# Patient Record
Sex: Male | Born: 1940
Health system: Southern US, Community
[De-identification: ages and names within clinical notes are randomized; demographics above are authoritative.]

## PROBLEM LIST (undated history)

## (undated) DIAGNOSIS — I10 Essential (primary) hypertension: Secondary | ICD-10-CM

## (undated) DIAGNOSIS — E119 Type 2 diabetes mellitus without complications: Secondary | ICD-10-CM

## (undated) DIAGNOSIS — H269 Unspecified cataract: Secondary | ICD-10-CM

## (undated) DIAGNOSIS — N529 Male erectile dysfunction, unspecified: Secondary | ICD-10-CM

## (undated) DIAGNOSIS — I447 Left bundle-branch block, unspecified: Secondary | ICD-10-CM

## (undated) DIAGNOSIS — Z87442 Personal history of urinary calculi: Secondary | ICD-10-CM

## (undated) DIAGNOSIS — E559 Vitamin D deficiency, unspecified: Secondary | ICD-10-CM

## (undated) DIAGNOSIS — R911 Solitary pulmonary nodule: Secondary | ICD-10-CM

## (undated) DIAGNOSIS — K219 Gastro-esophageal reflux disease without esophagitis: Secondary | ICD-10-CM

## (undated) DIAGNOSIS — N183 Chronic kidney disease, stage 3 unspecified: Secondary | ICD-10-CM

## (undated) DIAGNOSIS — C801 Malignant (primary) neoplasm, unspecified: Secondary | ICD-10-CM

## (undated) DIAGNOSIS — R7303 Prediabetes: Secondary | ICD-10-CM

## (undated) DIAGNOSIS — K589 Irritable bowel syndrome without diarrhea: Secondary | ICD-10-CM

## (undated) DIAGNOSIS — E785 Hyperlipidemia, unspecified: Secondary | ICD-10-CM

## (undated) DIAGNOSIS — N400 Enlarged prostate without lower urinary tract symptoms: Secondary | ICD-10-CM

## (undated) HISTORY — DX: Left bundle-branch block, unspecified: I44.7

## (undated) HISTORY — DX: Male erectile dysfunction, unspecified: N52.9

## (undated) HISTORY — DX: Hyperlipidemia, unspecified: E78.5

## (undated) HISTORY — DX: Unspecified cataract: H26.9

## (undated) HISTORY — DX: Vitamin D deficiency, unspecified: E55.9

## (undated) HISTORY — DX: Irritable bowel syndrome, unspecified: K58.9

## (undated) HISTORY — DX: Benign prostatic hyperplasia without lower urinary tract symptoms: N40.0

## (undated) HISTORY — DX: Personal history of urinary calculi: Z87.442

## (undated) HISTORY — DX: Type 2 diabetes mellitus without complications: E11.9

## (undated) HISTORY — DX: Gastro-esophageal reflux disease without esophagitis: K21.9

## (undated) HISTORY — DX: Prediabetes: R73.03

## (undated) HISTORY — DX: Essential (primary) hypertension: I10

## (undated) HISTORY — PX: POLYPECTOMY: SHX149

## (undated) HISTORY — PX: KIDNEY STONE SURGERY: SHX686

---

## 1999-07-19 ENCOUNTER — Other Ambulatory Visit: Admission: RE | Admit: 1999-07-19 | Discharge: 1999-07-19 | Payer: Self-pay | Admitting: Gastroenterology

## 1999-07-19 ENCOUNTER — Encounter (INDEPENDENT_AMBULATORY_CARE_PROVIDER_SITE_OTHER): Payer: Self-pay

## 2001-06-27 ENCOUNTER — Encounter: Payer: Self-pay | Admitting: Internal Medicine

## 2001-06-27 ENCOUNTER — Ambulatory Visit (HOSPITAL_COMMUNITY): Admission: RE | Admit: 2001-06-27 | Discharge: 2001-06-27 | Payer: Self-pay | Admitting: *Deleted

## 2002-02-09 ENCOUNTER — Observation Stay (HOSPITAL_COMMUNITY): Admission: RE | Admit: 2002-02-09 | Discharge: 2002-02-10 | Payer: Self-pay | Admitting: Urology

## 2002-02-09 ENCOUNTER — Encounter: Payer: Self-pay | Admitting: Urology

## 2002-02-10 ENCOUNTER — Encounter: Payer: Self-pay | Admitting: Urology

## 2002-02-12 ENCOUNTER — Encounter: Payer: Self-pay | Admitting: Urology

## 2002-02-12 ENCOUNTER — Encounter: Admission: RE | Admit: 2002-02-12 | Discharge: 2002-02-12 | Payer: Self-pay | Admitting: Urology

## 2002-03-04 ENCOUNTER — Encounter: Payer: Self-pay | Admitting: Urology

## 2002-03-04 ENCOUNTER — Ambulatory Visit (HOSPITAL_BASED_OUTPATIENT_CLINIC_OR_DEPARTMENT_OTHER): Admission: RE | Admit: 2002-03-04 | Discharge: 2002-03-04 | Payer: Self-pay | Admitting: Urology

## 2002-03-19 ENCOUNTER — Encounter: Payer: Self-pay | Admitting: Urology

## 2002-03-19 ENCOUNTER — Encounter: Admission: RE | Admit: 2002-03-19 | Discharge: 2002-03-19 | Payer: Self-pay | Admitting: Urology

## 2009-10-15 ENCOUNTER — Ambulatory Visit (HOSPITAL_COMMUNITY): Admission: RE | Admit: 2009-10-15 | Discharge: 2009-10-15 | Payer: Self-pay | Admitting: Internal Medicine

## 2011-01-21 IMAGING — CR DG HAND COMPLETE 3+V*L*
3 series · 3 of 3 positions shown · non-contrast
Comparison: None

CLINICAL DATA: And the smash between to projects 4 days ago with
pain, bruising and swelling over the second fourth metacarpals
posteriorly.

LEFT HAND - COMPLETE 3+ VIEW

[view not recorded (1 of 3)]
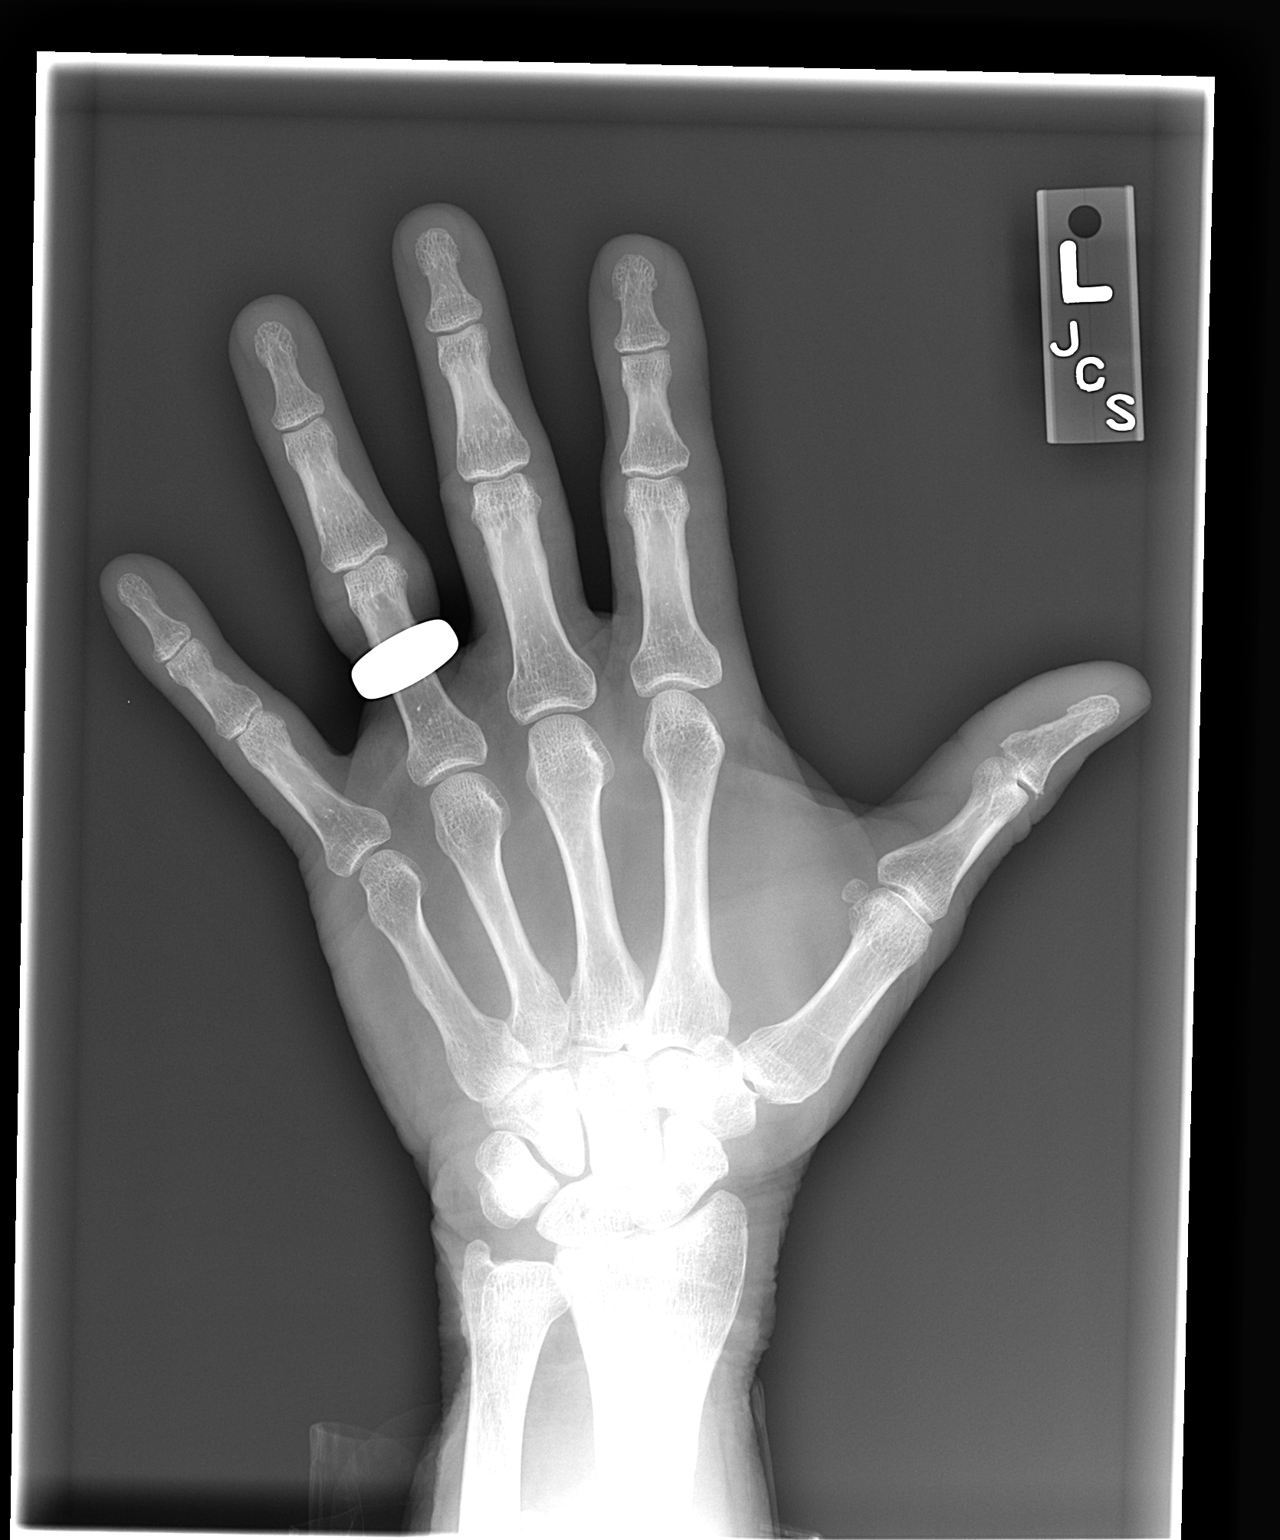

[view not recorded (2 of 3)]
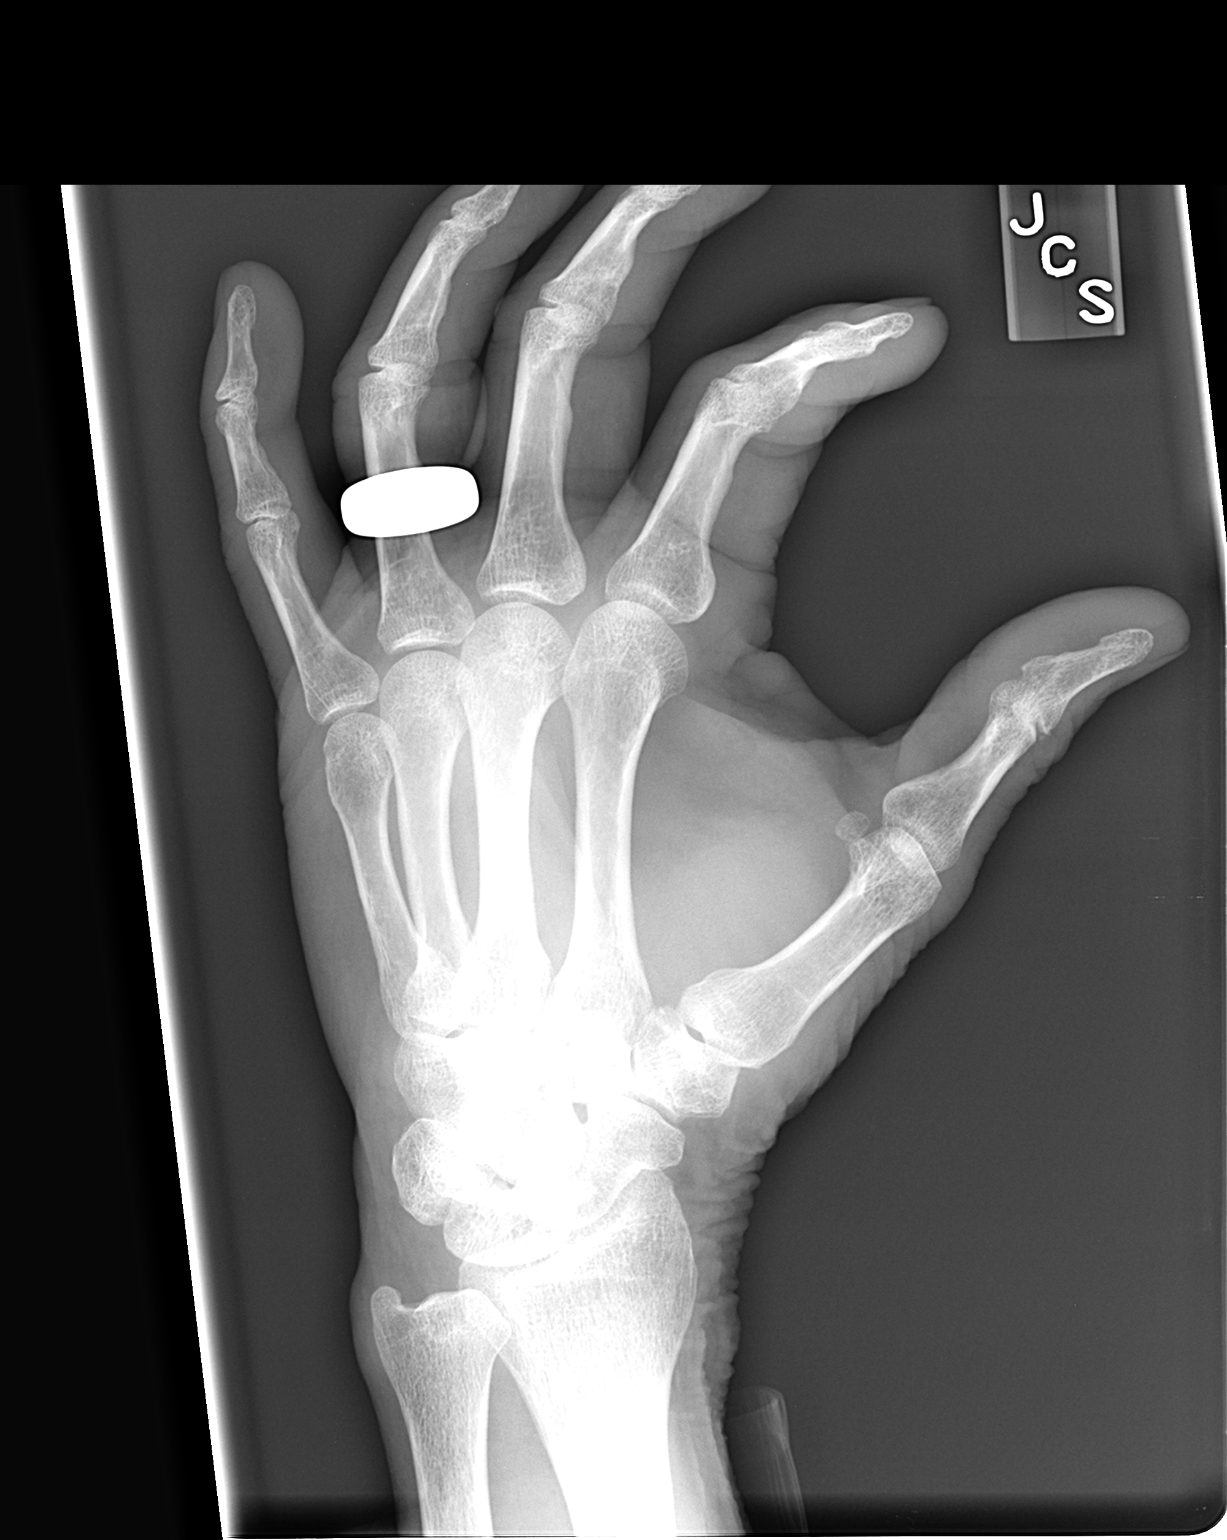

[view not recorded (3 of 3)]
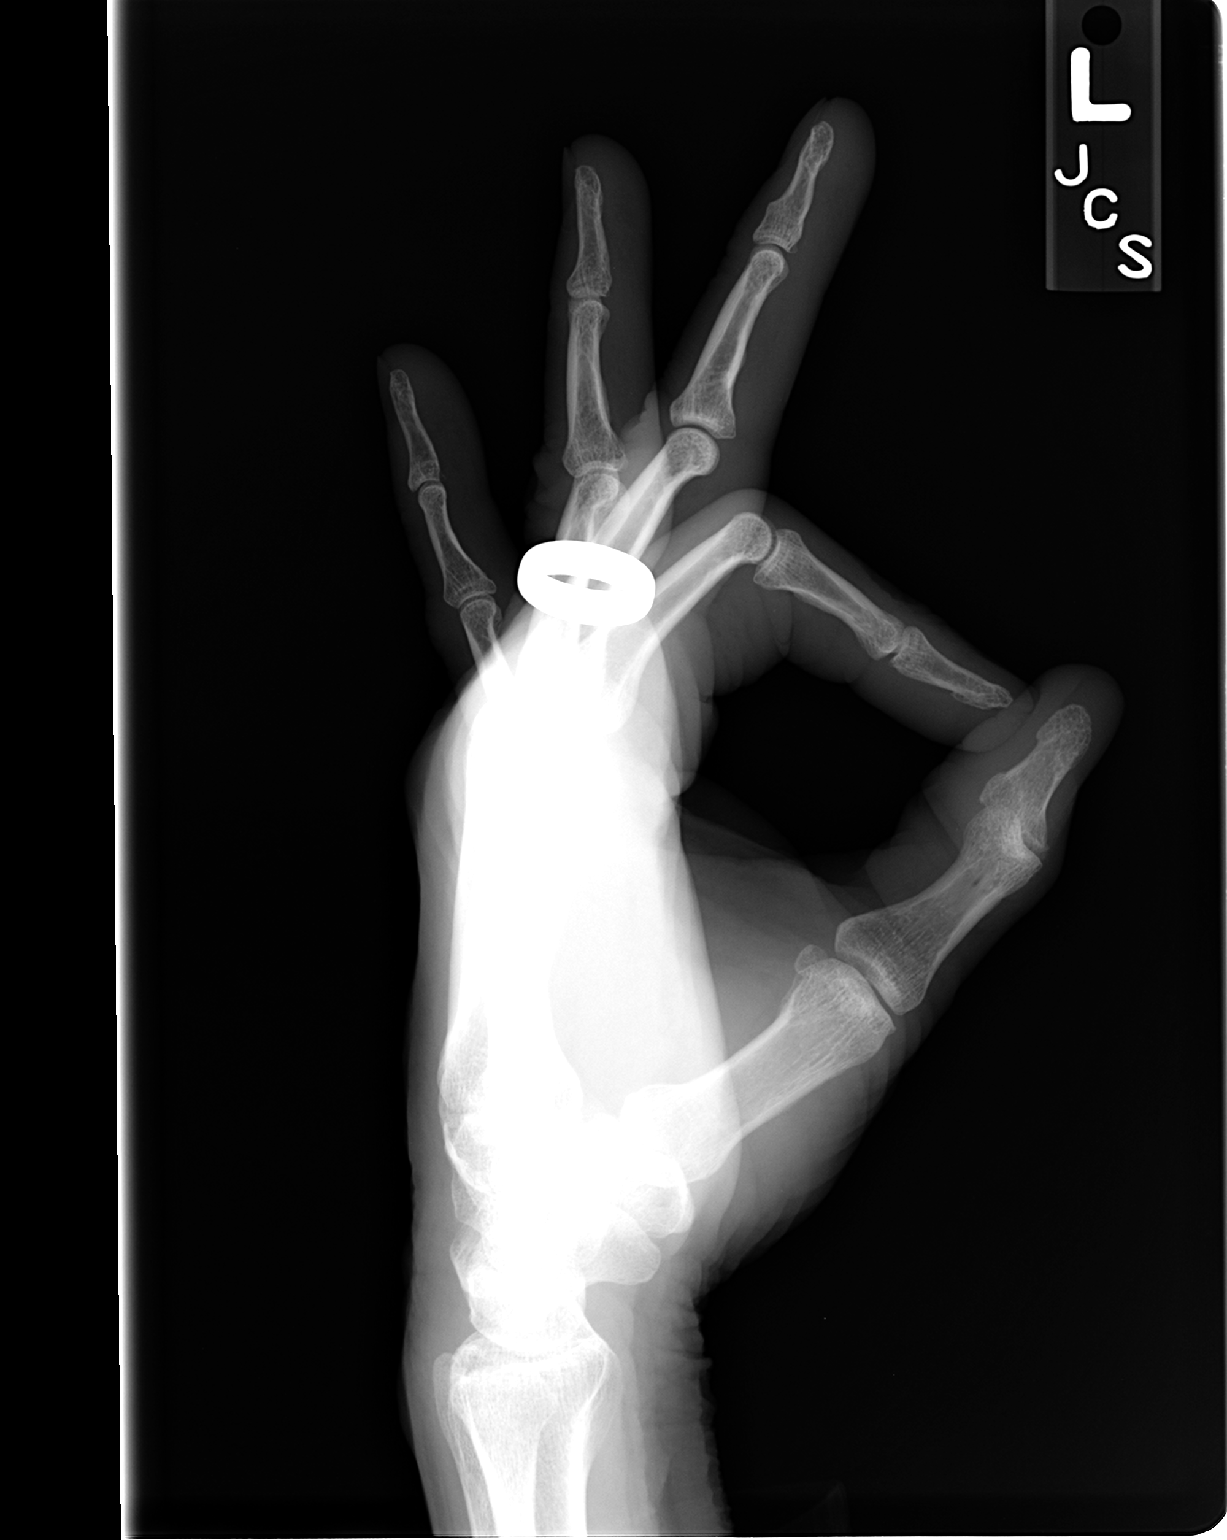

[3 of 3 positions shown; findings below may reference images not displayed]

FINDINGS: Bone density is within normal limits.  A ring is present
on the ring finger and was unable to be removed for the x-ray due
to the swelling of the digit.  No acute fracture or dislocation is
noted.  There is some diffuse digit swelling identified of the
second through fourth digits.  Joint spaces appear maintained.
Soft tissue planes are otherwise maintained.
IMPRESSION: Soft tissue swelling with no acute fracture or dislocation
apparent.

## 2011-01-28 NOTE — Op Note (Signed)
[facility]  Patient:    [name], [name] Visit Number: [id number] MRN: [id number]          Service Type: Attending:  Vonzell Schlatter. Patsi Sears, M.D. Dictated by:   Vonzell Schlatter Patsi Sears, M.D. Proc. Date: 02/09/02   CC:         Marinus Maw, M.D.   Operative Report  PREOPERATIVE DIAGNOSIS:  Left upper ureteral stone.  POSTOPERATIVE DIAGNOSIS:  Left upper ureteral stone.  OPERATION: 1. Cystourethroscopy. 2. Left retrograde pyelogram. 3. Left double-J catheter.  SURGEON:  Sigmund I. Patsi Sears, M.D.  ANESTHESIA:  General (LMA).  PREPARATION:  After appropriate preanesthesia, the patient is brought to the operating room and placed upon the operating table in the dorsal supine position where general LMA anesthesia was introduced.  The patient was then re-placed in the dorsal lithotomy position where the pubis was prepped with Betadine solution and draped in the usual fashion.  REVIEW OF HISTORY:  [name] is a 70 year old, married, white male, who awoke at 3:30 this morning with left flank pain and left lower quadrant pain. He has had nausea, vomiting, and chills.  He has no family history of stones, no gross hematuria.  The patient was seen at [facility] with CT scan showing a 7 mm left upper ureteral calculus with impaction and mild hydronephrosis above it. We have evaluated the possibility of observation at home, observation in the hospital, double-J catheter, with potential lithotripsy in the future.  The patient desires to have double-J catheter placed today, and then have lithotripsy in the future if possible.  DESCRIPTION OF PROCEDURE:  Cystourethroscopy was accomplished and shows trilobar BPH with trabeculation and early cellular formation.  The ureteral orifices are very distal, very close to the bladder neck, and left retrograde pyelogram could not be adequately performed because of severe tortuosity of the  ureters.  Therefore, the floppy tip guidewire was passed through the ureteral orifice and into the level of the kidney.  A double-J catheter was then passed over this into the kidney and, again, attempts at left retrograde pyelogram were performed but could not be accomplished.  Retrograde pyelogram is performed with the wire in the kidney, but I do not see a definite stone, as noted on CT scan.  Therefore, the patient will need follow-up with noncontrast helical CT for identification of stone.  The bladder was drained of fluid.  The patient was given B&O suppository at the beginning of the case, and IV Toradol at the end of the case.  He was awakened and taken to the recovery room in good condition. Dictated by:   Vonzell Schlatter Patsi Sears, M.D. Attending:  Vonzell Schlatter. Patsi Sears, M.D. DD:  02/09/02 TD:  02/09/02 Job: [id number]

## 2011-02-09 ENCOUNTER — Other Ambulatory Visit: Payer: Self-pay | Admitting: Gastroenterology

## 2011-06-27 ENCOUNTER — Other Ambulatory Visit: Payer: Self-pay | Admitting: Dermatology

## 2011-09-13 HISTORY — PX: CATARACT EXTRACTION W/ INTRAOCULAR LENS IMPLANT: SHX1309

## 2011-09-13 HISTORY — PX: COLONOSCOPY: SHX174

## 2012-01-25 ENCOUNTER — Encounter: Payer: Self-pay | Admitting: Gastroenterology

## 2012-02-27 ENCOUNTER — Ambulatory Visit (AMBULATORY_SURGERY_CENTER): Payer: Medicare Other | Admitting: *Deleted

## 2012-02-27 ENCOUNTER — Encounter: Payer: Self-pay | Admitting: Gastroenterology

## 2012-02-27 VITALS — Ht 69.0 in | Wt 176.0 lb

## 2012-02-27 DIAGNOSIS — Z1211 Encounter for screening for malignant neoplasm of colon: Secondary | ICD-10-CM

## 2012-02-27 MED ORDER — MOVIPREP 100 G PO SOLR: ORAL | Status: DC

## 2012-03-01 LAB — HM COLONOSCOPY

## 2012-03-05 ENCOUNTER — Encounter: Payer: Self-pay | Admitting: Gastroenterology

## 2012-03-23 ENCOUNTER — Ambulatory Visit (AMBULATORY_SURGERY_CENTER): Payer: Medicare Other | Admitting: Gastroenterology

## 2012-03-23 ENCOUNTER — Encounter: Payer: Self-pay | Admitting: Gastroenterology

## 2012-03-23 VITALS — BP 131/76 | HR 46 | Temp 95.3°F | Resp 18 | Ht 69.0 in | Wt 176.0 lb

## 2012-03-23 DIAGNOSIS — D126 Benign neoplasm of colon, unspecified: Secondary | ICD-10-CM

## 2012-03-23 DIAGNOSIS — K635 Polyp of colon: Secondary | ICD-10-CM

## 2012-03-23 DIAGNOSIS — Z1211 Encounter for screening for malignant neoplasm of colon: Secondary | ICD-10-CM

## 2012-03-23 DIAGNOSIS — K5730 Diverticulosis of large intestine without perforation or abscess without bleeding: Secondary | ICD-10-CM

## 2012-03-23 MED ORDER — SODIUM CHLORIDE 0.9 % IV SOLN: 500.0000 mL | INTRAVENOUS | Status: DC

## 2012-03-23 NOTE — Op Note (Signed)
[facility] [address] [place], [place]  [address]  COLONOSCOPY PROCEDURE REPORT  PATIENT:  Edward, [name]  MR#:  [id number] BIRTHDATE:  Apr 08, 1941, 71 yrs. old  GENDER:  male ENDOSCOPIST:  Barbette Hair. Arlyce Dice, MD REF. BY:  Lucky Cowboy, M.D. PROCEDURE DATE:  03/23/2012 PROCEDURE:  Colonoscopy with snare polypectomy, Colon with cold biopsy polypectomy ASA CLASS:  Class II INDICATIONS:  Routine Risk Screening MEDICATIONS:   MAC sedation, administered by CRNA mg IV propofol 60  DESCRIPTION OF PROCEDURE:   After the risks benefits and alternatives of the procedure were thoroughly explained, informed consent was obtained.  Digital rectal exam was performed and revealed no abnormalities.   The LB CF-H180AL [id number] endoscope was introduced through the anus and advanced to the cecum, which was identified by both the appendix and ileocecal valve, without limitations.  The quality of the prep was excellent, using MiraLax.  The instrument was then slowly withdrawn as the colon was fully examined. <<PROCEDUREIMAGES>>  FINDINGS:  A sessile polyp was found in the distal transverse colon. It was 3 mm in size. Polyp was snared without cautery. Retrieval was successful (see image2). snare polyp  A diminutive polyp was found in the rectum. It was 1 mm in size. It was found 3 cm from the point of entry. The polyp was removed using cold biopsy forceps (see image5).  Moderate diverticulosis was found in the sigmoid colon (see image4).  This was otherwise a normal examination of the colon (see image1 and image6).   Retroflexed views in the rectum revealed no abnormalities.    The time to cecum =  1) 3.50  minutes. The scope was then withdrawn in  1) 9.0 minutes from the cecum and the procedure completed. COMPLICATIONS:  None ENDOSCOPIC IMPRESSION: 1) 3 mm sessile polyp in the distal transverse colon 2) 1 mm diminutive polyp in the rectum 3) Moderate diverticulosis in the sigmoid  colon 4) Otherwise normal examination RECOMMENDATIONS: 1) If the polyp(s) removed today are proven to be adenomatous (pre-cancerous) polyps, you will need a repeat colonoscopy in 5 years. Otherwise you should continue to follow colorectal cancer screening guidelines for "routine risk" patients with colonoscopy in 10 years. You will receive a letter within 1-2 weeks with the results of your biopsy as well as final recommendations. Please call my office if you have not received a letter after 3 weeks. REPEAT EXAM:  You will receive a letter from Dr. Arlyce Dice in 1-2 weeks, after reviewing the final pathology, with followup recommendations.  ______________________________ Barbette Hair Arlyce Dice, MD  CC:  n. eSIGNED:   Barbette Hair. Allani Reber at 03/23/2012 02:58 PM  [place]enn[place], Edward [name], Edward [name]

## 2012-03-23 NOTE — Patient Instructions (Addendum)

## 2012-03-23 NOTE — Progress Notes (Signed)
Dr. Arlyce Dice advised that pt. Has passed gas via rectal tube and feels a little better.  He advised that if pt. Was passing gas he  Could bed discharged.Patient did not experience any of the following events: a burn prior to discharge; a fall within the facility; wrong site/side/patient/procedure/implant event; or a hospital transfer or hospital admission upon discharge from the facility. [phone]) Patient did not have preoperative order for IV antibiotic SSI prophylaxis. [phone])

## 2012-03-26 ENCOUNTER — Telehealth: Payer: Self-pay | Admitting: *Deleted

## 2012-03-26 NOTE — Telephone Encounter (Signed)
NO ANSWER, MESSAGE LEFT FOR THE PATIENT. 

## 2012-04-02 ENCOUNTER — Encounter: Payer: Self-pay | Admitting: Gastroenterology

## 2012-11-05 ENCOUNTER — Ambulatory Visit (HOSPITAL_COMMUNITY)
Admission: RE | Admit: 2012-11-05 | Discharge: 2012-11-05 | Disposition: A | Discharge status: Home / Self Care | Payer: Medicare Other | Source: Ambulatory Visit | Attending: Internal Medicine | Admitting: Internal Medicine

## 2012-11-05 ENCOUNTER — Other Ambulatory Visit (HOSPITAL_COMMUNITY): Payer: Self-pay | Admitting: Internal Medicine

## 2012-11-05 DIAGNOSIS — M25469 Effusion, unspecified knee: Secondary | ICD-10-CM | POA: Insufficient documentation

## 2012-11-05 DIAGNOSIS — M25569 Pain in unspecified knee: Secondary | ICD-10-CM | POA: Insufficient documentation

## 2012-11-05 DIAGNOSIS — M25562 Pain in left knee: Secondary | ICD-10-CM

## 2013-07-30 ENCOUNTER — Other Ambulatory Visit: Payer: Self-pay | Admitting: Physician Assistant

## 2013-08-01 ENCOUNTER — Encounter: Payer: Self-pay | Admitting: Physician Assistant

## 2013-08-01 DIAGNOSIS — N138 Other obstructive and reflux uropathy: Secondary | ICD-10-CM | POA: Insufficient documentation

## 2013-08-01 DIAGNOSIS — R7303 Prediabetes: Secondary | ICD-10-CM

## 2013-08-01 DIAGNOSIS — E1169 Type 2 diabetes mellitus with other specified complication: Secondary | ICD-10-CM | POA: Insufficient documentation

## 2013-08-01 DIAGNOSIS — E782 Mixed hyperlipidemia: Secondary | ICD-10-CM | POA: Insufficient documentation

## 2013-08-01 DIAGNOSIS — N529 Male erectile dysfunction, unspecified: Secondary | ICD-10-CM

## 2013-08-01 DIAGNOSIS — I10 Essential (primary) hypertension: Secondary | ICD-10-CM | POA: Insufficient documentation

## 2013-08-01 DIAGNOSIS — E559 Vitamin D deficiency, unspecified: Secondary | ICD-10-CM | POA: Insufficient documentation

## 2013-08-01 DIAGNOSIS — E785 Hyperlipidemia, unspecified: Secondary | ICD-10-CM

## 2013-08-01 DIAGNOSIS — K219 Gastro-esophageal reflux disease without esophagitis: Secondary | ICD-10-CM | POA: Insufficient documentation

## 2013-08-01 DIAGNOSIS — N400 Enlarged prostate without lower urinary tract symptoms: Secondary | ICD-10-CM

## 2013-08-02 ENCOUNTER — Ambulatory Visit: Payer: Self-pay | Admitting: Physician Assistant

## 2013-08-02 ENCOUNTER — Encounter: Payer: Self-pay | Admitting: Physician Assistant

## 2013-08-02 VITALS — BP 138/80 | HR 56 | Temp 98.9°F | Resp 16 | Ht 69.0 in | Wt 176.0 lb

## 2013-08-02 DIAGNOSIS — K219 Gastro-esophageal reflux disease without esophagitis: Secondary | ICD-10-CM

## 2013-08-02 DIAGNOSIS — E559 Vitamin D deficiency, unspecified: Secondary | ICD-10-CM

## 2013-08-02 DIAGNOSIS — E785 Hyperlipidemia, unspecified: Secondary | ICD-10-CM

## 2013-08-02 DIAGNOSIS — Z79899 Other long term (current) drug therapy: Secondary | ICD-10-CM

## 2013-08-02 DIAGNOSIS — I10 Essential (primary) hypertension: Secondary | ICD-10-CM

## 2013-08-02 DIAGNOSIS — N529 Male erectile dysfunction, unspecified: Secondary | ICD-10-CM

## 2013-08-02 DIAGNOSIS — R7303 Prediabetes: Secondary | ICD-10-CM

## 2013-08-02 DIAGNOSIS — N400 Enlarged prostate without lower urinary tract symptoms: Secondary | ICD-10-CM

## 2013-08-02 LAB — HEPATIC FUNCTION PANEL
ALT: 11 U/L (ref 0–53)
AST: 10 U/L (ref 0–37)
Albumin: 4 g/dL (ref 3.5–5.2)
Total Bilirubin: 0.9 mg/dL (ref 0.3–1.2)
Total Protein: 6.2 g/dL (ref 6.0–8.3)

## 2013-08-02 LAB — CBC WITH DIFFERENTIAL/PLATELET
Basophils Absolute: 0 10*3/uL (ref 0.0–0.1)
Basophils Relative: 0 % (ref 0–1)
HCT: 45.1 % (ref 39.0–52.0)
MCV: 87.9 fL (ref 78.0–100.0)
Monocytes Absolute: 0.3 10*3/uL (ref 0.1–1.0)
Neutrophils Relative %: 60 % (ref 43–77)
RBC: 5.13 MIL/uL (ref 4.22–5.81)
WBC: 3.9 10*3/uL — ABNORMAL LOW (ref 4.0–10.5)

## 2013-08-02 LAB — BASIC METABOLIC PANEL WITH GFR
Chloride: 105 mEq/L (ref 96–112)
Creat: 0.98 mg/dL (ref 0.50–1.35)
GFR, Est Non African American: 77 mL/min
Glucose, Bld: 115 mg/dL — ABNORMAL HIGH (ref 70–99)
Potassium: 4.5 mEq/L (ref 3.5–5.3)
Sodium: 142 mEq/L (ref 135–145)

## 2013-08-02 LAB — LIPID PANEL
Cholesterol: 149 mg/dL (ref 0–200)
VLDL: 10 mg/dL (ref 0–40)

## 2013-08-02 LAB — HEMOGLOBIN A1C: Mean Plasma Glucose: 134 mg/dL — ABNORMAL HIGH (ref <117)

## 2013-08-02 MED ORDER — TAMSULOSIN HCL 0.4 MG PO CAPS: 0.4000 mg | ORAL_CAPSULE | Freq: Every day | ORAL | Status: DC

## 2013-08-02 NOTE — Progress Notes (Signed)
HPI Patient presents for 6 month follow up with hypertension, hyperlipidemia, prediabetes and vitamin D. Patient's blood pressure has been controlled at home. Patient denies chest pain, shortness of breath, dizziness.  Patient's cholesterol is diet controlled. In addition they are on pravachol and denies myalgias. The cholesterol last visit was LDL 82 . The patient has been working on diet and exercise for prediabetes, and denies changes in vision, polys, and paresthesias.  A1C 6.1 Patient is on Vitamin D supplement.  92 He had a very low b12 at 242 and added sublingual B12 Current Medications:  Current Outpatient Prescriptions on File Prior to Visit  Medication Sig Dispense Refill  . aspirin 81 MG tablet Take 81 mg by mouth daily.      . Cholecalciferol (VITAMIN D PO) Take 5,000 Units by mouth daily.      Marland Kitchen esomeprazole (NEXIUM) 40 MG capsule Take 40 mg by mouth daily before breakfast.      . Omega-3 Fatty Acids (FISH OIL) 1000 MG CAPS Take 1 capsule by mouth daily.      . pravastatin (PRAVACHOL) 40 MG tablet Take 40 mg by mouth daily.      . Tamsulosin HCl (FLOMAX) 0.4 MG CAPS Take 0.4 mg by mouth daily.       No current facility-administered medications on file prior to visit.   Medical History:  Past Medical History  Diagnosis Date  . Personal history of kidney stones   . Hyperlipidemia   . GERD (gastroesophageal reflux disease)   . Hypertension   . LBBB (left bundle branch block)   . Prediabetes   . IBS (irritable bowel syndrome)   . ED (erectile dysfunction)   . Vitamin D deficiency   . BPH (benign prostatic hyperplasia)    Allergies:  Allergies  Allergen Reactions  . Bystolic [Nebivolol Hcl]     ROS Constitutional: Denies fever, chills, weight loss/gain, headaches, insomnia, fatigue, night sweats, and change in appetite. Eyes: Denies redness, blurred vision, diplopia, discharge, itchy, watery eyes.  ENT: Denies discharge, congestion, post nasal drip, sore throat,  earache, dental pain, Tinnitus, Vertigo, Sinus pain, snoring.  Cardio: Denies chest pain, palpitations, irregular heartbeat,  dyspnea, diaphoresis, orthopnea, PND, claudication, edema Respiratory: denies cough, dyspnea,pleurisy, hoarseness, wheezing.  Gastrointestinal: Denies dysphagia, heartburn,  water brash, pain, cramps, nausea, vomiting, bloating, diarrhea, constipation, hematemesis, melena, hematochezia,  hemorrhoids Genitourinary: Denies dysuria, frequency, urgency, nocturia, hesitancy, discharge, hematuria, flank pain Musculoskeletal: Denies arthralgia, myalgia, stiffness, Jt. Swelling, pain, limp, and strain/sprain. Skin: Denies pruritis, rash, hives, warts, acne, eczema, changing in skin lesion Neuro: Weakness, tremor, incoordination, spasms, paresthesia, pain Psychiatric: Denies confusion, memory loss, sensory loss Endocrine: Denies change in weight, skin, hair change, nocturia, and paresthesia, Diabetic Polys, visual blurring, hyper /hypo glycemic episodes.  Heme/Lymph: Excessive bleeding, bruising, enlarged lymph nodes  Family history- Review and unchanged Social history- Review and unchanged Physical Exam: There were no vitals filed for this visit. There were no vitals filed for this visit. General Appearance: Well nourished, in no apparent distress. Eyes: PERRLA, EOMs, conjunctiva no swelling or erythema, normal fundi and vessels. Sinuses: No Frontal/maxillary tenderness ENT/Mouth: Ext aud canals clear, with TMs without erythema, bulging.No erythema, swelling, or exudate on post pharynx.  Tonsils not swollen or erythematous. Hearing normal.  Neck: Supple, thyroid normal.  Respiratory: Respiratory effort normal, BS equal bilaterally without rales, rhonchi, wheezing or stridor.  Cardio: Heart sounds normal, regular rate and rhythm without murmurs, rubs or gallops. Peripheral pulses brisk and equal bilaterally, without edema.  Abdomen:  Flat, soft, with bowel sounds. Non tender, no  guarding, rebound, hernias, masses, or organomegaly.  Lymphatics: Non tender without lymphadenopathy.  Musculoskeletal: Full ROM all peripheral extremities, joint stability, 5/5 strength, and normal gait. Skin: Warm, dry without rashes, lesions, ecchymosis.  Neuro: Cranial nerves intact, reflexes equal bilaterally. Normal muscle tone, no cerebellar symptoms. Sensation intact.  Psych: Awake and oriented X 3, normal affect, Insight and Judgment appropriate.   Assessment and Plan:  Hypertension: Continue medication, monitor blood pressure at home. Continue DASH diet. Cholesterol: Continue diet and exercise. Check cholesterol.  Pre-diabetes-Continue diet and exercise. Check A1C Vitamin D Def- check level and continue medications.   Quentin Mulling 9:48 AM

## 2013-08-03 LAB — VITAMIN D 25 HYDROXY (VIT D DEFICIENCY, FRACTURES): Vit D, 25-Hydroxy: 94 ng/mL — ABNORMAL HIGH (ref 30–89)

## 2013-08-03 LAB — INSULIN, FASTING: Insulin fasting, serum: 9 u[IU]/mL (ref 3–28)

## 2013-08-26 ENCOUNTER — Other Ambulatory Visit: Payer: Self-pay | Admitting: Internal Medicine

## 2013-09-12 HISTORY — PX: CATARACT EXTRACTION: SUR2

## 2013-11-04 ENCOUNTER — Other Ambulatory Visit: Payer: Self-pay

## 2013-11-04 MED ORDER — TAMSULOSIN HCL 0.4 MG PO CAPS: 0.4000 mg | ORAL_CAPSULE | Freq: Every day | ORAL | Status: DC

## 2013-11-04 MED ORDER — PRAVASTATIN SODIUM 40 MG PO TABS: 40.0000 mg | ORAL_TABLET | Freq: Every day | ORAL | Status: DC

## 2013-11-04 MED ORDER — ESOMEPRAZOLE MAGNESIUM 40 MG PO CPDR: 40.0000 mg | DELAYED_RELEASE_CAPSULE | Freq: Every day | ORAL | Status: DC

## 2013-11-04 MED ORDER — PANTOPRAZOLE SODIUM 40 MG PO TBEC: 40.0000 mg | DELAYED_RELEASE_TABLET | Freq: Every day | ORAL | Status: DC

## 2014-02-14 ENCOUNTER — Encounter: Payer: Self-pay | Admitting: Internal Medicine

## 2014-04-08 ENCOUNTER — Encounter: Payer: Self-pay | Admitting: Internal Medicine

## 2014-04-08 ENCOUNTER — Ambulatory Visit (INDEPENDENT_AMBULATORY_CARE_PROVIDER_SITE_OTHER): Payer: Commercial Managed Care - HMO | Admitting: Internal Medicine

## 2014-04-08 VITALS — BP 134/78 | HR 56 | Temp 97.9°F | Resp 16 | Ht 68.75 in | Wt 179.0 lb

## 2014-04-08 DIAGNOSIS — Z789 Other specified health status: Secondary | ICD-10-CM

## 2014-04-08 DIAGNOSIS — Z1331 Encounter for screening for depression: Secondary | ICD-10-CM

## 2014-04-08 DIAGNOSIS — Z0000 Encounter for general adult medical examination without abnormal findings: Secondary | ICD-10-CM

## 2014-04-08 DIAGNOSIS — Z1212 Encounter for screening for malignant neoplasm of rectum: Secondary | ICD-10-CM

## 2014-04-08 DIAGNOSIS — E559 Vitamin D deficiency, unspecified: Secondary | ICD-10-CM

## 2014-04-08 DIAGNOSIS — R7303 Prediabetes: Secondary | ICD-10-CM

## 2014-04-08 DIAGNOSIS — E785 Hyperlipidemia, unspecified: Secondary | ICD-10-CM

## 2014-04-08 DIAGNOSIS — Z23 Encounter for immunization: Secondary | ICD-10-CM

## 2014-04-08 DIAGNOSIS — Z125 Encounter for screening for malignant neoplasm of prostate: Secondary | ICD-10-CM

## 2014-04-08 DIAGNOSIS — Z79899 Other long term (current) drug therapy: Secondary | ICD-10-CM

## 2014-04-08 DIAGNOSIS — I10 Essential (primary) hypertension: Secondary | ICD-10-CM

## 2014-04-08 LAB — CBC WITH DIFFERENTIAL/PLATELET
Basophils Absolute: 0 K/uL (ref 0.0–0.1)
Basophils Relative: 0 % (ref 0–1)
Eosinophils Absolute: 0.1 K/uL (ref 0.0–0.7)
Eosinophils Relative: 2 % (ref 0–5)
HCT: 45.5 % (ref 39.0–52.0)
Hemoglobin: 15.6 g/dL (ref 13.0–17.0)
Lymphocytes Relative: 28 % (ref 12–46)
Lymphs Abs: 1.1 K/uL (ref 0.7–4.0)
MCH: 30 pg (ref 26.0–34.0)
MCHC: 34.3 g/dL (ref 30.0–36.0)
MCV: 87.5 fL (ref 78.0–100.0)
Monocytes Absolute: 0.4 K/uL (ref 0.1–1.0)
Monocytes Relative: 10 % (ref 3–12)
Neutro Abs: 2.5 K/uL (ref 1.7–7.7)
Neutrophils Relative %: 60 % (ref 43–77)
Platelets: 125 K/uL — ABNORMAL LOW (ref 150–400)
RBC: 5.2 MIL/uL (ref 4.22–5.81)
RDW: 14.1 % (ref 11.5–15.5)
WBC: 4.1 K/uL (ref 4.0–10.5)

## 2014-04-08 LAB — HEMOGLOBIN A1C
Hgb A1c MFr Bld: 6.5 % — ABNORMAL HIGH
Mean Plasma Glucose: 140 mg/dL — ABNORMAL HIGH

## 2014-04-08 NOTE — Patient Instructions (Signed)

## 2014-04-08 NOTE — Progress Notes (Signed)
Patient ID: Edward [name], male   DOB: Mar 02, 1941, 73 y.o.   MRN: [id number]    Annual Screening Comprehensive Examination  This very nice 73 y.o.MWM presents for complete physical.  Patient has been followed for HTN, Diabetes  Prediabetes, Hyperlipidemia, and Vitamin D Deficiency.   HTN predates since 2010 and he self d/d'd his Edward [name] & has been monitored expectantly since. Patient's BP has been controlled and today's BP: 134/78 mmHg. Patient denies any cardiac symptoms as chest pain, palpitations, shortness of breath, dizziness or ankle swelling.   Patient's hyperlipidemia is controlled with diet and medications. Patient denies myalgias or other medication SE's. Last lipids were at goal. Lab Results  Component Value Date   CHOL 149 08/02/2013   HDL 50 08/02/2013   LDLCALC 89 08/02/2013   TRIG 52 08/02/2013   CHOLHDL 3.0 08/02/2013    Patient has prediabetes since 2011 with A1c of 6.8% and he has managed this with diet and his last A1c was 6.3% in Nov 2014. Patient denies reactive hypoglycemic symptoms, visual blurring, diabetic polys or paresthesias.    Finally, patient has history of Vitamin D Deficiency of  29 in 2008 and last vitamin D was 94 in Nov 2014. Patient supplements Vit D w/o SE's.   Medication Sig  . aspirin 81 MG tablet Take 81 mg by mouth daily.  Edward [name] D Take 5,000 Units by mouth daily.  Marland Kitchen esomeprazole  40 MG capsule Take 1 capsule (40 mg total) by mouth daily before breakfast.  . FISH OIL 1000 MG CAPS Take 1 capsule by mouth daily.  . pantoprazole  40 MG tablet Take 1 tablet (40 mg total) by mouth daily.  . pravastatin  40 MG tablet Take 1 tablet (40 mg total) by mouth daily.  . tamsulosin 0.4 MG CAPS capsule Take 1 capsule (0.4 mg total) by mouth daily.   Allergies  Allergen Reactions  . Edward [name] [Edward [name]]    Past Medical History  Diagnosis Date  . Personal history of kidney stones   . Hyperlipidemia   . GERD (gastroesophageal reflux disease)    . Hypertension   . LBBB (left bundle branch block)   . Prediabetes   . IBS (irritable bowel syndrome)   . ED (erectile dysfunction)   . Vitamin D deficiency   . BPH (benign prostatic hyperplasia)    Past Surgical History  Procedure Laterality Date  . Cataract extraction w/ intraocular lens implant  2013    left  . Kidney stone surgery     Family History  Problem Relation Age of Onset  . Colon cancer Neg Hx   . Diabetes Sister   . Hypertension Sister   . Heart disease Mother   . Diabetes Sister   . Hypertension Sister    History   Social History  . Marital Status: Married    Spouse Name: N/A    Number of Children: N/A  . Years of Education: N/A   Occupational History  . Not on file.   Social History Main Topics  . Smoking status: Former Smoker -- 0.50 packs/day for 10 years    Quit date: 09/13/1967  . Smokeless tobacco: Never Used  . Alcohol Use: No  . Drug Use: No  . Sexual Activity: Not on file    ROS Constitutional: Denies fever, chills, weight loss/gain, headaches, insomnia, fatigue, night sweats or change in appetite. Eyes: Denies redness, blurred vision, diplopia, discharge, itchy or watery eyes.  ENT: Denies discharge, congestion, post nasal drip,  epistaxis, sore throat, earache, hearing loss, dental pain, Tinnitus, Vertigo, Sinus pain or snoring.  Cardio: Denies chest pain, palpitations, irregular heartbeat, syncope, dyspnea, diaphoresis, orthopnea, PND, claudication or edema Respiratory: denies cough, dyspnea, DOE, pleurisy, hoarseness, laryngitis or wheezing.  Gastrointestinal: Denies dysphagia, heartburn, reflux, water brash, pain, cramps, nausea, vomiting, bloating, diarrhea, constipation, hematemesis, melena, hematochezia, jaundice or hemorrhoids Genitourinary: Denies dysuria, frequency, urgency, discharge, hematuria or flank pain, has nocturia x 2-3 and hesitancy. Musculoskeletal: Denies arthralgia, myalgia, stiffness, Jt. Swelling, pain, limp or  strain/sprain. Denies Falls. Skin: Denies puritis, rash, hives, warts, acne, eczema or change in skin lesion Neuro: No weakness, tremor, incoordination, spasms, paresthesia or pain Psychiatric: Denies confusion, memory loss or sensory loss. Denies Depression. Endocrine: Denies change in weight, skin, hair change, nocturia, and paresthesia, diabetic polys, visual blurring or hyper / hypo glycemic episodes.  Heme/Lymph: No excessive bleeding, bruising or enlarged lymph nodes.  Physical Exam  BP 134/78  Pulse 56  Temp(Src) 97.9 F (36.6 C) (Temporal)  Resp 16  Ht 5' 8.75" (1.746 m)  Wt 179 lb (81.194 kg)  BMI 26.63 kg/m2  General Appearance: Well nourished, in no apparent distress. Eyes: PERRLA, EOMs, conjunctiva no swelling or erythema, normal fundi and vessels. Sinuses: No frontal/maxillary tenderness ENT/Mouth: EACs patent / TMs  nl. Nares clear without erythema, swelling, mucoid exudates. Oral hygiene is good. No erythema, swelling, or exudate. Tongue normal, non-obstructing. Tonsils not swollen or erythematous. Hearing normal.  Neck: Supple, thyroid normal. No bruits, nodes or JVD. Respiratory: Respiratory effort normal.  BS equal and clear bilateral without rales, rhonci, wheezing or stridor. Cardio: Heart sounds are normal with regular rate and rhythm and no murmurs, rubs or gallops. Peripheral pulses are normal and equal bilaterally without edema. No aortic or femoral bruits. Chest: symmetric with normal excursions and percussion.  Abdomen: Flat, soft, with bowl sounds. Nontender, no guarding, rebound, hernias, masses, or organomegaly.  Lymphatics: Non tender without lymphadenopathy.  Genitourinary: No hernias.Testes nl. DRE - prostate nl for age - smooth & firm w/o nodules. Musculoskeletal: Full ROM all peripheral extremities, joint stability, 5/5 strength, and normal gait. Skin: Warm and dry without rashes, lesions, cyanosis, clubbing or  ecchymosis.  Neuro: Cranial nerves  intact, reflexes equal bilaterally. Normal muscle tone, no cerebellar symptoms. Sensation intact.  Pysch: Awake and oriented X 3with normal affect, insight and judgment appropriate.  Assessment and Plan  1. Annual Screening Examination 2. Hypertension, Labile  3. Hyperlipidemia 4. Pre Diabetes 5. Vitamin D Deficiency 6. GERD 7. BPH/Prostatism  Continue prudent diet as discussed, weight control, BP monitoring, regular exercise, and medications as discussed.  Discussed med effects and SE's. Routine screening labs and tests as requested with regular follow-up as recommended.

## 2014-04-09 LAB — INSULIN, FASTING: INSULIN FASTING, SERUM: 9 u[IU]/mL (ref 3–28)

## 2014-04-09 LAB — BASIC METABOLIC PANEL WITH GFR
BUN: 17 mg/dL (ref 6–23)
CALCIUM: 8.8 mg/dL (ref 8.4–10.5)
CO2: 24 mEq/L (ref 19–32)
Chloride: 104 mEq/L (ref 96–112)
Creat: 1.07 mg/dL (ref 0.50–1.35)
GFR, EST AFRICAN AMERICAN: 80 mL/min
GFR, Est Non African American: 69 mL/min
Glucose, Bld: 120 mg/dL — ABNORMAL HIGH (ref 70–99)
Potassium: 4.1 mEq/L (ref 3.5–5.3)
SODIUM: 140 meq/L (ref 135–145)

## 2014-04-09 LAB — URINALYSIS, MICROSCOPIC ONLY
BACTERIA UA: NONE SEEN
Casts: NONE SEEN
Crystals: NONE SEEN
SQUAMOUS EPITHELIAL / LPF: NONE SEEN

## 2014-04-09 LAB — HEPATIC FUNCTION PANEL
ALT: 10 U/L (ref 0–53)
AST: 11 U/L (ref 0–37)
Albumin: 4.1 g/dL (ref 3.5–5.2)
Alkaline Phosphatase: 61 U/L (ref 39–117)
BILIRUBIN DIRECT: 0.2 mg/dL (ref 0.0–0.3)
BILIRUBIN TOTAL: 0.9 mg/dL (ref 0.2–1.2)
Indirect Bilirubin: 0.7 mg/dL (ref 0.2–1.2)
Total Protein: 6.1 g/dL (ref 6.0–8.3)

## 2014-04-09 LAB — MICROALBUMIN / CREATININE URINE RATIO
Creatinine, Urine: 71.2 mg/dL
MICROALB UR: 0.52 mg/dL (ref 0.00–1.89)
MICROALB/CREAT RATIO: 7.3 mg/g (ref 0.0–30.0)

## 2014-04-09 LAB — PSA: PSA: 2.05 ng/mL (ref <=4.00)

## 2014-04-09 LAB — LIPID PANEL
CHOL/HDL RATIO: 3.3 ratio
CHOLESTEROL: 149 mg/dL (ref 0–200)
HDL: 45 mg/dL (ref >39)
LDL Cholesterol: 93 mg/dL (ref 0–99)
Triglycerides: 54 mg/dL (ref <150)
VLDL: 11 mg/dL (ref 0–40)

## 2014-04-09 LAB — TSH: TSH: 1.159 u[IU]/mL (ref 0.350–4.500)

## 2014-04-09 LAB — VITAMIN D 25 HYDROXY (VIT D DEFICIENCY, FRACTURES): VIT D 25 HYDROXY: 94 ng/mL — AB (ref 30–89)

## 2014-04-09 LAB — MAGNESIUM: MAGNESIUM: 1.8 mg/dL (ref 1.5–2.5)

## 2014-04-11 ENCOUNTER — Telehealth: Payer: Self-pay

## 2014-04-11 NOTE — Telephone Encounter (Signed)
Message copied by Nadyne Coombes on Fri Apr 11, 2014  9:29 AM ------      Message from: Unk Pinto      Created: Fri Apr 11, 2014  9:04 AM       - PSA - OK      - Chol 149 - great -      - A1c 6.5% - early Diabetic - cut out sweets/candy and white stuff      - Vit D 94 - great      - all else OK ------

## 2014-04-11 NOTE — Telephone Encounter (Signed)
Left message on machine for patient to return call for lab results 

## 2014-05-12 ENCOUNTER — Other Ambulatory Visit: Payer: Self-pay | Admitting: Physician Assistant

## 2014-05-16 ENCOUNTER — Other Ambulatory Visit: Payer: Self-pay | Admitting: Internal Medicine

## 2014-05-20 ENCOUNTER — Other Ambulatory Visit: Payer: Self-pay | Admitting: *Deleted

## 2014-05-20 MED ORDER — SILDENAFIL CITRATE 20 MG PO TABS: ORAL_TABLET | ORAL | Status: DC

## 2014-06-24 ENCOUNTER — Ambulatory Visit (INDEPENDENT_AMBULATORY_CARE_PROVIDER_SITE_OTHER): Payer: Commercial Managed Care - HMO | Admitting: *Deleted

## 2014-06-24 DIAGNOSIS — Z23 Encounter for immunization: Secondary | ICD-10-CM

## 2014-07-25 ENCOUNTER — Ambulatory Visit (INDEPENDENT_AMBULATORY_CARE_PROVIDER_SITE_OTHER): Payer: Commercial Managed Care - HMO | Admitting: Physician Assistant

## 2014-07-25 ENCOUNTER — Encounter: Payer: Self-pay | Admitting: Physician Assistant

## 2014-07-25 VITALS — BP 120/72 | HR 56 | Temp 97.9°F | Resp 16 | Ht 68.75 in | Wt 183.0 lb

## 2014-07-25 DIAGNOSIS — R6889 Other general symptoms and signs: Secondary | ICD-10-CM

## 2014-07-25 DIAGNOSIS — E785 Hyperlipidemia, unspecified: Secondary | ICD-10-CM

## 2014-07-25 DIAGNOSIS — E669 Obesity, unspecified: Secondary | ICD-10-CM

## 2014-07-25 DIAGNOSIS — E559 Vitamin D deficiency, unspecified: Secondary | ICD-10-CM

## 2014-07-25 DIAGNOSIS — E663 Overweight: Secondary | ICD-10-CM | POA: Insufficient documentation

## 2014-07-25 DIAGNOSIS — Z1331 Encounter for screening for depression: Secondary | ICD-10-CM

## 2014-07-25 DIAGNOSIS — I10 Essential (primary) hypertension: Secondary | ICD-10-CM

## 2014-07-25 DIAGNOSIS — K2100 Gastro-esophageal reflux disease with esophagitis, without bleeding: Secondary | ICD-10-CM

## 2014-07-25 DIAGNOSIS — Z0001 Encounter for general adult medical examination with abnormal findings: Secondary | ICD-10-CM

## 2014-07-25 DIAGNOSIS — N400 Enlarged prostate without lower urinary tract symptoms: Secondary | ICD-10-CM

## 2014-07-25 DIAGNOSIS — Z789 Other specified health status: Secondary | ICD-10-CM

## 2014-07-25 DIAGNOSIS — N529 Male erectile dysfunction, unspecified: Secondary | ICD-10-CM

## 2014-07-25 DIAGNOSIS — R7303 Prediabetes: Secondary | ICD-10-CM

## 2014-07-25 DIAGNOSIS — Z79899 Other long term (current) drug therapy: Secondary | ICD-10-CM

## 2014-07-25 LAB — CBC WITH DIFFERENTIAL/PLATELET
BASOS ABS: 0 10*3/uL (ref 0.0–0.1)
Basophils Relative: 0 % (ref 0–1)
Eosinophils Absolute: 0.1 10*3/uL (ref 0.0–0.7)
Eosinophils Relative: 2 % (ref 0–5)
HEMATOCRIT: 44.8 % (ref 39.0–52.0)
HEMOGLOBIN: 15.1 g/dL (ref 13.0–17.0)
LYMPHS PCT: 30 % (ref 12–46)
Lymphs Abs: 1.2 10*3/uL (ref 0.7–4.0)
MCH: 29.6 pg (ref 26.0–34.0)
MCHC: 33.7 g/dL (ref 30.0–36.0)
MCV: 87.8 fL (ref 78.0–100.0)
MONO ABS: 0.3 10*3/uL (ref 0.1–1.0)
Monocytes Relative: 8 % (ref 3–12)
NEUTROS PCT: 60 % (ref 43–77)
Neutro Abs: 2.3 10*3/uL (ref 1.7–7.7)
Platelets: 149 10*3/uL — ABNORMAL LOW (ref 150–400)
RBC: 5.1 MIL/uL (ref 4.22–5.81)
RDW: 13.6 % (ref 11.5–15.5)
WBC: 3.9 10*3/uL — ABNORMAL LOW (ref 4.0–10.5)

## 2014-07-25 LAB — MAGNESIUM: MAGNESIUM: 1.9 mg/dL (ref 1.5–2.5)

## 2014-07-25 LAB — HEPATIC FUNCTION PANEL
ALK PHOS: 66 U/L (ref 39–117)
ALT: 12 U/L (ref 0–53)
AST: 10 U/L (ref 0–37)
Albumin: 4 g/dL (ref 3.5–5.2)
BILIRUBIN DIRECT: 0.1 mg/dL (ref 0.0–0.3)
BILIRUBIN INDIRECT: 0.6 mg/dL (ref 0.2–1.2)
BILIRUBIN TOTAL: 0.7 mg/dL (ref 0.2–1.2)
TOTAL PROTEIN: 6.2 g/dL (ref 6.0–8.3)

## 2014-07-25 LAB — LIPID PANEL
CHOL/HDL RATIO: 3.1 ratio
Cholesterol: 148 mg/dL (ref 0–200)
HDL: 48 mg/dL (ref >39)
LDL CALC: 85 mg/dL (ref 0–99)
TRIGLYCERIDES: 76 mg/dL (ref <150)
VLDL: 15 mg/dL (ref 0–40)

## 2014-07-25 LAB — BASIC METABOLIC PANEL WITH GFR
BUN: 14 mg/dL (ref 6–23)
CHLORIDE: 106 meq/L (ref 96–112)
CO2: 27 mEq/L (ref 19–32)
Calcium: 9 mg/dL (ref 8.4–10.5)
Creat: 1.05 mg/dL (ref 0.50–1.35)
GFR, EST AFRICAN AMERICAN: 82 mL/min
GFR, EST NON AFRICAN AMERICAN: 71 mL/min
Glucose, Bld: 125 mg/dL — ABNORMAL HIGH (ref 70–99)
POTASSIUM: 4.2 meq/L (ref 3.5–5.3)
SODIUM: 141 meq/L (ref 135–145)

## 2014-07-25 LAB — TSH: TSH: 1.675 u[IU]/mL (ref 0.350–4.500)

## 2014-07-25 NOTE — Progress Notes (Signed)
MEDICARE ANNUAL WELLNESS VISIT AND FOLLOW UP Assessment:   1. Essential hypertension - continue medications, DASH diet, exercise and monitor at home. Call if greater than 130/80.  - CBC with Differential - BASIC METABOLIC PANEL WITH GFR - Hepatic function panel - TSH  2. Gastroesophageal reflux disease with esophagitis Continue PPI/H2 blocker, diet discussed  3. Prediabetes Discussed general issues about diabetes pathophysiology and management., Educational material distributed., Suggested low cholesterol diet., Encouraged aerobic exercise., Discussed foot care., Reminded to get yearly retinal exam. - Hemoglobin A1c - HM DIABETES FOOT EXAM  4. Erectile dysfunction, unspecified erectile dysfunction type Samples given of cialis  5. BPH (benign prostatic hyperplasia) Controlled with doxazosin  6. Hyperlipidemia -continue medications, check lipids, decrease fatty foods, increase activity.  - Lipid panel  7. Vitamin D deficiency Continue supplement  8. Medication management - Magnesium  9. Obesity Obesity with co morbidities- long discussion about weight loss, diet, and exercise   Plan:   During the course of the visit the patient was educated and counseled about appropriate screening and preventive services including:    Pneumococcal vaccine   Influenza vaccine  Td vaccine  Screening electrocardiogram  Colorectal cancer screening  Diabetes screening  Glaucoma screening  Nutrition counseling   Screening recommendations, referrals: Vaccinations: Please see documentation below and orders this visit.  Nutrition assessed and recommended  Colonoscopy up to date Recommended yearly ophthalmology/optometry visit for glaucoma screening and checkup Recommended yearly dental visit for hygiene and checkup Advanced directives - requested  Conditions/risks identified: BMI: Discussed weight loss, diet, and increase physical activity.  Increase physical activity: AHA  recommends 150 minutes of physical activity a week.  Medications reviewed Diabetes is not at goal, ACE/ARB therapy: No, Reason not on Ace Inhibitor/ARB therapy:  just recently had DM, wants to try diet/exercise Urinary Incontinence is not an issue: discussed non pharmacology and pharmacology options.  Fall risk: low- discussed PT, home fall assessment, medications.    Subjective:  Edward [name] is a 73 y.o. male who presents for Medicare Annual Wellness Visit and 3 month follow up for HTN, hyperlipidemia, diabetes x last visit, and vitamin D Def.  Date of last medicare wellness visit was is unknown.  His blood pressure has been controlled at home, today their BP is BP: 120/72 mmHg He does not workout but does yard work/stays active, volleyball at [employer]. He denies chest pain, shortness of breath, dizziness.  He is on cholesterol medication, pravachol and denies myalgias. His cholesterol is at goal. The cholesterol last visit was:   Lab Results  Component Value Date   CHOL 149 04/08/2014   HDL 45 04/08/2014   LDLCALC 93 04/08/2014   TRIG 54 04/08/2014   CHOLHDL 3.3 04/08/2014   He has been working on diet and exercise for diabetes, and denies paresthesia of the feet, polydipsia and polyuria. Last A1C in the office was:  Lab Results  Component Value Date   HGBA1C 6.5* 04/08/2014   Patient is on Vitamin D supplement.   Lab Results  Component Value Date   VD25OH 94* 04/08/2014     B12 def, he is on B12.  Names of Other Physician/Practitioners you currently use: 1. [facility] here for primary care 2. [facility], eye doctor, last visit recent, having surgery 3. Dr. Raynelle Dick, dentist, last visit yearly Patient Care Team: Unk Pinto, MD as PCP - General (Internal Medicine)  Medication Review: Current Outpatient Prescriptions on File Prior to Visit  Medication Sig Dispense  Refill  . aspirin 81 MG tablet Take 81 mg by mouth  daily.    . Cholecalciferol (VITAMIN D PO) Take 5,000 Units by mouth daily.    Marland Kitchen esomeprazole (NEXIUM) 40 MG capsule Take 1 capsule (40 mg total) by mouth daily before breakfast. 90 capsule 3  . Omega-3 Fatty Acids (FISH OIL) 1000 MG CAPS Take 1 capsule by mouth daily.    . pantoprazole (PROTONIX) 40 MG tablet Take 1 tablet (40 mg total) by mouth daily. 90 tablet 3  . pravastatin (PRAVACHOL) 40 MG tablet Take 1 tablet (40 mg total) by mouth daily. 90 tablet 3  . sildenafil (REVATIO) 20 MG tablet Take 1 to 5 tablets by mouth 1 hour before needed 30 tablet 11  . tamsulosin (FLOMAX) 0.4 MG CAPS capsule Take 1 capsule (0.4 mg total) by mouth daily. 90 capsule 3   No current facility-administered medications on file prior to visit.    Current Problems (verified) Patient Active Problem List   Diagnosis Date Noted  . Encounter for long-term (current) use of other medications 04/08/2014  . Hyperlipidemia   . GERD (gastroesophageal reflux disease)   . Hypertension   . Prediabetes   . Vitamin D deficiency   . ED (erectile dysfunction)   . BPH (benign prostatic hyperplasia)     Screening Tests Health Maintenance  Topic Date Due  . MAMMOGRAPHY BRCA POSITIVE  03/01/1972  . TETANUS/TDAP  10/01/2013  . INFLUENZA VACCINE  04/13/2015  . COLONOSCOPY  03/23/2022  . PNEUMOCOCCAL POLYSACCHARIDE VACCINE AGE 68 AND OVER  Completed  . ZOSTAVAX  Completed    Immunization History  Administered Date(s) Administered  . DT 04/08/2014  . Influenza, High Dose Seasonal PF 06/24/2014  . Influenza-Unspecified 07/02/2013  . Pneumococcal-Unspecified 10/02/2007  . Td 10/02/2003  . Zoster 10/01/2006    Preventative care: Last colonoscopy: 2013 Dr. Deatra Ina Ct AB 2003  Prior vaccinations: TD or Tdap: 2015  Influenza: 2015 Pneumococcal: 2009 Shingles/Zostavax:2008  History reviewed: allergies, current medications, past family history, past medical history, past social history, past surgical history and  problem list   Risk Factors: Tobacco History  Substance Use Topics  . Smoking status: Former Smoker -- 0.50 packs/day for 10 years    Quit date: 09/13/1967  . Smokeless tobacco: Never Used  . Alcohol Use: No   He does not smoke.  Patient is a former smoker. Are there smokers in your home (other than you)?  No  Alcohol Current alcohol use: none  Caffeine Current caffeine use: coffee 2 /day  Exercise Current exercise: no regular exercise and but stays active  Nutrition/Diet Current diet: in general, a "healthy" diet    Cardiac risk factors: advanced age (older than 39 for men, 107 for women), diabetes mellitus, dyslipidemia, hypertension, male gender, obesity (BMI >= 30 kg/m2) and sedentary lifestyle.  Depression Screen (Note: if answer to either of the following is "Yes", a more complete depression screening is indicated)   Q1: Over the past two weeks, have you felt down, depressed or hopeless? No  Q2: Over the past two weeks, have you felt little interest or pleasure in doing things? No  Have you lost interest or pleasure in daily life? No  Do you often feel hopeless? No  Do you cry easily over simple problems? No  Activities of Daily Living In your present state of health, do you have any difficulty performing the following activities?:  Driving? No Managing money?  No Feeding yourself? No Getting from bed to chair?  No Climbing a flight of stairs? No Preparing food and eating?: No Bathing or showering? No Getting dressed: No Getting to the toilet? No Using the toilet:No Moving around from place to place: No In the past year have you fallen or had a near fall?:No   Are you sexually active?  No  Do you have more than one partner?  No  Vision Difficulties: Yes  Hearing Difficulties: Yes Do you often ask people to speak up or repeat themselves? Yes Do you experience ringing or noises in your ears? No Do you have difficulty understanding soft or whispered  voices? Yes  Cognition  Do you feel that you have a problem with memory?No  Do you often misplace items? No  Do you feel safe at home?  Yes  Advanced directives Does patient have a [place]? Yes Does patient have a Living Will? Yes   Objective:   Blood pressure 120/72, pulse 56, temperature 97.9 F (36.6 C), resp. rate 16, height 5' 8.75" (1.746 m), weight 183 lb (83.008 kg). Body mass index is 27.23 kg/(m^2).  General appearance: alert, no distress, WD/WN, male Cognitive Testing  Alert? Yes  Normal Appearance?Yes  Oriented to person? Yes  Place? Yes   Time? Yes  Recall of three objects?  Yes  Can perform simple calculations? Yes  Displays appropriate judgment?Yes  Can read the correct time from a watch face?Yes  HEENT: normocephalic, sclerae anicteric, TMs pearly, nares patent, no discharge or erythema, pharynx normal Oral cavity: MMM, no lesions Neck: supple, no lymphadenopathy, no thyromegaly, no masses Heart: RRR, normal S1, S2, no murmurs Lungs: CTA bilaterally, no wheezes, rhonchi, or rales Abdomen: +bs, soft, non tender, non distended, no masses, no hepatomegaly, no splenomegaly Musculoskeletal: nontender, no swelling, no obvious deformity Extremities: no edema, no cyanosis, no clubbing Pulses: 2+ symmetric, upper and lower extremities, normal cap refill Neurological: alert, oriented x 3, CN2-12 intact, strength normal upper extremities and lower extremities, sensation normal throughout, DTRs 2+ throughout, no cerebellar signs, gait normal Psychiatric: normal affect, behavior normal, pleasant   Medicare Attestation I have personally reviewed: The patient's medical and social history Their use of alcohol, tobacco or illicit drugs Their current medications and supplements The patient's functional ability including ADLs,fall risks, home safety risks, cognitive, and hearing and visual impairment Diet and physical activities Evidence for  depression or mood disorders  The patient's weight, height, BMI, and visual acuity have been recorded in the chart.  I have made referrals, counseling, and provided education to the patient based on review of the above and I have provided the patient with a written personalized care plan for preventive services.     Vicie Mutters, PA-C   07/25/2014

## 2014-07-25 NOTE — Patient Instructions (Addendum)
Use a dropper to put olive oil or canola oil in the effected ear- 2-3 times a week. Let it soak for 20-30 min then you can take a shower or use a baby bulb with warm water to wash out the ear wax.  Do not use Qtips     Bad carbs also include fruit juice, alcohol, and sweet tea. These are empty calories that do not signal to your brain that you are full.   Please remember the good carbs are still carbs which convert into sugar. So please measure them out no more than 1/2-1 cup of rice, oatmeal, pasta, and beans  Veggies are however free foods! Pile them on.   Not all fruit is created equal. Please see the list below, the fruit at the bottom is higher in sugars than the fruit at the top. Please avoid all dried fruits.

## 2014-07-26 LAB — HEMOGLOBIN A1C
Hgb A1c MFr Bld: 6.5 % — ABNORMAL HIGH (ref <5.7)
Mean Plasma Glucose: 140 mg/dL — ABNORMAL HIGH (ref <117)

## 2014-07-29 ENCOUNTER — Other Ambulatory Visit: Payer: Self-pay | Admitting: Dermatology

## 2014-09-16 ENCOUNTER — Other Ambulatory Visit: Payer: Self-pay | Admitting: Physician Assistant

## 2014-09-17 ENCOUNTER — Other Ambulatory Visit: Payer: Self-pay | Admitting: Internal Medicine

## 2014-09-17 DIAGNOSIS — N400 Enlarged prostate without lower urinary tract symptoms: Secondary | ICD-10-CM

## 2014-09-17 MED ORDER — TAMSULOSIN HCL 0.4 MG PO CAPS: 0.4000 mg | ORAL_CAPSULE | Freq: Every day | ORAL | Status: DC

## 2014-10-07 ENCOUNTER — Other Ambulatory Visit: Payer: Self-pay | Admitting: Physician Assistant

## 2014-10-31 ENCOUNTER — Encounter: Payer: Self-pay | Admitting: Internal Medicine

## 2014-10-31 ENCOUNTER — Ambulatory Visit (INDEPENDENT_AMBULATORY_CARE_PROVIDER_SITE_OTHER): Payer: Commercial Managed Care - HMO | Admitting: Internal Medicine

## 2014-10-31 VITALS — BP 118/80 | HR 56 | Temp 98.2°F | Resp 16 | Ht 68.75 in | Wt 183.0 lb

## 2014-10-31 DIAGNOSIS — E559 Vitamin D deficiency, unspecified: Secondary | ICD-10-CM

## 2014-10-31 DIAGNOSIS — E669 Obesity, unspecified: Secondary | ICD-10-CM

## 2014-10-31 DIAGNOSIS — E119 Type 2 diabetes mellitus without complications: Secondary | ICD-10-CM

## 2014-10-31 DIAGNOSIS — R7309 Other abnormal glucose: Secondary | ICD-10-CM

## 2014-10-31 DIAGNOSIS — Z79899 Other long term (current) drug therapy: Secondary | ICD-10-CM

## 2014-10-31 DIAGNOSIS — R7303 Prediabetes: Secondary | ICD-10-CM

## 2014-10-31 DIAGNOSIS — E785 Hyperlipidemia, unspecified: Secondary | ICD-10-CM

## 2014-10-31 DIAGNOSIS — I10 Essential (primary) hypertension: Secondary | ICD-10-CM

## 2014-10-31 LAB — CBC WITH DIFFERENTIAL/PLATELET
Basophils Absolute: 0 10*3/uL (ref 0.0–0.1)
Basophils Relative: 0 % (ref 0–1)
EOS PCT: 3 % (ref 0–5)
Eosinophils Absolute: 0.1 10*3/uL (ref 0.0–0.7)
HCT: 47.9 % (ref 39.0–52.0)
HEMOGLOBIN: 15.8 g/dL (ref 13.0–17.0)
LYMPHS ABS: 1.5 10*3/uL (ref 0.7–4.0)
LYMPHS PCT: 35 % (ref 12–46)
MCH: 29.8 pg (ref 26.0–34.0)
MCHC: 33 g/dL (ref 30.0–36.0)
MCV: 90.2 fL (ref 78.0–100.0)
MPV: 11 fL (ref 8.6–12.4)
Monocytes Absolute: 0.4 10*3/uL (ref 0.1–1.0)
Monocytes Relative: 10 % (ref 3–12)
NEUTROS ABS: 2.2 10*3/uL (ref 1.7–7.7)
Neutrophils Relative %: 52 % (ref 43–77)
PLATELETS: 157 10*3/uL (ref 150–400)
RBC: 5.31 MIL/uL (ref 4.22–5.81)
RDW: 13.7 % (ref 11.5–15.5)
WBC: 4.3 10*3/uL (ref 4.0–10.5)

## 2014-10-31 NOTE — Patient Instructions (Signed)

## 2014-10-31 NOTE — Progress Notes (Signed)
Patient ID: Edward [name], male   DOB: 11-18-1940, 74 y.o.   MRN: [id number]   This very nice 74 y.o. MWM presents for 3 month follow up with Hypertension, Hyperlipidemia, Pre-Diabetes and Vitamin D Deficiency.    Patient is monitored expectantly for labile HTN predating since 2010 & BP has been controlled at home. Today's BP: 118/80 mmHg. Patient has had no complaints of any cardiac type chest pain, palpitations, dyspnea/orthopnea/PND, dizziness, claudication, or dependent edema.   Hyperlipidemia is controlled with diet & meds. Patient denies myalgias or other med SE's. Last Lipids were at goal -  Total  Chol 148; HDL 48; LDL 85; Trig 76 on 07/25/2014.   Also, the patient has history of T2_NIDDM since 2011 which he is attempting to control with diet and has had no symptoms of reactive hypoglycemia, diabetic polys, paresthesias or visual blurring.  Last A1c was  6.5% on 07/25/2014.   Further, the patient also has history of Vitamin D Deficiency of 29 in 2008 and supplements vitamin D without any suspected side-effects. Last vitamin D was 94 on 04/08/2014.  Medication Sig  . aspirin 81 MG tab Take 81 mg by mouth daily.  Marland Kitchen VITAMIN D  Take 5,000 Units by mouth daily.  Marland Kitchen esomeprazole  40 MG cap Take 1 capsule (40 mg total) by mouth daily before breakfast.  . FISH OIL 1000 MG  Take 1 capsule by mouth daily.  . pantoprazole 40 MG tab Take 1 tablet (40 mg total) by mouth daily.  . Pravastatin 40 MG tab TAKE 1 TABLET EVERY DAY  . sildenafil (REVATIO) 20 MG tab Take 1 to 5 tablets by mouth 1 hour before needed  . tamsulosin  0.4 MG CAPS caps Take 1 capsule (0.4 mg total) by mouth daily.   Allergies  Allergen Reactions  . Bystolic [Nebivolol Hcl]    PMHx:   Past Medical History  Diagnosis Date  . Personal history of kidney stones   . Hyperlipidemia   . GERD (gastroesophageal reflux disease)   . Hypertension   . LBBB (left bundle branch block)   . Prediabetes   . IBS (irritable bowel  syndrome)   . ED (erectile dysfunction)   . Vitamin D deficiency   . BPH (benign prostatic hyperplasia)    Immunization History  Administered Date(s) Administered  . DT 04/08/2014  . Influenza, High Dose Seasonal PF 06/24/2014  . Influenza-Unspecified 07/02/2013  . Pneumococcal-Unspecified 10/02/2007  . Td 10/02/2003  . Zoster 10/01/2006   Past Surgical History  Procedure Laterality Date  . Cataract extraction w/ intraocular lens implant  2013    left  . Kidney stone surgery     FHx:    Reviewed / unchanged  SHx:    Reviewed / unchanged  Systems Review:  Constitutional: Denies fever, chills, wt changes, headaches, insomnia, fatigue, night sweats, change in appetite. Eyes: Denies redness, blurred vision, diplopia, discharge, itchy, watery eyes.  ENT: Denies discharge, congestion, post nasal drip, epistaxis, sore throat, earache, hearing loss, dental pain, tinnitus, vertigo, sinus pain, snoring.  CV: Denies chest pain, palpitations, irregular heartbeat, syncope, dyspnea, diaphoresis, orthopnea, PND, claudication or edema. Respiratory: denies cough, dyspnea, DOE, pleurisy, hoarseness, laryngitis, wheezing.  Gastrointestinal: Denies dysphagia, odynophagia, heartburn, reflux, water brash, abdominal pain or cramps, nausea, vomiting, bloating, diarrhea, constipation, hematemesis, melena, hematochezia  or hemorrhoids. Genitourinary: Denies dysuria, frequency, urgency, nocturia, hesitancy, discharge, hematuria or flank pain. Musculoskeletal: Denies arthralgias, myalgias, stiffness, jt. swelling, pain, limping or strain/sprain.  Skin: Denies pruritus, rash, hives,  warts, acne, eczema or change in skin lesion(s). Neuro: No weakness, tremor, incoordination, spasms, paresthesia or pain. Psychiatric: Denies confusion, memory loss or sensory loss. Endo: Denies change in weight, skin or hair change.  Heme/Lymph: No excessive bleeding, bruising or enlarged lymph nodes.  Physical Exam  BP  118/80   Pulse 56  Temp 98.2 F   Resp 16  Ht 5' 8.75"   Wt 183 lb   BMI 27.23  Appears well nourished and in no distress. Eyes: PERRLA, EOMs, conjunctiva no swelling or erythema. Sinuses: No frontal/maxillary tenderness ENT/Mouth: EAC's clear, TM's nl w/o erythema, bulging. Nares clear w/o erythema, swelling, exudates. Oropharynx clear without erythema or exudates. Oral hygiene is good. Tongue normal, non obstructing. Hearing intact.  Neck: Supple. Thyroid nl. Car 2+/2+ without bruits, nodes or JVD. Chest: Respirations nl with BS clear & equal w/o rales, rhonchi, wheezing or stridor.  Cor: Heart sounds normal w/ regular rate and rhythm without sig. murmurs, gallops, clicks, or rubs. Peripheral pulses normal and equal  without edema.  Abdomen: Soft & bowel sounds normal. Non-tender w/o guarding, rebound, hernias, masses, or organomegaly.  Lymphatics: Unremarkable.  Musculoskeletal: Full ROM all peripheral extremities, joint stability, 5/5 strength, and normal gait.  Skin: Warm, dry without exposed rashes, lesions or ecchymosis apparent.  Neuro: Cranial nerves intact, reflexes equal bilaterally. Sensory-motor testing grossly intact. Tendon reflexes grossly intact.  Pysch: Alert & oriented x 3.  Insight and judgement nl & appropriate. No ideations.  Assessment and Plan:  1. Hypertension, Labile  - Continue monitor blood pressure at home. Continue diet/meds same.  2. Hyperlipidemia - Continue diet/meds, exercise,& lifestyle modifications. Continue monitor periodic cholesterol/liver & renal functions   3. T2_NIDDM  - Continue diet, exercise, lifestyle modifications. Monitor appropriate labs.  4. Vitamin D Deficiency - Continue supplementation.   Recommended regular exercise, BP monitoring, weight control, and discussed med and SE's. Recommended labs to assess and monitor clinical status. Further disposition pending results of labs.

## 2014-11-01 ENCOUNTER — Encounter: Payer: Self-pay | Admitting: Internal Medicine

## 2014-11-01 DIAGNOSIS — E1122 Type 2 diabetes mellitus with diabetic chronic kidney disease: Secondary | ICD-10-CM | POA: Insufficient documentation

## 2014-11-01 DIAGNOSIS — N183 Chronic kidney disease, stage 3 (moderate): Secondary | ICD-10-CM

## 2014-11-01 LAB — HEPATIC FUNCTION PANEL
ALT: 24 U/L (ref 0–53)
AST: 17 U/L (ref 0–37)
Albumin: 4.2 g/dL (ref 3.5–5.2)
Alkaline Phosphatase: 64 U/L (ref 39–117)
Bilirubin, Direct: 0.2 mg/dL (ref 0.0–0.3)
Indirect Bilirubin: 0.6 mg/dL (ref 0.2–1.2)
TOTAL PROTEIN: 6.5 g/dL (ref 6.0–8.3)
Total Bilirubin: 0.8 mg/dL (ref 0.2–1.2)

## 2014-11-01 LAB — BASIC METABOLIC PANEL WITH GFR
BUN: 16 mg/dL (ref 6–23)
CO2: 29 meq/L (ref 19–32)
Calcium: 8.9 mg/dL (ref 8.4–10.5)
Chloride: 101 mEq/L (ref 96–112)
Creat: 1.07 mg/dL (ref 0.50–1.35)
GFR, EST AFRICAN AMERICAN: 80 mL/min
GFR, Est Non African American: 69 mL/min
GLUCOSE: 127 mg/dL — AB (ref 70–99)
POTASSIUM: 4.1 meq/L (ref 3.5–5.3)
Sodium: 140 mEq/L (ref 135–145)

## 2014-11-01 LAB — TSH: TSH: 2.004 u[IU]/mL (ref 0.350–4.500)

## 2014-11-01 LAB — LIPID PANEL
Cholesterol: 188 mg/dL (ref 0–200)
HDL: 52 mg/dL (ref >39)
LDL Cholesterol: 120 mg/dL — ABNORMAL HIGH (ref 0–99)
TRIGLYCERIDES: 79 mg/dL (ref <150)
Total CHOL/HDL Ratio: 3.6 Ratio
VLDL: 16 mg/dL (ref 0–40)

## 2014-11-01 LAB — HEMOGLOBIN A1C
Hgb A1c MFr Bld: 6.6 % — ABNORMAL HIGH (ref <5.7)
MEAN PLASMA GLUCOSE: 143 mg/dL — AB (ref <117)

## 2014-11-01 LAB — MAGNESIUM: MAGNESIUM: 2 mg/dL (ref 1.5–2.5)

## 2014-11-01 LAB — VITAMIN D 25 HYDROXY (VIT D DEFICIENCY, FRACTURES): VIT D 25 HYDROXY: 85 ng/mL (ref 30–100)

## 2014-11-01 LAB — INSULIN, FASTING: Insulin fasting, serum: 4.6 u[IU]/mL (ref 2.0–19.6)

## 2014-12-15 ENCOUNTER — Other Ambulatory Visit: Payer: Self-pay | Admitting: Physician Assistant

## 2015-01-09 ENCOUNTER — Other Ambulatory Visit: Payer: Self-pay | Admitting: Internal Medicine

## 2015-03-09 ENCOUNTER — Other Ambulatory Visit: Payer: Self-pay

## 2015-04-09 ENCOUNTER — Encounter: Payer: Self-pay | Admitting: Internal Medicine

## 2015-04-09 ENCOUNTER — Ambulatory Visit (INDEPENDENT_AMBULATORY_CARE_PROVIDER_SITE_OTHER): Payer: Commercial Managed Care - HMO | Admitting: Internal Medicine

## 2015-04-09 VITALS — BP 134/72 | HR 56 | Temp 97.8°F | Resp 16 | Ht 69.0 in | Wt 177.6 lb

## 2015-04-09 DIAGNOSIS — Z6826 Body mass index (BMI) 26.0-26.9, adult: Secondary | ICD-10-CM

## 2015-04-09 DIAGNOSIS — Z79899 Other long term (current) drug therapy: Secondary | ICD-10-CM

## 2015-04-09 DIAGNOSIS — Z23 Encounter for immunization: Secondary | ICD-10-CM

## 2015-04-09 DIAGNOSIS — E559 Vitamin D deficiency, unspecified: Secondary | ICD-10-CM

## 2015-04-09 DIAGNOSIS — E119 Type 2 diabetes mellitus without complications: Secondary | ICD-10-CM

## 2015-04-09 DIAGNOSIS — Z9181 History of falling: Secondary | ICD-10-CM

## 2015-04-09 DIAGNOSIS — K219 Gastro-esophageal reflux disease without esophagitis: Secondary | ICD-10-CM

## 2015-04-09 DIAGNOSIS — Z1212 Encounter for screening for malignant neoplasm of rectum: Secondary | ICD-10-CM

## 2015-04-09 DIAGNOSIS — I10 Essential (primary) hypertension: Secondary | ICD-10-CM

## 2015-04-09 DIAGNOSIS — Z1331 Encounter for screening for depression: Secondary | ICD-10-CM

## 2015-04-09 DIAGNOSIS — E785 Hyperlipidemia, unspecified: Secondary | ICD-10-CM

## 2015-04-09 DIAGNOSIS — Z0000 Encounter for general adult medical examination without abnormal findings: Secondary | ICD-10-CM

## 2015-04-09 DIAGNOSIS — Z125 Encounter for screening for malignant neoplasm of prostate: Secondary | ICD-10-CM

## 2015-04-09 LAB — HEPATIC FUNCTION PANEL
ALT: 17 U/L (ref 9–46)
AST: 12 U/L (ref 10–35)
Albumin: 4.1 g/dL (ref 3.6–5.1)
Alkaline Phosphatase: 64 U/L (ref 40–115)
BILIRUBIN DIRECT: 0.1 mg/dL (ref <=0.2)
BILIRUBIN INDIRECT: 0.4 mg/dL (ref 0.2–1.2)
Total Bilirubin: 0.5 mg/dL (ref 0.2–1.2)
Total Protein: 6.2 g/dL (ref 6.1–8.1)

## 2015-04-09 LAB — LIPID PANEL
CHOL/HDL RATIO: 3.5 ratio (ref <=5.0)
CHOLESTEROL: 159 mg/dL (ref 125–200)
HDL: 45 mg/dL (ref >=40)
LDL Cholesterol: 98 mg/dL (ref <130)
Triglycerides: 79 mg/dL (ref <150)
VLDL: 16 mg/dL (ref <30)

## 2015-04-09 LAB — CBC WITH DIFFERENTIAL/PLATELET
Basophils Absolute: 0 10*3/uL (ref 0.0–0.1)
Basophils Relative: 0 % (ref 0–1)
Eosinophils Absolute: 0.2 10*3/uL (ref 0.0–0.7)
Eosinophils Relative: 4 % (ref 0–5)
HCT: 46.6 % (ref 39.0–52.0)
HEMOGLOBIN: 15.2 g/dL (ref 13.0–17.0)
Lymphocytes Relative: 24 % (ref 12–46)
Lymphs Abs: 1.2 10*3/uL (ref 0.7–4.0)
MCH: 29.8 pg (ref 26.0–34.0)
MCHC: 32.6 g/dL (ref 30.0–36.0)
MCV: 91.4 fL (ref 78.0–100.0)
MPV: 11.4 fL (ref 8.6–12.4)
Monocytes Absolute: 0.3 10*3/uL (ref 0.1–1.0)
Monocytes Relative: 7 % (ref 3–12)
Neutro Abs: 3.2 10*3/uL (ref 1.7–7.7)
Neutrophils Relative %: 65 % (ref 43–77)
PLATELETS: 144 10*3/uL — AB (ref 150–400)
RBC: 5.1 MIL/uL (ref 4.22–5.81)
RDW: 13.9 % (ref 11.5–15.5)
WBC: 4.9 10*3/uL (ref 4.0–10.5)

## 2015-04-09 LAB — MAGNESIUM: MAGNESIUM: 2 mg/dL (ref 1.5–2.5)

## 2015-04-09 LAB — BASIC METABOLIC PANEL WITH GFR
BUN: 22 mg/dL (ref 7–25)
CO2: 23 mEq/L (ref 20–31)
CREATININE: 1.04 mg/dL (ref 0.70–1.18)
Calcium: 8.7 mg/dL (ref 8.6–10.3)
Chloride: 107 mEq/L (ref 98–110)
GFR, EST AFRICAN AMERICAN: 82 mL/min (ref >=60)
GFR, EST NON AFRICAN AMERICAN: 71 mL/min (ref >=60)
Glucose, Bld: 195 mg/dL — ABNORMAL HIGH (ref 65–99)
POTASSIUM: 4.1 meq/L (ref 3.5–5.3)
Sodium: 141 mEq/L (ref 135–146)

## 2015-04-09 LAB — HEMOGLOBIN A1C
Hgb A1c MFr Bld: 6.6 % — ABNORMAL HIGH (ref <5.7)
MEAN PLASMA GLUCOSE: 143 mg/dL — AB (ref <117)

## 2015-04-09 LAB — TSH: TSH: 1.089 u[IU]/mL (ref 0.350–4.500)

## 2015-04-09 NOTE — Patient Instructions (Signed)

## 2015-04-09 NOTE — Progress Notes (Signed)
Patient ID: Edward [name], male   DOB: 03/14/1941, 74 y.o.   MRN: [id number]   Comprehensive Examination  This very nice 74 y.o. MWM presents for complete physical.  Patient has been followed for HTN, T2_NIDDM  Prediabetes, Hyperlipidemia, and Vitamin D Deficiency.   HTN predates since 2010. Patient's BP has been controlled at home.Today's BP: 134/72 mmHg. Patient denies any cardiac symptoms as chest pain, palpitations, shortness of breath, dizziness or ankle swelling.   Patient's hyperlipidemia is not controlled with diet and medications. Patient denies myalgias or other medication SE's. Last lipids were not controlled Cholesterol 188; HDL 52; LDL 120*; Triglycerides 79 on 10/31/2014.   Patient has  Hx/o T2_DM since 2011 with A1c 6.8%  and patient denies reactive hypoglycemic symptoms, visual blurring, diabetic polys or paresthesias. Last A1c was  6.6% on 10/31/2014. Patient is attempting to control this with Diet.    Finally, patient has history of Vitamin D Deficiency of 29 in 2008 and last vitamin D was 85 on 10/31/2014.   Medication Sig  . aspirin 81 MG tab Take 81 mg by mouth daily.  Marland Kitchen VITAMIN D Take 5,000 Units by mouth daily.  Marland Kitchen esomeprazole (NEXIUM) 40 MG capsule TAKE 1 CAPSULE EVERY DAY BEFORE BREAKFAST  . FISH OIL 1000 MG CAPS Take 1 capsule by mouth daily.  . pravastatin 40 MG tablet TAKE 1 TABLET EVERY DAY  . sildenafil (REVATIO) 20 MG tablet Take 1 to 5 tablets by mouth 1 hour before needed  . tamsulosin (FLOMAX) 0.4 MG CAPS capsule Take 1 capsule (0.4 mg total) by mouth daily.   Allergies  Allergen Reactions  . Bystolic [Nebivolol Hcl]    Past Medical History  Diagnosis Date  . Personal history of kidney stones   . Hyperlipidemia   . GERD (gastroesophageal reflux disease)   . Hypertension   . LBBB (left bundle branch block)   . Prediabetes   . IBS (irritable bowel syndrome)   . ED (erectile dysfunction)   . Vitamin D deficiency   . BPH (benign prostatic  hyperplasia)    Health Maintenance  Topic Date Due  . OPHTHALMOLOGY EXAM  03/01/1952  . MAMMOGRAPHY BRCA POSITIVE  03/01/1972  . TETANUS/TDAP  10/01/2013  . URINE MICROALBUMIN  04/09/2015  . INFLUENZA VACCINE  04/13/2015  . HEMOGLOBIN A1C  05/01/2015  . FOOT EXAM  07/26/2015  . COLONOSCOPY  03/23/2022  . ZOSTAVAX  Completed  . PNA vac Low Risk Adult  Completed   Immunization History  Administered Date(s) Administered  . DT 04/08/2014  . Influenza, High Dose Seasonal PF 06/24/2014  . Influenza-Unspecified 07/02/2013  . Pneumococcal Conjugate-13 04/09/2015  . Pneumococcal-Unspecified 10/02/2007  . Td 10/02/2003  . Zoster 10/01/2006   Past Surgical History  Procedure Laterality Date  . Cataract extraction w/ intraocular lens implant  2013    left  . Kidney stone surgery     Family History  Problem Relation Age of Onset  . Colon cancer Neg Hx   . Diabetes Sister   . Hypertension Sister   . Heart disease Mother   . Diabetes Sister   . Hypertension Sister    History   Social History  . Marital Status: Married    Spouse Name: N/A  . Number of Children: N/A  . Years of Education: N/A   Occupational History  . 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   Social History Main Topics  . Smoking status: Former Smoker -- 0.50 packs/day for 10 years  Quit date: 09/13/1967  . Smokeless tobacco: Never Used  . Alcohol Use: No  . Drug Use: No  . Sexual Activity: Active    ROS Constitutional: Denies fever, chills, weight loss/gain, headaches, insomnia,  night sweats or change in appetite. Does c/o fatigue. Eyes: Denies redness, blurred vision, diplopia, discharge, itchy or watery eyes.  ENT: Denies discharge, congestion, post nasal drip, epistaxis, sore throat, earache, hearing loss, dental pain, Tinnitus, Vertigo, Sinus pain or snoring.  Cardio: Denies chest pain, palpitations, irregular heartbeat, syncope, dyspnea, diaphoresis, orthopnea, PND,  claudication or edema Respiratory: denies cough, dyspnea, DOE, pleurisy, hoarseness, laryngitis or wheezing.  Gastrointestinal: Denies dysphagia, heartburn, reflux, water brash, pain, cramps, nausea, vomiting, bloating, diarrhea, constipation, hematemesis, melena, hematochezia, jaundice or hemorrhoids Genitourinary: Denies dysuria, frequency, urgency, nocturia, hesitancy, discharge, hematuria or flank pain Musculoskeletal: Denies arthralgia, myalgia, stiffness, Jt. Swelling, pain, limp or strain/sprain. Denies Falls. Skin: Denies puritis, rash, hives, warts, acne, eczema or change in skin lesion Neuro: No weakness, tremor, incoordination, spasms, paresthesia or pain Psychiatric: Denies confusion, memory loss or sensory loss. Denies Depression. Endocrine: Denies change in weight, skin, hair change, nocturia, and paresthesia, diabetic polys, visual blurring or hyper / hypo glycemic episodes.  Heme/Lymph: No excessive bleeding, bruising or enlarged lymph nodes.  Physical Exam  BP 134/72  Pulse 56  Temp  97.8 F Resp 16  Ht _0    Wt 177 lb 9.6 oz    BMI 26.22  General Appearance: Well nourished, in no apparent distress. Eyes: PERRLA, EOMs, conjunctiva no swelling or erythema, normal fundi and vessels. Sinuses: No frontal/maxillary tenderness ENT/Mouth: EACs patent / TMs  nl. Nares clear without erythema, swelling, mucoid exudates. Oral hygiene is good. No erythema, swelling, or exudate. Tongue normal, non-obstructing. Tonsils not swollen or erythematous. Hearing normal.  Neck: Supple, thyroid normal. No bruits, nodes or JVD. Respiratory: Respiratory effort normal.  BS equal and clear bilateral without rales, rhonci, wheezing or stridor. Cardio: Heart sounds are normal with regular rate and rhythm and no murmurs, rubs or gallops. Peripheral pulses are normal and equal bilaterally without edema. No aortic or femoral bruits. Chest: symmetric with normal excursions and percussion.  Abdomen:  Flat, soft, with bowel sounds. Nontender, no guarding, rebound, hernias, masses, or organomegaly.  Lymphatics: Non tender without lymphadenopathy.  Genitourinary: No hernias.Testes nl. DRE - prostate nl for age - smooth & firm w/o nodules. Musculoskeletal: Full ROM all peripheral extremities, joint stability, 5/5 strength, and normal gait. Skin: Warm and dry without rashes, lesions, cyanosis, clubbing or  ecchymosis.  Neuro: Cranial nerves intact, reflexes equal bilaterally. Normal muscle tone, no cerebellar symptoms. Sensation intact.  Pysch: Awake and oriented X 3 with normal affect, insight and judgment appropriate.   Assessment and Plan  1. Essential hypertension  - EKG 12-Lead - [place], RETROPERITNL ABD,  LTD - TSH  2. Hyperlipidemia  - Lipid panel  3. T2_NIDDM  - Microalbumin / creatinine urine ratio - Hemoglobin A1c - Insulin, random  4. Vitamin D deficiency  - Vit D  25 hydroxy   5. Gastroesophageal reflux disease  6. Screening for rectal cancer  - POC Hemoccult Bld/Stl   7. Prostate cancer screening  - PSA  8. Depression screen  - Screen Negative  9. Need for prophylactic vaccination against Streptococcus pneumoniae (pneumococcus)  - Pneumococcal conjugate vaccine 13-valent  10. Medication management  - Urine Microscopic - CBC with Differential/Platelet - BASIC METABOLIC PANEL WITH GFR - Hepatic function panel - Magnesium  11. BMI 26.0-26.9,adult   12. At  low risk for fall   Continue prudent diet as discussed, weight control, BP monitoring, regular exercise, and medications as discussed.  Discussed med effects and SE's. Routine screening labs and tests as requested with regular follow-up as recommended.  Over 40 minutes of exam, counseling &  chart review was performed

## 2015-04-10 LAB — MICROALBUMIN / CREATININE URINE RATIO
CREATININE, URINE: 136.2 mg/dL
Microalb Creat Ratio: 13.2 mg/g (ref 0.0–30.0)
Microalb, Ur: 1.8 mg/dL (ref <2.0)

## 2015-04-10 LAB — URINALYSIS, MICROSCOPIC ONLY
Bacteria, UA: NONE SEEN [HPF]
Casts: NONE SEEN [LPF]
Crystals: NONE SEEN [HPF]
RBC / HPF: NONE SEEN RBC/HPF (ref <=2)
YEAST: NONE SEEN [HPF]

## 2015-04-10 LAB — VITAMIN D 25 HYDROXY (VIT D DEFICIENCY, FRACTURES): Vit D, 25-Hydroxy: 90 ng/mL (ref 30–100)

## 2015-04-10 LAB — PSA: PSA: 3.14 ng/mL (ref <=4.00)

## 2015-04-10 LAB — INSULIN, RANDOM: INSULIN: 14.9 u[IU]/mL (ref 2.0–19.6)

## 2015-04-11 ENCOUNTER — Other Ambulatory Visit: Payer: Self-pay | Admitting: Internal Medicine

## 2015-04-11 DIAGNOSIS — N410 Acute prostatitis: Secondary | ICD-10-CM

## 2015-04-11 MED ORDER — CIPROFLOXACIN HCL 250 MG PO TABS: ORAL_TABLET | ORAL | Status: DC

## 2015-04-27 ENCOUNTER — Other Ambulatory Visit: Payer: Self-pay | Admitting: *Deleted

## 2015-04-27 MED ORDER — PANTOPRAZOLE SODIUM 40 MG PO TBEC: DELAYED_RELEASE_TABLET | ORAL | Status: DC

## 2015-04-29 ENCOUNTER — Other Ambulatory Visit: Payer: Self-pay | Admitting: *Deleted

## 2015-04-29 MED ORDER — PANTOPRAZOLE SODIUM 40 MG PO TBEC: DELAYED_RELEASE_TABLET | ORAL | Status: DC

## 2015-05-11 ENCOUNTER — Other Ambulatory Visit: Payer: Self-pay | Admitting: *Deleted

## 2015-05-11 MED ORDER — PANTOPRAZOLE SODIUM 40 MG PO TBEC: DELAYED_RELEASE_TABLET | ORAL | Status: DC

## 2015-06-19 ENCOUNTER — Other Ambulatory Visit: Payer: Self-pay | Admitting: Internal Medicine

## 2015-06-26 ENCOUNTER — Other Ambulatory Visit: Payer: Self-pay | Admitting: Internal Medicine

## 2015-07-13 ENCOUNTER — Other Ambulatory Visit: Payer: Self-pay | Admitting: *Deleted

## 2015-07-13 MED ORDER — OMEPRAZOLE 40 MG PO CPDR: 40.0000 mg | DELAYED_RELEASE_CAPSULE | Freq: Every day | ORAL | Status: DC

## 2015-07-14 ENCOUNTER — Ambulatory Visit: Payer: Self-pay | Admitting: Internal Medicine

## 2015-07-28 ENCOUNTER — Ambulatory Visit (INDEPENDENT_AMBULATORY_CARE_PROVIDER_SITE_OTHER): Payer: Commercial Managed Care - HMO | Admitting: *Deleted

## 2015-07-28 DIAGNOSIS — Z23 Encounter for immunization: Secondary | ICD-10-CM

## 2015-10-14 DIAGNOSIS — L853 Xerosis cutis: Secondary | ICD-10-CM | POA: Diagnosis not present

## 2015-10-14 DIAGNOSIS — L570 Actinic keratosis: Secondary | ICD-10-CM | POA: Diagnosis not present

## 2015-10-14 DIAGNOSIS — D225 Melanocytic nevi of trunk: Secondary | ICD-10-CM | POA: Diagnosis not present

## 2015-10-14 DIAGNOSIS — L821 Other seborrheic keratosis: Secondary | ICD-10-CM | POA: Diagnosis not present

## 2015-10-14 DIAGNOSIS — L918 Other hypertrophic disorders of the skin: Secondary | ICD-10-CM | POA: Diagnosis not present

## 2015-10-14 DIAGNOSIS — D485 Neoplasm of uncertain behavior of skin: Secondary | ICD-10-CM | POA: Diagnosis not present

## 2015-10-20 ENCOUNTER — Ambulatory Visit (INDEPENDENT_AMBULATORY_CARE_PROVIDER_SITE_OTHER): Payer: PPO | Admitting: Internal Medicine

## 2015-10-20 ENCOUNTER — Encounter: Payer: Self-pay | Admitting: Internal Medicine

## 2015-10-20 VITALS — BP 118/70 | HR 70 | Temp 97.7°F | Resp 16 | Ht 69.0 in | Wt 185.2 lb

## 2015-10-20 DIAGNOSIS — E559 Vitamin D deficiency, unspecified: Secondary | ICD-10-CM

## 2015-10-20 DIAGNOSIS — E119 Type 2 diabetes mellitus without complications: Secondary | ICD-10-CM | POA: Diagnosis not present

## 2015-10-20 DIAGNOSIS — E785 Hyperlipidemia, unspecified: Secondary | ICD-10-CM

## 2015-10-20 DIAGNOSIS — I10 Essential (primary) hypertension: Secondary | ICD-10-CM

## 2015-10-20 DIAGNOSIS — E669 Obesity, unspecified: Secondary | ICD-10-CM | POA: Diagnosis not present

## 2015-10-20 DIAGNOSIS — K219 Gastro-esophageal reflux disease without esophagitis: Secondary | ICD-10-CM | POA: Diagnosis not present

## 2015-10-20 DIAGNOSIS — Z79899 Other long term (current) drug therapy: Secondary | ICD-10-CM

## 2015-10-20 DIAGNOSIS — N400 Enlarged prostate without lower urinary tract symptoms: Secondary | ICD-10-CM | POA: Diagnosis not present

## 2015-10-20 DIAGNOSIS — Z1331 Encounter for screening for depression: Secondary | ICD-10-CM

## 2015-10-20 LAB — CBC WITH DIFFERENTIAL/PLATELET
BASOS ABS: 0 10*3/uL (ref 0.0–0.1)
Basophils Relative: 0 % (ref 0–1)
EOS ABS: 0.1 10*3/uL (ref 0.0–0.7)
Eosinophils Relative: 3 % (ref 0–5)
HCT: 44.6 % (ref 39.0–52.0)
Hemoglobin: 14.7 g/dL (ref 13.0–17.0)
LYMPHS PCT: 32 % (ref 12–46)
Lymphs Abs: 1.5 10*3/uL (ref 0.7–4.0)
MCH: 29.2 pg (ref 26.0–34.0)
MCHC: 33 g/dL (ref 30.0–36.0)
MCV: 88.7 fL (ref 78.0–100.0)
MPV: 11.9 fL (ref 8.6–12.4)
Monocytes Absolute: 0.4 10*3/uL (ref 0.1–1.0)
Monocytes Relative: 9 % (ref 3–12)
NEUTROS PCT: 56 % (ref 43–77)
Neutro Abs: 2.6 10*3/uL (ref 1.7–7.7)
Platelets: 153 10*3/uL (ref 150–400)
RBC: 5.03 MIL/uL (ref 4.22–5.81)
RDW: 13.7 % (ref 11.5–15.5)
WBC: 4.6 10*3/uL (ref 4.0–10.5)

## 2015-10-20 LAB — HEPATIC FUNCTION PANEL
ALBUMIN: 3.9 g/dL (ref 3.6–5.1)
ALT: 21 U/L (ref 9–46)
AST: 16 U/L (ref 10–35)
Alkaline Phosphatase: 60 U/L (ref 40–115)
Bilirubin, Direct: 0.1 mg/dL (ref <=0.2)
Indirect Bilirubin: 0.6 mg/dL (ref 0.2–1.2)
TOTAL PROTEIN: 5.9 g/dL — AB (ref 6.1–8.1)
Total Bilirubin: 0.7 mg/dL (ref 0.2–1.2)

## 2015-10-20 LAB — BASIC METABOLIC PANEL WITH GFR
BUN: 18 mg/dL (ref 7–25)
CHLORIDE: 107 mmol/L (ref 98–110)
CO2: 26 mmol/L (ref 20–31)
Calcium: 8.4 mg/dL — ABNORMAL LOW (ref 8.6–10.3)
Creat: 1.1 mg/dL (ref 0.70–1.18)
GFR, EST NON AFRICAN AMERICAN: 66 mL/min (ref >=60)
GFR, Est African American: 77 mL/min (ref >=60)
Glucose, Bld: 121 mg/dL — ABNORMAL HIGH (ref 65–99)
POTASSIUM: 4 mmol/L (ref 3.5–5.3)
Sodium: 140 mmol/L (ref 135–146)

## 2015-10-20 LAB — LIPID PANEL
CHOLESTEROL: 162 mg/dL (ref 125–200)
HDL: 46 mg/dL (ref >=40)
LDL CALC: 105 mg/dL (ref <130)
TRIGLYCERIDES: 54 mg/dL (ref <150)
Total CHOL/HDL Ratio: 3.5 Ratio (ref <=5.0)
VLDL: 11 mg/dL (ref <30)

## 2015-10-20 LAB — MAGNESIUM: MAGNESIUM: 1.9 mg/dL (ref 1.5–2.5)

## 2015-10-20 LAB — TSH: TSH: 1.76 mIU/L (ref 0.40–4.50)

## 2015-10-20 NOTE — Progress Notes (Signed)
Patient ID: Edward [name], male   DOB: 1941/06/26, 75 y.o.   MRN: [id number]   This very nice 75 y.o. MWM presents for 6 month follow up with Hypertension, Hyperlipidemia, Pre-Diabetes and Vitamin D Deficiency. Patient also has GERD controlled with diet and meds.   Patient is monitored expectantly for Labile HTN since 2010 & BP has been controlled at home. Today's BP: 118/70 mmHg. Patient has had no complaints of any cardiac type chest pain, palpitations, dyspnea/orthopnea/PND, dizziness, claudication, or dependent edema.   Hyperlipidemia is controlled with diet. Last Lipids were at goal with Cholesterol 159; HDL 45; LDL 98; Triglycerides 79 on 04/09/2015.   Also, the patient has history of PreDiabetes with A1c 6.1% in 2011 and has had no symptoms of reactive hypoglycemia, diabetic polys, paresthesias or visual blurring.  Last A1c was elevated to 6.6% on 04/09/2015.   Further, the patient also has history of Vitamin D Deficiency of "29" in 2008 and supplements vitamin D without any suspected side-effects. Last vitamin D was 90 on 04/09/2015.   Medication Sig  . aspirin 81 MG  Take 81 mg by mouth daily.  Marland Kitchen VITAMIN D Take 5,000 Units by mouth daily.  . Omega-3 FISH OIL 1000 MG Take 1 capsule by mouth daily.  . pantoprazole  40 MG TAKE ONE TABLET BY MOUTH ONCE DAILY FOR  ACID  INDIGESTION  . pravastatin  40 MG  TAKE 1 TABLET EVERY DAY  . sildenafil  20 MG  TAKE ONE TO FIVE TABLETS BY MOUTH ONE HOUR BEFORE NEEDED  . tamsulosin0.4 MG  TAKE 1 CAPSULE EVERY DAY   Allergies  Allergen Reactions  . Bystolic [Nebivolol Hcl]    PMHx:   Past Medical History  Diagnosis Date  . Personal history of kidney stones   . Hyperlipidemia   . GERD (gastroesophageal reflux disease)   . Hypertension   . LBBB (left bundle branch block)   . Prediabetes   . IBS (irritable bowel syndrome)   . ED (erectile dysfunction)   . Vitamin D deficiency   . BPH (benign prostatic hyperplasia)    Immunization History   Administered Date(s) Administered  . DT 04/08/2014  . Influenza, High Dose Seasonal PF 06/24/2014, 07/28/2015  . Influenza-Unspecified 07/02/2013  . Pneumococcal Conjugate-13 04/09/2015  . Pneumococcal-Unspecified 10/02/2007  . Td 10/02/2003  . Zoster 10/01/2006   Past Surgical History  Procedure Laterality Date  . Cataract extraction w/ intraocular lens implant  2013    left  . Kidney stone surgery     FHx:    Reviewed / unchanged  SHx:    Reviewed / unchanged  Systems Review:  Constitutional: Denies fever, chills, wt changes, headaches, insomnia, fatigue, night sweats, change in appetite. Eyes: Denies redness, blurred vision, diplopia, discharge, itchy, watery eyes.  ENT: Denies discharge, congestion, post nasal drip, epistaxis, sore throat, earache, hearing loss, dental pain, tinnitus, vertigo, sinus pain, snoring.  CV: Denies chest pain, palpitations, irregular heartbeat, syncope, dyspnea, diaphoresis, orthopnea, PND, claudication or edema. Respiratory: denies cough, dyspnea, DOE, pleurisy, hoarseness, laryngitis, wheezing.  Gastrointestinal: Denies dysphagia, odynophagia, heartburn, reflux, water brash, abdominal pain or cramps, nausea, vomiting, bloating, diarrhea, constipation, hematemesis, melena, hematochezia  or hemorrhoids. Genitourinary: Denies dysuria, frequency, urgency, nocturia, hesitancy, discharge, hematuria or flank pain. Musculoskeletal: Denies arthralgias, myalgias, stiffness, jt. swelling, pain, limping or strain/sprain. Denies falls or balance issues.  Skin: Denies pruritus, rash, hives, warts, acne, eczema or change in skin lesion(s). Neuro: No weakness, tremor, incoordination, spasms, paresthesia or pain. Psychiatric: Denies  confusion, memory loss or sensory loss. Denies mood or Depression issues.  Endo: Denies change in weight, skin or hair change.  Heme/Lymph: No excessive bleeding, bruising or enlarged lymph nodes.  Physical Exam  BP 118/70 mmHg   Pulse 70  Temp(Src) 97.7 F (36.5 C)  Resp 16  Ht 5\' 9"  (1.753 m)  Wt 185 lb 3.2 oz (84.006 kg)  BMI 27.34 kg/m2  SpO2 97%  Appears well nourished and in no distress.  Eyes: PERRLA, EOMs, conjunctiva no swelling or erythema. Sinuses: No frontal/maxillary tenderness ENT/Mouth: EAC's clear, TM's nl w/o erythema, bulging. Nares clear w/o erythema, swelling, exudates. Oropharynx clear without erythema or exudates. Oral hygiene is good. Tongue normal, non obstructing. Hearing intact.  Neck: Supple. Thyroid nl. Car 2+/2+ without bruits, nodes or JVD. Chest: Respirations nl with BS clear & equal w/o rales, rhonchi, wheezing or stridor.  Cor: Heart sounds normal w/ regular rate and rhythm without sig. murmurs, gallops, clicks, or rubs. Peripheral pulses normal and equal  without edema.  Abdomen: Soft & bowel sounds normal. Non-tender w/o guarding, rebound, hernias, masses, or organomegaly.  Lymphatics: Unremarkable.  Musculoskeletal: Full ROM all peripheral extremities, joint stability, 5/5 strength, and normal gait.  Skin: Warm, dry without exposed rashes, lesions or ecchymosis apparent.  Neuro: Cranial nerves intact, reflexes equal bilaterally. Sensory-motor testing grossly intact. Tendon reflexes grossly intact.  Pysch: Alert & oriented x 3.  Insight and judgement nl & appropriate. No ideations.  Assessment and Plan:  1. Essential hypertension  - TSH  2. Hyperlipidemia  - Lipid panel - TSH  3. T2_NIDDM  - Hemoglobin A1c - Insulin, random  4. Vitamin D deficiency  - VITAMIN D 25 Hydroxy   5. Obesity   6. Gastroesophageal reflux disease   7. Medication management  - CBC with Differential/Platelet - BASIC METABOLIC PANEL WITH GFR - Hepatic function panel - Magnesium   Recommended regular exercise, BP monitoring, weight control, and discussed med and SE's. Recommended labs to assess and monitor clinical status. Further disposition pending results of labs. Over 30  minutes of exam, counseling, chart review was performed

## 2015-10-20 NOTE — Patient Instructions (Signed)

## 2015-10-21 LAB — HEMOGLOBIN A1C
Hgb A1c MFr Bld: 6.7 % — ABNORMAL HIGH (ref <5.7)
MEAN PLASMA GLUCOSE: 146 mg/dL — AB (ref <117)

## 2015-10-21 LAB — INSULIN, RANDOM: INSULIN: 5.6 u[IU]/mL (ref 2.0–19.6)

## 2015-10-21 LAB — VITAMIN D 25 HYDROXY (VIT D DEFICIENCY, FRACTURES): Vit D, 25-Hydroxy: 91 ng/mL (ref 30–100)

## 2015-10-23 ENCOUNTER — Other Ambulatory Visit: Payer: Self-pay | Admitting: *Deleted

## 2015-10-23 MED ORDER — TAMSULOSIN HCL 0.4 MG PO CAPS: 0.4000 mg | ORAL_CAPSULE | Freq: Every day | ORAL | Status: DC

## 2015-10-23 MED ORDER — PRAVASTATIN SODIUM 40 MG PO TABS: 40.0000 mg | ORAL_TABLET | Freq: Every day | ORAL | Status: DC

## 2015-10-23 MED ORDER — PANTOPRAZOLE SODIUM 40 MG PO TBEC: DELAYED_RELEASE_TABLET | ORAL | Status: DC

## 2015-10-23 MED ORDER — SILDENAFIL CITRATE 20 MG PO TABS: ORAL_TABLET | ORAL | Status: DC

## 2016-01-20 ENCOUNTER — Ambulatory Visit (INDEPENDENT_AMBULATORY_CARE_PROVIDER_SITE_OTHER): Payer: PPO | Admitting: Internal Medicine

## 2016-01-20 ENCOUNTER — Encounter: Payer: Self-pay | Admitting: Internal Medicine

## 2016-01-20 VITALS — BP 130/60 | HR 75 | Temp 98.1°F | Resp 14 | Ht 69.0 in | Wt 181.2 lb

## 2016-01-20 DIAGNOSIS — E559 Vitamin D deficiency, unspecified: Secondary | ICD-10-CM | POA: Diagnosis not present

## 2016-01-20 DIAGNOSIS — N400 Enlarged prostate without lower urinary tract symptoms: Secondary | ICD-10-CM

## 2016-01-20 DIAGNOSIS — E785 Hyperlipidemia, unspecified: Secondary | ICD-10-CM | POA: Diagnosis not present

## 2016-01-20 DIAGNOSIS — R6889 Other general symptoms and signs: Secondary | ICD-10-CM | POA: Diagnosis not present

## 2016-01-20 DIAGNOSIS — I10 Essential (primary) hypertension: Secondary | ICD-10-CM | POA: Diagnosis not present

## 2016-01-20 DIAGNOSIS — E669 Obesity, unspecified: Secondary | ICD-10-CM

## 2016-01-20 DIAGNOSIS — Z0000 Encounter for general adult medical examination without abnormal findings: Secondary | ICD-10-CM | POA: Diagnosis not present

## 2016-01-20 DIAGNOSIS — E119 Type 2 diabetes mellitus without complications: Secondary | ICD-10-CM

## 2016-01-20 DIAGNOSIS — Z0001 Encounter for general adult medical examination with abnormal findings: Secondary | ICD-10-CM | POA: Diagnosis not present

## 2016-01-20 DIAGNOSIS — Z79899 Other long term (current) drug therapy: Secondary | ICD-10-CM

## 2016-01-20 DIAGNOSIS — N529 Male erectile dysfunction, unspecified: Secondary | ICD-10-CM

## 2016-01-20 DIAGNOSIS — K219 Gastro-esophageal reflux disease without esophagitis: Secondary | ICD-10-CM | POA: Diagnosis not present

## 2016-01-20 LAB — CBC WITH DIFFERENTIAL/PLATELET
BASOS ABS: 0 {cells}/uL (ref 0–200)
Basophils Relative: 0 %
EOS PCT: 3 %
Eosinophils Absolute: 180 cells/uL (ref 15–500)
HCT: 47.2 % (ref 38.5–50.0)
Hemoglobin: 15.8 g/dL (ref 13.2–17.1)
Lymphocytes Relative: 20 %
Lymphs Abs: 1200 cells/uL (ref 850–3900)
MCH: 30 pg (ref 27.0–33.0)
MCHC: 33.5 g/dL (ref 32.0–36.0)
MCV: 89.7 fL (ref 80.0–100.0)
MONOS PCT: 6 %
MPV: 11.2 fL (ref 7.5–12.5)
Monocytes Absolute: 360 cells/uL (ref 200–950)
NEUTROS ABS: 4260 {cells}/uL (ref 1500–7800)
Neutrophils Relative %: 71 %
PLATELETS: 137 10*3/uL — AB (ref 140–400)
RBC: 5.26 MIL/uL (ref 4.20–5.80)
RDW: 13.8 % (ref 11.0–15.0)
WBC: 6 10*3/uL (ref 3.8–10.8)

## 2016-01-20 LAB — HEPATIC FUNCTION PANEL
ALBUMIN: 4.2 g/dL (ref 3.6–5.1)
ALK PHOS: 67 U/L (ref 40–115)
ALT: 13 U/L (ref 9–46)
AST: 11 U/L (ref 10–35)
BILIRUBIN DIRECT: 0.2 mg/dL (ref <=0.2)
BILIRUBIN TOTAL: 0.7 mg/dL (ref 0.2–1.2)
Indirect Bilirubin: 0.5 mg/dL (ref 0.2–1.2)
TOTAL PROTEIN: 6.3 g/dL (ref 6.1–8.1)

## 2016-01-20 LAB — BASIC METABOLIC PANEL WITH GFR
BUN: 18 mg/dL (ref 7–25)
CALCIUM: 8.6 mg/dL (ref 8.6–10.3)
CHLORIDE: 106 mmol/L (ref 98–110)
CO2: 27 mmol/L (ref 20–31)
CREATININE: 1.07 mg/dL (ref 0.70–1.18)
GFR, EST AFRICAN AMERICAN: 79 mL/min (ref >=60)
GFR, Est Non African American: 68 mL/min (ref >=60)
Glucose, Bld: 127 mg/dL — ABNORMAL HIGH (ref 65–99)
Potassium: 4.4 mmol/L (ref 3.5–5.3)
SODIUM: 141 mmol/L (ref 135–146)

## 2016-01-20 LAB — HEMOGLOBIN A1C
HEMOGLOBIN A1C: 6.7 % — AB (ref <5.7)
MEAN PLASMA GLUCOSE: 146 mg/dL

## 2016-01-20 LAB — LIPID PANEL
Cholesterol: 160 mg/dL (ref 125–200)
HDL: 48 mg/dL (ref >=40)
LDL CALC: 99 mg/dL (ref <130)
Total CHOL/HDL Ratio: 3.3 Ratio (ref <=5.0)
Triglycerides: 64 mg/dL (ref <150)
VLDL: 13 mg/dL (ref <30)

## 2016-01-20 LAB — TSH: TSH: 1.68 m[IU]/L (ref 0.40–4.50)

## 2016-01-20 NOTE — Progress Notes (Signed)
MEDICARE ANNUAL WELLNESS VISIT AND FOLLOW UP Assessment:    1. Essential hypertension -well controlled -cont dash diet -exercise as tolerated -monitor at home - TSH  2. Gastroesophageal reflux disease, esophagitis presence not specified -cont meds prn -avoid trigger foods  3. T2_NIDDM -control with diet and exercise - Hemoglobin A1c  4. Erectile dysfunction, unspecified erectile dysfunction type -cont sildenafil prn  5. BPH (benign prostatic hyperplasia) -cont meds -lifestyle modifications have been discussed  6. Prostatism -cont meds  7. Hyperlipidemia -cont statin - Lipid panel  8. Vitamin D deficiency -cont supplement  9. Medication management   10. Obesity   11. Medicare annual wellness visit, initial  - CBC with Differential/Platelet - BASIC METABOLIC PANEL WITH GFR - Hepatic function panel    Over 30 minutes of exam, counseling, chart review, and critical decision making was performed  Plan:   During the course of the visit the patient was educated and counseled about appropriate screening and preventive services including:    Pneumococcal vaccine   Influenza vaccine  Prevnar 13  Td vaccine  Screening electrocardiogram  Colorectal cancer screening  Diabetes screening  Glaucoma screening  Nutrition counseling   Conditions/risks identified: BMI: Discussed weight loss, diet, and increase physical activity.  Increase physical activity: AHA recommends 150 minutes of physical activity a week.  Medications reviewed Diabetes is not at goal, ACE/ARB therapy: Yes. Urinary Incontinence is not an issue: discussed non pharmacology and pharmacology options.  Fall risk: low- discussed PT, home fall assessment, medications.    Subjective:  Edward [name] is a 75 y.o. male who presents for Medicare Annual Wellness Visit and 3 month follow up for HTN, hyperlipidemia, prediabetes, and vitamin D Def.  Date of last medicare wellness visit was  is unknown.  His blood pressure has been controlled at home, today their BP is BP: 130/60 mmHg He does workout. He denies chest pain, shortness of breath, dizziness.   He is on cholesterol medication and denies myalgias. His cholesterol is at goal. The cholesterol last visit was:   Lab Results  Component Value Date   CHOL 162 10/20/2015   HDL 46 10/20/2015   LDLCALC 105 10/20/2015   TRIG 54 10/20/2015   CHOLHDL 3.5 10/20/2015   He has been working on diet and exercise for prediabetes, and denies foot ulcerations, hyperglycemia, hypoglycemia , increased appetite, nausea, paresthesia of the feet, polydipsia, polyuria, visual disturbances, vomiting and weight loss. Last A1C in the office was:  Lab Results  Component Value Date   HGBA1C 6.7* 10/20/2015   Last GFR Lab Results  Component Value Date   [facility] 66 10/20/2015     Lab Results  Component Value Date   GFRAA 77 10/20/2015   Patient is on Vitamin D supplement.   Lab Results  Component Value Date   VD25OH 91 10/20/2015      Medication Review: Current Outpatient Prescriptions on File Prior to Visit  Medication Sig Dispense Refill  . aspirin 81 MG tablet Take 81 mg by mouth daily.    . Cholecalciferol (VITAMIN D PO) Take 5,000 Units by mouth daily.    . Omega-3 Fatty Acids (FISH OIL) 1000 MG CAPS Take 1 capsule by mouth daily.    . pantoprazole (PROTONIX) 40 MG tablet TAKE ONE TABLET BY MOUTH ONCE DAILY FOR  ACID  INDIGESTION 90 tablet 3  . pravastatin (PRAVACHOL) 40 MG tablet Take 1 tablet (40 mg total) by mouth daily. 90 tablet 3  . sildenafil (REVATIO) 20 MG tablet TAKE  ONE TO FIVE TABLETS BY MOUTH ONE HOUR BEFORE NEEDED 90 tablet 3  . tamsulosin (FLOMAX) 0.4 MG CAPS capsule Take 1 capsule (0.4 mg total) by mouth daily. 90 capsule 3   No current facility-administered medications on file prior to visit.    Current Problems (verified) Patient Active Problem List   Diagnosis Date Noted  . Prostatism 10/20/2015  .  Medicare annual wellness visit, initial 04/09/2015  . T2_NIDDM 11/01/2014  . Obesity 07/25/2014  . Medication management 04/08/2014  . Hyperlipidemia   . GERD (gastroesophageal reflux disease)   . Hypertension   . Vitamin D deficiency   . ED (erectile dysfunction)   . BPH (benign prostatic hyperplasia)     Screening Tests Immunization History  Administered Date(s) Administered  . DT 04/08/2014  . Influenza, High Dose Seasonal PF 06/24/2014, 07/28/2015  . Influenza-Unspecified 07/02/2013  . Pneumococcal Conjugate-13 04/09/2015  . Pneumococcal-Unspecified 10/02/2007  . Td 10/02/2003  . Zoster 10/01/2006    Preventative care: Last colonoscopy: 2013  Prior vaccinations: TD or Tdap: 2015  Influenza:2016  Pneumococcal: 2009 Prevnar13: 2016 Shingles/Zostavax: 2008  Names of Other Physician/Practitioners you currently use: 1.  [facility] here for primary care 2. [facility], eye doctor, last visit January 2017 3.Dr. Modena Nunnery, dentist, last visit  2017 Patient Care Team: Unk Pinto, MD as PCP - General (Internal Medicine) Carolan Clines, MD as Consulting Physician (Urology) Luberta Mutter, MD as Consulting Physician (Ophthalmology) Inda Castle, MD as Consulting Physician (Gastroenterology)  Past Surgical History  Procedure Laterality Date  . Cataract extraction w/ intraocular lens implant  2013    left  . Kidney stone surgery     Family History  Problem Relation Age of Onset  . Colon cancer Neg Hx   . Diabetes Sister   . Hypertension Sister   . Heart disease Mother   . Diabetes Sister   . Hypertension Sister    Social History  Substance Use Topics  . Smoking status: Former Smoker -- 0.50 packs/day for 10 years    Quit date: 09/13/1967  . Smokeless tobacco: Never Used  . Alcohol Use: No   Allergies  Allergen Reactions  . Bystolic [Nebivolol Hcl]     MEDICARE WELLNESS OBJECTIVES: Tobacco  use: He does not smoke.  Patient is a former smoker. If yes, counseling given Alcohol Current alcohol use: none Diet: well balanced Physical activity:   Cardiac risk factors:   Depression/mood screen:   Depression screen [facility] 2/9 10/20/2015  Decreased Interest 0  Down, Depressed, Hopeless 0  PHQ - 2 Score 0    ADLs:  In your present state of health, do you have any difficulty performing the following activities: 10/20/2015 04/09/2015  Hearing? N Y  Vision? N N  Difficulty concentrating or making decisions? N N  Walking or climbing stairs? - N  Dressing or bathing? N N  Doing errands, shopping? N N     Cognitive Testing  Alert? Yes  Normal Appearance?Yes  Oriented to person? Yes  Place? Yes   Time? Yes  Recall of three objects?  Yes  Can perform simple calculations? Yes  Displays appropriate judgment?Yes  Can read the correct time from a watch face?Yes  EOL planning:     Objective:   Today's Vitals   01/20/16 0944  BP: 130/60  Pulse: 75  Temp: 98.1 F (36.7 C)  TempSrc: Temporal  Resp: 14  Height: 5\' 9"  (1.753 m)  Weight: 181 lb 3.2 oz (82.192 kg)  SpO2:  96%   Body mass index is 26.75 kg/(m^2).  General appearance: alert, no distress, WD/WN, male HEENT: normocephalic, sclerae anicteric, TMs pearly, nares patent, no discharge or erythema, pharynx normal Oral cavity: MMM, no lesions Neck: supple, no lymphadenopathy, no thyromegaly, no masses Heart: RRR, normal S1, S2, no murmurs Lungs: CTA bilaterally, no wheezes, rhonchi, or rales Abdomen: +bs, soft, non tender, non distended, no masses, no hepatomegaly, no splenomegaly Musculoskeletal: nontender, no swelling, no obvious deformity Extremities: no edema, no cyanosis, no clubbing Pulses: 2+ symmetric, upper and lower extremities, normal cap refill Neurological: alert, oriented x 3, CN2-12 intact, strength normal upper extremities and lower extremities, sensation normal throughout, DTRs 2+ throughout, no cerebellar  signs, gait normal Psychiatric: normal affect, behavior normal, pleasant   Medicare Attestation I have personally reviewed: The patient's medical and social history Their use of alcohol, tobacco or illicit drugs Their current medications and supplements The patient's functional ability including ADLs,fall risks, home safety risks, cognitive, and hearing and visual impairment Diet and physical activities Evidence for depression or mood disorders  The patient's weight, height, BMI, and visual acuity have been recorded in the chart.  I have made referrals, counseling, and provided education to the patient based on review of the above and I have provided the patient with a written personalized care plan for preventive services.     Starlyn Skeans, PA-C   01/20/2016

## 2016-02-24 DIAGNOSIS — H35372 Puckering of macula, left eye: Secondary | ICD-10-CM | POA: Diagnosis not present

## 2016-02-24 DIAGNOSIS — Z961 Presence of intraocular lens: Secondary | ICD-10-CM | POA: Diagnosis not present

## 2016-02-24 DIAGNOSIS — H52203 Unspecified astigmatism, bilateral: Secondary | ICD-10-CM | POA: Diagnosis not present

## 2016-04-26 ENCOUNTER — Encounter: Payer: Self-pay | Admitting: Internal Medicine

## 2016-04-27 ENCOUNTER — Encounter: Payer: Self-pay | Admitting: Internal Medicine

## 2016-05-02 ENCOUNTER — Encounter: Payer: Self-pay | Admitting: Internal Medicine

## 2016-05-02 ENCOUNTER — Ambulatory Visit (INDEPENDENT_AMBULATORY_CARE_PROVIDER_SITE_OTHER): Payer: PPO | Admitting: Internal Medicine

## 2016-05-02 VITALS — BP 122/72 | HR 60 | Temp 97.7°F | Resp 16 | Ht 68.75 in | Wt 183.6 lb

## 2016-05-02 DIAGNOSIS — E669 Obesity, unspecified: Secondary | ICD-10-CM

## 2016-05-02 DIAGNOSIS — Z23 Encounter for immunization: Secondary | ICD-10-CM | POA: Diagnosis not present

## 2016-05-02 DIAGNOSIS — Z136 Encounter for screening for cardiovascular disorders: Secondary | ICD-10-CM | POA: Diagnosis not present

## 2016-05-02 DIAGNOSIS — I10 Essential (primary) hypertension: Secondary | ICD-10-CM | POA: Diagnosis not present

## 2016-05-02 DIAGNOSIS — E559 Vitamin D deficiency, unspecified: Secondary | ICD-10-CM | POA: Diagnosis not present

## 2016-05-02 DIAGNOSIS — E119 Type 2 diabetes mellitus without complications: Secondary | ICD-10-CM | POA: Diagnosis not present

## 2016-05-02 DIAGNOSIS — Z79899 Other long term (current) drug therapy: Secondary | ICD-10-CM

## 2016-05-02 DIAGNOSIS — K219 Gastro-esophageal reflux disease without esophagitis: Secondary | ICD-10-CM

## 2016-05-02 DIAGNOSIS — E785 Hyperlipidemia, unspecified: Secondary | ICD-10-CM | POA: Diagnosis not present

## 2016-05-02 DIAGNOSIS — Z125 Encounter for screening for malignant neoplasm of prostate: Secondary | ICD-10-CM | POA: Diagnosis not present

## 2016-05-02 DIAGNOSIS — N400 Enlarged prostate without lower urinary tract symptoms: Secondary | ICD-10-CM

## 2016-05-02 DIAGNOSIS — Z1211 Encounter for screening for malignant neoplasm of colon: Secondary | ICD-10-CM

## 2016-05-02 LAB — CBC WITH DIFFERENTIAL/PLATELET
BASOS ABS: 0 {cells}/uL (ref 0–200)
Basophils Relative: 0 %
EOS ABS: 52 {cells}/uL (ref 15–500)
Eosinophils Relative: 1 %
HEMATOCRIT: 47.2 % (ref 38.5–50.0)
HEMOGLOBIN: 15.6 g/dL (ref 13.2–17.1)
LYMPHS ABS: 1508 {cells}/uL (ref 850–3900)
Lymphocytes Relative: 29 %
MCH: 29.7 pg (ref 27.0–33.0)
MCHC: 33.1 g/dL (ref 32.0–36.0)
MCV: 89.9 fL (ref 80.0–100.0)
MONO ABS: 364 {cells}/uL (ref 200–950)
MONOS PCT: 7 %
MPV: 11.2 fL (ref 7.5–12.5)
NEUTROS ABS: 3276 {cells}/uL (ref 1500–7800)
Neutrophils Relative %: 63 %
Platelets: 143 10*3/uL (ref 140–400)
RBC: 5.25 MIL/uL (ref 4.20–5.80)
RDW: 13.7 % (ref 11.0–15.0)
WBC: 5.2 10*3/uL (ref 3.8–10.8)

## 2016-05-02 LAB — BASIC METABOLIC PANEL WITH GFR
BUN: 15 mg/dL (ref 7–25)
CHLORIDE: 106 mmol/L (ref 98–110)
CO2: 26 mmol/L (ref 20–31)
CREATININE: 1.13 mg/dL (ref 0.70–1.18)
Calcium: 9.1 mg/dL (ref 8.6–10.3)
GFR, Est African American: 74 mL/min (ref >=60)
GFR, Est Non African American: 64 mL/min (ref >=60)
GLUCOSE: 146 mg/dL — AB (ref 65–99)
POTASSIUM: 4.3 mmol/L (ref 3.5–5.3)
Sodium: 141 mmol/L (ref 135–146)

## 2016-05-02 LAB — LIPID PANEL
CHOL/HDL RATIO: 3.1 ratio (ref <=5.0)
Cholesterol: 163 mg/dL (ref 125–200)
HDL: 53 mg/dL (ref >=40)
LDL CALC: 94 mg/dL (ref <130)
Triglycerides: 81 mg/dL (ref <150)
VLDL: 16 mg/dL (ref <30)

## 2016-05-02 LAB — MAGNESIUM: MAGNESIUM: 2 mg/dL (ref 1.5–2.5)

## 2016-05-02 LAB — TSH: TSH: 1.74 m[IU]/L (ref 0.40–4.50)

## 2016-05-02 LAB — HEPATIC FUNCTION PANEL
ALBUMIN: 4.1 g/dL (ref 3.6–5.1)
ALK PHOS: 57 U/L (ref 40–115)
ALT: 13 U/L (ref 9–46)
AST: 11 U/L (ref 10–35)
Bilirubin, Direct: 0.1 mg/dL (ref <=0.2)
Indirect Bilirubin: 0.6 mg/dL (ref 0.2–1.2)
Total Bilirubin: 0.7 mg/dL (ref 0.2–1.2)
Total Protein: 6.3 g/dL (ref 6.1–8.1)

## 2016-05-02 NOTE — Progress Notes (Signed)
[place]   Unk Pinto, M.D.    Uvaldo Bristle. Silverio Lay, P.A.-C      Starlyn Skeans, P.A.-C   [facility]                [address] [place] [address] Telephone [phone] Telefax [phone]  Comprehensive Evaluation & Examination     This very nice 75y.o.MWM presents for a comprehensive evaluation and management of multiple medical co-morbidities.  Patient has been followed for HTN, T2_NIDDM  Prediabetes, Hyperlipidemia and Vitamin D Deficiency.     Patient has labile HTN predates since 2010. Patient's BP has been controlled at home.Today's BP is 122/72. Patient denies any cardiac symptoms as chest pain, palpitations, shortness of breath, dizziness or ankle swelling.     Patient's hyperlipidemia is controlled with diet and medications. Patient denies myalgias or other medication SE's. Last lipids were at goal: Lab Results  Component Value Date   CHOL 160 01/20/2016   HDL 48 01/20/2016   LDLCALC 99 01/20/2016   TRIG 64 01/20/2016   CHOLHDL 3.3 01/20/2016      Patient has preDiabetes predating since 2011 with A1c 6.1%  And crossed the line to T2_NIDDM since  July 2015 with A1c 6.5%  And 6.7% in Feb 2017 and he's been attempting dietary management.  Patient denies reactive hypoglycemic symptoms, visual blurring, diabetic polys or paresthesias. Last A1c was still not at goal: Lab Results  Component Value Date   HGBA1C 6.7 (H) 01/20/2016       Finally, patient has history of Vitamin D Deficiency of "29" in 2008  and last vitamin D was at goal: Lab Results  Component Value Date   VD25OH 91 10/20/2015   Current Outpatient Prescriptions on File Prior to Visit  Medication Sig  . aspirin 81 MG tablet Take 81 mg by mouth daily.  . Cholecalciferol (VITAMIN D PO) Take 5,000 Units by mouth daily.  . Omega-3 Fatty Acids (FISH OIL) 1000 MG CAPS Take 1 capsule by mouth daily.  . pantoprazole  (PROTONIX) 40 MG tablet TAKE ONE TABLET BY MOUTH ONCE DAILY FOR  ACID  INDIGESTION  . pravastatin (PRAVACHOL) 40 MG tablet Take 1 tablet (40 mg total) by mouth daily.  . sildenafil (REVATIO) 20 MG tablet TAKE ONE TO FIVE TABLETS BY MOUTH ONE HOUR BEFORE NEEDED  . tamsulosin (FLOMAX) 0.4 MG CAPS capsule Take 1 capsule (0.4 mg total) by mouth daily.   No current facility-administered medications on file prior to visit.    Allergies  Allergen Reactions  . Bystolic [Nebivolol Hcl]    Past Medical History:  Diagnosis Date  . BPH (benign prostatic hyperplasia)   . ED (erectile dysfunction)   . GERD (gastroesophageal reflux disease)   . Hyperlipidemia   . Hypertension   . IBS (irritable bowel syndrome)   . LBBB (left bundle branch block)   . Personal history of kidney stones   . Prediabetes   . Vitamin D deficiency    Health Maintenance  Topic Date Due  . OPHTHALMOLOGY EXAM  03/01/1952  . MAMMOGRAPHY BRCA POSITIVE  03/01/1972  . FOOT EXAM  07/26/2015  . URINE MICROALBUMIN  04/08/2016  . INFLUENZA VACCINE  04/12/2016  . HEMOGLOBIN A1C  07/22/2016  . COLONOSCOPY  03/23/2022  . TETANUS/TDAP  04/08/2024  . ZOSTAVAX  Completed  . PNA vac Low Risk Adult  Completed  Immunization History  Administered Date(s) Administered  . DT 04/08/2014  . Influenza, High Dose Seasonal PF 06/24/2014, 07/28/2015  . Influenza-Unspecified 07/02/2013  . Pneumococcal Conjugate-13 04/09/2015  . Pneumococcal-Unspecified 10/02/2007  . Td 10/02/2003  . Zoster 10/01/2006   Past Surgical History:  Procedure Laterality Date  . CATARACT EXTRACTION W/ INTRAOCULAR LENS IMPLANT  2013   left  . KIDNEY STONE SURGERY     Family History  Problem Relation Age of Onset  . Colon cancer Neg Hx   . Diabetes Sister   . Hypertension Sister   . Heart disease Mother   . Diabetes Sister   . Hypertension Sister     Social History   Social History  . Marital status: Married    Spouse name: N/A  . Number of  children: N/A  . Years of education: N/A   Occupational History  . 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 Smoking status: Former Smoker    Packs/day: 0.50    Years: 10.00    Quit date: 09/13/1967  . Smokeless tobacco: Never Used  . Alcohol use No  . Drug use: No  . Sexual activity: active    ROS Constitutional: Denies fever, chills, weight loss/gain, headaches, insomnia,  night sweats or change in appetite. Does c/o fatigue. Eyes: Denies redness, blurred vision, diplopia, discharge, itchy or watery eyes.  ENT: Denies discharge, congestion, post nasal drip, epistaxis, sore throat, earache, hearing loss, dental pain, Tinnitus, Vertigo, Sinus pain or snoring.  Cardio: Denies chest pain, palpitations, irregular heartbeat, syncope, dyspnea, diaphoresis, orthopnea, PND, claudication or edema Respiratory: denies cough, dyspnea, DOE, pleurisy, hoarseness, laryngitis or wheezing.  Gastrointestinal: Denies dysphagia, heartburn, reflux, water brash, pain, cramps, nausea, vomiting, bloating, diarrhea, constipation, hematemesis, melena, hematochezia, jaundice or hemorrhoids Genitourinary: Denies dysuria, frequency, urgency, nocturia, hesitancy, discharge, hematuria or flank pain Musculoskeletal: Denies arthralgia, myalgia, stiffness, Jt. Swelling, pain, limp or strain/sprain. Denies Falls. Skin: Denies puritis, rash, hives, warts, acne, eczema or change in skin lesion Neuro: No weakness, tremor, incoordination, spasms, paresthesia or pain Psychiatric: Denies confusion, memory loss or sensory loss. Denies Depression. Endocrine: Denies change in weight, skin, hair change, nocturia, and paresthesia, diabetic polys, visual blurring or hyper / hypo glycemic episodes.  Heme/Lymph: No excessive bleeding, bruising or enlarged lymph nodes.  Physical Exam  BP 122/72   Pulse 60   Temp 97.7 F (36.5 C)   Resp 16   Ht 5' 8.75" (1.746 m)   Wt 183 lb 9.6 oz (83.3 kg)    BMI 27.31 kg/m   General Appearance: Well nourished, in no apparent distress.  Eyes: PERRLA, EOMs, conjunctiva no swelling or erythema, normal fundi and vessels. Sinuses: No frontal/maxillary tenderness ENT/Mouth: EACs patent / TMs  nl. Nares clear without erythema, swelling, mucoid exudates. Oral hygiene is good. No erythema, swelling, or exudate. Tongue normal, non-obstructing. Tonsils not swollen or erythematous. Hearing normal.  Neck: Supple, thyroid normal. No bruits, nodes or JVD. Respiratory: Respiratory effort normal.  BS equal and clear bilateral without rales, rhonci, wheezing or stridor. Cardio: Heart sounds are normal with regular rate and rhythm and no murmurs, rubs or gallops. Peripheral pulses are normal and equal bilaterally without edema. No aortic or femoral bruits. Chest: symmetric with normal excursions and percussion.  Abdomen: Soft, with Nl bowel sounds. Nontender, no guarding, rebound, hernias, masses, or organomegaly.  Lymphatics: Non tender without lymphadenopathy.  Genitourinary: No hernias.Testes nl. DRE - prostate nl for age - smooth & firm  w/o nodules. Musculoskeletal: Full ROM all peripheral extremities, joint stability, 5/5 strength, and normal gait. Skin: Warm and dry without rashes, lesions, cyanosis, clubbing or  ecchymosis.  Neuro: Cranial nerves intact, reflexes equal bilaterally. Normal muscle tone, no cerebellar symptoms. Sensation intact to touch, Vibratory and Monofilament to the toes bilaterally. Pysch: Alert and oriented X 3 with normal affect, insight and judgment appropriate.   Assessment and Plan  1. Essential hypertension  - Microalbumin / creatinine urine ratio - EKG 12-Lead - [place], RETROPERITNL ABD,  LTD - TSH  2. Hyperlipidemia  - EKG 12-Lead - [place], RETROPERITNL ABD,  LTD - Lipid panel - TSH  3. T2_NIDDM  - Microalbumin / creatinine urine ratio - EKG 12-Lead - [place], RETROPERITNL ABD,  LTD - HM DIABETES FOOT EXAM - LOW EXTREMITY  NEUR EXAM DOCUM - Hemoglobin A1c - Insulin, random  4. Vitamin D deficiency  - VITAMIN D 25 Hydroxy   5. Obesity   6. Gastroesophageal reflux disease   7. BPH (benign prostatic hyperplasia)   8. Colon cancer screening  - POC Hemoccult Bld/Stl   9. Prostate cancer screening  - PSA  10. Screening for ischemic heart disease  - EKG 12-Lead  11. Screening for AAA (aortic abdominal aneurysm)  - [place], RETROPERITNL ABD,  LTD  12. Medication management  - Urinalysis, Routine w reflex microscopic  - CBC with Differential/Platelet - BASIC METABOLIC PANEL WITH GFR - Hepatic function panel - Magnesium   Continue prudent diet as discussed, weight control, BP monitoring, regular exercise, and medications as discussed.  Discussed med effects and SE's. Routine screening labs and tests as requested with regular follow-up as recommended. Over 40 minutes of exam, counseling, chart review and high complex critical decision making was performed

## 2016-05-02 NOTE — Patient Instructions (Signed)

## 2016-05-03 LAB — HEMOGLOBIN A1C
Hgb A1c MFr Bld: 6.4 % — ABNORMAL HIGH (ref <5.7)
Mean Plasma Glucose: 137 mg/dL

## 2016-05-03 LAB — URINALYSIS, ROUTINE W REFLEX MICROSCOPIC
BILIRUBIN URINE: NEGATIVE
Glucose, UA: NEGATIVE
Hgb urine dipstick: NEGATIVE
KETONES UR: NEGATIVE
LEUKOCYTES UA: NEGATIVE
NITRITE: NEGATIVE
PROTEIN: NEGATIVE
SPECIFIC GRAVITY, URINE: 1.015 (ref 1.001–1.035)
pH: 6 (ref 5.0–8.0)

## 2016-05-03 LAB — PSA: PSA: 2.8 ng/mL (ref <=4.0)

## 2016-05-03 LAB — MICROALBUMIN / CREATININE URINE RATIO
Creatinine, Urine: 105 mg/dL (ref 20–370)
Microalb Creat Ratio: 5 mcg/mg creat (ref <30)
Microalb, Ur: 0.5 mg/dL

## 2016-05-03 LAB — INSULIN, RANDOM: INSULIN: 8.5 u[IU]/mL (ref 2.0–19.6)

## 2016-05-03 LAB — VITAMIN D 25 HYDROXY (VIT D DEFICIENCY, FRACTURES): VIT D 25 HYDROXY: 81 ng/mL (ref 30–100)

## 2016-08-11 ENCOUNTER — Encounter: Payer: Self-pay | Admitting: Internal Medicine

## 2016-08-11 ENCOUNTER — Ambulatory Visit (INDEPENDENT_AMBULATORY_CARE_PROVIDER_SITE_OTHER): Payer: PPO | Admitting: Internal Medicine

## 2016-08-11 VITALS — BP 128/76 | HR 58 | Temp 98.1°F | Wt 185.0 lb

## 2016-08-11 DIAGNOSIS — N400 Enlarged prostate without lower urinary tract symptoms: Secondary | ICD-10-CM | POA: Diagnosis not present

## 2016-08-11 DIAGNOSIS — E559 Vitamin D deficiency, unspecified: Secondary | ICD-10-CM | POA: Diagnosis not present

## 2016-08-11 DIAGNOSIS — E782 Mixed hyperlipidemia: Secondary | ICD-10-CM

## 2016-08-11 DIAGNOSIS — I10 Essential (primary) hypertension: Secondary | ICD-10-CM

## 2016-08-11 DIAGNOSIS — E119 Type 2 diabetes mellitus without complications: Secondary | ICD-10-CM | POA: Diagnosis not present

## 2016-08-11 DIAGNOSIS — K219 Gastro-esophageal reflux disease without esophagitis: Secondary | ICD-10-CM | POA: Diagnosis not present

## 2016-08-11 DIAGNOSIS — Z79899 Other long term (current) drug therapy: Secondary | ICD-10-CM

## 2016-08-11 LAB — CBC WITH DIFFERENTIAL/PLATELET
BASOS PCT: 0 %
Basophils Absolute: 0 cells/uL (ref 0–200)
EOS ABS: 94 {cells}/uL (ref 15–500)
Eosinophils Relative: 2 %
HEMATOCRIT: 47.6 % (ref 38.5–50.0)
HEMOGLOBIN: 15.3 g/dL (ref 13.2–17.1)
LYMPHS ABS: 1363 {cells}/uL (ref 850–3900)
LYMPHS PCT: 29 %
MCH: 29.7 pg (ref 27.0–33.0)
MCHC: 32.1 g/dL (ref 32.0–36.0)
MCV: 92.4 fL (ref 80.0–100.0)
MONO ABS: 376 {cells}/uL (ref 200–950)
MPV: 11.7 fL (ref 7.5–12.5)
Monocytes Relative: 8 %
Neutro Abs: 2867 cells/uL (ref 1500–7800)
Neutrophils Relative %: 61 %
Platelets: 153 10*3/uL (ref 140–400)
RBC: 5.15 MIL/uL (ref 4.20–5.80)
RDW: 13.6 % (ref 11.0–15.0)
WBC: 4.7 10*3/uL (ref 3.8–10.8)

## 2016-08-11 LAB — BASIC METABOLIC PANEL WITH GFR
BUN: 16 mg/dL (ref 7–25)
CHLORIDE: 105 mmol/L (ref 98–110)
CO2: 27 mmol/L (ref 20–31)
Calcium: 8.7 mg/dL (ref 8.6–10.3)
Creat: 1.2 mg/dL — ABNORMAL HIGH (ref 0.70–1.18)
GFR, EST NON AFRICAN AMERICAN: 59 mL/min — AB (ref >=60)
GFR, Est African American: 68 mL/min (ref >=60)
GLUCOSE: 145 mg/dL — AB (ref 65–99)
POTASSIUM: 4.3 mmol/L (ref 3.5–5.3)
Sodium: 139 mmol/L (ref 135–146)

## 2016-08-11 LAB — HEPATIC FUNCTION PANEL
ALBUMIN: 4 g/dL (ref 3.6–5.1)
ALK PHOS: 53 U/L (ref 40–115)
ALT: 19 U/L (ref 9–46)
AST: 13 U/L (ref 10–35)
BILIRUBIN INDIRECT: 0.7 mg/dL (ref 0.2–1.2)
Bilirubin, Direct: 0.1 mg/dL (ref <=0.2)
TOTAL PROTEIN: 6.1 g/dL (ref 6.1–8.1)
Total Bilirubin: 0.8 mg/dL (ref 0.2–1.2)

## 2016-08-11 LAB — LIPID PANEL
Cholesterol: 158 mg/dL (ref <200)
HDL: 49 mg/dL (ref >40)
LDL CALC: 94 mg/dL (ref <100)
Total CHOL/HDL Ratio: 3.2 Ratio (ref <5.0)
Triglycerides: 76 mg/dL (ref <150)
VLDL: 15 mg/dL (ref <30)

## 2016-08-11 MED ORDER — PANTOPRAZOLE SODIUM 40 MG PO TBEC: DELAYED_RELEASE_TABLET | ORAL | 3 refills | Status: DC

## 2016-08-11 MED ORDER — PRAVASTATIN SODIUM 40 MG PO TABS: 40.0000 mg | ORAL_TABLET | Freq: Every day | ORAL | 3 refills | Status: DC

## 2016-08-11 MED ORDER — TAMSULOSIN HCL 0.4 MG PO CAPS: 0.4000 mg | ORAL_CAPSULE | Freq: Every day | ORAL | 3 refills | Status: DC

## 2016-08-11 NOTE — Progress Notes (Signed)
Assessment and Plan:  Hypertension:  -Continue medication,  -monitor blood pressure at home.  -Continue DASH diet.   -Reminder to go to the ER if any CP, SOB, nausea, dizziness, severe HA, changes vision/speech, left arm numbness and tingling, and jaw pain.  Cholesterol: -Continue diet and exercise.  -Check cholesterol.   Pre-diabetes: -Continue diet and exercise.  -Check A1c   Vitamin D Def: -continue medications.    GERD -cont pantoprazole -avoid trigger foods    Continue diet and meds as discussed. Further disposition pending results of labs.  HPI 75 y.o. male  presents for 3 month follow up with hypertension, hyperlipidemia, prediabetes and vitamin D.   His blood pressure has been controlled at home, today their BP is BP: 128/76.   He does not workout. He denies chest pain, shortness of breath, dizziness.   He is on cholesterol medication and denies myalgias. His cholesterol is at goal. The cholesterol last visit was:   Lab Results  Component Value Date   CHOL 163 05/02/2016   HDL 53 05/02/2016   LDLCALC 94 05/02/2016   TRIG 81 05/02/2016   CHOLHDL 3.1 05/02/2016     He has been working on diet and exercise for prediabetes, and denies foot ulcerations, hyperglycemia, hypoglycemia , increased appetite, nausea, paresthesia of the feet, polydipsia, polyuria, visual disturbances, vomiting and weight loss. Last A1C in the office was:  Lab Results  Component Value Date   HGBA1C 6.4 (H) 05/02/2016    Patient is on Vitamin D supplement.  Lab Results  Component Value Date   VD25OH 55 05/02/2016      He reports that he has not been having any heart burn recently.  Still taking the protonix regularly.    He is still doing yard work and getting his exercise in that way.    He reports that he thinks that he had his eyes checked early this year.  He does not remember who he sees.  He thinks it is a male.  He reports that it is one [employer]. [employer].     Current Medications:  Current Outpatient Prescriptions on File Prior to Visit  Medication Sig Dispense Refill  . aspirin 81 MG tablet Take 81 mg by mouth daily.    . Cholecalciferol (VITAMIN D PO) Take 5,000 Units by mouth daily.    . Omega-3 Fatty Acids (FISH OIL) 1000 MG CAPS Take 1 capsule by mouth daily.    . sildenafil (REVATIO) 20 MG tablet TAKE ONE TO FIVE TABLETS BY MOUTH ONE HOUR BEFORE NEEDED 90 tablet 3   No current facility-administered medications on file prior to visit.     Medical History:  Past Medical History:  Diagnosis Date  . BPH (benign prostatic hyperplasia)   . ED (erectile dysfunction)   . GERD (gastroesophageal reflux disease)   . Hyperlipidemia   . Hypertension   . IBS (irritable bowel syndrome)   . LBBB (left bundle branch block)   . Personal history of kidney stones   . Prediabetes   . Vitamin D deficiency     Allergies:  Allergies  Allergen Reactions  . Bystolic [Nebivolol Hcl]      Review of Systems:  Review of Systems  Constitutional: Negative for chills, fever and malaise/fatigue.  HENT: Negative for congestion, ear pain and sore throat.   Eyes: Negative.   Respiratory: Negative for cough, shortness of breath and wheezing.   Cardiovascular: Negative for chest pain, palpitations and leg swelling.  Gastrointestinal: Negative for  abdominal pain, blood in stool, constipation, diarrhea, heartburn and melena.  Genitourinary: Negative.   Skin: Negative.   Neurological: Negative for dizziness, sensory change, loss of consciousness and headaches.  Psychiatric/Behavioral: Negative for depression. The patient is not nervous/anxious and does not have insomnia.     Family history- Review and unchanged  Social history- Review and unchanged  Physical Exam: BP 128/76   Pulse (!) 58   Temp 98.1 F (36.7 C)   Wt 185 lb (83.9 kg)   SpO2 98%   BMI 27.52 kg/m  Wt Readings from Last 3 Encounters:  08/11/16 185 lb (83.9 kg)  05/02/16 183 lb  9.6 oz (83.3 kg)  01/20/16 181 lb 3.2 oz (82.2 kg)    General Appearance: Well nourished well developed, in no apparent distress. Eyes: PERRLA, EOMs, conjunctiva no swelling or erythema ENT/Mouth: Ear canals normal without obstruction, swelling, erythma, discharge.  TMs normal bilaterally.  Oropharynx moist, clear, without exudate, or postoropharyngeal swelling. Neck: Supple, thyroid normal,no cervical adenopathy  Respiratory: Respiratory effort normal, Breath sounds clear A&P without rhonchi, wheeze, or rale.  No retractions, no accessory usage. Cardio: RRR with no MRGs. Brisk peripheral pulses without edema.  Abdomen: Soft, + BS,  Non tender, no guarding, rebound, hernias, masses. Musculoskeletal: Full ROM, 5/5 strength, Normal gait Skin: Warm, dry without rashes, lesions, ecchymosis.  Neuro: Awake and oriented X 3, Cranial nerves intact. Normal muscle tone, no cerebellar symptoms. Psych: Normal affect, Insight and Judgment appropriate.    Starlyn Skeans, PA-C 1:48 PM [facility]

## 2016-08-12 LAB — HEMOGLOBIN A1C
HEMOGLOBIN A1C: 6.5 % — AB (ref <5.7)
MEAN PLASMA GLUCOSE: 140 mg/dL

## 2016-08-12 LAB — TSH: TSH: 1.54 mIU/L (ref 0.40–4.50)

## 2016-11-16 ENCOUNTER — Ambulatory Visit (INDEPENDENT_AMBULATORY_CARE_PROVIDER_SITE_OTHER): Payer: PPO | Admitting: Internal Medicine

## 2016-11-16 ENCOUNTER — Encounter: Payer: Self-pay | Admitting: Internal Medicine

## 2016-11-16 VITALS — BP 118/72 | HR 56 | Temp 97.6°F | Resp 16 | Ht 68.75 in | Wt 186.2 lb

## 2016-11-16 DIAGNOSIS — Z79899 Other long term (current) drug therapy: Secondary | ICD-10-CM

## 2016-11-16 DIAGNOSIS — E119 Type 2 diabetes mellitus without complications: Secondary | ICD-10-CM | POA: Diagnosis not present

## 2016-11-16 DIAGNOSIS — I10 Essential (primary) hypertension: Secondary | ICD-10-CM | POA: Diagnosis not present

## 2016-11-16 DIAGNOSIS — E782 Mixed hyperlipidemia: Secondary | ICD-10-CM

## 2016-11-16 DIAGNOSIS — K219 Gastro-esophageal reflux disease without esophagitis: Secondary | ICD-10-CM | POA: Diagnosis not present

## 2016-11-16 DIAGNOSIS — E559 Vitamin D deficiency, unspecified: Secondary | ICD-10-CM | POA: Diagnosis not present

## 2016-11-16 LAB — CBC WITH DIFFERENTIAL/PLATELET
BASOS ABS: 0 {cells}/uL (ref 0–200)
Basophils Relative: 0 %
EOS PCT: 2 %
Eosinophils Absolute: 86 cells/uL (ref 15–500)
HCT: 46.5 % (ref 38.5–50.0)
Hemoglobin: 15.6 g/dL (ref 13.2–17.1)
Lymphocytes Relative: 32 %
Lymphs Abs: 1376 cells/uL (ref 850–3900)
MCH: 30.4 pg (ref 27.0–33.0)
MCHC: 33.5 g/dL (ref 32.0–36.0)
MCV: 90.6 fL (ref 80.0–100.0)
MPV: 11.1 fL (ref 7.5–12.5)
Monocytes Absolute: 430 cells/uL (ref 200–950)
Monocytes Relative: 10 %
NEUTROS ABS: 2408 {cells}/uL (ref 1500–7800)
Neutrophils Relative %: 56 %
PLATELETS: 150 10*3/uL (ref 140–400)
RBC: 5.13 MIL/uL (ref 4.20–5.80)
RDW: 13.7 % (ref 11.0–15.0)
WBC: 4.3 10*3/uL (ref 3.8–10.8)

## 2016-11-16 LAB — LIPID PANEL
CHOL/HDL RATIO: 3.2 ratio (ref <5.0)
CHOLESTEROL: 170 mg/dL (ref <200)
HDL: 53 mg/dL (ref >40)
LDL Cholesterol: 104 mg/dL — ABNORMAL HIGH (ref <100)
Triglycerides: 64 mg/dL (ref <150)
VLDL: 13 mg/dL (ref <30)

## 2016-11-16 LAB — BASIC METABOLIC PANEL WITH GFR
BUN: 17 mg/dL (ref 7–25)
CALCIUM: 8.9 mg/dL (ref 8.6–10.3)
CHLORIDE: 107 mmol/L (ref 98–110)
CO2: 24 mmol/L (ref 20–31)
CREATININE: 1.19 mg/dL — AB (ref 0.70–1.18)
GFR, EST NON AFRICAN AMERICAN: 60 mL/min (ref >=60)
GFR, Est African American: 69 mL/min (ref >=60)
Glucose, Bld: 136 mg/dL — ABNORMAL HIGH (ref 65–99)
Potassium: 4.2 mmol/L (ref 3.5–5.3)
Sodium: 139 mmol/L (ref 135–146)

## 2016-11-16 LAB — HEPATIC FUNCTION PANEL
ALT: 20 U/L (ref 9–46)
AST: 15 U/L (ref 10–35)
Albumin: 4.2 g/dL (ref 3.6–5.1)
Alkaline Phosphatase: 63 U/L (ref 40–115)
BILIRUBIN DIRECT: 0.2 mg/dL (ref <=0.2)
Indirect Bilirubin: 0.7 mg/dL (ref 0.2–1.2)
Total Bilirubin: 0.9 mg/dL (ref 0.2–1.2)
Total Protein: 6.6 g/dL (ref 6.1–8.1)

## 2016-11-16 LAB — TSH: TSH: 1.19 mIU/L (ref 0.40–4.50)

## 2016-11-16 LAB — HEMOGLOBIN A1C
Hgb A1c MFr Bld: 6.4 % — ABNORMAL HIGH (ref <5.7)
Mean Plasma Glucose: 137 mg/dL

## 2016-11-16 LAB — MAGNESIUM: MAGNESIUM: 2.1 mg/dL (ref 1.5–2.5)

## 2016-11-16 NOTE — Patient Instructions (Signed)

## 2016-11-16 NOTE — Progress Notes (Signed)
[facility] Unk Pinto, M.D.        Uvaldo Bristle. Silverio Lay, P.A.-C       Starlyn Skeans, P.A.-C  [facility]                [address] [place] [address] Telephone [phone] Telefax [phone] ______________________________________________________________________     This very nice 76 y.o. mwm presents for 6 month follow up with Hypertension, Hyperlipidemia, Pre-Diabetes and Vitamin D Deficiency. Patient also has GERD controlled with diet & meds.      Patient is treated for HTN (2010) & BP has been controlled at home. Today's BP is at goal - 118/72. Patient has had no complaints of any cardiac type chest pain, palpitations, dyspnea/orthopnea/PND, dizziness, claudication, or dependent edema.     Hyperlipidemia is controlled with diet & meds. Patient denies myalgias or other med SE's. Last Lipids were at goal: Lab Results  Component Value Date   CHOL 158 08/11/2016   HDL 49 08/11/2016   LDLCALC 94 08/11/2016   TRIG 76 08/11/2016   CHOLHDL 3.2 08/11/2016      Also, the patient has history of Prediabetes (A1c 6.1% in 2011) and then T2_DM with A1c 6.5% in 2015 and 6.7% in 2/2017and hes attempting dietary mgmt to avoid taking more meds.  T2_NIDDM and has had no symptoms of reactive hypoglycemia, diabetic polys, paresthesias or visual blurring.  Last A1c was not at goal: Lab Results  Component Value Date   HGBA1C 6.5 (H) 08/11/2016      Further, the patient also has history of Vitamin D Deficiency ("29" in 2008) and supplements vitamin D without any suspected side-effects. Last vitamin D was at goal: Lab Results  Component Value Date   VD25OH 81 05/02/2016   Current Outpatient Prescriptions on File Prior to Visit  Medication Sig  . aspirin 81 MG Take  daily.  Marland Kitchen VITAMIN D   5,000 Units Take  daily.  . Omega-3 FFISH OIL 1000 MG Take 1 cap daily.  . pantoprazole  40 MG  TAKE ONE TAB DAILY    . pravastatin 40 MG  Take 1 tab daily.  . Sildenafil 20 MG  TAKE 1-5 TAB 1 Hr as NEEDED  . tamsulosin  0.4 MG  Take 1 cap  daily.   Allergies  Allergen Reactions  . Bystolic [Nebivolol Hcl]    PMHx:   Past Medical History:  Diagnosis Date  . BPH (benign prostatic hyperplasia)   . ED (erectile dysfunction)   . GERD (gastroesophageal reflux disease)   . Hyperlipidemia   . Hypertension   . IBS (irritable bowel syndrome)   . LBBB (left bundle branch block)   . Personal history of kidney stones   . Prediabetes   . Vitamin D deficiency    Immunization History  Administered Date(s) Administered  . DT 04/08/2014  . Influenza, High Dose Seasonal PF 06/24/2014, 07/28/2015, 05/02/2016  . Influenza-Unspecified 07/02/2013  . Pneumococcal Conjugate-13 04/09/2015  . Pneumococcal-Unspecified 10/02/2007  . Td 10/02/2003  . Zoster 10/01/2006   Past Surgical History:  Procedure Laterality Date  . CATARACT EXTRACTION W/ INTRAOCULAR LENS IMPLANT  2013   left  . KIDNEY STONE SURGERY     FHx:    Reviewed / unchanged  SHx:    Reviewed / unchanged  Systems Review:  Constitutional: Denies fever, chills, wt changes, headaches, insomnia, fatigue, night  sweats, change in appetite. Eyes: Denies redness, blurred vision, diplopia, discharge, itchy, watery eyes.  ENT: Denies discharge, congestion, post nasal drip, epistaxis, sore throat, earache, hearing loss, dental pain, tinnitus, vertigo, sinus pain, snoring.  CV: Denies chest pain, palpitations, irregular heartbeat, syncope, dyspnea, diaphoresis, orthopnea, PND, claudication or edema. Respiratory: denies cough, dyspnea, DOE, pleurisy, hoarseness, laryngitis, wheezing.  Gastrointestinal: Denies dysphagia, odynophagia, heartburn, reflux, water brash, abdominal pain or cramps, nausea, vomiting, bloating, diarrhea, constipation, hematemesis, melena, hematochezia  or hemorrhoids. Genitourinary: Denies dysuria, frequency, urgency, nocturia,  hesitancy, discharge, hematuria or flank pain. Musculoskeletal: Denies arthralgias, myalgias, stiffness, jt. swelling, pain, limping or strain/sprain.  Skin: Denies pruritus, rash, hives, warts, acne, eczema or change in skin lesion(s). Neuro: No weakness, tremor, incoordination, spasms, paresthesia or pain. Psychiatric: Denies confusion, memory loss or sensory loss. Endo: Denies change in weight, skin or hair change.  Heme/Lymph: No excessive bleeding, bruising or enlarged lymph nodes.  Physical Exam  BP 118/72   P  56   T 97.6 F   R 16   Ht 5' 8.75"    Wt 186 lb    BMI 27.70   Appears well nourished and in no distress.  Eyes: PERRLA, EOMs, conjunctiva no swelling or erythema. Sinuses: No frontal/maxillary tenderness ENT/Mouth: EAC's clear, TM's nl w/o erythema, bulging. Nares clear w/o erythema, swelling, exudates. Oropharynx clear without erythema or exudates. Oral hygiene is good. Tongue normal, non obstructing. Hearing intact.  Neck: Supple. Thyroid nl. Car 2+/2+ without bruits, nodes or JVD. Chest: Respirations nl with BS clear & equal w/o rales, rhonchi, wheezing or stridor.  Cor: Heart sounds normal w/ regular rate and rhythm without sig. murmurs, gallops, clicks, or rubs. Peripheral pulses normal and equal  without edema.  Abdomen: Soft & bowel sounds normal. Non-tender w/o guarding, rebound, hernias, masses, or organomegaly.  Lymphatics: Unremarkable.  Musculoskeletal: Full ROM all peripheral extremities, joint stability, 5/5 strength, and normal gait.  Skin: Warm, dry without exposed rashes, lesions or ecchymosis apparent.  Neuro: Cranial nerves intact, reflexes equal bilaterally. Sensory-motor testing grossly intact. Tendon reflexes grossly intact.  Pysch: Alert & oriented x 3.  Insight and judgement nl & appropriate. No ideations.  Assessment and Plan:  1. Essential hypertension  - Continue medication, monitor blood pressure at home.  - Continue DASH diet.  Reminder to go to the ER if any CP,  SOB, nausea, dizziness, severe HA, changes vision/speech,  left arm numbness and tingling and jaw pain - CBC with Differential/Platelet - BASIC METABOLIC PANEL WITH GFR - Magnesium - TSH  2. Mixed hyperlipidemia  - Continue diet/meds, exercise,& lifestyle modifications.  - Continue monitor periodic cholesterol/liver & renal functions  - Hepatic function panel - Lipid panel - TSH  3. T2_NIDDM  - Continue diet, exercise, lifestyle modifications.  - Monitor appropriate labs. - Hemoglobin A1c - Insulin, random  4. Vitamin D deficiency  - Continue supplementation. - VITAMIN D 25 Hydroxy   5. Gastroesophageal reflux disease   6. Medication management  - CBC with Differential/Platelet - BASIC METABOLIC PANEL WITH GFR - Hepatic function panel - Magnesium - Lipid panel - TSH - Hemoglobin A1c - Insulin, random - VITAMIN D 25 Hydroxy        Recommended regular exercise, BP monitoring, weight control, and discussed med and SE's. Recommended labs to assess and monitor clinical status. Further disposition pending results of labs. Over 30 minutes of exam, counseling, chart review was performed

## 2016-11-17 LAB — VITAMIN D 25 HYDROXY (VIT D DEFICIENCY, FRACTURES): VIT D 25 HYDROXY: 85 ng/mL (ref 30–100)

## 2016-11-17 LAB — INSULIN, RANDOM: Insulin: 4.8 u[IU]/mL (ref 2.0–19.6)

## 2016-12-14 ENCOUNTER — Other Ambulatory Visit: Payer: Self-pay | Admitting: Internal Medicine

## 2016-12-19 DIAGNOSIS — L570 Actinic keratosis: Secondary | ICD-10-CM | POA: Diagnosis not present

## 2016-12-19 DIAGNOSIS — D2271 Melanocytic nevi of right lower limb, including hip: Secondary | ICD-10-CM | POA: Diagnosis not present

## 2016-12-19 DIAGNOSIS — L820 Inflamed seborrheic keratosis: Secondary | ICD-10-CM | POA: Diagnosis not present

## 2016-12-19 DIAGNOSIS — D225 Melanocytic nevi of trunk: Secondary | ICD-10-CM | POA: Diagnosis not present

## 2016-12-19 DIAGNOSIS — L821 Other seborrheic keratosis: Secondary | ICD-10-CM | POA: Diagnosis not present

## 2017-02-07 ENCOUNTER — Encounter: Payer: Self-pay | Admitting: Gastroenterology

## 2017-02-16 ENCOUNTER — Ambulatory Visit: Payer: Self-pay | Admitting: Internal Medicine

## 2017-03-06 DIAGNOSIS — H02102 Unspecified ectropion of right lower eyelid: Secondary | ICD-10-CM | POA: Diagnosis not present

## 2017-03-06 DIAGNOSIS — H0012 Chalazion right lower eyelid: Secondary | ICD-10-CM | POA: Diagnosis not present

## 2017-03-06 DIAGNOSIS — H5203 Hypermetropia, bilateral: Secondary | ICD-10-CM | POA: Diagnosis not present

## 2017-03-06 DIAGNOSIS — H02105 Unspecified ectropion of left lower eyelid: Secondary | ICD-10-CM | POA: Diagnosis not present

## 2017-03-06 LAB — HM DIABETES EYE EXAM

## 2017-03-13 NOTE — Patient Instructions (Signed)

## 2017-03-13 NOTE — Progress Notes (Signed)
This very nice 76 y.o. MWM presents for 6 month follow up with Hypertension, Hyperlipidemia, Pre-Diabetes and Vitamin D Deficiency.      Patient is treated for HTN  (2010) & BP has been controlled at home. Today's BP is at goal - 130/80. Patient has had no complaints of any cardiac type chest pain, palpitations, dyspnea/orthopnea/PND, dizziness, claudication, or dependent edema.     Hyperlipidemia is controlled with diet & meds. Patient denies myalgias or other med SE's. Last Lipids were almost to goal: Lab Results  Component Value Date   CHOL 159 03/14/2017   HDL 45 03/14/2017   LDLCALC 97 03/14/2017   TRIG 86 03/14/2017   CHOLHDL 3.5 03/14/2017      Also, the patient has history of PreDiabetes (A1c 6.1% in 2011) and then T2_NIDDM (A1c 6.5% in 2015 and 6.7% in 10/2015) and has had no symptoms of reactive hypoglycemia, diabetic polys, paresthesias or visual blurring. Patient is attempting dietary control.   Last A1c was not at goal.  Lab Results  Component Value Date   HGBA1C 6.8 (H) 03/14/2017      Further, the patient also has history of Vitamin D Deficiency ("29" in 2008)  and supplements vitamin D without any suspected side-effects. Last vitamin D was at goal:   Lab Results  Component Value Date   VD25OH 58 03/14/2017   Current Outpatient Prescriptions on File Prior to Visit  Medication Sig  . aspirin 81 MG tablet Take 81 mg by mouth daily.  . Cholecalciferol (VITAMIN D PO) Take 5,000 Units by mouth daily.  . Omega-3 Fatty Acids (FISH OIL) 1000 MG CAPS Take 1 capsule by mouth daily.  . pantoprazole (PROTONIX) 40 MG tablet TAKE ONE TABLET BY MOUTH ONCE DAILY FOR  ACID  INDIGESTION  . pravastatin (PRAVACHOL) 40 MG tablet Take 1 tablet (40 mg total) by mouth daily.  . sildenafil (REVATIO) 20 MG tablet TAKE ONE TO FIVE TABLETS BY MOUTH ONE HOUR BEFORE NEEDED  . tamsulosin (FLOMAX) 0.4 MG CAPS capsule Take 1 capsule (0.4 mg total) by mouth daily.   No current  facility-administered medications on file prior to visit.    Allergies  Allergen Reactions  . Bystolic [Nebivolol Hcl]    PMHx:   Past Medical History:  Diagnosis Date  . BPH (benign prostatic hyperplasia)   . ED (erectile dysfunction)   . GERD (gastroesophageal reflux disease)   . Hyperlipidemia   . Hypertension   . IBS (irritable bowel syndrome)   . LBBB (left bundle branch block)   . Personal history of kidney stones   . Prediabetes   . Vitamin D deficiency    Immunization History  Administered Date(s) Administered  . DT 04/08/2014  . Influenza, High Dose Seasonal PF 06/24/2014, 07/28/2015, 05/02/2016  . Influenza-Unspecified 07/02/2013  . Pneumococcal Conjugate-13 04/09/2015  . Pneumococcal-Unspecified 10/02/2007  . Td 10/02/2003  . Zoster 10/01/2006   Past Surgical History:  Procedure Laterality Date  . CATARACT EXTRACTION W/ INTRAOCULAR LENS IMPLANT  2013   left  . KIDNEY STONE SURGERY     FHx:    Reviewed / unchanged  SHx:    Reviewed / unchanged  Systems Review:  Constitutional: Denies fever, chills, wt changes, headaches, insomnia, fatigue, night sweats, change in appetite. Eyes: Denies redness, blurred vision, diplopia, discharge, itchy, watery eyes.  ENT: Denies discharge, congestion, post nasal drip, epistaxis, sore throat, earache, hearing loss, dental pain, tinnitus, vertigo, sinus pain, snoring.  CV: Denies chest pain, palpitations,  irregular heartbeat, syncope, dyspnea, diaphoresis, orthopnea, PND, claudication or edema. Respiratory: denies cough, dyspnea, DOE, pleurisy, hoarseness, laryngitis, wheezing.  Gastrointestinal: Denies dysphagia, odynophagia, heartburn, reflux, water brash, abdominal pain or cramps, nausea, vomiting, bloating, diarrhea, constipation, hematemesis, melena, hematochezia  or hemorrhoids. Genitourinary: Denies dysuria, frequency, urgency, nocturia, hesitancy, discharge, hematuria or flank pain. Musculoskeletal: Denies arthralgias,  myalgias, stiffness, jt. swelling, pain, limping or strain/sprain.  Skin: Denies pruritus, rash, hives, warts, acne, eczema or change in skin lesion(s). Neuro: No weakness, tremor, incoordination, spasms, paresthesia or pain. Psychiatric: Denies confusion, memory loss or sensory loss. Endo: Denies change in weight, skin or hair change.  Heme/Lymph: No excessive bleeding, bruising or enlarged lymph nodes.  Physical Exam  BP 130/80   Pulse (!) 52   Temp 97.7 F (36.5 C)   Resp (!) 96   Ht 5' 8.75" (1.746 m)   Wt 182 lb (82.6 kg)   BMI 27.07 kg/m   Appears well nourished, well groomed  and in no distress.  Eyes: PERRLA, EOMs, conjunctiva no swelling or erythema. Sinuses: No frontal/maxillary tenderness ENT/Mouth: EAC's clear, TM's nl w/o erythema, bulging. Nares clear w/o erythema, swelling, exudates. Oropharynx clear without erythema or exudates. Oral hygiene is good. Tongue normal, non obstructing. Hearing intact.  Neck: Supple. Thyroid nl. Car 2+/2+ without bruits, nodes or JVD. Chest: Respirations nl with BS clear & equal w/o rales, rhonchi, wheezing or stridor.  Cor: Heart sounds normal w/ regular rate and rhythm without sig. murmurs, gallops, clicks or rubs. Peripheral pulses normal and equal  without edema.  Abdomen: Soft & bowel sounds normal. Non-tender w/o guarding, rebound, hernias, masses or organomegaly.  Lymphatics: Unremarkable.  Musculoskeletal: Full ROM all peripheral extremities, joint stability, 5/5 strength and normal gait.  Skin: Warm, dry without exposed rashes, lesions or ecchymosis apparent.  Neuro: Cranial nerves intact, reflexes equal bilaterally. Sensory-motor testing grossly intact. Tendon reflexes grossly intact.  Pysch: Alert & oriented x 3.  Insight and judgement nl & appropriate. No ideations.  Assessment and Plan:  1. Essential hypertension  - Continue medication, monitor blood pressure at home.  - Continue DASH diet. Reminder to go to the ER if  any CP,  SOB, nausea, dizziness, severe HA, changes vision/speech.  - CBC with Differential/Platelet - BASIC METABOLIC PANEL WITH GFR - Magnesium - TSH  2. Hyperlipidemia, mixed  - Continue diet/meds, exercise,& lifestyle modifications.  - Continue monitor periodic cholesterol/liver & renal functions   - Hepatic function panel - Lipid panel - TSH  3. T2_NIDDM  - Continue diet, exercise, lifestyle modifications.  - Monitor appropriate labs.  - Hemoglobin A1c - Insulin, random  4. Vitamin D deficiency  - Continue supplementation.  - VITAMIN D 25 Hydroxy   5. Medication management  - CBC with Differential/Platelet - BASIC METABOLIC PANEL WITH GFR - Hepatic function panel - Magnesium - Lipid panel - TSH - Hemoglobin A1c - Insulin, random - VITAMIN D 25 Hydroxy        Discussed  regular exercise, BP monitoring, weight control to achieve/maintain BMI less than 25 and discussed med and SE's. Recommended labs to assess and monitor clinical status with further disposition pending results of labs. Over 30 minutes of exam, counseling, chart review was performed.

## 2017-03-14 ENCOUNTER — Ambulatory Visit (INDEPENDENT_AMBULATORY_CARE_PROVIDER_SITE_OTHER): Payer: PPO | Admitting: Internal Medicine

## 2017-03-14 VITALS — BP 130/80 | HR 52 | Temp 97.7°F | Resp 96 | Ht 68.75 in | Wt 182.0 lb

## 2017-03-14 DIAGNOSIS — Z79899 Other long term (current) drug therapy: Secondary | ICD-10-CM

## 2017-03-14 DIAGNOSIS — I10 Essential (primary) hypertension: Secondary | ICD-10-CM

## 2017-03-14 DIAGNOSIS — E559 Vitamin D deficiency, unspecified: Secondary | ICD-10-CM

## 2017-03-14 DIAGNOSIS — E119 Type 2 diabetes mellitus without complications: Secondary | ICD-10-CM | POA: Diagnosis not present

## 2017-03-14 DIAGNOSIS — E782 Mixed hyperlipidemia: Secondary | ICD-10-CM

## 2017-03-14 LAB — BASIC METABOLIC PANEL WITH GFR
BUN: 17 mg/dL (ref 7–25)
CHLORIDE: 106 mmol/L (ref 98–110)
CO2: 23 mmol/L (ref 20–31)
CREATININE: 1.19 mg/dL — AB (ref 0.70–1.18)
Calcium: 8.7 mg/dL (ref 8.6–10.3)
GFR, Est African American: 69 mL/min (ref >=60)
GFR, Est Non African American: 59 mL/min — ABNORMAL LOW (ref >=60)
Glucose, Bld: 158 mg/dL — ABNORMAL HIGH (ref 65–99)
POTASSIUM: 4.2 mmol/L (ref 3.5–5.3)
SODIUM: 138 mmol/L (ref 135–146)

## 2017-03-14 LAB — CBC WITH DIFFERENTIAL/PLATELET
BASOS ABS: 0 {cells}/uL (ref 0–200)
Basophils Relative: 0 %
EOS PCT: 3 %
Eosinophils Absolute: 138 cells/uL (ref 15–500)
HEMATOCRIT: 45.1 % (ref 38.5–50.0)
HEMOGLOBIN: 15 g/dL (ref 13.2–17.1)
Lymphocytes Relative: 27 %
Lymphs Abs: 1242 cells/uL (ref 850–3900)
MCH: 29.9 pg (ref 27.0–33.0)
MCHC: 33.3 g/dL (ref 32.0–36.0)
MCV: 89.8 fL (ref 80.0–100.0)
MONO ABS: 322 {cells}/uL (ref 200–950)
MPV: 10.7 fL (ref 7.5–12.5)
Monocytes Relative: 7 %
NEUTROS ABS: 2898 {cells}/uL (ref 1500–7800)
NEUTROS PCT: 63 %
Platelets: 137 10*3/uL — ABNORMAL LOW (ref 140–400)
RBC: 5.02 MIL/uL (ref 4.20–5.80)
RDW: 14 % (ref 11.0–15.0)
WBC: 4.6 10*3/uL (ref 3.8–10.8)

## 2017-03-14 LAB — HEPATIC FUNCTION PANEL
ALK PHOS: 58 U/L (ref 40–115)
ALT: 15 U/L (ref 9–46)
AST: 13 U/L (ref 10–35)
Albumin: 4 g/dL (ref 3.6–5.1)
BILIRUBIN DIRECT: 0.2 mg/dL (ref <=0.2)
BILIRUBIN TOTAL: 0.9 mg/dL (ref 0.2–1.2)
Indirect Bilirubin: 0.7 mg/dL (ref 0.2–1.2)
Total Protein: 6.2 g/dL (ref 6.1–8.1)

## 2017-03-14 LAB — LIPID PANEL
CHOL/HDL RATIO: 3.5 ratio (ref <5.0)
Cholesterol: 159 mg/dL (ref <200)
HDL: 45 mg/dL (ref >40)
LDL CALC: 97 mg/dL (ref <100)
Triglycerides: 86 mg/dL (ref <150)
VLDL: 17 mg/dL (ref <30)

## 2017-03-14 LAB — TSH: TSH: 1.57 m[IU]/L (ref 0.40–4.50)

## 2017-03-15 ENCOUNTER — Encounter: Payer: Self-pay | Admitting: Internal Medicine

## 2017-03-15 LAB — HEMOGLOBIN A1C
HEMOGLOBIN A1C: 6.8 % — AB (ref <5.7)
Mean Plasma Glucose: 148 mg/dL

## 2017-03-15 LAB — MAGNESIUM: Magnesium: 2 mg/dL (ref 1.5–2.5)

## 2017-03-15 LAB — VITAMIN D 25 HYDROXY (VIT D DEFICIENCY, FRACTURES): Vit D, 25-Hydroxy: 80 ng/mL (ref 30–100)

## 2017-03-16 LAB — INSULIN, RANDOM: Insulin: 9.6 u[IU]/mL (ref 2.0–19.6)

## 2017-04-19 NOTE — Progress Notes (Signed)
MEDICARE ANNUAL WELLNESS VISIT AND FOLLOW UP Assessment:   Essential hypertension - continue medications, DASH diet, exercise and monitor at home. Call if greater than 130/80.  -     CBC with Differential/Platelet -     Hepatic function panel -     BASIC METABOLIC PANEL WITH GFR -     TSH  T2_NIDDM Discussed general issues about diabetes pathophysiology and management., Educational material distributed., Suggested low cholesterol diet., Encouraged aerobic exercise., Discussed foot care., Reminded to get yearly retinal exam. -     Hemoglobin A1c  Mixed hyperlipidemia -continue medications, check lipids, decrease fatty foods, increase activity.  -     Lipid panel  Medication management -     Magnesium  Gastroesophageal reflux disease, esophagitis presence not specified Continue PPI/H2 blocker, diet discussed -     ranitidine (ZANTAC) 150 MG tablet; Take 1 tablet (150 mg total) by mouth at bedtime.  Erectile dysfunction, unspecified erectile dysfunction type meds PRN  Benign prostatic hyperplasia with urinary frequency On medications  Vitamin D deficiency Continue supplement  Medicare annual wellness visit, initial Next year  Advanced care counseling/discussion Discussed advanced care planning with patient   Over 30 minutes of exam, counseling, chart review, and critical decision making was performed  Plan:   During the course of the visit the patient was educated and counseled about appropriate screening and preventive services including:    Pneumococcal vaccine   Influenza vaccine  Prevnar 13  Td vaccine  Screening electrocardiogram  Colorectal cancer screening  Diabetes screening  Glaucoma screening  Nutrition counseling    Subjective:  Edward [name] is a 76 y.o. male who presents for Medicare Annual Wellness Visit and 3 month follow up for HTN, hyperlipidemia, prediabetes, and vitamin D Def.   His blood pressure has been controlled at home,  today their BP is BP: 124/80 He does workout. He denies chest pain, shortness of breath, dizziness.   He is on cholesterol medication and denies myalgias. His cholesterol is at goal. The cholesterol last visit was:   Lab Results  Component Value Date   CHOL 159 03/14/2017   HDL 45 03/14/2017   LDLCALC 97 03/14/2017   TRIG 86 03/14/2017   CHOLHDL 3.5 03/14/2017   He has been working on diet and exercise for diabetes he does not check his sugars, and denies foot ulcerations, hyperglycemia, hypoglycemia , increased appetite, nausea, paresthesia of the feet, polydipsia, polyuria, visual disturbances, vomiting and weight loss. Last A1C in the office was:  Lab Results  Component Value Date   HGBA1C 6.8 (H) 03/14/2017   Last GFR Lab Results  Component Value Date   GFRNONAA 59 (L) 03/14/2017    Patient is on Vitamin D supplement.   Lab Results  Component Value Date   VD25OH 80 03/14/2017     BMI is Body mass index is 26.57 kg/m., he is working on diet and exercise. Wt Readings from Last 3 Encounters:  04/20/17 178 lb 9.6 oz (81 kg)  03/14/17 182 lb (82.6 kg)  11/16/16 186 lb 3.2 oz (84.5 kg)    Medication Review: Current Outpatient Prescriptions on File Prior to Visit  Medication Sig Dispense Refill  . aspirin 81 MG tablet Take 81 mg by mouth daily.    . Cholecalciferol (VITAMIN D PO) Take 5,000 Units by mouth daily.    . Omega-3 Fatty Acids (FISH OIL) 1000 MG CAPS Take 1 capsule by mouth daily.    . pantoprazole (PROTONIX) 40 MG tablet TAKE  ONE TABLET BY MOUTH ONCE DAILY FOR  ACID  INDIGESTION 90 tablet 3  . pravastatin (PRAVACHOL) 40 MG tablet Take 1 tablet (40 mg total) by mouth daily. 90 tablet 3  . sildenafil (REVATIO) 20 MG tablet TAKE ONE TO FIVE TABLETS BY MOUTH ONE HOUR BEFORE NEEDED 90 tablet 3  . tamsulosin (FLOMAX) 0.4 MG CAPS capsule Take 1 capsule (0.4 mg total) by mouth daily. 90 capsule 3   No current facility-administered medications on file prior to visit.      Current Problems (verified) Patient Active Problem List   Diagnosis Date Noted  . Medicare annual wellness visit, initial 04/09/2015  . T2_NIDDM 11/01/2014  . Obesity 07/25/2014  . Medication management 04/08/2014  . Hyperlipidemia   . GERD (gastroesophageal reflux disease)   . Hypertension   . Vitamin D deficiency   . ED (erectile dysfunction)   . BPH (benign prostatic hyperplasia)     Screening Tests Immunization History  Administered Date(s) Administered  . DT 04/08/2014  . Influenza, High Dose Seasonal PF 06/24/2014, 07/28/2015, 05/02/2016  . Influenza-Unspecified 07/02/2013  . Pneumococcal Conjugate-13 04/09/2015  . Pneumococcal-Unspecified 10/02/2007  . Td 10/02/2003  . Zoster 10/01/2006    Preventative care: Last colonoscopy: 2013  Prior vaccinations: TD or Tdap: 2015  Influenza:2017 Pneumococcal: 2009 Prevnar13: 2016 Shingles/Zostavax: 2008  Names of Other Physician/Practitioners you currently use: 1. [facility] here for primary care 2. [facility], eye doctor, last visit June 2018 3.Dr. Modena Nunnery, dentist, last visit  2018 q 6 months Patient Care Team: Unk Pinto, MD as PCP - General (Internal Medicine) Carolan Clines, MD as Consulting Physician (Urology) Luberta Mutter, MD as Consulting Physician (Ophthalmology) Inda Castle, MD as Consulting Physician (Gastroenterology)  Allergies Allergies  Allergen Reactions  . Bystolic [Nebivolol Hcl]     SURGICAL HISTORY He  has a past surgical history that includes Cataract extraction w/ intraocular lens implant (2013) and Kidney stone surgery. FAMILY HISTORY His family history includes Diabetes in his sister and sister; Heart disease in his mother; Hypertension in his sister and sister. SOCIAL HISTORY He  reports that he quit smoking about 49 years ago. He has a 5.00 pack-year smoking history. He has never used smokeless tobacco. He  reports that he does not drink alcohol or use drugs.  MEDICARE WELLNESS OBJECTIVES: Physical activity: Current Exercise Habits: The patient does not participate in regular exercise at present (yardwork) Cardiac risk factors: Cardiac Risk Factors include: advanced age (>45men, >60 women);diabetes mellitus;dyslipidemia;hypertension;male gender;sedentary lifestyle Depression/mood screen:   Depression screen [facility] 2/9 04/20/2017  Decreased Interest 0  Down, Depressed, Hopeless 0  PHQ - 2 Score 0    ADLs:  In your present state of health, do you have any difficulty performing the following activities: 04/20/2017 11/16/2016  Hearing? N Y  [place]? N -  Difficulty concentrating or making decisions? N -  Walking or climbing stairs? N -  Dressing or bathing? N -  Doing errands, shopping? N -  Some recent data might be hidden     Cognitive Testing  Alert? Yes  Normal Appearance?Yes  Oriented to person? Yes  Place? Yes   Time? Yes  Recall of three objects?  Yes  Can perform simple calculations? Yes  Displays appropriate judgment?Yes  Can read the correct time from a watch face?Yes  EOL planning: Does Patient Have a Medical Advance Directive?: Yes Type of Advance Directive: Healthcare Power of Attorney, Living will Copy of Healthcare Power of  Attorney in Chart?: No - copy requested   Objective:   Today's Vitals   04/20/17 1456  BP: 124/80  Pulse: 72  Resp: 14  SpO2: 93%  Weight: 178 lb 9.6 oz (81 kg)  Height: 5' 8.75" (1.746 m)  PainSc: 0-No pain   Body mass index is 26.57 kg/m.  General appearance: alert, no distress, WD/WN, male HEENT: normocephalic, sclerae anicteric, TMs pearly, nares patent, no discharge or erythema, pharynx normal Oral cavity: MMM, no lesions Neck: supple, no lymphadenopathy, no thyromegaly, no masses Heart: RRR, normal S1, S2, no murmurs Lungs: CTA bilaterally, no wheezes, rhonchi, or rales Abdomen: +bs, soft, non tender, non distended, no  masses, no hepatomegaly, no splenomegaly Musculoskeletal: nontender, no swelling, no obvious deformity Extremities: no edema, no cyanosis, no clubbing Pulses: 2+ symmetric, upper and lower extremities, normal cap refill Neurological: alert, oriented x 3, CN2-12 intact, strength normal upper extremities and lower extremities, sensation normal throughout, DTRs 2+ throughout, no cerebellar signs, gait normal Psychiatric: normal affect, behavior normal, pleasant   Medicare Attestation I have personally reviewed: The patient's medical and social history Their use of alcohol, tobacco or illicit drugs Their current medications and supplements The patient's functional ability including ADLs,fall risks, home safety risks, cognitive, and hearing and visual impairment Diet and physical activities Evidence for depression or mood disorders  The patient's weight, height, BMI, and visual acuity have been recorded in the chart.  I have made referrals, counseling, and provided education to the patient based on review of the above and I have provided the patient with a written personalized care plan for preventive services.     Vicie Mutters, PA-C   04/20/2017

## 2017-04-20 ENCOUNTER — Encounter: Payer: Self-pay | Admitting: Physician Assistant

## 2017-04-20 ENCOUNTER — Ambulatory Visit (INDEPENDENT_AMBULATORY_CARE_PROVIDER_SITE_OTHER): Payer: PPO | Admitting: Physician Assistant

## 2017-04-20 VITALS — BP 124/80 | HR 72 | Resp 14 | Ht 68.75 in | Wt 178.6 lb

## 2017-04-20 DIAGNOSIS — Z0001 Encounter for general adult medical examination with abnormal findings: Secondary | ICD-10-CM

## 2017-04-20 DIAGNOSIS — K219 Gastro-esophageal reflux disease without esophagitis: Secondary | ICD-10-CM

## 2017-04-20 DIAGNOSIS — E559 Vitamin D deficiency, unspecified: Secondary | ICD-10-CM | POA: Diagnosis not present

## 2017-04-20 DIAGNOSIS — E782 Mixed hyperlipidemia: Secondary | ICD-10-CM

## 2017-04-20 DIAGNOSIS — Z7189 Other specified counseling: Secondary | ICD-10-CM | POA: Diagnosis not present

## 2017-04-20 DIAGNOSIS — R6889 Other general symptoms and signs: Secondary | ICD-10-CM | POA: Diagnosis not present

## 2017-04-20 DIAGNOSIS — Z79899 Other long term (current) drug therapy: Secondary | ICD-10-CM

## 2017-04-20 DIAGNOSIS — N529 Male erectile dysfunction, unspecified: Secondary | ICD-10-CM | POA: Diagnosis not present

## 2017-04-20 DIAGNOSIS — N401 Enlarged prostate with lower urinary tract symptoms: Secondary | ICD-10-CM | POA: Diagnosis not present

## 2017-04-20 DIAGNOSIS — R350 Frequency of micturition: Secondary | ICD-10-CM | POA: Diagnosis not present

## 2017-04-20 DIAGNOSIS — E119 Type 2 diabetes mellitus without complications: Secondary | ICD-10-CM | POA: Diagnosis not present

## 2017-04-20 DIAGNOSIS — I10 Essential (primary) hypertension: Secondary | ICD-10-CM

## 2017-04-20 DIAGNOSIS — Z0000 Encounter for general adult medical examination without abnormal findings: Secondary | ICD-10-CM

## 2017-04-20 MED ORDER — RANITIDINE HCL 150 MG PO TABS: 150.0000 mg | ORAL_TABLET | Freq: Every day | ORAL | 1 refills | Status: DC

## 2017-04-20 NOTE — Patient Instructions (Addendum)
Get meter from [employer] or call insurance to find out which one they cover   Your A1C is a measure of your sugar over the past 3 months and is not affected by what you have eaten over the past few days. Diabetes increases your chances of stroke and heart attack over 300 % and is the leading cause of blindness and kidney failure in the [place]. Please make sure you decrease bad carbs like white bread, white rice, potatoes, corn, soft drinks, pasta, cereals, refined sugars, sweet tea, dried fruits, and fruit juice. Good carbs are okay to eat in moderation like sweet potatoes, brown rice, whole grain pasta/bread, most fruit (except dried fruit) and you can eat as many veggies as you want.   Greater than 6.5 is considered diabetic. Between 6.4 and 5.7 is prediabetic If your A1C is less than 5.7 you are NOT diabetic.  Targets for Glucose Readings: Time of Check Target for patients WITHOUT Diabetes Target for DIABETICS  Before Meals Less than 100  less than 150  Two hours after meals Less than 200  Less than 250   Take the protonix daily if you have heart burn again take the zantac 150 or 300 mg a day     Bad carbs also include fruit juice, alcohol, and sweet tea. These are empty calories that do not signal to your brain that you are full.   Please remember the good carbs are still carbs which convert into sugar. So please measure them out no more than 1/2-1 cup of rice, oatmeal, pasta, and beans  Veggies are however free foods! Pile them on.   Not all fruit is created equal. Please see the list below, the fruit at the bottom is higher in sugars than the fruit at the top. Please avoid all dried fruits.

## 2017-04-21 LAB — LIPID PANEL
Cholesterol: 176 mg/dL (ref <200)
HDL: 55 mg/dL (ref >40)
LDL CALC: 101 mg/dL — AB (ref <100)
Total CHOL/HDL Ratio: 3.2 Ratio (ref <5.0)
Triglycerides: 102 mg/dL (ref <150)
VLDL: 20 mg/dL (ref <30)

## 2017-04-21 LAB — CBC WITH DIFFERENTIAL/PLATELET
BASOS PCT: 0 %
Basophils Absolute: 0 cells/uL (ref 0–200)
EOS ABS: 122 {cells}/uL (ref 15–500)
Eosinophils Relative: 2 %
HEMATOCRIT: 48.7 % (ref 38.5–50.0)
Hemoglobin: 16.1 g/dL (ref 13.2–17.1)
Lymphocytes Relative: 25 %
Lymphs Abs: 1525 cells/uL (ref 850–3900)
MCH: 30 pg (ref 27.0–33.0)
MCHC: 33.1 g/dL (ref 32.0–36.0)
MCV: 90.7 fL (ref 80.0–100.0)
MONO ABS: 549 {cells}/uL (ref 200–950)
MONOS PCT: 9 %
MPV: 11.1 fL (ref 7.5–12.5)
NEUTROS ABS: 3904 {cells}/uL (ref 1500–7800)
Neutrophils Relative %: 64 %
Platelets: 174 10*3/uL (ref 140–400)
RBC: 5.37 MIL/uL (ref 4.20–5.80)
RDW: 14 % (ref 11.0–15.0)
WBC: 6.1 10*3/uL (ref 3.8–10.8)

## 2017-04-21 LAB — BASIC METABOLIC PANEL WITH GFR
BUN: 19 mg/dL (ref 7–25)
CALCIUM: 9.9 mg/dL (ref 8.6–10.3)
CO2: 24 mmol/L (ref 20–32)
Chloride: 104 mmol/L (ref 98–110)
Creat: 1.31 mg/dL — ABNORMAL HIGH (ref 0.70–1.18)
GFR, EST AFRICAN AMERICAN: 61 mL/min (ref >=60)
GFR, Est Non African American: 53 mL/min — ABNORMAL LOW (ref >=60)
GLUCOSE: 129 mg/dL — AB (ref 65–99)
Potassium: 4 mmol/L (ref 3.5–5.3)
Sodium: 141 mmol/L (ref 135–146)

## 2017-04-21 LAB — HEPATIC FUNCTION PANEL
ALT: 21 U/L (ref 9–46)
AST: 14 U/L (ref 10–35)
Albumin: 4.5 g/dL (ref 3.6–5.1)
Alkaline Phosphatase: 69 U/L (ref 40–115)
BILIRUBIN DIRECT: 0.2 mg/dL (ref <=0.2)
BILIRUBIN TOTAL: 0.9 mg/dL (ref 0.2–1.2)
Indirect Bilirubin: 0.7 mg/dL (ref 0.2–1.2)
Total Protein: 6.7 g/dL (ref 6.1–8.1)

## 2017-04-21 LAB — MAGNESIUM: MAGNESIUM: 2 mg/dL (ref 1.5–2.5)

## 2017-04-21 LAB — HEMOGLOBIN A1C
HEMOGLOBIN A1C: 6.8 % — AB (ref <5.7)
MEAN PLASMA GLUCOSE: 148 mg/dL

## 2017-04-21 LAB — TSH: TSH: 1.27 m[IU]/L (ref 0.40–4.50)

## 2017-05-18 ENCOUNTER — Encounter: Payer: Self-pay | Admitting: Internal Medicine

## 2017-05-29 ENCOUNTER — Encounter: Payer: Self-pay | Admitting: Internal Medicine

## 2017-06-28 ENCOUNTER — Encounter: Payer: Self-pay | Admitting: Internal Medicine

## 2017-07-31 ENCOUNTER — Ambulatory Visit: Payer: PPO | Admitting: Internal Medicine

## 2017-07-31 ENCOUNTER — Encounter: Payer: Self-pay | Admitting: Internal Medicine

## 2017-07-31 VITALS — BP 128/74 | HR 56 | Temp 97.5°F | Resp 18 | Ht 67.75 in | Wt 184.0 lb

## 2017-07-31 DIAGNOSIS — Z136 Encounter for screening for cardiovascular disorders: Secondary | ICD-10-CM

## 2017-07-31 DIAGNOSIS — I10 Essential (primary) hypertension: Secondary | ICD-10-CM | POA: Diagnosis not present

## 2017-07-31 DIAGNOSIS — E782 Mixed hyperlipidemia: Secondary | ICD-10-CM

## 2017-07-31 DIAGNOSIS — Z23 Encounter for immunization: Secondary | ICD-10-CM

## 2017-07-31 DIAGNOSIS — K219 Gastro-esophageal reflux disease without esophagitis: Secondary | ICD-10-CM

## 2017-07-31 DIAGNOSIS — Z0001 Encounter for general adult medical examination with abnormal findings: Secondary | ICD-10-CM

## 2017-07-31 DIAGNOSIS — Z79899 Other long term (current) drug therapy: Secondary | ICD-10-CM

## 2017-07-31 DIAGNOSIS — Z0000 Encounter for general adult medical examination without abnormal findings: Secondary | ICD-10-CM | POA: Diagnosis not present

## 2017-07-31 DIAGNOSIS — E559 Vitamin D deficiency, unspecified: Secondary | ICD-10-CM

## 2017-07-31 DIAGNOSIS — E119 Type 2 diabetes mellitus without complications: Secondary | ICD-10-CM | POA: Diagnosis not present

## 2017-07-31 DIAGNOSIS — N401 Enlarged prostate with lower urinary tract symptoms: Secondary | ICD-10-CM

## 2017-07-31 DIAGNOSIS — Z1211 Encounter for screening for malignant neoplasm of colon: Secondary | ICD-10-CM

## 2017-07-31 DIAGNOSIS — Z1212 Encounter for screening for malignant neoplasm of rectum: Secondary | ICD-10-CM

## 2017-07-31 DIAGNOSIS — Z125 Encounter for screening for malignant neoplasm of prostate: Secondary | ICD-10-CM

## 2017-07-31 DIAGNOSIS — F17200 Nicotine dependence, unspecified, uncomplicated: Secondary | ICD-10-CM

## 2017-07-31 DIAGNOSIS — R350 Frequency of micturition: Secondary | ICD-10-CM

## 2017-07-31 NOTE — Patient Instructions (Signed)

## 2017-07-31 NOTE — Progress Notes (Signed)
[facility]   Unk Pinto, M.D.     Uvaldo Bristle. Silverio Lay, P.A.-C Liane Comber, [place] Serenidy Waltz                [address] [place] [address] Telephone [phone] Telefax [phone] Annual  Screening/Preventative Visit  & Comprehensive Evaluation & Examination     This very nice 76 y.o. MWM presents for a Screening/Preventative Visit & comprehensive evaluation and management of multiple medical co-morbidities.  Patient has been followed for HTN, T2_NIDDM  , Hyperlipidemia and Vitamin D Deficiency. Patient also ha s GERD controlled on his meds.     HTN predates since 2010. Patient's BP has been controlled at home.  Today's BP is at goal - 128/74. Patient denies any cardiac symptoms as chest pain, palpitations, shortness of breath, dizziness or ankle swelling.     Patient's hyperlipidemia is controlled with diet and medications. Patient denies myalgias or other medication SE's. Last lipids were near goal: Lab Results  Component Value Date   CHOL 176 04/20/2017   HDL 55 04/20/2017   LDLCALC 101 (H) 04/20/2017   TRIG 102 04/20/2017   CHOLHDL 3.2 04/20/2017      Patient has Prediabetes (A1c 6.1%) since 2011 and then T2_NIDDM since 2015 with A1c 6.5% and 6.7% in 2017 and patient is attempting to control with diet.  Patient denies reactive hypoglycemic symptoms, visual blurring, diabetic polys or paresthesias. Last A1c was still not at goal: Lab Results  Component Value Date   HGBA1C 6.8 (H) 04/20/2017       Finally, patient has history of Vitamin D Deficiency ("29" / 2008) and last vitamin D was at goal: Lab Results  Component Value Date   VD25OH 71 03/14/2017   Current Outpatient Medications on File Prior to Visit  Medication Sig  . aspirin 81 MG tablet Take 81 mg by mouth daily.  . Cholecalciferol (VITAMIN D PO) Take 5,000 Units by mouth daily.  . Omega-3 Fatty Acids (FISH OIL) 1000 MG  CAPS Take 1 capsule by mouth daily.  . pantoprazole (PROTONIX) 40 MG tablet TAKE ONE TABLET BY MOUTH ONCE DAILY FOR  ACID  INDIGESTION  . pravastatin (PRAVACHOL) 40 MG tablet Take 1 tablet (40 mg total) by mouth daily.  . ranitidine (ZANTAC) 150 MG tablet Take 1 tablet (150 mg total) by mouth at bedtime.  . sildenafil (REVATIO) 20 MG tablet TAKE ONE TO FIVE TABLETS BY MOUTH ONE HOUR BEFORE NEEDED  . tamsulosin (FLOMAX) 0.4 MG CAPS capsule Take 1 capsule (0.4 mg total) by mouth daily.   No current facility-administered medications on file prior to visit.    Allergies  Allergen Reactions  . Bystolic [Nebivolol Hcl]    Past Medical History:  Diagnosis Date  . BPH (benign prostatic hyperplasia)   . ED (erectile dysfunction)   . GERD (gastroesophageal reflux disease)   . Hyperlipidemia   . Hypertension   . IBS (irritable bowel syndrome)   . LBBB (left bundle branch block)   . Personal history of kidney stones   . Prediabetes   . Vitamin D deficiency    Health Maintenance  Topic Date Due  . INFLUENZA VACCINE  04/12/2017  . URINE MICROALBUMIN  05/02/2017  . HEMOGLOBIN A1C  10/21/2017  . OPHTHALMOLOGY EXAM  02/22/2018  . FOOT EXAM  04/20/2018  . COLONOSCOPY  03/23/2022  . TETANUS/TDAP  04/08/2024  . PNA vac Low Risk Adult  Completed   Immunization History  Administered Date(s) Administered  . DT 04/08/2014  . Influenza, High Dose Seasonal PF 06/24/2014, 07/28/2015, 05/02/2016, 07/31/2017  . Influenza-Unspecified 07/02/2013  . Pneumococcal Conjugate-13 04/09/2015  . Pneumococcal-Unspecified 10/02/2007  . Td 10/02/2003  . Zoster 10/01/2006   Past Surgical History:  Procedure Laterality Date  . CATARACT EXTRACTION W/ INTRAOCULAR LENS IMPLANT  2013   left  . KIDNEY STONE SURGERY     Family History  Problem Relation Age of Onset  . Colon cancer Neg Hx   . Diabetes Sister   . Hypertension Sister   . Heart disease Mother   . Diabetes Sister   . Hypertension Sister     Social History   Socioeconomic History  . Marital status: Married    Spouse name: Arbie Cookey  Social Needs  Occupational History  . Retired - has a Data processing manager  . Smoking status: Former Smoker    Packs/day: 0.50    Years: 10.00    Pack years: 5.00    Last attempt to quit: 09/13/1967    Years since quitting: 49.9  . Smokeless tobacco: Never Used  Substance and Sexual Activity  . Alcohol use: No  . Drug use: No  . Sexual activity: Not on file    ROS Constitutional: Denies fever, chills, weight loss/gain, headaches, insomnia,  night sweats or change in appetite. Does c/o fatigue. Eyes: Denies redness, blurred vision, diplopia, discharge, itchy or watery eyes.  ENT: Denies discharge, congestion, post nasal drip, epistaxis, sore throat, earache, hearing loss, dental pain, Tinnitus, Vertigo, Sinus pain or snoring.  Cardio: Denies chest pain, palpitations, irregular heartbeat, syncope, dyspnea, diaphoresis, orthopnea, PND, claudication or edema Respiratory: denies cough, dyspnea, DOE, pleurisy, hoarseness, laryngitis or wheezing.  Gastrointestinal: Denies dysphagia, heartburn, reflux, water brash, pain, cramps, nausea, vomiting, bloating, diarrhea, constipation, hematemesis, melena, hematochezia, jaundice or hemorrhoids Genitourinary: Denies dysuria,  urgency,  hesitancy, discharge, hematuria or flank pain. Reports frequency & nocturia x 2-3.  Musculoskeletal: Denies arthralgia, myalgia, stiffness, Jt. Swelling, pain, limp or strain/sprain. Denies Falls. Skin: Denies puritis, rash, hives, warts, acne, eczema or change in skin lesion Neuro: No weakness, tremor, incoordination, spasms, paresthesia or pain Psychiatric: Denies confusion, memory loss or sensory loss. Denies Depression. Endocrine: Denies change in weight, skin, hair change, nocturia, and paresthesia, diabetic polys, visual blurring or hyper / hypo glycemic episodes.  Heme/Lymph: No excessive bleeding, bruising  or enlarged lymph nodes.  Physical Exam  BP 128/74   Pulse (!) 56   Temp (!) 97.5 F (36.4 C)   Resp 18   Ht 5' 7.75" (1.721 m)   Wt 184 lb (83.5 kg)   BMI 28.18 kg/m   General Appearance: Well nourished and well groomed and in no apparent distress.  Eyes: PERRLA, EOMs, conjunctiva no swelling or erythema, normal fundi and vessels. Sinuses: No frontal/maxillary tenderness ENT/Mouth: EACs patent / TMs  nl. Nares clear without erythema, swelling, mucoid exudates. Oral hygiene is good. No erythema, swelling, or exudate. Tongue normal, non-obstructing. Tonsils not swollen or erythematous. Hearing normal.  Neck: Supple, thyroid normal. No bruits, nodes or JVD. Respiratory: Respiratory effort normal.  BS equal and clear bilateral without rales, rhonci, wheezing or stridor. Cardio: Heart sounds are normal with regular rate and rhythm and no murmurs, rubs or gallops. Peripheral pulses are normal and equal bilaterally without edema. No aortic or femoral bruits. Chest: symmetric with normal excursions and percussion.  Abdomen: Soft, with Nl  bowel sounds. Nontender, no guarding, rebound, hernias, masses, or organomegaly.  Lymphatics: Non tender without lymphadenopathy.  Genitourinary: No hernias.Testes nl. DRE - prostate nl for age - smooth & firm w/o nodules. Musculoskeletal: Full ROM all peripheral extremities, joint stability, 5/5 strength, and normal gait. Skin: Warm and dry without rashes, lesions, cyanosis, clubbing or  ecchymosis.  Neuro: Cranial nerves intact, reflexes equal bilaterally. Normal muscle tone, no cerebellar symptoms. Sensation intact to touch, vibratory and Monofilament to the toes bilaterally. Pysch: Alert and oriented X 3 with normal affect, insight and judgment appropriate.   Assessment and Plan  1. Annual Preventative/Screening Exam   2. Essential hypertension  - EKG 12-Lead - [place], RETROPERITNL ABD,  LTD - Urinalysis, Routine w reflex microscopic - Microalbumin /  creatinine urine ratio - CBC with Differential/Platelet - BASIC METABOLIC PANEL WITH GFR - Magnesium - TSH  3. Hyperlipidemia, mixed  - EKG 12-Lead - [place], RETROPERITNL ABD,  LTD - Hepatic function panel - Lipid panel - TSH  4. T2_NIDDM  - EKG 12-Lead - [place], RETROPERITNL ABD,  LTD - Urinalysis, Routine w reflex microscopic - Microalbumin / creatinine urine ratio - HM DIABETES FOOT EXAM - LOW EXTREMITY NEUR EXAM DOCUM - Hemoglobin A1c - Insulin, random  5. Vitamin D deficiency  - VITAMIN D 25 Hydroxy  6. Benign prostatic hyperplasia with urinary frequency  - PSA  7. Gastroesophageal reflux disease  - CBC with Differential/Platelet  8. Screening for colorectal cancer  - POC Hemoccult Bld/Stl   9. Prostate cancer screening  - PSA  10. Screening for ischemic heart disease  - EKG 12-Lead  11. Smoker  - [place], RETROPERITNL ABD,  LTD  12. Screening for AAA (aortic abdominal aneurysm)  - [place], RETROPERITNL ABD,  LTD  13. Medication management  - Urinalysis, Routine w reflex microscopic - Microalbumin / creatinine urine ratio - CBC with Differential/Platelet - BASIC METABOLIC PANEL WITH GFR - Hepatic function panel - Magnesium - Lipid panel - TSH - Hemoglobin A1c - Insulin, random - VITAMIN D 25 Hydroxy 14. Need for immunization against influenza  - Flu vaccine HIGH DOSE PF (Fluzone High dose)        Patient was counseled in prudent diet, weight control to achieve/maintain BMI less than 25, BP monitoring, regular exercise and medications as discussed.  Discussed med effects and SE's. Routine screening labs and tests as requested with regular follow-up as recommended. Over 40 minutes of exam, counseling, chart review and high complex critical decision making was performed

## 2017-08-01 LAB — BASIC METABOLIC PANEL WITH GFR
BUN/Creatinine Ratio: 11 (calc) (ref 6–22)
BUN: 14 mg/dL (ref 7–25)
CALCIUM: 9.3 mg/dL (ref 8.6–10.3)
CHLORIDE: 104 mmol/L (ref 98–110)
CO2: 29 mmol/L (ref 20–32)
Creat: 1.22 mg/dL — ABNORMAL HIGH (ref 0.70–1.18)
GFR, EST NON AFRICAN AMERICAN: 58 mL/min/{1.73_m2} — AB (ref >=60)
GFR, Est African American: 67 mL/min/{1.73_m2} (ref >=60)
Glucose, Bld: 144 mg/dL — ABNORMAL HIGH (ref 65–99)
POTASSIUM: 4.3 mmol/L (ref 3.5–5.3)
SODIUM: 142 mmol/L (ref 135–146)

## 2017-08-01 LAB — MICROALBUMIN / CREATININE URINE RATIO
CREATININE, URINE: 64 mg/dL (ref 20–320)
Microalb Creat Ratio: 8 mcg/mg creat (ref <30)
Microalb, Ur: 0.5 mg/dL

## 2017-08-01 LAB — HEPATIC FUNCTION PANEL
AG Ratio: 2 (calc) (ref 1.0–2.5)
ALBUMIN MSPROF: 4.6 g/dL (ref 3.6–5.1)
ALKALINE PHOSPHATASE (APISO): 67 U/L (ref 40–115)
ALT: 21 U/L (ref 9–46)
AST: 14 U/L (ref 10–35)
BILIRUBIN DIRECT: 0.1 mg/dL (ref 0.0–0.2)
BILIRUBIN TOTAL: 0.7 mg/dL (ref 0.2–1.2)
Globulin: 2.3 g/dL (calc) (ref 1.9–3.7)
Indirect Bilirubin: 0.6 mg/dL (calc) (ref 0.2–1.2)
Total Protein: 6.9 g/dL (ref 6.1–8.1)

## 2017-08-01 LAB — LIPID PANEL
Cholesterol: 182 mg/dL (ref <200)
HDL: 61 mg/dL (ref >40)
LDL Cholesterol (Calc): 106 mg/dL (calc) — ABNORMAL HIGH
NON-HDL CHOLESTEROL (CALC): 121 mg/dL (ref <130)
TRIGLYCERIDES: 65 mg/dL (ref <150)
Total CHOL/HDL Ratio: 3 (calc) (ref <5.0)

## 2017-08-01 LAB — HEMOGLOBIN A1C
Hgb A1c MFr Bld: 6.9 %{Hb} — ABNORMAL HIGH
Mean Plasma Glucose: 151 (calc)
eAG (mmol/L): 8.4 (calc)

## 2017-08-01 LAB — VITAMIN D 25 HYDROXY (VIT D DEFICIENCY, FRACTURES): Vit D, 25-Hydroxy: 63 ng/mL (ref 30–100)

## 2017-08-01 LAB — CBC WITH DIFFERENTIAL/PLATELET
Basophils Absolute: 38 {cells}/uL (ref 0–200)
Basophils Relative: 0.8 %
Eosinophils Absolute: 61 {cells}/uL (ref 15–500)
Eosinophils Relative: 1.3 %
HCT: 48 % (ref 38.5–50.0)
Hemoglobin: 16.3 g/dL (ref 13.2–17.1)
Lymphs Abs: 1354 {cells}/uL (ref 850–3900)
MCH: 30.2 pg (ref 27.0–33.0)
MCHC: 34 g/dL (ref 32.0–36.0)
MCV: 89.1 fL (ref 80.0–100.0)
MPV: 11.7 fL (ref 7.5–12.5)
Monocytes Relative: 7.6 %
Neutro Abs: 2891 {cells}/uL (ref 1500–7800)
Neutrophils Relative %: 61.5 %
Platelets: 144 Thousand/uL (ref 140–400)
RBC: 5.39 Million/uL (ref 4.20–5.80)
RDW: 12.7 % (ref 11.0–15.0)
Total Lymphocyte: 28.8 %
WBC mixed population: 357 {cells}/uL (ref 200–950)
WBC: 4.7 Thousand/uL (ref 3.8–10.8)

## 2017-08-01 LAB — PSA: PSA: 2.3 ng/mL (ref <=4.0)

## 2017-08-01 LAB — URINALYSIS, ROUTINE W REFLEX MICROSCOPIC
BILIRUBIN URINE: NEGATIVE
Glucose, UA: NEGATIVE
Hgb urine dipstick: NEGATIVE
KETONES UR: NEGATIVE
Leukocytes, UA: NEGATIVE
Nitrite: NEGATIVE
PROTEIN: NEGATIVE
Specific Gravity, Urine: 1.011 (ref 1.001–1.03)
pH: 6 (ref 5.0–8.0)

## 2017-08-01 LAB — INSULIN, RANDOM: Insulin: 7.9 u[IU]/mL (ref 2.0–19.6)

## 2017-08-01 LAB — TSH: TSH: 1.71 m[IU]/L (ref 0.40–4.50)

## 2017-08-01 LAB — MAGNESIUM: Magnesium: 2 mg/dL (ref 1.5–2.5)

## 2017-09-06 ENCOUNTER — Other Ambulatory Visit: Payer: Self-pay | Admitting: *Deleted

## 2017-09-06 MED ORDER — PRAVASTATIN SODIUM 40 MG PO TABS: 40.0000 mg | ORAL_TABLET | Freq: Every day | ORAL | 3 refills | Status: DC

## 2017-09-06 MED ORDER — TAMSULOSIN HCL 0.4 MG PO CAPS: 0.4000 mg | ORAL_CAPSULE | Freq: Every day | ORAL | 3 refills | Status: DC

## 2017-10-31 ENCOUNTER — Other Ambulatory Visit: Payer: Self-pay | Admitting: Physician Assistant

## 2017-10-31 DIAGNOSIS — K219 Gastro-esophageal reflux disease without esophagitis: Secondary | ICD-10-CM

## 2017-11-01 ENCOUNTER — Other Ambulatory Visit: Payer: Self-pay

## 2017-11-01 MED ORDER — PANTOPRAZOLE SODIUM 40 MG PO TBEC: DELAYED_RELEASE_TABLET | ORAL | 3 refills | Status: DC

## 2017-11-02 DIAGNOSIS — Z860101 Personal history of adenomatous and serrated colon polyps: Secondary | ICD-10-CM | POA: Insufficient documentation

## 2017-11-02 DIAGNOSIS — Z86010 Personal history of colonic polyps: Secondary | ICD-10-CM | POA: Insufficient documentation

## 2017-11-02 DIAGNOSIS — E1122 Type 2 diabetes mellitus with diabetic chronic kidney disease: Secondary | ICD-10-CM | POA: Insufficient documentation

## 2017-11-02 DIAGNOSIS — N183 Chronic kidney disease, stage 3 (moderate): Secondary | ICD-10-CM

## 2017-11-02 HISTORY — DX: Personal history of colonic polyps: Z86.010

## 2017-11-02 HISTORY — DX: Personal history of adenomatous and serrated colon polyps: Z86.0101

## 2017-11-02 NOTE — Progress Notes (Signed)
MEDICARE ANNUAL WELLNESS VISIT AND FOLLOW UP  Assessment:   Diagnoses and all orders for this visit:  Encounter for Medicare annual wellness exam  Essential hypertension At goal; continue medication Monitor blood pressure at home; call if consistently over 130/80 Continue DASH diet.   Reminder to go to the ER if any CP, SOB, nausea, dizziness, severe HA, changes vision/speech, left arm numbness and tingling and jaw pain.  Gastroesophageal reflux disease, esophagitis presence not specified Well managed on current medications Discussed diet, avoiding triggers and other lifestyle changes  Type 2 diabetes mellitus with stage 3 chronic kidney disease, without long-term current use of insulin ([place]) Discussed general issues about diabetes pathophysiology and management., Educational material distributed., Suggested low cholesterol diet., Encouraged aerobic exercise., Discussed foot care., Reminded to get yearly retinal exam, report from last year requested Discussed metformin; will initiate if A1C remains similar to previous  -     Hemoglobin A1c  Benign prostatic hyperplasia with urinary frequency Stable on daily medications; check PSA annually- last 3 trending down  Erectile dysfunction, unspecified erectile dysfunction type Continue PRN medicatoin  Mixed hyperlipidemia Currently treated by pravastatin; discussed LDL goal <70 with diabetes Continue low cholesterol diet and exercise.  -     Lipid panel -     TSH  Medication management -     CBC with Differential/Platelet -     BASIC METABOLIC PANEL WITH GFR -     Hepatic function panel  Overweight (BMI 25.0-29.9) Long discussion about weight loss, diet, and exercise Recommended diet heavy in fruits and veggies and low in animal meats, cheeses, and dairy products, appropriate calorie intake Discussed appropriate weight for height  Follow up at next visit  Vitamin D deficiency Near goal at recent check; continue to recommend  supplementation for goal of 70-100 Defer vitamin D level  History of adenomatous polyp of colon Due for 5 year follow up on colonoscopy; will refer back  CKD stage 3 due to type 2 diabetes mellitus (HCC) Increase fluids, avoid NSAIDS, monitor sugars, will monitor -     BASIC METABOLIC PANEL WITH GFR  Bilateral cerumen impaction - stop using Qtips, irrigation used in the office without complications, hearing and pain improved, use OTC drops/oil at home to prevent reoccurence  Over 40 minutes of exam, counseling, chart review and critical decision making was performed Future Appointments  Date Time Provider [place]  02/15/2018 10:30 AM Unk Pinto, MD [facility] None  09/03/2018  9:00 AM Unk Pinto, MD [facility] None     Plan:   During the course of the visit the patient was educated and counseled about appropriate screening and preventive services including:    Pneumococcal vaccine   Prevnar 13  Influenza vaccine  Td vaccine  Screening electrocardiogram  Bone densitometry screening  Colorectal cancer screening  Diabetes screening  Glaucoma screening  Nutrition counseling   Advanced directives: requested   Subjective:  Edward [name] is a 77 y.o. male who presents for Medicare Annual Wellness Visit and 3 month follow up.   BMI is Body mass index is 27.97 kg/m., he has been working on diet and exercise. Wt Readings from Last 3 Encounters:  11/03/17 182 lb 9.6 oz (82.8 kg)  07/31/17 184 lb (83.5 kg)  04/20/17 178 lb 9.6 oz (81 kg)    His blood pressure has been controlled at home, today their BP is BP: 124/68 He does workout. He denies chest pain, shortness of breath, dizziness.   He is on cholesterol  medication (pravastatin 40 mg daily) and denies myalgias. His cholesterol is not at goal. The cholesterol last visit was:   Lab Results  Component Value Date   CHOL 182 07/31/2017   HDL 61 07/31/2017   LDLCALC 101 (H) 04/20/2017    TRIG 65 07/31/2017   CHOLHDL 3.0 07/31/2017    He has been working on diet and exercise for T2 diabetes, and denies increased appetite, nausea, paresthesia of the feet, polydipsia, polyuria, visual disturbances, vomiting and weight loss. Last A1C in the office was:  Lab Results  Component Value Date   HGBA1C 6.9 (H) 07/31/2017   Last GFR:  Lab Results  Component Value Date   GFRNONAA 58 (L) 07/31/2017   Patient is on Vitamin D supplement and near goal at last check:    Lab Results  Component Value Date   VD25OH 63 07/31/2017      Medication Review: Current Outpatient Medications on File Prior to Visit  Medication Sig Dispense Refill  . APPLE CIDER VINEGAR PO Take by mouth.    Marland Kitchen aspirin 81 MG tablet Take 81 mg by mouth daily.    . Cholecalciferol (VITAMIN D PO) Take 5,000 Units by mouth daily.    Marland Kitchen CINNAMON PO Take by mouth. Take 1-2 tablets daily    . Omega-3 Fatty Acids (FISH OIL) 1000 MG CAPS Take 1 capsule by mouth daily.    . pantoprazole (PROTONIX) 40 MG tablet TAKE ONE TABLET BY MOUTH ONCE DAILY FOR  ACID  INDIGESTION 90 tablet 3  . pravastatin (PRAVACHOL) 40 MG tablet Take 1 tablet (40 mg total) by mouth daily. 90 tablet 3  . ranitidine (ZANTAC) 150 MG tablet TAKE 1 TABLET BY MOUTH AT BEDTIME 90 tablet 1  . sildenafil (REVATIO) 20 MG tablet TAKE ONE TO FIVE TABLETS BY MOUTH ONE HOUR BEFORE NEEDED 90 tablet 3  . tamsulosin (FLOMAX) 0.4 MG CAPS capsule Take 1 capsule (0.4 mg total) by mouth daily. 90 capsule 3   No current facility-administered medications on file prior to visit.     Allergies  Allergen Reactions  . Bystolic [Nebivolol Hcl]     Current Problems (verified) Patient Active Problem List   Diagnosis Date Noted  . History of adenomatous polyp of colon 11/02/2017  . CKD stage 3 due to type 2 diabetes mellitus ([place]) 11/02/2017  . Encounter for Medicare annual wellness exam 04/09/2015  . Type 2 diabetes mellitus ([place]) 11/01/2014  . Overweight (BMI  25.0-29.9) 07/25/2014  . Medication management 04/08/2014  . Hyperlipidemia   . GERD (gastroesophageal reflux disease)   . Hypertension   . Vitamin D deficiency   . ED (erectile dysfunction)   . BPH (benign prostatic hyperplasia)     Screening Tests Immunization History  Administered Date(s) Administered  . DT 04/08/2014  . Influenza, High Dose Seasonal PF 06/24/2014, 07/28/2015, 05/02/2016, 07/31/2017  . Influenza-Unspecified 07/02/2013  . Pneumococcal Conjugate-13 04/09/2015  . Pneumococcal-Unspecified 10/02/2007  . Td 10/02/2003  . Zoster 10/01/2006   Preventative care: Last colonoscopy: 2013, 5 year f/u - DUE  Prior vaccinations: TD or Tdap: 2015  Influenza:2018 Pneumococcal: 2009 Prevnar13: 2016 Shingles/Zostavax: 2008  Names of Other Physician/Practitioners you currently use: 1. [facility] here for primary care 2. [facility], eye doctor, last visit June 2018 NEED REPORT - requested 3.Dr. Modena Nunnery, dentist, last visit  2018 q 6 months  Patient Care Team: Unk Pinto, MD as PCP - General (Internal Medicine) Carolan Clines, MD as Consulting Physician (Urology) [place],  Altha Harm, MD as Consulting Physician (Ophthalmology) Inda Castle, MD (Inactive) as Consulting Physician (Gastroenterology)  SURGICAL HISTORY He  has a past surgical history that includes Cataract extraction w/ intraocular lens implant (2013) and Kidney stone surgery. FAMILY HISTORY His family history includes Diabetes in his sister and sister; Heart disease in his mother; Hypertension in his sister and sister. SOCIAL HISTORY He  reports that he quit smoking about 50 years ago. He has a 5.00 pack-year smoking history. he has never used smokeless tobacco. He reports that he does not drink alcohol or use drugs.   MEDICARE WELLNESS OBJECTIVES: Physical activity: Current Exercise Habits: [place]), Type of  exercise: strength training/weights;Other - see comments(Plays ball), Time (Minutes): 45, Frequency (Times/Week): 6, Weekly Exercise (Minutes/Week): 270, Intensity: Moderate, Exercise limited by: None identified Cardiac risk factors: Cardiac Risk Factors include: advanced age (>69men, >36 women);diabetes mellitus;dyslipidemia;hypertension;male gender;smoking/ tobacco exposure Depression/mood screen:   Depression screen [facility] 2/9 11/03/2017  Decreased Interest 0  Down, Depressed, Hopeless 0  PHQ - 2 Score 0    ADLs:  In your present state of health, do you have any difficulty performing the following activities: 11/03/2017 07/31/2017  Hearing? N N  Comment - -  Vision? N N  Difficulty concentrating or making decisions? N N  Walking or climbing stairs? N N  Dressing or bathing? N N  Doing errands, shopping? N N  Some recent data might be hidden     Cognitive Testing  Alert? Yes  Normal Appearance?Yes  Oriented to person? Yes  Place? Yes   Time? Yes  Recall of three objects?  Yes  Can perform simple calculations? Yes  Displays appropriate judgment?Yes  Can read the correct time from a watch face?Yes  EOL planning: Does Patient Have a Medical Advance Directive?: Yes Type of Advance Directive: Healthcare Power of Attorney, Living will Does patient want to make changes to medical advance directive?: No - Patient declined Copy of [place] in Chart?: Yes  Review of Systems  Constitutional: Negative for malaise/fatigue and weight loss.  HENT: Positive for ear pain and hearing loss. Negative for congestion, ear discharge, sore throat and tinnitus.   Eyes: Negative for blurred vision and double vision.  Respiratory: Negative for cough, shortness of breath and wheezing.   Cardiovascular: Negative for chest pain, palpitations, orthopnea, claudication and leg swelling.  Gastrointestinal: Negative for abdominal pain, blood in stool, constipation, diarrhea, heartburn, melena,  nausea and vomiting.  Genitourinary: Negative.   Musculoskeletal: Negative for falls, joint pain and myalgias.  Skin: Negative for rash.  Neurological: Negative for dizziness, tingling, sensory change, weakness and headaches.  Endo/Heme/Allergies: Negative for polydipsia.  Psychiatric/Behavioral: Negative.  Negative for depression and memory loss.  All other systems reviewed and are negative.    Objective:     Today's Vitals   11/03/17 1008  BP: 124/68  Pulse: 63  Temp: 98.6 F (37 C)  SpO2: 97%  Weight: 182 lb 9.6 oz (82.8 kg)  Height: 5' 7.75" (1.721 m)   Body mass index is 27.97 kg/m.  General appearance: alert, no distress, WD/WN, male HEENT: normocephalic, sclerae anicteric, bilateral external auditory canals impacted with soft wax, pharynx normal Oral cavity: MMM, no lesions Neck: supple, no lymphadenopathy, no thyromegaly, no masses Heart: RRR, normal S1, S2, no murmurs Lungs: CTA bilaterally, no wheezes, rhonchi, or rales Abdomen: +bs, soft, non tender, non distended, no masses, no hepatomegaly, no splenomegaly Musculoskeletal: nontender, no swelling, no obvious deformity Extremities: no edema,  no cyanosis, no clubbing Pulses: 2+ symmetric, upper and lower extremities, normal cap refill Neurological: alert, oriented x 3, CN2-12 intact, strength normal upper extremities and lower extremities, sensation normal throughout, DTRs 2+ throughout, no cerebellar signs, gait normal Psychiatric: normal affect, behavior normal, pleasant   Medicare Attestation I have personally reviewed: The patient's medical and social history Their use of alcohol, tobacco or illicit drugs Their current medications and supplements The patient's functional ability including ADLs,fall risks, home safety risks, cognitive, and hearing and visual impairment Diet and physical activities Evidence for depression or mood disorders  The patient's weight, height, BMI, and visual acuity have been  recorded in the chart.  I have made referrals, counseling, and provided education to the patient based on review of the above and I have provided the patient with a written personalized care plan for preventive services.     Izora Ribas, NP   11/03/2017

## 2017-11-03 ENCOUNTER — Encounter: Payer: Self-pay | Admitting: Adult Health

## 2017-11-03 ENCOUNTER — Telehealth: Payer: Self-pay | Admitting: Internal Medicine

## 2017-11-03 ENCOUNTER — Ambulatory Visit (INDEPENDENT_AMBULATORY_CARE_PROVIDER_SITE_OTHER): Payer: PPO | Admitting: Adult Health

## 2017-11-03 VITALS — BP 124/68 | HR 63 | Temp 98.6°F | Ht 67.75 in | Wt 182.6 lb

## 2017-11-03 DIAGNOSIS — N529 Male erectile dysfunction, unspecified: Secondary | ICD-10-CM | POA: Diagnosis not present

## 2017-11-03 DIAGNOSIS — Z0001 Encounter for general adult medical examination with abnormal findings: Secondary | ICD-10-CM

## 2017-11-03 DIAGNOSIS — Z1211 Encounter for screening for malignant neoplasm of colon: Secondary | ICD-10-CM

## 2017-11-03 DIAGNOSIS — Z86010 Personal history of colonic polyps: Secondary | ICD-10-CM

## 2017-11-03 DIAGNOSIS — I10 Essential (primary) hypertension: Secondary | ICD-10-CM

## 2017-11-03 DIAGNOSIS — H6123 Impacted cerumen, bilateral: Secondary | ICD-10-CM | POA: Diagnosis not present

## 2017-11-03 DIAGNOSIS — E1122 Type 2 diabetes mellitus with diabetic chronic kidney disease: Secondary | ICD-10-CM

## 2017-11-03 DIAGNOSIS — Z79899 Other long term (current) drug therapy: Secondary | ICD-10-CM | POA: Diagnosis not present

## 2017-11-03 DIAGNOSIS — N183 Chronic kidney disease, stage 3 (moderate): Secondary | ICD-10-CM

## 2017-11-03 DIAGNOSIS — E782 Mixed hyperlipidemia: Secondary | ICD-10-CM | POA: Diagnosis not present

## 2017-11-03 DIAGNOSIS — R6889 Other general symptoms and signs: Secondary | ICD-10-CM

## 2017-11-03 DIAGNOSIS — E663 Overweight: Secondary | ICD-10-CM | POA: Diagnosis not present

## 2017-11-03 DIAGNOSIS — K219 Gastro-esophageal reflux disease without esophagitis: Secondary | ICD-10-CM

## 2017-11-03 DIAGNOSIS — R350 Frequency of micturition: Secondary | ICD-10-CM

## 2017-11-03 DIAGNOSIS — Z0000 Encounter for general adult medical examination without abnormal findings: Secondary | ICD-10-CM

## 2017-11-03 DIAGNOSIS — N401 Enlarged prostate with lower urinary tract symptoms: Secondary | ICD-10-CM | POA: Diagnosis not present

## 2017-11-03 DIAGNOSIS — E559 Vitamin D deficiency, unspecified: Secondary | ICD-10-CM | POA: Diagnosis not present

## 2017-11-03 NOTE — Telephone Encounter (Signed)
sure

## 2017-11-03 NOTE — Telephone Encounter (Signed)
See Dr. Blanch Media response.

## 2017-11-03 NOTE — Telephone Encounter (Signed)
Dr. Perry please see below and advise. 

## 2017-11-03 NOTE — Patient Instructions (Addendum)
Use a dropper or use a cap to put peroxide, olive oil,mineral oil or canola oil in the effected ear- 2-3 times a week. Let it soak for 20-30 min then you can take a shower or use a baby bulb with warm water to wash out the ear wax.  Do not use Qtips    Earwax Buildup, Adult The ears produce a substance called earwax that helps keep bacteria out of the ear and protects the skin in the ear canal. Occasionally, earwax can build up in the ear and cause discomfort or hearing loss. What increases the risk? This condition is more likely to develop in people who:  Are male.  Are elderly.  Naturally produce more earwax.  Clean their ears often with cotton swabs.  Use earplugs often.  Use in-ear headphones often.  Wear hearing aids.  Have narrow ear canals.  Have earwax that is overly thick or sticky.  Have eczema.  Are dehydrated.  Have excess hair in the ear canal.  What are the signs or symptoms? Symptoms of this condition include:  Reduced or muffled hearing.  A feeling of fullness in the ear or feeling that the ear is plugged.  Fluid coming from the ear.  Ear pain.  Ear itch.  Ringing in the ear.  Coughing.  An obvious piece of earwax that can be seen inside the ear canal.  How is this diagnosed? This condition may be diagnosed based on:  Your symptoms.  Your medical history.  An ear exam. During the exam, your health care provider will look into your ear with an instrument called an otoscope.  You may have tests, including a hearing test. How is this treated? This condition may be treated by:  Using ear drops to soften the earwax.  Having the earwax removed by a health care provider. The health care provider may: ? Flush the ear with water. ? Use an instrument that has a loop on the end (curette). ? Use a suction device.  Surgery to remove the wax buildup. This may be done in severe cases.  Follow these instructions at home:  Take  over-the-counter and prescription medicines only as told by your health care provider.  Do not put any objects, including cotton swabs, into your ear. You can clean the opening of your ear canal with a washcloth or facial tissue.  Follow instructions from your health care provider about cleaning your ears. Do not over-clean your ears.  Drink enough fluid to keep your urine clear or pale yellow. This will help to thin the earwax.  Keep all follow-up visits as told by your health care provider. If earwax builds up in your ears often or if you use hearing aids, consider seeing your health care provider for routine, preventive ear cleanings. Ask your health care provider how often you should schedule your cleanings.  If you have hearing aids, clean them according to instructions from the manufacturer and your health care provider. Contact a health care provider if:  You have ear pain.  You develop a fever.  You have blood, pus, or other fluid coming from your ear.  You have hearing loss.  You have ringing in your ears that does not go away.  Your symptoms do not improve with treatment.  You feel like the room is spinning (vertigo). Summary  Earwax can build up in the ear and cause discomfort or hearing loss.  The most common symptoms of this condition include reduced or muffled hearing and   a feeling of fullness in the ear or feeling that the ear is plugged.  This condition may be diagnosed based on your symptoms, your medical history, and an ear exam.  This condition may be treated by using ear drops to soften the earwax or by having the earwax removed by a health care provider.  Do not put any objects, including cotton swabs, into your ear. You can clean the opening of your ear canal with a washcloth or facial tissue. This information is not intended to replace advice given to you by your health care provider. Make sure you discuss any questions you have with your health care  provider. Document Released: 10/06/2004 Document Revised: 11/09/2016 Document Reviewed: 11/09/2016 Elsevier Interactive Patient Education  2018 Elsevier Inc.  

## 2017-11-04 LAB — CBC WITH DIFFERENTIAL/PLATELET
BASOS ABS: 38 {cells}/uL (ref 0–200)
BASOS PCT: 0.8 %
EOS ABS: 110 {cells}/uL (ref 15–500)
Eosinophils Relative: 2.3 %
HEMATOCRIT: 46.9 % (ref 38.5–50.0)
HEMOGLOBIN: 15.7 g/dL (ref 13.2–17.1)
LYMPHS ABS: 1430 {cells}/uL (ref 850–3900)
MCH: 29.8 pg (ref 27.0–33.0)
MCHC: 33.5 g/dL (ref 32.0–36.0)
MCV: 89 fL (ref 80.0–100.0)
MONOS PCT: 8.6 %
MPV: 11.7 fL (ref 7.5–12.5)
NEUTROS ABS: 2808 {cells}/uL (ref 1500–7800)
Neutrophils Relative %: 58.5 %
Platelets: 151 10*3/uL (ref 140–400)
RBC: 5.27 10*6/uL (ref 4.20–5.80)
RDW: 12.2 % (ref 11.0–15.0)
Total Lymphocyte: 29.8 %
WBC: 4.8 10*3/uL (ref 3.8–10.8)
WBCMIX: 413 {cells}/uL (ref 200–950)

## 2017-11-04 LAB — HEPATIC FUNCTION PANEL
AG RATIO: 1.9 (calc) (ref 1.0–2.5)
ALKALINE PHOSPHATASE (APISO): 68 U/L (ref 40–115)
ALT: 13 U/L (ref 9–46)
AST: 9 U/L — ABNORMAL LOW (ref 10–35)
Albumin: 4.3 g/dL (ref 3.6–5.1)
BILIRUBIN INDIRECT: 0.8 mg/dL (ref 0.2–1.2)
BILIRUBIN TOTAL: 1 mg/dL (ref 0.2–1.2)
Bilirubin, Direct: 0.2 mg/dL (ref 0.0–0.2)
GLOBULIN: 2.3 g/dL (ref 1.9–3.7)
TOTAL PROTEIN: 6.6 g/dL (ref 6.1–8.1)

## 2017-11-04 LAB — HEMOGLOBIN A1C
EAG (MMOL/L): 8.2 (calc)
Hgb A1c MFr Bld: 6.8 % of total Hgb — ABNORMAL HIGH (ref <5.7)
Mean Plasma Glucose: 148 (calc)

## 2017-11-04 LAB — BASIC METABOLIC PANEL WITH GFR
BUN / CREAT RATIO: 11 (calc) (ref 6–22)
BUN: 14 mg/dL (ref 7–25)
CO2: 27 mmol/L (ref 20–32)
CREATININE: 1.25 mg/dL — AB (ref 0.70–1.18)
Calcium: 9 mg/dL (ref 8.6–10.3)
Chloride: 106 mmol/L (ref 98–110)
GFR, EST AFRICAN AMERICAN: 65 mL/min/{1.73_m2} (ref >=60)
GFR, EST NON AFRICAN AMERICAN: 56 mL/min/{1.73_m2} — AB (ref >=60)
GLUCOSE: 124 mg/dL — AB (ref 65–99)
Potassium: 4.2 mmol/L (ref 3.5–5.3)
SODIUM: 141 mmol/L (ref 135–146)

## 2017-11-04 LAB — LIPID PANEL
CHOL/HDL RATIO: 3.5 (calc) (ref <5.0)
Cholesterol: 170 mg/dL (ref <200)
HDL: 48 mg/dL (ref >40)
LDL Cholesterol (Calc): 103 mg/dL (calc) — ABNORMAL HIGH
NON-HDL CHOLESTEROL (CALC): 122 mg/dL (ref <130)
TRIGLYCERIDES: 92 mg/dL (ref <150)

## 2017-11-04 LAB — TSH: TSH: 2.19 m[IU]/L (ref 0.40–4.50)

## 2017-12-02 DIAGNOSIS — Z79899 Other long term (current) drug therapy: Secondary | ICD-10-CM | POA: Diagnosis not present

## 2017-12-02 DIAGNOSIS — N281 Cyst of kidney, acquired: Secondary | ICD-10-CM | POA: Diagnosis not present

## 2017-12-02 DIAGNOSIS — K769 Liver disease, unspecified: Secondary | ICD-10-CM | POA: Diagnosis not present

## 2017-12-02 DIAGNOSIS — N132 Hydronephrosis with renal and ureteral calculous obstruction: Secondary | ICD-10-CM | POA: Diagnosis not present

## 2017-12-02 DIAGNOSIS — M545 Low back pain: Secondary | ICD-10-CM | POA: Diagnosis not present

## 2017-12-02 DIAGNOSIS — N201 Calculus of ureter: Secondary | ICD-10-CM | POA: Diagnosis not present

## 2017-12-02 DIAGNOSIS — Z7982 Long term (current) use of aspirin: Secondary | ICD-10-CM | POA: Diagnosis not present

## 2017-12-02 DIAGNOSIS — R112 Nausea with vomiting, unspecified: Secondary | ICD-10-CM | POA: Diagnosis not present

## 2017-12-02 DIAGNOSIS — K5790 Diverticulosis of intestine, part unspecified, without perforation or abscess without bleeding: Secondary | ICD-10-CM | POA: Diagnosis not present

## 2017-12-02 DIAGNOSIS — R109 Unspecified abdominal pain: Secondary | ICD-10-CM | POA: Diagnosis not present

## 2017-12-02 DIAGNOSIS — M549 Dorsalgia, unspecified: Secondary | ICD-10-CM | POA: Diagnosis not present

## 2017-12-04 NOTE — Progress Notes (Signed)
Subjective:    Patient ID: Edward [name], male    DOB: [birth year], 77 y.o.   MRN: [id number]  HPI 77 y.o. WM with history of kidney stones, HTN, DM2 with CKD, BPH presents for ER follow up for kidney stone. He was seen in the ER at [facility] 12/02/17 for Nausea and flank pain, CT AB shows non obstructing stones left and right, with left stone 0.7cm with hydronephrosis. He got hydrocodone 5mg  #20 and zofran. He states that he continue to have pain, comes and goes at times can be a 9 out of 10, has some constipation. Has constant pain, better with hot water/heating pad.   He is not on a diuretic, he was drinking sweet tea before this. Normal calcium.   Lab Results  Component Value Date   GFRNONAA 56 (L) 11/03/2017    Blood pressure 140/70, pulse 86, temperature 97.7 F (36.5 C), resp. rate 16, height 5' 7.75" (1.721 m), weight 185 lb 3.2 oz (84 kg), SpO2 97 %.  Medications Current Outpatient Medications on File Prior to Visit  Medication Sig  . APPLE CIDER VINEGAR PO Take by mouth.  Marland Kitchen aspirin 81 MG tablet Take 81 mg by mouth daily.  . Cholecalciferol (VITAMIN D PO) Take 5,000 Units by mouth daily.  Marland Kitchen CINNAMON PO Take by mouth. Take 1-2 tablets daily  . Omega-3 Fatty Acids (FISH OIL) 1000 MG CAPS Take 1 capsule by mouth daily.  . pantoprazole (PROTONIX) 40 MG tablet TAKE ONE TABLET BY MOUTH ONCE DAILY FOR  ACID  INDIGESTION  . pravastatin (PRAVACHOL) 40 MG tablet Take 1 tablet (40 mg total) by mouth daily.  . ranitidine (ZANTAC) 150 MG tablet TAKE 1 TABLET BY MOUTH AT BEDTIME  . sildenafil (REVATIO) 20 MG tablet TAKE ONE TO FIVE TABLETS BY MOUTH ONE HOUR BEFORE NEEDED  . tamsulosin (FLOMAX) 0.4 MG CAPS capsule Take 1 capsule (0.4 mg total) by mouth daily.   No current facility-administered medications on file prior to visit.     Problem list He has Hyperlipidemia; GERD (gastroesophageal reflux disease); Hypertension; Vitamin D deficiency; ED (erectile dysfunction); BPH (benign  prostatic hyperplasia); Medication management; Overweight (BMI 25.0-29.9); Type 2 diabetes mellitus ([place]); Encounter for Medicare annual wellness exam; History of adenomatous polyp of colon; and CKD stage 3 due to type 2 diabetes mellitus ([place]) on their problem list.   Review of Systems  Constitutional: Negative.  Negative for chills and fever.  Respiratory: Negative.   Cardiovascular: Negative.   Gastrointestinal: Positive for constipation and nausea. Negative for abdominal distention, abdominal pain, anal bleeding, blood in stool, diarrhea and vomiting.  Genitourinary: Positive for flank pain. Negative for decreased urine volume, difficulty urinating, discharge, dysuria, enuresis, frequency, genital sores, hematuria, penile pain, scrotal swelling, testicular pain and urgency.  Musculoskeletal: Positive for back pain.  Skin: Negative.  Negative for rash.       Objective:   Physical Exam  Constitutional: He appears well-developed and well-nourished. No distress.  Cardiovascular: Normal rate and regular rhythm.  No murmur heard. Pulmonary/Chest: Effort normal and breath sounds normal.  Abdominal: Soft. Bowel sounds are normal. There is no tenderness. There is CVA tenderness (left). There is no rebound and no guarding.  Skin: He is not diaphoretic.       Assessment & Plan:   Kidney stone on left side -     ketorolac (TORADOL) injection 60 mg -     Ambulatory referral to Urology - continue to take the hydrocodone as needed - increase  water, increase lemon juice/lemonade   The patient was advised to call immediately if he has any concerning symptoms in the interval. The patient voices understanding of current treatment options and is in agreement with the current care plan.The patient knows to call the clinic with any problems, questions or concerns or go to the ER if any further progression of symptoms.

## 2017-12-05 ENCOUNTER — Encounter: Payer: Self-pay | Admitting: Physician Assistant

## 2017-12-05 ENCOUNTER — Ambulatory Visit (INDEPENDENT_AMBULATORY_CARE_PROVIDER_SITE_OTHER): Payer: PPO | Admitting: Physician Assistant

## 2017-12-05 ENCOUNTER — Encounter: Payer: Self-pay | Admitting: Adult Health

## 2017-12-05 VITALS — BP 140/70 | HR 86 | Temp 97.7°F | Resp 16 | Ht 67.75 in | Wt 185.2 lb

## 2017-12-05 DIAGNOSIS — N200 Calculus of kidney: Secondary | ICD-10-CM | POA: Diagnosis not present

## 2017-12-05 MED ORDER — KETOROLAC TROMETHAMINE 60 MG/2ML IM SOLN
60.0000 mg | Freq: Once | INTRAMUSCULAR | Status: AC
  Administered 2017-12-05: 60 mg via INTRAMUSCULAR

## 2017-12-05 NOTE — Patient Instructions (Signed)
Do lemon water Lemon juice/lemonade Increase water No tea/soda Will refer to urology   Dietary Guidelines to Help Prevent Kidney Stones Kidney stones are deposits of minerals and salts that form inside your kidneys. Your risk of developing kidney stones may be greater depending on your diet, your lifestyle, the medicines you take, and whether you have certain medical conditions. Most people can reduce their chances of developing kidney stones by following the instructions below. Depending on your overall health and the type of kidney stones you tend to develop, your dietitian may give you more specific instructions. What are tips for following this plan? Reading food labels  Choose foods with "no salt added" or "low-salt" labels. Limit your sodium intake to less than 1500 mg per day.  Choose foods with calcium for each meal and snack. Try to eat about 300 mg of calcium at each meal. Foods that contain 200-500 mg of calcium per serving include: ? 8 oz (237 ml) of milk, fortified nondairy milk, and fortified fruit juice. ? 8 oz (237 ml) of kefir, yogurt, and soy yogurt. ? 4 oz (118 ml) of tofu. ? 1 oz of cheese. ? 1 cup (300 g) of dried figs. ? 1 cup (91 g) of cooked broccoli. ? 1-3 oz can of sardines or mackerel.  Most people need 1000 to 1500 mg of calcium each day. Talk to your dietitian about how much calcium is recommended for you. Shopping  Buy plenty of fresh fruits and vegetables. Most people do not need to avoid fruits and vegetables, even if they contain nutrients that may contribute to kidney stones.  When shopping for convenience foods, choose: ? Whole pieces of fruit. ? Premade salads with dressing on the side. ? Low-fat fruit and yogurt smoothies.  Avoid buying frozen meals or prepared deli foods.  Look for foods with live cultures, such as yogurt and kefir. Cooking  Do not add salt to food when cooking. Place a salt shaker on the table and allow each person to add  his or her own salt to taste.  Use vegetable protein, such as beans, textured vegetable protein (TVP), or tofu instead of meat in pasta, casseroles, and soups. Meal planning  Eat less salt, if told by your dietitian. To do this: ? Avoid eating processed or premade food. ? Avoid eating fast food.  Eat less animal protein, including cheese, meat, poultry, or fish, if told by your dietitian. To do this: ? Limit the number of times you have meat, poultry, fish, or cheese each week. Eat a diet free of meat at least 2 days a week. ? Eat only one serving each day of meat, poultry, fish, or seafood. ? When you prepare animal protein, cut pieces into small portion sizes. For most meat and fish, one serving is about the size of one deck of cards.  Eat at least 5 servings of fresh fruits and vegetables each day. To do this: ? Keep fruits and vegetables on hand for snacks. ? Eat 1 piece of fruit or a handful of berries with breakfast. ? Have a salad and fruit at lunch. ? Have two kinds of vegetables at dinner.  Limit foods that are high in a substance called oxalate. These include: ? Spinach. ? Rhubarb. ? Beets. ? Potato chips and french fries. ? Nuts.  If you regularly take a diuretic medicine, make sure to eat at least 1-2 fruits or vegetables high in potassium each day. These include: ? Avocado. ? Banana. ? Orange,  prune, carrot, or tomato juice. ? Baked potato. ? Cabbage. ? Beans and split peas. General instructions  Drink enough fluid to keep your urine clear or pale yellow. This is the most important thing you can do.  Talk to your health care provider and dietitian about taking daily supplements. Depending on your health and the cause of your kidney stones, you may be advised: ? Not to take supplements with vitamin C. ? To take a calcium supplement. ? To take a daily probiotic supplement. ? To take other supplements such as magnesium, fish oil, or vitamin B6.  Take all  medicines and supplements as told by your health care provider.  Limit alcohol intake to no more than 1 drink a day for nonpregnant women and 2 drinks a day for men. One drink equals 12 oz of beer, 5 oz of wine, or 1 oz of hard liquor.  Lose weight if told by your health care provider. Work with your dietitian to find strategies and an eating plan that works best for you. What foods are not recommended? Limit your intake of the following foods, or as told by your dietitian. Talk to your dietitian about specific foods you should avoid based on the type of kidney stones and your overall health. Grains Breads. Bagels. Rolls. Baked goods. Salted crackers. Cereal. Pasta. Vegetables Spinach. Rhubarb. Beets. Canned vegetables. Angie Fava. Olives. Meats and other protein foods Nuts. Nut butters. Large portions of meat, poultry, or fish. Salted or cured meats. Deli meats. Hot dogs. Sausages. Dairy Cheese. Beverages Regular soft drinks. Regular vegetable juice. Seasonings and other foods Seasoning blends with salt. Salad dressings. Canned soups. Soy sauce. Ketchup. Barbecue sauce. Canned pasta sauce. Casseroles. Pizza. Lasagna. Frozen meals. Potato chips. [place] fries. Summary  You can reduce your risk of kidney stones by making changes to your diet.  The most important thing you can do is drink enough fluid. You should drink enough fluid to keep your urine clear or pale yellow.  Ask your health care provider or dietitian how much protein from animal sources you should eat each day, and also how much salt and calcium you should have each day. This information is not intended to replace advice given to you by your health care provider. Make sure you discuss any questions you have with your health care provider. Document Released: 12/24/2010 Document Revised: 08/09/2016 Document Reviewed: 08/09/2016 Elsevier Interactive Patient Education  [employer].

## 2017-12-08 ENCOUNTER — Other Ambulatory Visit: Payer: Self-pay

## 2017-12-08 ENCOUNTER — Encounter (HOSPITAL_COMMUNITY): Payer: Self-pay | Admitting: *Deleted

## 2017-12-08 ENCOUNTER — Other Ambulatory Visit: Payer: Self-pay | Admitting: Urology

## 2017-12-08 DIAGNOSIS — N202 Calculus of kidney with calculus of ureter: Secondary | ICD-10-CM | POA: Diagnosis not present

## 2017-12-08 NOTE — Progress Notes (Signed)
Please sign orders in epic for lithotripsy; pt is scheduled for procedure on Monday 12/11/2017. Thanks.

## 2017-12-11 ENCOUNTER — Ambulatory Visit (HOSPITAL_COMMUNITY)
Admission: RE | Admit: 2017-12-11 | Discharge: 2017-12-11 | Disposition: A | Discharge status: Home / Self Care | Payer: PPO | Source: Ambulatory Visit | Attending: Urology | Admitting: Urology

## 2017-12-11 ENCOUNTER — Ambulatory Visit (HOSPITAL_COMMUNITY): Payer: PPO

## 2017-12-11 ENCOUNTER — Encounter (HOSPITAL_COMMUNITY)
Admission: RE | Disposition: A | Discharge status: Home / Self Care | Payer: Self-pay | Source: Ambulatory Visit | Attending: Urology

## 2017-12-11 ENCOUNTER — Other Ambulatory Visit: Payer: Self-pay

## 2017-12-11 ENCOUNTER — Encounter (HOSPITAL_COMMUNITY): Payer: Self-pay | Admitting: *Deleted

## 2017-12-11 DIAGNOSIS — Z87891 Personal history of nicotine dependence: Secondary | ICD-10-CM | POA: Insufficient documentation

## 2017-12-11 DIAGNOSIS — N132 Hydronephrosis with renal and ureteral calculous obstruction: Secondary | ICD-10-CM | POA: Insufficient documentation

## 2017-12-11 DIAGNOSIS — N201 Calculus of ureter: Secondary | ICD-10-CM | POA: Diagnosis not present

## 2017-12-11 DIAGNOSIS — N200 Calculus of kidney: Secondary | ICD-10-CM | POA: Diagnosis not present

## 2017-12-11 HISTORY — DX: Personal history of urinary calculi: Z87.442

## 2017-12-11 HISTORY — PX: EXTRACORPOREAL SHOCK WAVE LITHOTRIPSY: SHX1557

## 2017-12-11 HISTORY — DX: Prediabetes: R73.03

## 2017-12-11 SURGERY — LITHOTRIPSY, ESWL
Anesthesia: LOCAL | Laterality: Left

## 2017-12-11 MED ORDER — CIPROFLOXACIN HCL 500 MG PO TABS
500.0000 mg | ORAL_TABLET | ORAL | Status: AC
  Administered 2017-12-11: 500 mg via ORAL
  Filled 2017-12-11: qty 1

## 2017-12-11 MED ORDER — DIPHENHYDRAMINE HCL 25 MG PO CAPS
25.0000 mg | ORAL_CAPSULE | ORAL | Status: AC
  Administered 2017-12-11: 25 mg via ORAL
  Filled 2017-12-11: qty 1

## 2017-12-11 MED ORDER — DIAZEPAM 5 MG PO TABS
10.0000 mg | ORAL_TABLET | ORAL | Status: AC
  Administered 2017-12-11: 10 mg via ORAL
  Filled 2017-12-11: qty 2

## 2017-12-11 MED ORDER — SODIUM CHLORIDE 0.9 % IV SOLN
INTRAVENOUS | Status: DC
  Administered 2017-12-11: 17:00:00 via INTRAVENOUS

## 2017-12-11 NOTE — Discharge Instructions (Signed)
See printed discharge instructions.

## 2017-12-11 NOTE — Progress Notes (Signed)
Notified Dr Gloriann Loan that pt has not arrive for scheduled procedure; attempted to reach pt per telephone numbers provided; LVMM with [phone]; unable to LM on other provided number. Contacted admitting at [facility] whom have also not seen pt.

## 2017-12-12 ENCOUNTER — Encounter (HOSPITAL_COMMUNITY): Payer: Self-pay | Admitting: Urology

## 2017-12-25 DIAGNOSIS — N202 Calculus of kidney with calculus of ureter: Secondary | ICD-10-CM | POA: Diagnosis not present

## 2018-01-24 DIAGNOSIS — N202 Calculus of kidney with calculus of ureter: Secondary | ICD-10-CM | POA: Diagnosis not present

## 2018-02-06 DIAGNOSIS — D2271 Melanocytic nevi of right lower limb, including hip: Secondary | ICD-10-CM | POA: Diagnosis not present

## 2018-02-06 DIAGNOSIS — L570 Actinic keratosis: Secondary | ICD-10-CM | POA: Diagnosis not present

## 2018-02-06 DIAGNOSIS — D225 Melanocytic nevi of trunk: Secondary | ICD-10-CM | POA: Diagnosis not present

## 2018-02-06 DIAGNOSIS — L821 Other seborrheic keratosis: Secondary | ICD-10-CM | POA: Diagnosis not present

## 2018-02-15 ENCOUNTER — Ambulatory Visit (INDEPENDENT_AMBULATORY_CARE_PROVIDER_SITE_OTHER): Payer: PPO | Admitting: Internal Medicine

## 2018-02-15 VITALS — BP 124/68 | HR 56 | Temp 97.3°F | Resp 16 | Ht 67.75 in | Wt 178.6 lb

## 2018-02-15 DIAGNOSIS — N183 Chronic kidney disease, stage 3 unspecified: Secondary | ICD-10-CM

## 2018-02-15 DIAGNOSIS — Z79899 Other long term (current) drug therapy: Secondary | ICD-10-CM | POA: Diagnosis not present

## 2018-02-15 DIAGNOSIS — E782 Mixed hyperlipidemia: Secondary | ICD-10-CM | POA: Diagnosis not present

## 2018-02-15 DIAGNOSIS — E559 Vitamin D deficiency, unspecified: Secondary | ICD-10-CM

## 2018-02-15 DIAGNOSIS — I10 Essential (primary) hypertension: Secondary | ICD-10-CM

## 2018-02-15 DIAGNOSIS — E1122 Type 2 diabetes mellitus with diabetic chronic kidney disease: Secondary | ICD-10-CM | POA: Diagnosis not present

## 2018-02-15 NOTE — Progress Notes (Signed)
This very nice 77 y.o. MWM presents for 6 month follow up with HTN, HLD, Pre-Diabetes and Vitamin D Deficiency.      Patient is treated for HTN & BP has been controlled at home. Today's BP: 124/68. Patient has had no complaints of any cardiac type chest pain, palpitations, dyspnea / orthopnea / PND, dizziness, claudication, or dependent edema.     Hyperlipidemia is controlled with diet & meds. Patient denies myalgias or other med SE's. Last Lipids were  Lab Results  Component Value Date   CHOL 170 11/03/2017   HDL 48 11/03/2017   LDLCALC 103 (H) 11/03/2017   TRIG 92 11/03/2017   CHOLHDL 3.5 11/03/2017      Also, the patient has history of PreDiabetes (A21c 6.1%/2011) and then T2_NIDDM (A1c 6.5%/2015 and 6.7%/2017) which he's attempting to control w/diet. He  symptoms of reactive hypoglycemia, diabetic polys, paresthesias or visual blurring.  Last A1c was not at goal:  Lab Results  Component Value Date   HGBA1C 6.8 (H) 11/03/2017      Further, the patient also has history of Vitamin D Deficiency ("29"/2008)  and supplements vitamin D without any suspected side-effects. Last vitamin D was at goal: Lab Results  Component Value Date   VD25OH 63 07/31/2017   Current Outpatient Medications on File Prior to Visit  Medication Sig  . APPLE CIDER VINEGAR PO Take by mouth.  Marland Kitchen aspirin 81 MG tablet Take 81 mg by mouth daily.  . Cholecalciferol (VITAMIN D PO) Take 5,000 Units by mouth daily.  Marland Kitchen CINNAMON PO Take by mouth. Take 1-2 tablets daily  . HYDROcodone-acetaminophen (NORCO/VICODIN) 5-325 MG tablet Take 1-2 tablets by mouth every 6 (six) hours as needed for moderate pain.  . Omega-3 Fatty Acids (FISH OIL) 1000 MG CAPS Take 1 capsule by mouth daily.  . pantoprazole (PROTONIX) 40 MG tablet TAKE ONE TABLET BY MOUTH ONCE DAILY FOR  ACID  INDIGESTION  . pravastatin (PRAVACHOL) 40 MG tablet Take 1 tablet (40 mg total) by mouth daily.  . ranitidine (ZANTAC) 150 MG tablet TAKE 1 TABLET BY  MOUTH AT BEDTIME  . sildenafil (REVATIO) 20 MG tablet TAKE ONE TO FIVE TABLETS BY MOUTH ONE HOUR BEFORE NEEDED  . tamsulosin (FLOMAX) 0.4 MG CAPS capsule Take 1 capsule (0.4 mg total) by mouth daily.   No current facility-administered medications on file prior to visit.    Allergies  Allergen Reactions  . Bystolic [Nebivolol Hcl]    PMHx:   Past Medical History:  Diagnosis Date  . BPH (benign prostatic hyperplasia)   . ED (erectile dysfunction)   . GERD (gastroesophageal reflux disease)   . History of kidney stones   . Hyperlipidemia   . Hypertension   . IBS (irritable bowel syndrome)   . LBBB (left bundle branch block)   . Personal history of kidney stones   . Pre-diabetes   . Prediabetes   . Vitamin D deficiency    Immunization History  Administered Date(s) Administered  . DT 04/08/2014  . Influenza, High Dose Seasonal PF 06/24/2014, 07/28/2015, 05/02/2016, 07/31/2017  . Influenza-Unspecified 07/02/2013  . Pneumococcal Conjugate-13 04/09/2015  . Pneumococcal-Unspecified 10/02/2007  . Td 10/02/2003  . Zoster 10/01/2006   Past Surgical History:  Procedure Laterality Date  . CATARACT EXTRACTION W/ INTRAOCULAR LENS IMPLANT  2013   left  . EXTRACORPOREAL SHOCK WAVE LITHOTRIPSY Left 12/11/2017   Procedure: LEFT EXTRACORPOREAL SHOCK WAVE LITHOTRIPSY (ESWL);  Surgeon: Lucas Mallow, MD;  Location: [facility];  Service: Urology;  Laterality: Left;  . KIDNEY STONE SURGERY     FHx:    Reviewed / unchanged  SHx:    Reviewed / unchanged   Systems Review:  Constitutional: Denies fever, chills, wt changes, headaches, insomnia, fatigue, night sweats, change in appetite. Eyes: Denies redness, blurred vision, diplopia, discharge, itchy, watery eyes.  ENT: Denies discharge, congestion, post nasal drip, epistaxis, sore throat, earache, hearing loss, dental pain, tinnitus, vertigo, sinus pain, snoring.  CV: Denies chest pain, palpitations, irregular heartbeat, syncope, dyspnea,  diaphoresis, orthopnea, PND, claudication or edema. Respiratory: denies cough, dyspnea, DOE, pleurisy, hoarseness, laryngitis, wheezing.  Gastrointestinal: Denies dysphagia, odynophagia, heartburn, reflux, water brash, abdominal pain or cramps, nausea, vomiting, bloating, diarrhea, constipation, hematemesis, melena, hematochezia  or hemorrhoids. Genitourinary: Denies dysuria, frequency, urgency, nocturia, hesitancy, discharge, hematuria or flank pain. Musculoskeletal: Denies arthralgias, myalgias, stiffness, jt. swelling, pain, limping or strain/sprain.  Skin: Denies pruritus, rash, hives, warts, acne, eczema or change in skin lesion(s). Neuro: No weakness, tremor, incoordination, spasms, paresthesia or pain. Psychiatric: Denies confusion, memory loss or sensory loss. Endo: Denies change in weight, skin or hair change.  Heme/Lymph: No excessive bleeding, bruising or enlarged lymph nodes.  Physical Exam  BP 124/68   Pulse (!) 56   Temp (!) 97.3 F (36.3 C)   Resp 16   Ht 5' 7.75" (1.721 m)   Wt 178 lb 9.6 oz (81 kg)   BMI 27.36 kg/m   Appears  well nourished, well groomed  and in no distress.  Eyes: PERRLA, EOMs, conjunctiva no swelling or erythema. Sinuses: No frontal/maxillary tenderness ENT/Mouth: EAC's clear, TM's nl w/o erythema, bulging. Nares clear w/o erythema, swelling, exudates. Oropharynx clear without erythema or exudates. Oral hygiene is good. Tongue normal, non obstructing. Hearing intact.  Neck: Supple. Thyroid not palpable. Car 2+/2+ without bruits, nodes or JVD. Chest: Respirations nl with BS clear & equal w/o rales, rhonchi, wheezing or stridor.  Cor: Heart sounds normal w/ regular rate and rhythm without sig. murmurs, gallops, clicks or rubs. Peripheral pulses normal and equal  without edema.  Abdomen: Soft & bowel sounds normal. Non-tender w/o guarding, rebound, hernias, masses or organomegaly.  Lymphatics: Unremarkable.  Musculoskeletal: Full ROM all peripheral  extremities, joint stability, 5/5 strength and normal gait.  Skin: Warm, dry without exposed rashes, lesions or ecchymosis apparent.  Neuro: Cranial nerves intact, reflexes equal bilaterally. Sensory-motor testing grossly intact. Tendon reflexes grossly intact.  Pysch: Alert & oriented x 3.  Insight and judgement nl & appropriate. No ideations.  Assessment and Plan:  1. Essential hypertension  - Continue medication, monitor blood pressure at home.  - Continue DASH diet.  Reminder to go to the ER if any CP,  SOB, nausea, dizziness, severe HA, changes vision/speech.  - CBC with Differential/Platelet - COMPLETE METABOLIC PANEL WITH GFR - Magnesium - TSH  2. Hyperlipidemia, mixed  - Continue diet/meds, exercise,& lifestyle modifications.  - Continue monitor periodic cholesterol/liver & renal functions   - Lipid panel - TSH  3. Type 2 diabetes mellitus with stage 3 chronic kidney disease, without long-term current use of insulin (HCC)  - Continue diet, exercise, lifestyle modifications.  - Monitor appropriate labs.  - Hemoglobin A1c - Insulin, random  4. Vitamin D deficiency  - Continue supplementation.  - VITAMIN D 25 Hydroxyl  5. Medication management  - CBC with Differential/Platelet - COMPLETE METABOLIC PANEL WITH GFR - Magnesium - Lipid panel - TSH - Hemoglobin A1c - Insulin, random - VITAMIN D 25 Hydroxyl  Discussed  regular exercise, BP monitoring, weight control to achieve/maintain BMI less than 25 and discussed med and SE's. Recommended labs to assess and monitor clinical status with further disposition pending results of labs. Over 30 minutes of exam, counseling, chart review was performed.

## 2018-02-15 NOTE — Patient Instructions (Signed)

## 2018-02-16 LAB — CBC WITH DIFFERENTIAL/PLATELET
BASOS PCT: 0.2 %
Basophils Absolute: 8 cells/uL (ref 0–200)
EOS PCT: 1.9 %
Eosinophils Absolute: 80 cells/uL (ref 15–500)
HCT: 42.7 % (ref 38.5–50.0)
Hemoglobin: 14.4 g/dL (ref 13.2–17.1)
LYMPHS ABS: 1474 {cells}/uL (ref 850–3900)
MCH: 29.5 pg (ref 27.0–33.0)
MCHC: 33.7 g/dL (ref 32.0–36.0)
MCV: 87.5 fL (ref 80.0–100.0)
MPV: 11.7 fL (ref 7.5–12.5)
Monocytes Relative: 8.8 %
NEUTROS ABS: 2268 {cells}/uL (ref 1500–7800)
NEUTROS PCT: 54 %
Platelets: 147 10*3/uL (ref 140–400)
RBC: 4.88 10*6/uL (ref 4.20–5.80)
RDW: 13.5 % (ref 11.0–15.0)
Total Lymphocyte: 35.1 %
WBC mixed population: 370 cells/uL (ref 200–950)
WBC: 4.2 10*3/uL (ref 3.8–10.8)

## 2018-02-16 LAB — COMPLETE METABOLIC PANEL WITH GFR
AG RATIO: 2 (calc) (ref 1.0–2.5)
ALT: 14 U/L (ref 9–46)
AST: 11 U/L (ref 10–35)
Albumin: 4.1 g/dL (ref 3.6–5.1)
Alkaline phosphatase (APISO): 58 U/L (ref 40–115)
BUN/Creatinine Ratio: 13 (calc) (ref 6–22)
BUN: 18 mg/dL (ref 7–25)
CALCIUM: 8.9 mg/dL (ref 8.6–10.3)
CO2: 27 mmol/L (ref 20–32)
CREATININE: 1.38 mg/dL — AB (ref 0.70–1.18)
Chloride: 108 mmol/L (ref 98–110)
GFR, EST AFRICAN AMERICAN: 58 mL/min/{1.73_m2} — AB (ref >=60)
GFR, EST NON AFRICAN AMERICAN: 50 mL/min/{1.73_m2} — AB (ref >=60)
Globulin: 2.1 g/dL (calc) (ref 1.9–3.7)
Glucose, Bld: 152 mg/dL — ABNORMAL HIGH (ref 65–99)
POTASSIUM: 4.1 mmol/L (ref 3.5–5.3)
Sodium: 142 mmol/L (ref 135–146)
TOTAL PROTEIN: 6.2 g/dL (ref 6.1–8.1)
Total Bilirubin: 0.7 mg/dL (ref 0.2–1.2)

## 2018-02-16 LAB — TSH: TSH: 1.77 mIU/L (ref 0.40–4.50)

## 2018-02-16 LAB — LIPID PANEL
CHOL/HDL RATIO: 3.3 (calc) (ref <5.0)
CHOLESTEROL: 160 mg/dL (ref <200)
HDL: 48 mg/dL (ref >40)
LDL CHOLESTEROL (CALC): 98 mg/dL
NON-HDL CHOLESTEROL (CALC): 112 mg/dL (ref <130)
TRIGLYCERIDES: 54 mg/dL (ref <150)

## 2018-02-16 LAB — HEMOGLOBIN A1C
EAG (MMOL/L): 7.7 (calc)
Hgb A1c MFr Bld: 6.5 % of total Hgb — ABNORMAL HIGH (ref <5.7)
MEAN PLASMA GLUCOSE: 140 (calc)

## 2018-02-16 LAB — INSULIN, RANDOM: INSULIN: 11.7 u[IU]/mL (ref 2.0–19.6)

## 2018-02-16 LAB — VITAMIN D 25 HYDROXY (VIT D DEFICIENCY, FRACTURES): Vit D, 25-Hydroxy: 59 ng/mL (ref 30–100)

## 2018-02-16 LAB — MAGNESIUM: Magnesium: 2.1 mg/dL (ref 1.5–2.5)

## 2018-02-18 ENCOUNTER — Encounter: Payer: Self-pay | Admitting: Internal Medicine

## 2018-03-05 DIAGNOSIS — N202 Calculus of kidney with calculus of ureter: Secondary | ICD-10-CM | POA: Diagnosis not present

## 2018-03-13 LAB — HM DIABETES EYE EXAM

## 2018-04-12 ENCOUNTER — Encounter: Payer: Self-pay | Admitting: Internal Medicine

## 2018-05-21 ENCOUNTER — Ambulatory Visit: Payer: Self-pay | Admitting: Physician Assistant

## 2018-05-23 ENCOUNTER — Encounter: Payer: Self-pay | Admitting: Internal Medicine

## 2018-05-27 NOTE — Progress Notes (Signed)
FOLLOW UP  Assessment and Plan:   Hypertension -Continue medication, monitor blood pressure at home. Continue DASH diet.  Reminder to go to the ER if any CP, SOB, nausea, dizziness, severe HA, changes vision/speech, left arm numbness and tingling and jaw pain.  Cholesterol -Continue diet and exercise. Check cholesterol.    Diabetes with diabetic chronic kidney disease -Continue diet and exercise. Check A1C  Vitamin D Def - check level and continue medications.   Flu shot  Today  Mallet finger right 4th finger Get splint x 2-3 weeks, if not better refer to ortho  Continue diet and meds as discussed. Further disposition pending results of labs. Discussed med's effects and SE's.   Over 30 minutes of exam, counseling, chart review, and critical decision making was performed Future Appointments  Date Time Provider [place]  09/03/2018  9:00 AM Unk Pinto, MD [facility] None  11/07/2018 10:30 AM Liane Comber, NP [facility] None     HPI 77 y.o. male  presents for 3 month follow up on hypertension, cholesterol, diabetes and vitamin D deficiency.   He was playing church volley ball and the ball hit his right 4th finger and will not straighten.   His blood pressure has been controlled at home declines ACE/ARB at this time, today their BP is BP: 138/86   He does workout. He denies chest pain, shortness of breath, dizziness.   He is on cholesterol medication, pravastatin 40 and denies myalgias. His cholesterol is not at goal. The cholesterol last visit was:   Lab Results  Component Value Date   CHOL 160 02/15/2018   HDL 48 02/15/2018   LDLCALC 98 02/15/2018   TRIG 54 02/15/2018   CHOLHDL 3.3 02/15/2018    He has been working on diet and exercise for Diabetes with diabetic chronic kidney disease, he is on bASA, he is not on ACE/ARB, and denies paresthesia of the feet, polydipsia, polyuria and visual disturbances. Last A1C was:  Lab Results  Component Value Date    HGBA1C 6.5 (H) 02/15/2018     Lab Results  Component Value Date   GFRNONAA 50 (L) 02/15/2018   Patient is on Vitamin D supplement.   Lab Results  Component Value Date   VD25OH 8 02/15/2018         Current Medications:  Current Outpatient Medications on File Prior to Visit  Medication Sig  . APPLE CIDER VINEGAR PO Take by mouth.  Marland Kitchen aspirin 81 MG tablet Take 81 mg by mouth daily.  . Cholecalciferol (VITAMIN D PO) Take 5,000 Units by mouth daily.  Marland Kitchen CINNAMON PO Take by mouth. Take 1-2 tablets daily  . HYDROcodone-acetaminophen (NORCO/VICODIN) 5-325 MG tablet Take 1-2 tablets by mouth every 6 (six) hours as needed for moderate pain.  . Omega-3 Fatty Acids (FISH OIL) 1000 MG CAPS Take 1 capsule by mouth daily.  . pantoprazole (PROTONIX) 40 MG tablet TAKE ONE TABLET BY MOUTH ONCE DAILY FOR  ACID  INDIGESTION  . pravastatin (PRAVACHOL) 40 MG tablet Take 1 tablet (40 mg total) by mouth daily.  . ranitidine (ZANTAC) 150 MG tablet TAKE 1 TABLET BY MOUTH AT BEDTIME  . sildenafil (REVATIO) 20 MG tablet TAKE ONE TO FIVE TABLETS BY MOUTH ONE HOUR BEFORE NEEDED  . tamsulosin (FLOMAX) 0.4 MG CAPS capsule Take 1 capsule (0.4 mg total) by mouth daily.   No current facility-administered medications on file prior to visit.     Medical History:  Past Medical History:  Diagnosis Date  . BPH (  benign prostatic hyperplasia)   . ED (erectile dysfunction)   . GERD (gastroesophageal reflux disease)   . History of kidney stones   . Hyperlipidemia   . Hypertension   . IBS (irritable bowel syndrome)   . LBBB (left bundle branch block)   . Personal history of kidney stones   . Pre-diabetes   . Prediabetes   . Vitamin D deficiency    Allergies:  Allergies  Allergen Reactions  . Bystolic [Nebivolol Hcl]      Review of Systems:  Review of Systems  Constitutional: Negative.   HENT: Negative.   Eyes: Negative.   Respiratory: Negative.   Cardiovascular: Negative.   Gastrointestinal:  Negative.   Genitourinary: Negative.   Musculoskeletal: Positive for joint pain.  Skin: Negative.     Family history- Review and unchanged Social history- Review and unchanged Physical Exam: BP 138/86   Pulse (!) 56   Temp 98.7 F (37.1 C)   Resp 16   Ht 5' 7.75" (1.721 m)   Wt 182 lb (82.6 kg)   SpO2 98%   BMI 27.88 kg/m  Wt Readings from Last 3 Encounters:  05/30/18 182 lb (82.6 kg)  02/15/18 178 lb 9.6 oz (81 kg)  12/05/17 185 lb 3.2 oz (84 kg)   General Appearance: Well nourished, in no apparent distress. Eyes: PERRLA, EOMs, conjunctiva no swelling or erythema Sinuses: No Frontal/maxillary tenderness ENT/Mouth: Ext aud canals clear, TMs without erythema, bulging. No erythema, swelling, or exudate on post pharynx.  Tonsils not swollen or erythematous. Hearing normal.  Neck: Supple, thyroid normal.  Respiratory: Respiratory effort normal, BS equal bilaterally without rales, rhonchi, wheezing or stridor.  Cardio: RRR with no MRGs. Brisk peripheral pulses without edema.  Abdomen: Soft, + BS.  Non tender, no guarding, rebound, hernias, masses. Lymphatics: Non tender without lymphadenopathy.  Musculoskeletal: Full ROM, 5/5 strength, Normal gait. Right 4th finger DIP unable to extend without assistance, no pain, no warmth, no swelling.  Skin: Warm, dry without rashes, lesions, ecchymosis.  Neuro: Cranial nerves intact. No cerebellar symptoms.  Psych: Awake and oriented X 3, normal affect, Insight and Judgment appropriate.    Vicie Mutters, PA-C 11:00 AM [facility]

## 2018-05-30 ENCOUNTER — Encounter: Payer: Self-pay | Admitting: Physician Assistant

## 2018-05-30 ENCOUNTER — Ambulatory Visit (INDEPENDENT_AMBULATORY_CARE_PROVIDER_SITE_OTHER): Payer: PPO | Admitting: Physician Assistant

## 2018-05-30 VITALS — BP 138/86 | HR 56 | Temp 98.7°F | Resp 16 | Ht 67.75 in | Wt 182.0 lb

## 2018-05-30 DIAGNOSIS — Z23 Encounter for immunization: Secondary | ICD-10-CM

## 2018-05-30 DIAGNOSIS — N183 Chronic kidney disease, stage 3 (moderate): Secondary | ICD-10-CM

## 2018-05-30 DIAGNOSIS — E782 Mixed hyperlipidemia: Secondary | ICD-10-CM | POA: Diagnosis not present

## 2018-05-30 DIAGNOSIS — E1122 Type 2 diabetes mellitus with diabetic chronic kidney disease: Secondary | ICD-10-CM

## 2018-05-30 DIAGNOSIS — M20011 Mallet finger of right finger(s): Secondary | ICD-10-CM | POA: Diagnosis not present

## 2018-05-30 DIAGNOSIS — I10 Essential (primary) hypertension: Secondary | ICD-10-CM

## 2018-05-30 DIAGNOSIS — Z79899 Other long term (current) drug therapy: Secondary | ICD-10-CM

## 2018-05-30 NOTE — Patient Instructions (Addendum)
Mallet Finger/swan neck deformity Get a splint, if your finger is not better in 2-3 weeks we can refer you to ortho  Mallet finger is an injury that occurs from a blow to the tip of your straightened finger or thumb. It is also known as baseball finger. The blow to your fingertip causes it to bend farther than normal, which tears the cord that attaches to the tip of your finger (extensor tendon). Your extensor tendon is what straightens the end of your finger. If this tendon is damaged, you will not be able to straighten your fingertip. Sometimes, a piece of bone may be pulled away with the tendon (avulsion injury), or the tendon may tear completely. In some cases, surgery may be required to repair the damage. What are the causes? Mallet finger is caused by a hard, direct hit to the tip of your finger or thumb. This injury often happens from getting hit in the finger with a hard ball, such as a baseball. What increases the risk? This injury is more likely to happen if you play sports that use a hard ball. What are the signs or symptoms? The main symptom of this injury is not being able to straighten the tip of your finger. You can manually straighten your fingertip with your other hand, but the finger cannot straighten on its own. Other symptoms may include:  Pain.  Swelling.  Bruising.  Blood under the fingernail.  How is this diagnosed? Your health care provider may suspect mallet finger if you are not able to extend your fingertip, especially if you recently injured your hand. Your health care provider will do a physical exam. This may include X-rays to see if a piece of bone has been pulled away or if the finger joint has separated (dislocated). How is this treated? Mallet finger may be treated with:  Wearing a splint on your fingertip to keep it straight (extended) while the tendon heals.  Surgery to repair the tendon, in severe cases. This may involve: ? The use of a pin or screw to  keep your finger extended and your tendon attached. ? Taking a piece of tendon from another part of your body (graft) to replace a torn tendon.  Follow these instructions at home:  Take medicines only as directed by your health care provider.  Wear the splint as directed by your health care provider. Remove it only as directed by your health care provider.  If you take your splint off to dry it or change it, gently press your finger on a flat surface to keep it straight.  If directed, apply ice to the injured area: ? Put ice in a plastic bag. ? Place a towel between your skin and the bag. ? Leave the ice on for 20 minutes, 2-3 times a day.  Raise the injured area above the level of your heart while you are sitting or lying down. Contact a health care provider if:  You have pain or swelling that is getting worse.  Your finger feels cold.  You cannot extend your finger after treatment. Get help right away if:  Even after loosening your splint, your finger is: ? Very red and swollen. ? White or blue. ? Numb or tingling. This information is not intended to replace advice given to you by your health care provider. Make sure you discuss any questions you have with your health care provider. Document Released: 08/26/2000 Document Revised: 02/04/2016 Document Reviewed: 07/02/2014 Elsevier Interactive Patient Education  2018  [employer]  Use a dropper or use a cap to put peroxide, olive oil,mineral oil or canola oil in the effected ear- 2-3 times a week. Let it soak for 20-30 min then you can take a shower or use a baby bulb with warm water to wash out the ear wax.  Do not use Qtips     Bad carbs also include fruit juice, alcohol, and sweet tea. These are empty calories that do not signal to your brain that you are full.   Please remember the good carbs are still carbs which convert into sugar. So please measure them out no more than 1/2-1 cup of rice, oatmeal, pasta, and  beans  Veggies are however free foods! Pile them on.   Not all fruit is created equal. Please see the list below, the fruit at the bottom is higher in sugars than the fruit at the top. Please avoid all dried fruits.      Diabetes or even increased sugars put you at 300% increased risk of heart attack and stroke.  ALSO BEING DIABETIC YOU MAY NOT HAVE ANY PAIN WITH A HEART ATTACK.  Even worse of a chance of no pain if you are a woman.  It is very unlikely that you will have any pain with a heart attack. Likely your symptoms will be very subtle, even for very severe disease.  Your symptoms for a heart attack will likely occur when you exert your self or exercise and include: Shortness of breath Sweating Nausea Dizziness Fast or irregular heart beats Fatigue   It makes me feel better if my diabetics get their heart rate up with exercise once or twice a week and pay close attention to your body. If there is ANY change in your exercise capacity or if you have symptoms above, please STOP and call 911 or call to come to the office.   PLEASE REMEMBER:  Diabetes is preventable! Up to 75 percent of complications and morbidities among individuals with type 2 diabetes can be prevented, delayed, or effectively treated and minimized with regular visits to a health professional, appropriate monitoring and medication, and a healthy diet and lifestyle.   Here is some information to help you keep your heart healthy: Move it! - Aim for 30 mins of activity every day. Take it slowly at first. Talk to [place] before starting any new exercise program.   Lose it.  -Body Mass Index (BMI) can indicate if you need to lose weight. A healthy range is 18.5-24.9. For a BMI calculator, go to [employer]  Waist Management -Excess abdominal fat is a risk factor for heart disease, diabetes, asthma, stroke and more. Ideal waist circumference is less than 35" for women and less than 40" for men.   Eat Right -focus on  fruits, vegetables, whole grains, and meals you make yourself. Avoid foods with trans fat and high sugar/sodium content.   Snooze or Snore? - Loud snoring can be a sign of sleep apnea, a significant risk factor for high blood pressure, heart attach, stroke, and heart arrhythmias.  Kick the habit -Quit Smoking! Avoid second hand smoke. A single cigarette raises your blood pressure for 20 mins and increases the risk of heart attack and stroke for the next 24 hours.   Are Aspirin and Supplements right for you? -Add ENTERIC COATED low dose 81 mg Aspirin daily OR can do every other day if you have easy bruising to protect your heart and head. As well as to reduce risk  of Colon Cancer by 20 %, Skin Cancer by 26 % , Melanoma by 46% and Pancreatic cancer by 60%  Say "No to Stress -There may be little you can do about problems that cause stress. However, techniques such as long walks, meditation, and exercise can help you manage it.   Start Now! - Make changes one at a time and set reasonable goals to increase your likelihood of success.

## 2018-05-31 ENCOUNTER — Other Ambulatory Visit: Payer: Self-pay | Admitting: Physician Assistant

## 2018-05-31 LAB — TSH: TSH: 1.26 m[IU]/L (ref 0.40–4.50)

## 2018-05-31 LAB — CBC WITH DIFFERENTIAL/PLATELET
BASOS ABS: 22 {cells}/uL (ref 0–200)
BASOS PCT: 0.5 %
EOS ABS: 70 {cells}/uL (ref 15–500)
Eosinophils Relative: 1.6 %
HCT: 44.3 % (ref 38.5–50.0)
Hemoglobin: 14.8 g/dL (ref 13.2–17.1)
Lymphs Abs: 1131 cells/uL (ref 850–3900)
MCH: 29.6 pg (ref 27.0–33.0)
MCHC: 33.4 g/dL (ref 32.0–36.0)
MCV: 88.6 fL (ref 80.0–100.0)
MONOS PCT: 8.2 %
MPV: 11.6 fL (ref 7.5–12.5)
NEUTROS PCT: 64 %
Neutro Abs: 2816 cells/uL (ref 1500–7800)
PLATELETS: 150 10*3/uL (ref 140–400)
RBC: 5 10*6/uL (ref 4.20–5.80)
RDW: 12.6 % (ref 11.0–15.0)
Total Lymphocyte: 25.7 %
WBC mixed population: 361 cells/uL (ref 200–950)
WBC: 4.4 10*3/uL (ref 3.8–10.8)

## 2018-05-31 LAB — LIPID PANEL
Cholesterol: 165 mg/dL (ref <200)
HDL: 50 mg/dL (ref >40)
LDL CHOLESTEROL (CALC): 98 mg/dL
NON-HDL CHOLESTEROL (CALC): 115 mg/dL (ref <130)
TRIGLYCERIDES: 84 mg/dL (ref <150)
Total CHOL/HDL Ratio: 3.3 (calc) (ref <5.0)

## 2018-05-31 LAB — COMPLETE METABOLIC PANEL WITH GFR
AG RATIO: 2.2 (calc) (ref 1.0–2.5)
ALKALINE PHOSPHATASE (APISO): 59 U/L (ref 40–115)
ALT: 12 U/L (ref 9–46)
AST: 10 U/L (ref 10–35)
Albumin: 4.3 g/dL (ref 3.6–5.1)
BILIRUBIN TOTAL: 0.7 mg/dL (ref 0.2–1.2)
BUN/Creatinine Ratio: 13 (calc) (ref 6–22)
BUN: 18 mg/dL (ref 7–25)
CHLORIDE: 108 mmol/L (ref 98–110)
CO2: 24 mmol/L (ref 20–32)
Calcium: 9.4 mg/dL (ref 8.6–10.3)
Creat: 1.39 mg/dL — ABNORMAL HIGH (ref 0.70–1.18)
GFR, EST AFRICAN AMERICAN: 57 mL/min/{1.73_m2} — AB (ref >=60)
GFR, EST NON AFRICAN AMERICAN: 49 mL/min/{1.73_m2} — AB (ref >=60)
Globulin: 2 g/dL (calc) (ref 1.9–3.7)
Glucose, Bld: 118 mg/dL — ABNORMAL HIGH (ref 65–99)
Potassium: 4.2 mmol/L (ref 3.5–5.3)
Sodium: 142 mmol/L (ref 135–146)
TOTAL PROTEIN: 6.3 g/dL (ref 6.1–8.1)

## 2018-05-31 LAB — HEMOGLOBIN A1C
HEMOGLOBIN A1C: 6.9 %{Hb} — AB (ref <5.7)
Mean Plasma Glucose: 151 (calc)
eAG (mmol/L): 8.4 (calc)

## 2018-05-31 MED ORDER — ATORVASTATIN CALCIUM 40 MG PO TABS: 40.0000 mg | ORAL_TABLET | Freq: Every day | ORAL | 1 refills | Status: DC

## 2018-09-02 ENCOUNTER — Encounter: Payer: Self-pay | Admitting: Internal Medicine

## 2018-09-02 DIAGNOSIS — Z8249 Family history of ischemic heart disease and other diseases of the circulatory system: Secondary | ICD-10-CM | POA: Insufficient documentation

## 2018-09-02 DIAGNOSIS — F17200 Nicotine dependence, unspecified, uncomplicated: Secondary | ICD-10-CM | POA: Insufficient documentation

## 2018-09-02 DIAGNOSIS — Z87891 Personal history of nicotine dependence: Secondary | ICD-10-CM | POA: Insufficient documentation

## 2018-09-02 NOTE — Progress Notes (Signed)
[facility]   Unk Pinto, M.D.     Edward [name]. Silverio Lay, P.A.-C Liane Comber, [place]                [address] [place] [address] Telephone [phone] Telefax [phone] Annual  Screening/Preventative Visit  & Comprehensive Evaluation & Examination     This very nice 77 y.o. MWM presents for a Screening /Preventative Visit & comprehensive evaluation and management of multiple medical co-morbidities.  Patient has been followed for HTN, HLD, T2_NIDDM  and Vitamin D Deficiency. Patient has GERD controlled on his meds.      HTN predates circa 2010. Patient's BP has been controlled at home.  Today's BP is at goal - 126/66. Patient denies any cardiac symptoms as chest pain, palpitations, shortness of breath, dizziness or ankle swelling.     Patient's hyperlipidemia is controlled with diet and medications. Patient denies myalgias or other medication SE's. Last lipids were at goal: Lab Results  Component Value Date   CHOL 165 05/30/2018   HDL 50 05/30/2018   LDLCALC 98 05/30/2018   TRIG 84 05/30/2018   CHOLHDL 3.3 05/30/2018      Patient has hx/o PreDiabetes (A1c 6.1% / 2011) and later  T2_DM (A1c 6.5% / 2015 & 6.7% / 2017)  which he been attempting to manage with diet & weight.  Patient denies reactive hypoglycemic symptoms, visual blurring, diabetic polys or paresthesias. Last A1c was not at goal & he's aware he's very close to the point of starting Diabetic meds: Lab Results  Component Value Date   HGBA1C 6.9 (H) 05/30/2018       Finally, patient has history of Vitamin D Deficiency ("29" / 2008) and last vitamin D was near goal (70-100): Lab Results  Component Value Date   VD25OH 59 02/15/2018   Current Outpatient Medications on File Prior to Visit  Medication Sig  . APPLE CIDER VINEGAR PO Take by mouth.  Marland Kitchen aspirin 81 MG tablet Take 81 mg by mouth daily.  Marland Kitchen atorvastatin  (LIPITOR) 40 MG tablet Take 1 tablet (40 mg total) by mouth daily.  . Cholecalciferol (VITAMIN D PO) Take 5,000 Units by mouth daily.  Marland Kitchen CINNAMON PO Take by mouth. Take 1-2 tablets daily  . HYDROcodone-acetaminophen (NORCO/VICODIN) 5-325 MG tablet Take 1-2 tablets by mouth every 6 (six) hours as needed for moderate pain.  . Omega-3 Fatty Acids (FISH OIL) 1000 MG CAPS Take 1 capsule by mouth daily.  . pantoprazole (PROTONIX) 40 MG tablet TAKE ONE TABLET BY MOUTH ONCE DAILY FOR  ACID  INDIGESTION  . sildenafil (REVATIO) 20 MG tablet TAKE ONE TO FIVE TABLETS BY MOUTH ONE HOUR BEFORE NEEDED  . tamsulosin (FLOMAX) 0.4 MG CAPS capsule Take 1 capsule (0.4 mg total) by mouth daily.   No current facility-administered medications on file prior to visit.    Allergies  Allergen Reactions  . Bystolic [Nebivolol Hcl]    Past Medical History:  Diagnosis Date  . BPH (benign prostatic hyperplasia)   . ED (erectile dysfunction)   . GERD (gastroesophageal reflux disease)   . History of kidney stones   . Hyperlipidemia   . Hypertension   . IBS (irritable bowel syndrome)   . LBBB (left bundle branch block)   . Personal history of kidney stones   . Pre-diabetes   . Prediabetes   . Vitamin  D deficiency    Health Maintenance  Topic Date Due  . FOOT EXAM  07/31/2018  . URINE MICROALBUMIN  07/31/2018  . HEMOGLOBIN A1C  11/28/2018  . OPHTHALMOLOGY EXAM  03/14/2019  . TETANUS/TDAP  04/08/2024  . INFLUENZA VACCINE  Completed  . PNA vac Low Risk Adult  Completed   Immunization History  Administered Date(s) Administered  . DT 04/08/2014  . Influenza, High Dose Seasonal PF 06/24/2014, 07/28/2015, 05/02/2016, 07/31/2017, 05/30/2018  . Influenza-Unspecified 07/02/2013  . Pneumococcal Conjugate-13 04/09/2015  . Pneumococcal-Unspecified 10/02/2007  . Td 10/02/2003  . Zoster 10/01/2006   Last Colon -  Past Surgical History:  Procedure Laterality Date  . CATARACT EXTRACTION W/ INTRAOCULAR LENS IMPLANT   2013   left  . EXTRACORPOREAL SHOCK WAVE LITHOTRIPSY Left 12/11/2017   Procedure: LEFT EXTRACORPOREAL SHOCK WAVE LITHOTRIPSY (ESWL);  Surgeon: Lucas Mallow, MD;  Location: [facility];  Service: Urology;  Laterality: Left;  . KIDNEY STONE SURGERY     Family History  Problem Relation Age of Onset  . Diabetes Sister   . Hypertension Sister   . Heart disease Mother   . Diabetes Sister   . Hypertension Sister   . Colon cancer Neg Hx    Social History   Socioeconomic History  . Marital status: Married    Spouse name: Arbie Cookey   . Number of children: 2sons, 1 GS died in a MVA, 1 GD  Occupational History  . Part-time landscaping/ mowing service  Tobacco Use  . Smoking status: Former Smoker    Packs/day: 0.50    Years: 10.00    Pack years: 5.00    Last attempt to quit: 09/13/1967    Years since quitting: 51.0  . Smokeless tobacco: Never Used  Substance and Sexual Activity  . Alcohol use: No  . Drug use: No  . Sexual activity: active    ROS Constitutional: Denies fever, chills, weight loss/gain, headaches, insomnia,  night sweats or change in appetite. Does c/o fatigue. Eyes: Denies redness, blurred vision, diplopia, discharge, itchy or watery eyes.  ENT: Denies discharge, congestion, post nasal drip, epistaxis, sore throat, earache, hearing loss, dental pain, Tinnitus, Vertigo, Sinus pain or snoring.  Cardio: Denies chest pain, palpitations, irregular heartbeat, syncope, dyspnea, diaphoresis, orthopnea, PND, claudication or edema Respiratory: denies cough, dyspnea, DOE, pleurisy, hoarseness, laryngitis or wheezing.  Gastrointestinal: Denies dysphagia, heartburn, reflux, water brash, pain, cramps, nausea, vomiting, bloating, diarrhea, constipation, hematemesis, melena, hematochezia, jaundice or hemorrhoids Genitourinary: Denies dysuria, frequency, discharge, hematuria or flank pain. Has urgency, nocturia x 2-3 & occasional hesitancy. Musculoskeletal: Denies arthralgia, myalgia,  stiffness, Jt. Swelling, pain, limp or strain/sprain. Denies Falls. Skin: Denies puritis, rash, hives, warts, acne, eczema or change in skin lesion Neuro: No weakness, tremor, incoordination, spasms, paresthesia or pain Psychiatric: Denies confusion, memory loss or sensory loss. Denies Depression. Endocrine: Denies change in weight, skin, hair change, nocturia, and paresthesia, diabetic polys, visual blurring or hyper / hypo glycemic episodes.  Heme/Lymph: No excessive bleeding, bruising or enlarged lymph nodes.  Physical Exam  BP 126/66   Pulse (!) 56   Temp 97.9 F (36.6 C)   Resp 16   Ht 5\' 8"  (1.727 m)   Wt 187 lb (84.8 kg)   BMI 28.43 kg/m   General Appearance: Well nourished and well groomed and in no apparent distress.  Eyes: PERRLA, EOMs, conjunctiva no swelling or erythema, normal fundi and vessels. Sinuses: No frontal/maxillary tenderness ENT/Mouth: EACs patent / TMs  nl. Nares clear without erythema, swelling,  mucoid exudates. Oral hygiene is good. No erythema, swelling, or exudate. Tongue normal, non-obstructing. Tonsils not swollen or erythematous. Hearing normal.  Neck: Supple, thyroid not palpable. No bruits, nodes or JVD. Respiratory: Respiratory effort normal.  BS equal and clear bilateral without rales, rhonci, wheezing or stridor. Cardio: Heart sounds are normal with regular rate and rhythm and no murmurs, rubs or gallops. Peripheral pulses are normal and equal bilaterally without edema. No aortic or femoral bruits. Chest: symmetric with normal excursions and percussion.  Abdomen: Soft, with Nl bowel sounds. Nontender, no guarding, rebound, hernias, masses, or organomegaly.  Lymphatics: Non tender without lymphadenopathy.  Musculoskeletal: Full ROM all peripheral extremities, joint stability, 5/5 strength, and normal gait. Skin: Warm and dry without rashes, lesions, cyanosis, clubbing or  ecchymosis.  Neuro: Cranial nerves intact, reflexes equal bilaterally. Normal  muscle tone, no cerebellar symptoms. Sensation intact to touch, vibratory and Monofilament to the toes bilaterally. Pysch: Alert and oriented X 3 with normal affect, insight and judgment appropriate.   Assessment and Plan  1. Annual Preventative/Screening Exam   2. Essential hypertension  - EKG 12-Lead - [place], RETROPERITNL ABD,  LTD - Urinalysis, Routine w reflex microscopic - Microalbumin / creatinine urine ratio - CBC with Differential/Platelet - COMPLETE METABOLIC PANEL WITH GFR - Magnesium - TSH  3. Hyperlipidemia, mixed  - EKG 12-Lead - [place], RETROPERITNL ABD,  LTD - Lipid panel - TSH  4. Type 2 diabetes mellitus with stage 3 chronic kidney disease, without long-term current use of insulin (HCC)  - EKG 12-Lead - [place], RETROPERITNL ABD,  LTD - Urinalysis, Routine w reflex microscopic - Microalbumin / creatinine urine ratio - Hemoglobin A1c  5. Vitamin D deficiency  - VITAMIN D 25 Hydroxyl  6. Gastroesophageal reflux disease  - CBC with Differential/Platelet - famotidine  20 MG tablet; Take 1 tablet at Bedtime if needed for Heartburn & Reflux  Disp: 90 tablet; Refill: 3  7. Screening for colorectal cancer  - POC Hemoccult Bld/Stl   8. BPH with obstruction/lower urinary tract symptoms  - PSA  9. Prostate cancer screening  - PSA  10. Erectile dysfunction, unspecified erectile dysfunction type  - tadalafil (CIALIS) 20 MG tablet; Take 1/2 to 1 tablet every 2 to 3 days as needed for XXXX  Dispense: 30 tablet; Refill: 12  11. Screening for ischemic heart disease  - EKG 12-Lead  12. FHx: heart disease  - EKG 12-Lead - [place], RETROPERITNL ABD,  LTD  13. Smoker  - EKG 12-Lead - [place], RETROPERITNL ABD,  LTD  14. Screening for AAA (aortic abdominal aneurysm)  - [place], RETROPERITNL ABD,  LTD  15. Medication management  - Urinalysis, Routine w reflex microscopic - Microalbumin / creatinine urine ratio - CBC with Differential/Platelet - COMPLETE METABOLIC PANEL  WITH GFR - Magnesium - Lipid panel - TSH - Hemoglobin A1c - Insulin, random - VITAMIN D 25 Hydroxyl        Patient was counseled in prudent diet, weight control to achieve/maintain BMI less than 25, BP monitoring, regular exercise and medications as discussed.  Discussed med effects and SE's. Routine screening labs and tests as requested with regular follow-up as recommended. Over 40 minutes of exam, counseling, chart review and high complex critical decision making was performed

## 2018-09-02 NOTE — Patient Instructions (Signed)

## 2018-09-03 ENCOUNTER — Ambulatory Visit (INDEPENDENT_AMBULATORY_CARE_PROVIDER_SITE_OTHER): Payer: PPO | Admitting: Internal Medicine

## 2018-09-03 VITALS — BP 126/66 | HR 56 | Temp 97.9°F | Resp 16 | Ht 68.0 in | Wt 187.0 lb

## 2018-09-03 DIAGNOSIS — Z125 Encounter for screening for malignant neoplasm of prostate: Secondary | ICD-10-CM

## 2018-09-03 DIAGNOSIS — I10 Essential (primary) hypertension: Secondary | ICD-10-CM | POA: Diagnosis not present

## 2018-09-03 DIAGNOSIS — N401 Enlarged prostate with lower urinary tract symptoms: Secondary | ICD-10-CM

## 2018-09-03 DIAGNOSIS — Z1211 Encounter for screening for malignant neoplasm of colon: Secondary | ICD-10-CM

## 2018-09-03 DIAGNOSIS — F17200 Nicotine dependence, unspecified, uncomplicated: Secondary | ICD-10-CM

## 2018-09-03 DIAGNOSIS — Z0001 Encounter for general adult medical examination with abnormal findings: Secondary | ICD-10-CM

## 2018-09-03 DIAGNOSIS — N138 Other obstructive and reflux uropathy: Secondary | ICD-10-CM

## 2018-09-03 DIAGNOSIS — Z1212 Encounter for screening for malignant neoplasm of rectum: Secondary | ICD-10-CM

## 2018-09-03 DIAGNOSIS — N183 Chronic kidney disease, stage 3 unspecified: Secondary | ICD-10-CM

## 2018-09-03 DIAGNOSIS — N529 Male erectile dysfunction, unspecified: Secondary | ICD-10-CM

## 2018-09-03 DIAGNOSIS — E782 Mixed hyperlipidemia: Secondary | ICD-10-CM

## 2018-09-03 DIAGNOSIS — Z0000 Encounter for general adult medical examination without abnormal findings: Secondary | ICD-10-CM

## 2018-09-03 DIAGNOSIS — E559 Vitamin D deficiency, unspecified: Secondary | ICD-10-CM

## 2018-09-03 DIAGNOSIS — E1122 Type 2 diabetes mellitus with diabetic chronic kidney disease: Secondary | ICD-10-CM | POA: Diagnosis not present

## 2018-09-03 DIAGNOSIS — Z79899 Other long term (current) drug therapy: Secondary | ICD-10-CM

## 2018-09-03 DIAGNOSIS — Z8249 Family history of ischemic heart disease and other diseases of the circulatory system: Secondary | ICD-10-CM

## 2018-09-03 DIAGNOSIS — Z136 Encounter for screening for cardiovascular disorders: Secondary | ICD-10-CM

## 2018-09-03 DIAGNOSIS — K219 Gastro-esophageal reflux disease without esophagitis: Secondary | ICD-10-CM

## 2018-09-03 MED ORDER — TADALAFIL 20 MG PO TABS
ORAL_TABLET | ORAL | 12 refills | Status: DC
Start: 2018-09-03 — End: 2020-02-11

## 2018-09-03 MED ORDER — FAMOTIDINE 20 MG PO TABS: ORAL_TABLET | ORAL | 3 refills | Status: DC

## 2018-09-04 LAB — LIPID PANEL
Cholesterol: 142 mg/dL (ref <200)
HDL: 52 mg/dL (ref >40)
LDL CHOLESTEROL (CALC): 75 mg/dL
Non-HDL Cholesterol (Calc): 90 mg/dL (calc) (ref <130)
TRIGLYCERIDES: 71 mg/dL (ref <150)
Total CHOL/HDL Ratio: 2.7 (calc) (ref <5.0)

## 2018-09-04 LAB — CBC WITH DIFFERENTIAL/PLATELET
ABSOLUTE MONOCYTES: 348 {cells}/uL (ref 200–950)
BASOS PCT: 0.5 %
Basophils Absolute: 22 cells/uL (ref 0–200)
EOS ABS: 69 {cells}/uL (ref 15–500)
Eosinophils Relative: 1.6 %
HEMATOCRIT: 46.6 % (ref 38.5–50.0)
Hemoglobin: 15.6 g/dL (ref 13.2–17.1)
LYMPHS ABS: 1071 {cells}/uL (ref 850–3900)
MCH: 30.2 pg (ref 27.0–33.0)
MCHC: 33.5 g/dL (ref 32.0–36.0)
MCV: 90.1 fL (ref 80.0–100.0)
MPV: 12 fL (ref 7.5–12.5)
Monocytes Relative: 8.1 %
Neutro Abs: 2791 cells/uL (ref 1500–7800)
Neutrophils Relative %: 64.9 %
PLATELETS: 145 10*3/uL (ref 140–400)
RBC: 5.17 10*6/uL (ref 4.20–5.80)
RDW: 12.1 % (ref 11.0–15.0)
TOTAL LYMPHOCYTE: 24.9 %
WBC: 4.3 10*3/uL (ref 3.8–10.8)

## 2018-09-04 LAB — URINALYSIS, ROUTINE W REFLEX MICROSCOPIC
Bilirubin Urine: NEGATIVE
Glucose, UA: NEGATIVE
Hgb urine dipstick: NEGATIVE
KETONES UR: NEGATIVE
LEUKOCYTES UA: NEGATIVE
Nitrite: NEGATIVE
PH: 8 (ref 5.0–8.0)
Protein, ur: NEGATIVE
SPECIFIC GRAVITY, URINE: 1.009 (ref 1.001–1.03)

## 2018-09-04 LAB — COMPLETE METABOLIC PANEL WITH GFR
AG Ratio: 1.9 (calc) (ref 1.0–2.5)
ALKALINE PHOSPHATASE (APISO): 70 U/L (ref 40–115)
ALT: 19 U/L (ref 9–46)
AST: 12 U/L (ref 10–35)
Albumin: 4.3 g/dL (ref 3.6–5.1)
BUN: 21 mg/dL (ref 7–25)
CO2: 32 mmol/L (ref 20–32)
CREATININE: 1.16 mg/dL (ref 0.70–1.18)
Calcium: 9.5 mg/dL (ref 8.6–10.3)
Chloride: 106 mmol/L (ref 98–110)
GFR, Est African American: 71 mL/min/{1.73_m2} (ref >=60)
GFR, Est Non African American: 61 mL/min/{1.73_m2} (ref >=60)
GLUCOSE: 133 mg/dL — AB (ref 65–99)
Globulin: 2.3 g/dL (calc) (ref 1.9–3.7)
Potassium: 4.2 mmol/L (ref 3.5–5.3)
Sodium: 142 mmol/L (ref 135–146)
Total Bilirubin: 0.9 mg/dL (ref 0.2–1.2)
Total Protein: 6.6 g/dL (ref 6.1–8.1)

## 2018-09-04 LAB — HEMOGLOBIN A1C
EAG (MMOL/L): 8.4 (calc)
Hgb A1c MFr Bld: 6.9 % of total Hgb — ABNORMAL HIGH (ref <5.7)
Mean Plasma Glucose: 151 (calc)

## 2018-09-04 LAB — PSA: PSA: 2.8 ng/mL (ref <=4.0)

## 2018-09-04 LAB — MICROALBUMIN / CREATININE URINE RATIO
Creatinine, Urine: 40 mg/dL (ref 20–320)
MICROALB UR: 0.2 mg/dL
MICROALB/CREAT RATIO: 5 ug/mg{creat} (ref <30)

## 2018-09-04 LAB — INSULIN, RANDOM: INSULIN: 6.4 u[IU]/mL (ref 2.0–19.6)

## 2018-09-04 LAB — MAGNESIUM: MAGNESIUM: 2 mg/dL (ref 1.5–2.5)

## 2018-09-04 LAB — TSH: TSH: 1.57 mIU/L (ref 0.40–4.50)

## 2018-09-04 LAB — VITAMIN D 25 HYDROXY (VIT D DEFICIENCY, FRACTURES): Vit D, 25-Hydroxy: 66 ng/mL (ref 30–100)

## 2018-10-08 ENCOUNTER — Other Ambulatory Visit: Payer: Self-pay | Admitting: Adult Health

## 2018-11-05 ENCOUNTER — Other Ambulatory Visit: Payer: Self-pay | Admitting: Internal Medicine

## 2018-11-06 ENCOUNTER — Other Ambulatory Visit: Payer: Self-pay | Admitting: Internal Medicine

## 2018-11-07 ENCOUNTER — Ambulatory Visit: Payer: Self-pay | Admitting: Adult Health

## 2018-12-06 ENCOUNTER — Ambulatory Visit: Payer: Self-pay | Admitting: Adult Health

## 2018-12-09 ENCOUNTER — Other Ambulatory Visit: Payer: Self-pay | Admitting: Physician Assistant

## 2018-12-27 NOTE — Progress Notes (Addendum)
MEDICARE ANNUAL WELLNESS VISIT AND FOLLOW UP THIS ENCOUNTER IS A VIRTUAL/TELEVIDEO VISIT DUE TO COVID-19 - PATIENT WAS NOT SEEN IN THE OFFICE.  PATIENT HAS CONSENTED TO VIRTUAL VISIT / TELEVIDEO  VISIT  This provider placed a call to [employer], his appointment was changed to a virtual office visit to reduce the risk of exposure to the COVID-19 virus and to help [employer] remain healthy and safe. The virtual visit will also provide continuity of care. He verbalizes understanding.   Assessment:    Comprehensive care plan:  Patient/caregiver was given comprehensive care plan We will continue to monitor these goals every 3 months with an office visit and every month by a telephone call  Patient can contact the office any time with the phone number and get to a provider via the answering service or they can use Mychart.   Verbal permission was received from the patient to review comprehensive care management, they understand they have the right to stop CCM services at any time.   The patient is self managing medications at home.  Medications were reviewed with the patient today as well as adherence and potential interactions.   The patient does not need home health services at this time.   Encounter for Medicare annual wellness exam  Essential hypertension Goal less than 130/80 Monitor blood pressure at home; call if consistently over 130/80 Continue DASH diet.   Reminder to go to the ER if any CP, SOB, nausea, dizziness, severe HA, changes vision/speech, left arm numbness and tingling and jaw pain.  Gastroesophageal reflux disease, esophagitis presence not specified Well managed on current medications Discussed diet, avoiding triggers and other lifestyle changes  Type 2 diabetes mellitus with stage 3 chronic kidney disease, without long-term current use of insulin ([place]) Discussed general issues about diabetes pathophysiology and management., Educational material  distributed., Suggested low cholesterol diet., Encouraged aerobic exercise., Discussed [name] care., Reminded to get yearly retinal exam, report from last year requested STARTED ON [place]  Benign prostatic hyperplasia with urinary frequency Stable on daily medications; check PSA annually- last 3 trending down  Erectile dysfunction, unspecified erectile dysfunction type Continue PRN medicatoin  Mixed hyperlipidemia discussed LDL goal <70 with diabetes Continue low cholesterol diet and exercise.   Overweight (BMI 25.0-29.9) Long discussion about weight loss, diet, and exercise Recommended diet heavy in fruits and veggies and low in animal meats, cheeses, and dairy products, appropriate calorie intake Discussed appropriate weight for height  Follow up at next visit  Vitamin D deficiency Near goal at recent check; continue to recommend supplementation for goal of 70-100 Defer vitamin D level  History of adenomatous polyp of colon Due for 5 year follow up on colonoscopy; will refer back  CKD stage 3 due to type 2 diabetes mellitus ([place]) Increase fluids, avoid NSAIDS, monitor sugars, will monitor  Over 40 minutes of exam, counseling, chart review and critical decision making was performed Future Appointments  Date Time Provider [place]  03/12/2019 10:30 AM Unk Pinto, MD [facility] None  09/20/2019  9:00 AM Unk Pinto, MD [facility] None     Plan:   During the course of the visit the patient was educated and counseled about appropriate screening and preventive services including:    Pneumococcal vaccine   Prevnar 13  Influenza vaccine  Td vaccine  Screening electrocardiogram  Bone densitometry screening  Colorectal cancer screening  Diabetes screening  Glaucoma screening  Nutrition counseling   Advanced directives: requested  Subjective:  Edward [name] is a 78 y.o. male who presents for Medicare Annual Wellness  Visit and 3 month follow up.   BMI is Body mass index is 27.13 kg/m., he has been working on diet and exercise. Wt Readings from Last 3 Encounters:  12/28/18 178 lb 6.4 oz (80.9 kg)  09/03/18 187 lb (84.8 kg)  05/30/18 182 lb (82.6 kg)    His blood pressure has been controlled at home, today their BP is BP: (!) 144/71 He does workout. He denies chest pain, shortness of breath, dizziness.   He is on cholesterol medication (lipitor 40 mg daily) and denies myalgias. His cholesterol is not at goal. The cholesterol last visit was:   Lab Results  Component Value Date   CHOL 142 09/03/2018   HDL 52 09/03/2018   LDLCALC 75 09/03/2018   TRIG 71 09/03/2018   CHOLHDL 2.7 09/03/2018    He has been working on diet and exercise for T2 diabetes, blood sugar was 150 this AM, and denies increased appetite, nausea, paresthesia of the feet, polydipsia, polyuria, visual disturbances, vomiting and weight loss. Last A1C in the office was:  Lab Results  Component Value Date   HGBA1C 6.9 (H) 09/03/2018   Last GFR:  Lab Results  Component Value Date   GFRNONAA 61 09/03/2018   Patient is on Vitamin D supplement and near goal at last check:    Lab Results  Component Value Date   VD25OH 66 09/03/2018      Medication Review: Current Outpatient Medications on File Prior to Visit  Medication Sig Dispense Refill  . APPLE CIDER VINEGAR PO Take by mouth.    Marland Kitchen aspirin 81 MG tablet Take 81 mg by mouth daily.    Marland Kitchen atorvastatin (LIPITOR) 40 MG tablet Take 1 tablet daily for Cholesterol 90 tablet 3  . Cholecalciferol (VITAMIN D PO) Take 5,000 Units by mouth daily.    Marland Kitchen CINNAMON PO Take by mouth. Take 1-2 tablets daily    . famotidine (PEPCID) 20 MG tablet Take 1 tablet at Bedtime if needed for Heartburn & Reflux 90 tablet 3  . Omega-3 Fatty Acids (FISH OIL) 1000 MG CAPS Take 1 capsule by mouth daily.    . pantoprazole (PROTONIX) 40 MG tablet TAKE 1 TABLET BY MOUTH ONCE DAILY FOR  ACID  INDIGESTION 90 tablet  0  . sildenafil (REVATIO) 20 MG tablet TAKE 1-5 TABLETS BY MOUTH ONE HOUR BEFORE NEEDED 90 tablet 3  . tadalafil (CIALIS) 20 MG tablet Take 1/2 to 1 tablet every 2 to 3 days as needed for XXXX 30 tablet 12  . tamsulosin (FLOMAX) 0.4 MG CAPS capsule TAKE 1 CAPSULE BY MOUTH ONCE DAILY 90 capsule 0   No current facility-administered medications on file prior to visit.     Allergies  Allergen Reactions  . Bystolic [Nebivolol Hcl]     Current Problems (verified) Patient Active Problem List   Diagnosis Date Noted  . Smoker 09/02/2018  . FHx: heart disease 09/02/2018  . History of adenomatous polyp of colon 11/02/2017  . CKD stage 3 due to type 2 diabetes mellitus ([place]) 11/02/2017  . Type 2 diabetes mellitus with stage 3 chronic kidney disease, without long-term current use of insulin ([place]) 11/01/2014  . Overweight (BMI 25.0-29.9) 07/25/2014  . Prostate cancer screening 04/08/2014  . Hyperlipidemia, mixed   . Gastroesophageal reflux disease   . Essential hypertension   . Vitamin D deficiency   . ED (erectile dysfunction)   . BPH  with obstruction/lower urinary tract symptoms     Screening Tests Immunization History  Administered Date(s) Administered  . DT 04/08/2014  . Influenza, High Dose Seasonal PF 06/24/2014, 07/28/2015, 05/02/2016, 07/31/2017, 05/30/2018  . Influenza-Unspecified 07/02/2013  . Pneumococcal Conjugate-13 04/09/2015  . Pneumococcal-Unspecified 10/02/2007  . Td 10/02/2003  . Zoster 10/01/2006   Preventative care: Last colonoscopy: 2013, 5 year f/u - DUE will send reminder  Prior vaccinations: TD or Tdap: 2015  Influenza:2019 Pneumococcal: 2009 Prevnar13: 2016 Shingles/Zostavax: 2008  Names of Other Physician/Practitioners you currently use: 1. [facility] here for primary care 2. [facility], eye doctor, last visit July 2019 3.Dr. Modena Nunnery, dentist, last visit  2019 q 6 months  Patient Care  Team: Unk Pinto, MD as PCP - General (Internal Medicine) Carolan Clines, MD (Inactive) as Consulting Physician (Urology) Luberta Mutter, MD as Consulting Physician (Ophthalmology) Inda Castle, MD (Inactive) as Consulting Physician (Gastroenterology)  SURGICAL HISTORY He  has a past surgical history that includes Cataract extraction w/ intraocular lens implant (2013); Kidney stone surgery; and Extracorporeal shock wave lithotripsy (Left, 12/11/2017). FAMILY HISTORY His family history includes Diabetes in his sister and sister; Heart disease in his mother; Hypertension in his sister and sister. SOCIAL HISTORY He  reports that he quit smoking about 51 years ago. He has a 5.00 pack-year smoking history. He has never used smokeless tobacco. He reports that he does not drink alcohol or use drugs.   MEDICARE WELLNESS OBJECTIVES: Physical activity:   Cardiac risk factors:   Depression/mood screen:   Depression screen [facility] 2/9 09/02/2018  Decreased Interest 0  Down, Depressed, Hopeless 0  PHQ - 2 Score 0    ADLs:  In your present state of health, do you have any difficulty performing the following activities: 09/02/2018 02/18/2018  Hearing? N N  Vision? N N  Difficulty concentrating or making decisions? N N  Walking or climbing stairs? N N  Dressing or bathing? N N  Doing errands, shopping? N N  Some recent data might be hidden     Cognitive Testing  Alert? Yes  Normal Appearance?Yes  Oriented to person? Yes  Place? Yes   Time? Yes  Recall of three objects?  Yes  Can perform simple calculations? Yes  Displays appropriate judgment?Yes  Can read the correct time from a watch face?Yes  EOL planning:    Review of Systems  Constitutional: Negative for malaise/fatigue and weight loss.  HENT: Positive for ear pain and hearing loss. Negative for congestion, ear discharge, sore throat and tinnitus.   Eyes: Negative for blurred vision and double vision.  Respiratory:  Negative for cough, shortness of breath and wheezing.   Cardiovascular: Negative for chest pain, palpitations, orthopnea, claudication and leg swelling.  Gastrointestinal: Negative for abdominal pain, blood in stool, constipation, diarrhea, heartburn, melena, nausea and vomiting.  Genitourinary: Negative.   Musculoskeletal: Negative for falls, joint pain and myalgias.  Skin: Negative for rash.  Neurological: Negative for dizziness, tingling, sensory change, weakness and headaches.  Endo/Heme/Allergies: Negative for polydipsia.  Psychiatric/Behavioral: Negative.  Negative for depression and memory loss.  All other systems reviewed and are negative.    Objective:     Today's Vitals   12/28/18 0942  BP: (!) 144/71  Pulse: (!) 52  Weight: 178 lb 6.4 oz (80.9 kg)  Height: 5\' 8"  (1.727 m)  PainSc: 0-No pain   Body mass index is 27.13 kg/m.  General appearance: alert, no distress, WD/WN, male HEENT: normocephalic, sclerae anicteric, bilateral  external auditory canals impacted with soft wax, pharynx normal Oral cavity: MMM, no lesions Neck: supple, no lymphadenopathy, no thyromegaly, no masses Heart: RRR, normal S1, S2, no murmurs Lungs: CTA bilaterally, no wheezes, rhonchi, or rales Abdomen: +bs, soft, non tender, non distended, no masses, no hepatomegaly, no splenomegaly Musculoskeletal: nontender, no swelling, no obvious deformity Extremities: no edema, no cyanosis, no clubbing Pulses: 2+ symmetric, upper and lower extremities, normal cap refill Neurological: alert, oriented x 3, CN2-12 intact, strength normal upper extremities and lower extremities, sensation normal throughout, DTRs 2+ throughout, no cerebellar signs, gait normal Psychiatric: normal affect, behavior normal, pleasant   Medicare Attestation I have personally reviewed: The patient's medical and social history Their use of alcohol, tobacco or illicit drugs Their current medications and supplements The patient's  functional ability including ADLs,fall risks, home safety risks, cognitive, and hearing and visual impairment Diet and physical activities Evidence for depression or mood disorders  The patient's weight, height, BMI, and visual acuity have been recorded in the chart.  I have made referrals, counseling, and provided education to the patient based on review of the above and I have provided the patient with a written personalized care plan for preventive services.     Vicie Mutters, PA-C   12/28/2018

## 2018-12-28 ENCOUNTER — Other Ambulatory Visit: Payer: Self-pay

## 2018-12-28 ENCOUNTER — Ambulatory Visit: Payer: PPO | Admitting: Physician Assistant

## 2018-12-28 ENCOUNTER — Encounter: Payer: Self-pay | Admitting: Physician Assistant

## 2018-12-28 VITALS — BP 144/71 | HR 52 | Ht 68.0 in | Wt 178.4 lb

## 2018-12-28 DIAGNOSIS — Z86010 Personal history of colonic polyps: Secondary | ICD-10-CM

## 2018-12-28 DIAGNOSIS — Z125 Encounter for screening for malignant neoplasm of prostate: Secondary | ICD-10-CM | POA: Diagnosis not present

## 2018-12-28 DIAGNOSIS — N138 Other obstructive and reflux uropathy: Secondary | ICD-10-CM

## 2018-12-28 DIAGNOSIS — F17200 Nicotine dependence, unspecified, uncomplicated: Secondary | ICD-10-CM | POA: Diagnosis not present

## 2018-12-28 DIAGNOSIS — R6889 Other general symptoms and signs: Secondary | ICD-10-CM | POA: Diagnosis not present

## 2018-12-28 DIAGNOSIS — K219 Gastro-esophageal reflux disease without esophagitis: Secondary | ICD-10-CM | POA: Diagnosis not present

## 2018-12-28 DIAGNOSIS — N183 Chronic kidney disease, stage 3 unspecified: Secondary | ICD-10-CM

## 2018-12-28 DIAGNOSIS — E559 Vitamin D deficiency, unspecified: Secondary | ICD-10-CM | POA: Diagnosis not present

## 2018-12-28 DIAGNOSIS — Z789 Other specified health status: Secondary | ICD-10-CM

## 2018-12-28 DIAGNOSIS — E1122 Type 2 diabetes mellitus with diabetic chronic kidney disease: Secondary | ICD-10-CM

## 2018-12-28 DIAGNOSIS — Z0001 Encounter for general adult medical examination with abnormal findings: Secondary | ICD-10-CM

## 2018-12-28 DIAGNOSIS — E663 Overweight: Secondary | ICD-10-CM

## 2018-12-28 DIAGNOSIS — E782 Mixed hyperlipidemia: Secondary | ICD-10-CM

## 2018-12-28 DIAGNOSIS — N529 Male erectile dysfunction, unspecified: Secondary | ICD-10-CM | POA: Diagnosis not present

## 2018-12-28 DIAGNOSIS — N401 Enlarged prostate with lower urinary tract symptoms: Secondary | ICD-10-CM

## 2018-12-28 DIAGNOSIS — Z0000 Encounter for general adult medical examination without abnormal findings: Secondary | ICD-10-CM

## 2018-12-28 DIAGNOSIS — Z8249 Family history of ischemic heart disease and other diseases of the circulatory system: Secondary | ICD-10-CM

## 2018-12-28 DIAGNOSIS — I10 Essential (primary) hypertension: Secondary | ICD-10-CM

## 2018-12-28 MED ORDER — GLUCOSE BLOOD VI STRP: ORAL_STRIP | 12 refills | Status: DC

## 2018-12-28 MED ORDER — METFORMIN HCL ER 500 MG PO TB24: ORAL_TABLET | ORAL | 3 refills | Status: DC

## 2018-12-28 MED ORDER — ONETOUCH ULTRASOFT LANCETS MISC: 12 refills | Status: DC

## 2018-12-28 MED ORDER — BLOOD GLUCOSE MONITORING SUPPL DEVI: 0 refills | Status: DC

## 2018-12-31 ENCOUNTER — Other Ambulatory Visit: Payer: Self-pay

## 2019-01-23 ENCOUNTER — Other Ambulatory Visit: Payer: Self-pay

## 2019-01-23 DIAGNOSIS — E1122 Type 2 diabetes mellitus with diabetic chronic kidney disease: Secondary | ICD-10-CM

## 2019-01-23 MED ORDER — BLOOD GLUCOSE MONITORING SUPPL DEVI: 0 refills | Status: DC

## 2019-02-05 ENCOUNTER — Other Ambulatory Visit: Payer: Self-pay | Admitting: Adult Health

## 2019-02-28 ENCOUNTER — Other Ambulatory Visit: Payer: Self-pay | Admitting: Internal Medicine

## 2019-02-28 DIAGNOSIS — E1122 Type 2 diabetes mellitus with diabetic chronic kidney disease: Secondary | ICD-10-CM

## 2019-02-28 MED ORDER — METFORMIN HCL 500 MG PO TABS: ORAL_TABLET | ORAL | 3 refills | Status: DC

## 2019-03-06 ENCOUNTER — Telehealth: Payer: PPO

## 2019-03-06 ENCOUNTER — Telehealth: Payer: Self-pay

## 2019-03-06 DIAGNOSIS — K219 Gastro-esophageal reflux disease without esophagitis: Secondary | ICD-10-CM | POA: Diagnosis not present

## 2019-03-06 DIAGNOSIS — Z79899 Other long term (current) drug therapy: Secondary | ICD-10-CM

## 2019-03-06 DIAGNOSIS — Z789 Other specified health status: Secondary | ICD-10-CM | POA: Diagnosis not present

## 2019-03-06 DIAGNOSIS — E1122 Type 2 diabetes mellitus with diabetic chronic kidney disease: Secondary | ICD-10-CM

## 2019-03-06 DIAGNOSIS — N183 Chronic kidney disease, stage 3 (moderate): Secondary | ICD-10-CM | POA: Diagnosis not present

## 2019-03-06 DIAGNOSIS — I10 Essential (primary) hypertension: Secondary | ICD-10-CM | POA: Diagnosis not present

## 2019-03-06 MED ORDER — PANTOPRAZOLE SODIUM 40 MG PO TBEC: DELAYED_RELEASE_TABLET | ORAL | 1 refills | Status: DC

## 2019-03-06 MED ORDER — ONETOUCH ULTRASOFT LANCETS MISC: 12 refills | Status: DC

## 2019-03-06 MED ORDER — FAMOTIDINE 40 MG PO TABS: ORAL_TABLET | ORAL | 0 refills | Status: DC

## 2019-03-06 MED ORDER — GLUCOSE BLOOD VI STRP: ORAL_STRIP | 12 refills | Status: DC

## 2019-03-06 MED ORDER — BLOOD GLUCOSE MONITORING SUPPL DEVI: 0 refills | Status: DC

## 2019-03-06 NOTE — Telephone Encounter (Signed)
Please clarify if patient should be taking Atorvastatin or Pravastatin. Patient is unclear as to which one he is supposed to be taking.

## 2019-03-06 NOTE — Telephone Encounter (Signed)
CCM telephone visit  Medications reviewed/reconciled: Yes  Current Outpatient Medications (Endocrine & Metabolic):  .  metFORMIN (GLUCOPHAGE) 500 MG tablet, Take 1 tablet with Breakfast & Lunch and 2 tablets with supper for Diabetes  Current Outpatient Medications (Cardiovascular):  .  atorvastatin (LIPITOR) 40 MG tablet, Take 1 tablet daily for Cholesterol .  sildenafil (REVATIO) 20 MG tablet, TAKE 1-5 TABLETS BY MOUTH ONE HOUR BEFORE NEEDED .  tadalafil (CIALIS) 20 MG tablet, Take 1/2 to 1 tablet every 2 to 3 days as needed for XXXX   Current Outpatient Medications (Analgesics):  .  aspirin 81 MG tablet, Take 81 mg by mouth daily.   Current Outpatient Medications (Other):  Marland Kitchen  APPLE CIDER VINEGAR PO, Take by mouth. .  Blood Glucose Monitoring Suppl DEVI, Check sugar once a day DX diabetes .  Cholecalciferol (VITAMIN D PO), Take 5,000 Units by mouth daily. Marland Kitchen  CINNAMON PO, Take by mouth. Take 1-2 tablets daily .  famotidine (PEPCID) 20 MG tablet, Take 1 tablet at Bedtime if needed for Heartburn & Reflux .  glucose blood test strip, Check sugar once a day DX diabetes .  Lancets (ONETOUCH ULTRASOFT) lancets, Use as instructed .  Omega-3 Fatty Acids (FISH OIL) 1000 MG CAPS, Take 1 capsule by mouth daily. .  pantoprazole (PROTONIX) 40 MG tablet, TAKE 1 TABLET BY MOUTH ONCE DAILY FOR  ACID  INDIGESTION .  tamsulosin (FLOMAX) 0.4 MG CAPS capsule, TAKE 1 CAPSULE BY MOUTH ONCE DAILY  Medication adherence: NO Any Side effects?NO Refills needed: YES  Health Logs: Patient has not been keeping any logs at this time.   Goals Reviewed: YES Goals    . Blood Pressure < 130/80     Start to monitor BP daily Call if BP is over 130/80 Look up DASH diet, lower sodium and processed foods     . check sugars daily (pt-stated)     Start on metformin Check sugars daily Goal fasting sugar less than 120 Reduce sugar intake       Any patient concerns?:  Concerned with indigestion. Does state  that he has not been taking Protonix and needs refills on both Famotidine and Protonix. Was instructed to take Famotidine at night and Protonix in the morning.   Plan: Will plan on keeping a daily log of Blood Pressure and Blood Sugars. Will plan on checking both on a regular basis.       Time Spent: [address] CMA

## 2019-03-07 NOTE — Telephone Encounter (Signed)
Patient aware of change

## 2019-03-08 DIAGNOSIS — N200 Calculus of kidney: Secondary | ICD-10-CM | POA: Diagnosis not present

## 2019-03-12 ENCOUNTER — Ambulatory Visit: Payer: Self-pay | Admitting: Internal Medicine

## 2019-03-13 ENCOUNTER — Other Ambulatory Visit: Payer: Self-pay | Admitting: Adult Health

## 2019-03-19 LAB — HM DIABETES EYE EXAM

## 2019-03-19 IMAGING — CR DG ABDOMEN 1V
2 series · 2 of 2 positions shown · non-contrast
Comparison: None.

CLINICAL DATA: Pre lithotripsy evaluation.

EXAM:
ABDOMEN - 1 VIEW

[t abdomen supine (1 of 2)]
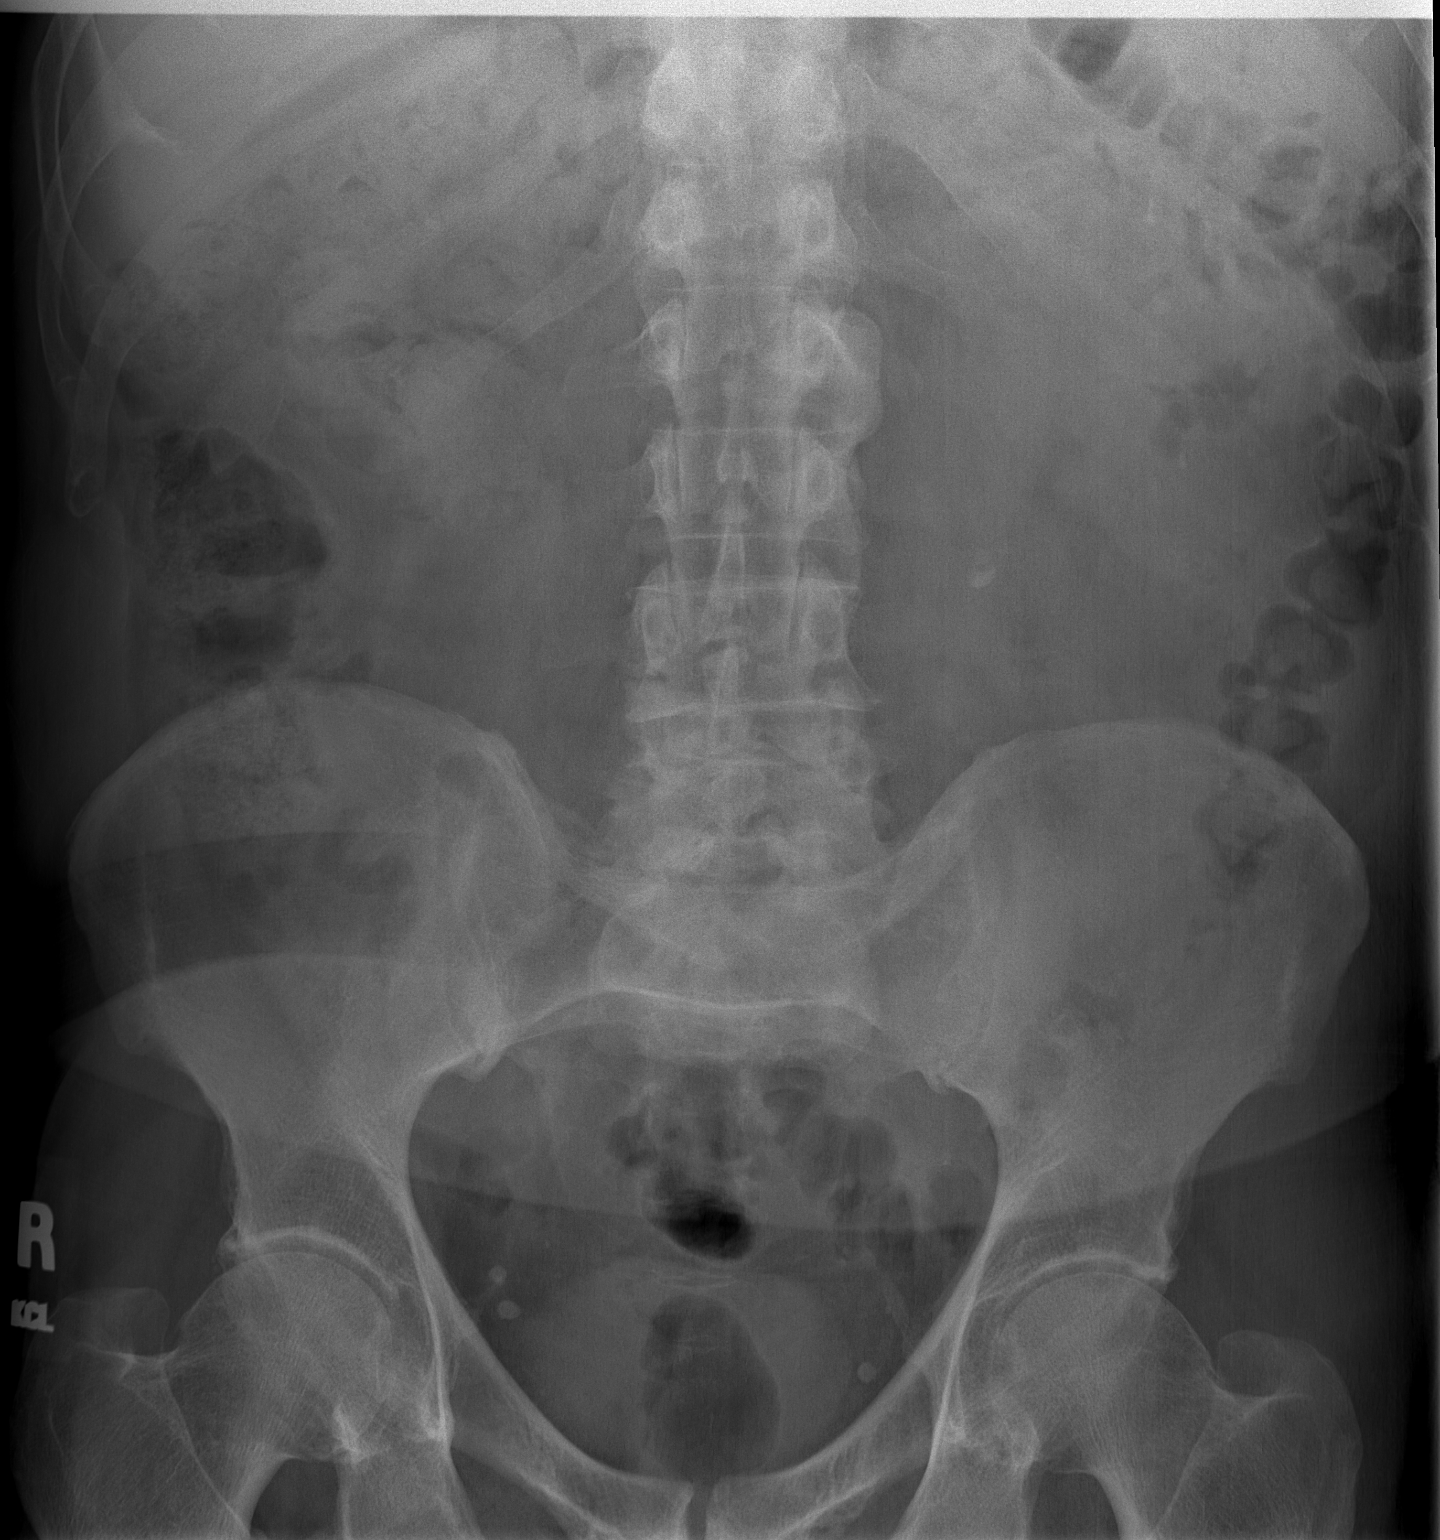

[t abdomen supine (2 of 2)]
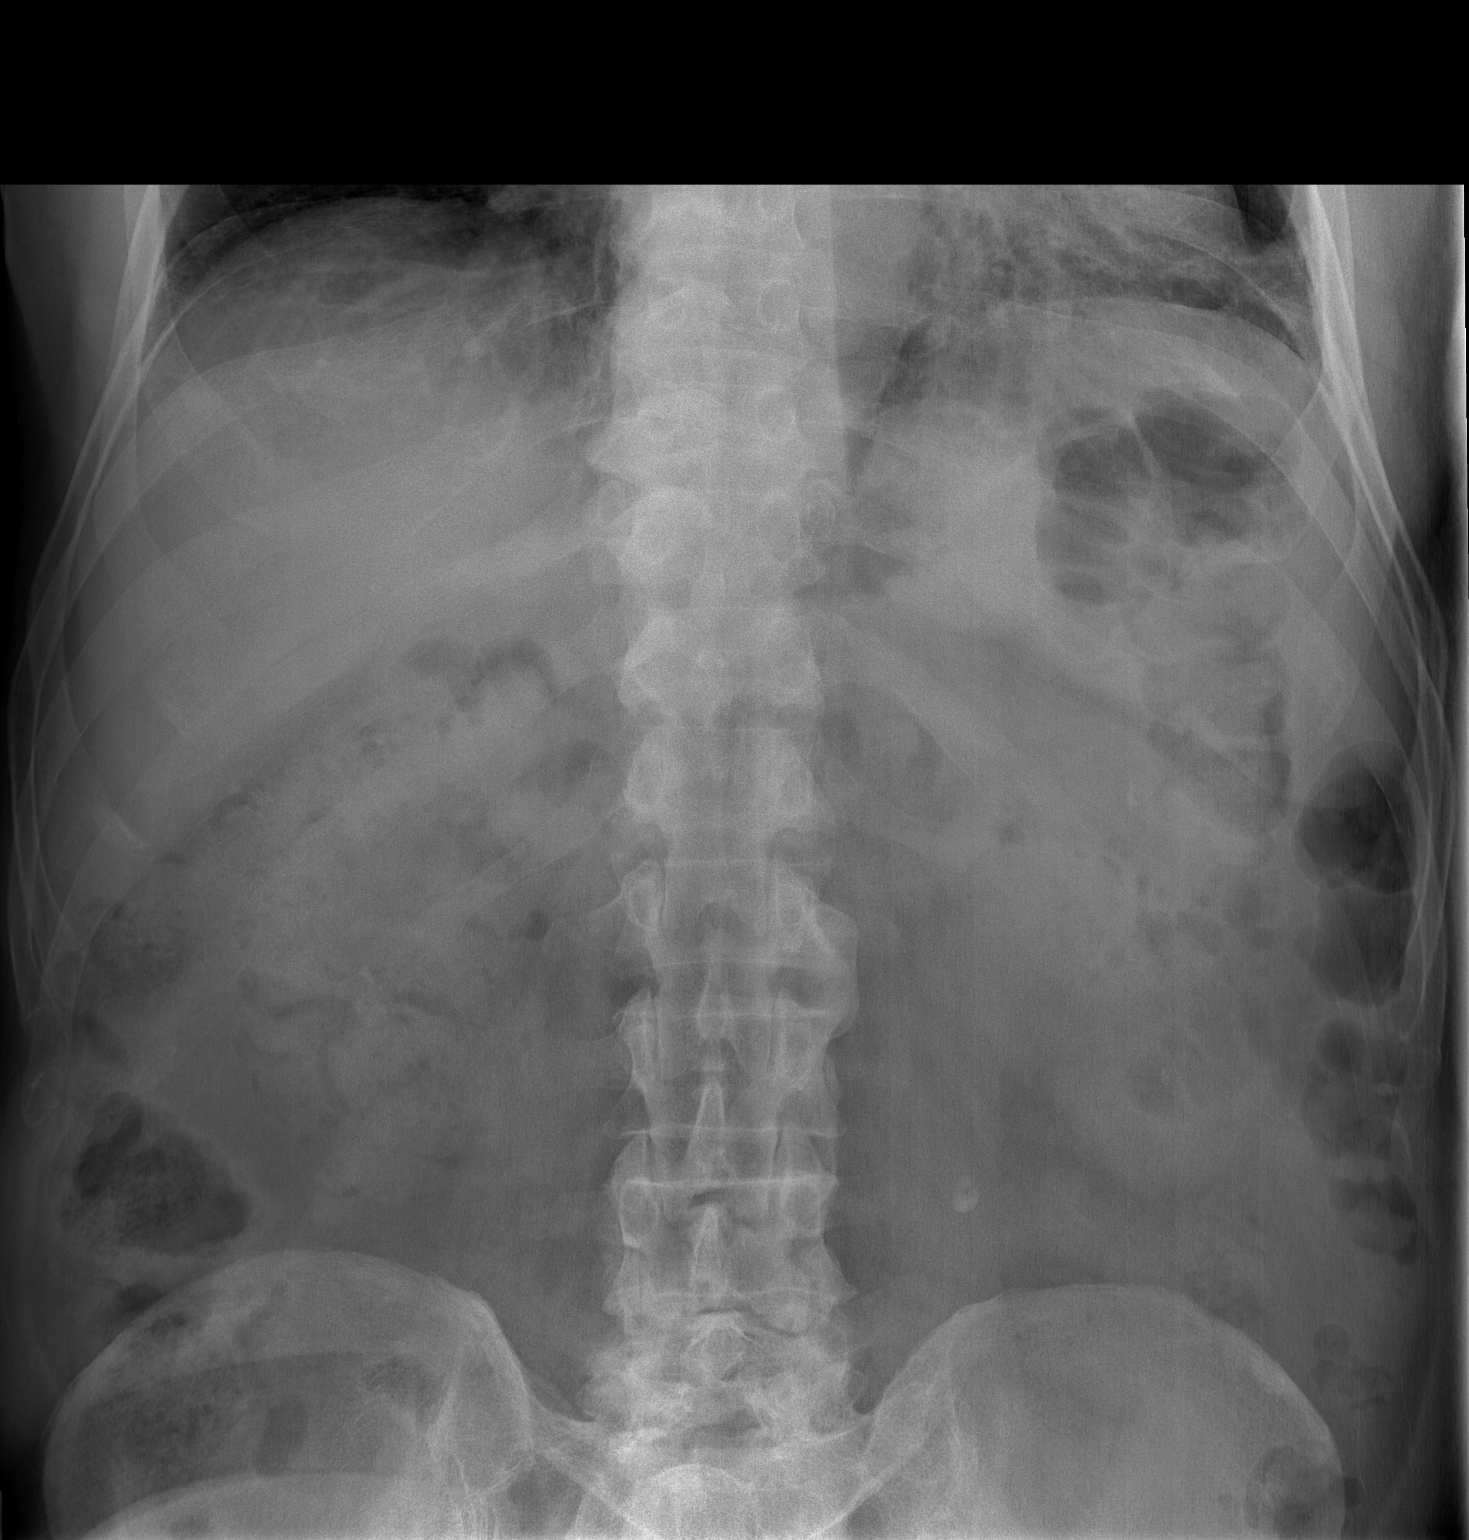

[2 of 2 positions shown; findings below may reference images not displayed]

FINDINGS: 10-12 mm stone on the left at about the level of the L2-3 disc
space, most consistent with a left ureteral stone. Question few
other tiny left renal calculi. Phleboliths in the pelvis. Question
small right renal stone as well.
IMPRESSION: 12 mm stone on the left probably in the proximal ureter at the level
of the L2-3 disc.

Probable other small bilateral renal calculi.

## 2019-03-21 ENCOUNTER — Other Ambulatory Visit: Payer: Self-pay | Admitting: Adult Health

## 2019-03-21 DIAGNOSIS — E1122 Type 2 diabetes mellitus with diabetic chronic kidney disease: Secondary | ICD-10-CM

## 2019-03-21 MED ORDER — BLOOD GLUCOSE MONITORING SUPPL DEVI: 0 refills | Status: DC

## 2019-03-25 ENCOUNTER — Other Ambulatory Visit: Payer: Self-pay

## 2019-03-25 ENCOUNTER — Other Ambulatory Visit: Payer: Self-pay | Admitting: Internal Medicine

## 2019-03-25 DIAGNOSIS — E1122 Type 2 diabetes mellitus with diabetic chronic kidney disease: Secondary | ICD-10-CM

## 2019-03-25 MED ORDER — LANCETS MISC: 11 refills | Status: DC

## 2019-03-25 MED ORDER — ONETOUCH VERIO VI STRP: ORAL_STRIP | 12 refills | Status: DC

## 2019-03-25 NOTE — Telephone Encounter (Signed)
Refill request for One Touch Verio test strips and lancets

## 2019-03-26 ENCOUNTER — Encounter: Payer: Self-pay | Admitting: *Deleted

## 2019-04-09 DIAGNOSIS — L821 Other seborrheic keratosis: Secondary | ICD-10-CM | POA: Diagnosis not present

## 2019-04-09 DIAGNOSIS — D2262 Melanocytic nevi of left upper limb, including shoulder: Secondary | ICD-10-CM | POA: Diagnosis not present

## 2019-04-09 DIAGNOSIS — D225 Melanocytic nevi of trunk: Secondary | ICD-10-CM | POA: Diagnosis not present

## 2019-04-09 DIAGNOSIS — L578 Other skin changes due to chronic exposure to nonionizing radiation: Secondary | ICD-10-CM | POA: Diagnosis not present

## 2019-04-22 ENCOUNTER — Encounter: Payer: Self-pay | Admitting: Internal Medicine

## 2019-04-22 NOTE — Patient Instructions (Signed)

## 2019-04-22 NOTE — Progress Notes (Signed)
History of Present Illness:      This very nice 78 y.o. MWM presents for 6 month follow up with HTN, HLD, T2_NIDDM and Vitamin D Deficiency. Patient  has hx/o GERD controlled with diet & his meds.       Patient is treated for HTN (2010) & BP has been controlled at home. Today's BP is at goal - 126/74. Patient has had no complaints of any cardiac type chest pain, palpitations, dyspnea / orthopnea / PND, dizziness, claudication, or dependent edema.      Hyperlipidemia is controlled with diet & meds. Patient denies myalgias or other med SE's. Last Lipids were at goal: Lab Results  Component Value Date   CHOL 142 09/03/2018   HDL 52 09/03/2018   LDLCALC 75 09/03/2018   TRIG 71 09/03/2018   CHOLHDL 2.7 09/03/2018       Also, the patient has history of PreDiabetes (A1c 6.1% / 2011)  and has had no symptoms of reactive hypoglycemia, diabetic polys, paresthesias or visual blurring.  Last A1c was not at goal: Lab Results  Component Value Date   HGBA1C 6.9 (H) 09/03/2018       Further, the patient also has history of Vitamin D Deficiency("29" / 2008)and supplements vitamin D without any suspected side-effects. Last vitamin D was at goal: Lab Results  Component Value Date   VD25OH 66 09/03/2018   Current Outpatient Medications on File Prior to Visit  Medication Sig  . APPLE CIDER VINEGAR PO Take by mouth.  Marland Kitchen aspirin 81 MG tablet Take 81 mg by mouth daily.  Marland Kitchen atorvastatin (LIPITOR) 40 MG tablet Take 1 tablet daily for Cholesterol  . Blood Glucose Monitoring Suppl DEVI Use device to test blood sugar once daily  . Cholecalciferol (VITAMIN D PO) Take 5,000 Units by mouth daily.  Marland Kitchen CINNAMON PO Take by mouth. Take 1-2 tablets daily  . famotidine (PEPCID) 40 MG tablet Take 1 tablet at Bedtime if needed for Heartburn & Reflux  . glucose blood (ONETOUCH VERIO) test strip Test blood sugar once daily  . Lancets MISC Test blood sugar once daily  . metFORMIN (GLUCOPHAGE) 500 MG tablet Take 1  tablet with Breakfast & Lunch and 2 tablets with supper for Diabetes  . Omega-3 Fatty Acids (FISH OIL) 1000 MG CAPS Take 1 capsule by mouth daily.  . pantoprazole (PROTONIX) 40 MG tablet Take one tablet daily  . sildenafil (REVATIO) 20 MG tablet TAKE 1-5 TABLETS BY MOUTH ONE HOUR BEFORE NEEDED  . tadalafil (CIALIS) 20 MG tablet Take 1/2 to 1 tablet every 2 to 3 days as needed for XXXX  . tamsulosin (FLOMAX) 0.4 MG CAPS capsule Take 1 capsule by mouth once daily   No current facility-administered medications on file prior to visit.    Allergies  Allergen Reactions  . Bystolic [Nebivolol Hcl]    PMHx:  Past Medical History:  Diagnosis Date  . BPH (benign prostatic hyperplasia)   . ED (erectile dysfunction)   . GERD (gastroesophageal reflux disease)   . History of kidney stones   . Hyperlipidemia   . Hypertension   . IBS (irritable bowel syndrome)   . LBBB (left bundle branch block)   . Personal history of kidney stones   . Pre-diabetes   . Prediabetes   . Vitamin D deficiency    Immunization History  Administered Date(s) Administered  . DT 04/08/2014  . Influenza, High Dose Seasonal PF 06/24/2014, 07/28/2015, 05/02/2016, 07/31/2017, 05/30/2018  . Influenza-Unspecified  07/02/2013  . Pneumococcal Conjugate-13 04/09/2015  . Pneumococcal-Unspecified 10/02/2007  . Td 10/02/2003  . Zoster 10/01/2006   Past Surgical History:  Procedure Laterality Date  . CATARACT EXTRACTION W/ INTRAOCULAR LENS IMPLANT  2013   left  . EXTRACORPOREAL SHOCK WAVE LITHOTRIPSY Left 12/11/2017   Procedure: LEFT EXTRACORPOREAL SHOCK WAVE LITHOTRIPSY (ESWL);  Surgeon: Lucas Mallow, MD;  Location: [facility];  Service: Urology;  Laterality: Left;  . KIDNEY STONE SURGERY     FHx:    Reviewed / unchanged  SHx:    Reviewed / unchanged   Systems Review:  Constitutional: Denies fever, chills, wt changes, headaches, insomnia, fatigue, night sweats, change in appetite. Eyes: Denies redness, blurred  vision, diplopia, discharge, itchy, watery eyes.  ENT: Denies discharge, congestion, post nasal drip, epistaxis, sore throat, earache, hearing loss, dental pain, tinnitus, vertigo, sinus pain, snoring.  CV: Denies chest pain, palpitations, irregular heartbeat, syncope, dyspnea, diaphoresis, orthopnea, PND, claudication or edema. Respiratory: denies cough, dyspnea, DOE, pleurisy, hoarseness, laryngitis, wheezing.  Gastrointestinal: Denies dysphagia, odynophagia, heartburn, reflux, water brash, abdominal pain or cramps, nausea, vomiting, bloating, diarrhea, constipation, hematemesis, melena, hematochezia  or hemorrhoids. Genitourinary: Denies dysuria, frequency, urgency, nocturia, hesitancy, discharge, hematuria or flank pain. Musculoskeletal: Denies arthralgias, myalgias, stiffness, jt. swelling, pain, limping or strain/sprain.  Skin: Denies pruritus, rash, hives, warts, acne, eczema or change in skin lesion(s). Neuro: No weakness, tremor, incoordination, spasms, paresthesia or pain. Psychiatric: Denies confusion, memory loss or sensory loss. Endo: Denies change in weight, skin or hair change.  Heme/Lymph: No excessive bleeding, bruising or enlarged lymph nodes.  Physical Exam  BP 126/74   Pulse (!) 56   Temp 97.6 F (36.4 C)   Resp 16   Ht 5\' 8"  (1.727 m)   Wt 179 lb (81.2 kg)   BMI 27.22 kg/m   Appears  well nourished, well groomed  and in no distress.  Eyes: PERRLA, EOMs, conjunctiva no swelling or erythema. Sinuses: No frontal/maxillary tenderness ENT/Mouth: EAC's clear, TM's nl w/o erythema, bulging. Nares clear w/o erythema, swelling, exudates. Oropharynx clear without erythema or exudates. Oral hygiene is good. Tongue normal, non obstructing. Hearing intact.  Neck: Supple. Thyroid not palpable. Car 2+/2+ without bruits, nodes or JVD. Chest: Respirations nl with BS clear & equal w/o rales, rhonchi, wheezing or stridor.  Cor: Heart sounds normal w/ regular rate and rhythm without  sig. murmurs, gallops, clicks or rubs. Peripheral pulses normal and equal  without edema.  Abdomen: Soft & bowel sounds normal. Non-tender w/o guarding, rebound, hernias, masses or organomegaly.  Lymphatics: Unremarkable.  Musculoskeletal: Full ROM all peripheral extremities, joint stability, 5/5 strength and normal gait.  Skin: Warm, dry without exposed rashes, lesions or ecchymosis apparent.  Neuro: Cranial nerves intact, reflexes equal bilaterally. Sensory-motor testing grossly intact. Tendon reflexes grossly intact.  Pysch: Alert & oriented x 3.  Insight and judgement nl & appropriate. No ideations.  Assessment and Plan:  1. Essential hypertension  - Continue medication, monitor blood pressure at home.  - Continue DASH diet.  Reminder to go to the ER if any CP,  SOB, nausea, dizziness, severe HA, changes vision/speech.  - CBC with Differential/Platelet - COMPLETE METABOLIC PANEL WITH GFR - Magnesium - TSH  2. Hyperlipidemia, mixed  - Continue diet/meds, exercise,& lifestyle modifications.  - Continue monitor periodic cholesterol/liver & renal functions   - Lipid panel - TSH  3. Type 2 diabetes mellitus with stage 3 chronic kidney disease, without long-term current use of insulin ([facility])  - Continue  diet, exercise  - Lifestyle modifications.  - Monitor appropriate labs.  - Hemoglobin A1c - Insulin, random  4. Vitamin D deficiency  - Continue supplementation.  - VITAMIN D 25 Hydroxyl  5. Medication management  - CBC with Differential/Platelet - COMPLETE METABOLIC PANEL WITH GFR - Magnesium - Lipid panel - TSH - Hemoglobin A1c - Insulin, random - VITAMIN D 25 Hydroxyl       Discussed  regular exercise, BP monitoring, weight control to achieve/maintain BMI less than 25 and discussed med and SE's. Recommended labs to assess and monitor clinical status with further disposition pending results of labs.  I discussed the assessment and treatment plan with the patient.  The patient was provided an opportunity to ask questions and all were answered. The patient agreed with the plan and demonstrated an understanding of the instructions.  I provided over 30 minutes of exam, counseling, chart review and  complex critical decision making.  Kirtland Bouchard, MD

## 2019-04-23 ENCOUNTER — Other Ambulatory Visit: Payer: Self-pay

## 2019-04-23 ENCOUNTER — Ambulatory Visit (INDEPENDENT_AMBULATORY_CARE_PROVIDER_SITE_OTHER): Payer: PPO | Admitting: Internal Medicine

## 2019-04-23 VITALS — BP 126/74 | HR 56 | Temp 97.6°F | Resp 16 | Ht 68.0 in | Wt 179.0 lb

## 2019-04-23 DIAGNOSIS — E559 Vitamin D deficiency, unspecified: Secondary | ICD-10-CM

## 2019-04-23 DIAGNOSIS — E782 Mixed hyperlipidemia: Secondary | ICD-10-CM

## 2019-04-23 DIAGNOSIS — E1122 Type 2 diabetes mellitus with diabetic chronic kidney disease: Secondary | ICD-10-CM | POA: Diagnosis not present

## 2019-04-23 DIAGNOSIS — Z79899 Other long term (current) drug therapy: Secondary | ICD-10-CM | POA: Diagnosis not present

## 2019-04-23 DIAGNOSIS — I10 Essential (primary) hypertension: Secondary | ICD-10-CM | POA: Diagnosis not present

## 2019-04-23 DIAGNOSIS — N183 Chronic kidney disease, stage 3 (moderate): Secondary | ICD-10-CM | POA: Diagnosis not present

## 2019-04-24 LAB — CBC WITH DIFFERENTIAL/PLATELET
Absolute Monocytes: 413 {cells}/uL (ref 200–950)
Basophils Absolute: 19 {cells}/uL (ref 0–200)
Basophils Relative: 0.4 %
Eosinophils Absolute: 53 {cells}/uL (ref 15–500)
Eosinophils Relative: 1.1 %
HCT: 42.8 % (ref 38.5–50.0)
Hemoglobin: 14.2 g/dL (ref 13.2–17.1)
Lymphs Abs: 1190 {cells}/uL (ref 850–3900)
MCH: 29.5 pg (ref 27.0–33.0)
MCHC: 33.2 g/dL (ref 32.0–36.0)
MCV: 89 fL (ref 80.0–100.0)
MPV: 11.9 fL (ref 7.5–12.5)
Monocytes Relative: 8.6 %
Neutro Abs: 3125 {cells}/uL (ref 1500–7800)
Neutrophils Relative %: 65.1 %
Platelets: 145 10*3/uL (ref 140–400)
RBC: 4.81 Million/uL (ref 4.20–5.80)
RDW: 12.8 % (ref 11.0–15.0)
Total Lymphocyte: 24.8 %
WBC: 4.8 10*3/uL (ref 3.8–10.8)

## 2019-04-24 LAB — HEMOGLOBIN A1C
Hgb A1c MFr Bld: 6.7 % of total Hgb — ABNORMAL HIGH (ref <5.7)
Mean Plasma Glucose: 146 (calc)
eAG (mmol/L): 8.1 (calc)

## 2019-04-24 LAB — LIPID PANEL
Cholesterol: 111 mg/dL (ref <200)
HDL: 44 mg/dL (ref >=40)
LDL Cholesterol (Calc): 54 mg/dL (calc)
Non-HDL Cholesterol (Calc): 67 mg/dL (calc) (ref <130)
Total CHOL/HDL Ratio: 2.5 (calc) (ref <5.0)
Triglycerides: 58 mg/dL (ref <150)

## 2019-04-24 LAB — COMPLETE METABOLIC PANEL WITHOUT GFR
AG Ratio: 2.5 (calc) (ref 1.0–2.5)
ALT: 13 U/L (ref 9–46)
AST: 10 U/L (ref 10–35)
Albumin: 4.2 g/dL (ref 3.6–5.1)
Alkaline phosphatase (APISO): 63 U/L (ref 35–144)
BUN: 15 mg/dL (ref 7–25)
CO2: 25 mmol/L (ref 20–32)
Calcium: 9 mg/dL (ref 8.6–10.3)
Chloride: 109 mmol/L (ref 98–110)
Creat: 1.13 mg/dL (ref 0.70–1.18)
GFR, Est African American: 72 mL/min/{1.73_m2}
GFR, Est Non African American: 62 mL/min/{1.73_m2}
Globulin: 1.7 g/dL — ABNORMAL LOW (ref 1.9–3.7)
Glucose, Bld: 123 mg/dL — ABNORMAL HIGH (ref 65–99)
Potassium: 4 mmol/L (ref 3.5–5.3)
Sodium: 141 mmol/L (ref 135–146)
Total Bilirubin: 0.6 mg/dL (ref 0.2–1.2)
Total Protein: 5.9 g/dL — ABNORMAL LOW (ref 6.1–8.1)

## 2019-04-24 LAB — VITAMIN D 25 HYDROXY (VIT D DEFICIENCY, FRACTURES): Vit D, 25-Hydroxy: 70 ng/mL (ref 30–100)

## 2019-04-24 LAB — INSULIN, RANDOM: Insulin: 7.3 u[IU]/mL

## 2019-04-24 LAB — TSH: TSH: 1.55 mIU/L (ref 0.40–4.50)

## 2019-04-24 LAB — MAGNESIUM: Magnesium: 1.7 mg/dL (ref 1.5–2.5)

## 2019-04-27 ENCOUNTER — Encounter: Payer: Self-pay | Admitting: Internal Medicine

## 2019-06-14 ENCOUNTER — Other Ambulatory Visit: Payer: Self-pay | Admitting: Internal Medicine

## 2019-06-20 ENCOUNTER — Ambulatory Visit (INDEPENDENT_AMBULATORY_CARE_PROVIDER_SITE_OTHER): Payer: PPO

## 2019-06-20 ENCOUNTER — Other Ambulatory Visit: Payer: Self-pay

## 2019-06-20 VITALS — Temp 97.5°F

## 2019-06-20 DIAGNOSIS — Z23 Encounter for immunization: Secondary | ICD-10-CM

## 2019-07-24 NOTE — Progress Notes (Signed)
FOLLOW UP  Assessment and Plan:   Hypertension -Continue medication, monitor blood pressure at home. Continue DASH diet.  Reminder to go to the ER if any CP, SOB, nausea, dizziness, severe HA, changes vision/speech, left arm numbness and tingling and jaw pain.  Cholesterol -Continue diet and exercise. Check cholesterol.    Diabetes with diabetic chronic kidney disease -Continue diet and exercise. Check A1C  Vitamin D Def - check level and continue medications.   Continue diet and meds as discussed. Further disposition pending results of labs. Discussed med's effects and SE's.   Over 30 minutes of exam, counseling, chart review, and critical decision making was performed Future Appointments  Date Time Provider [place]  11/06/2019 10:00 AM Unk Pinto, MD [facility] None  01/07/2020  9:00 AM Vicie Mutters, PA-C [facility] None     HPI 78 y.o. male  presents for 3 month follow up on hypertension, cholesterol, diabetes and vitamin D deficiency.   His blood pressure has been controlled at home declines ACE/ARB at this time, today their BP is BP: 136/80   He does workout. He denies chest pain, shortness of breath, dizziness. BMI is Body mass index is 26.61 kg/m., he is working on diet and exercise. Wt Readings from Last 3 Encounters:  07/25/19 175 lb (79.4 kg)  04/23/19 179 lb (81.2 kg)  12/28/18 178 lb 6.4 oz (80.9 kg)    He is on cholesterol medication, lipitor 40 and denies myalgias. His cholesterol is not at goal. The cholesterol last visit was:   Lab Results  Component Value Date   CHOL 111 04/23/2019   HDL 44 04/23/2019   LDLCALC 54 04/23/2019   TRIG 58 04/23/2019   CHOLHDL 2.5 04/23/2019   He has been working on diet and exercise for Diabetes with diabetic chronic kidney disease, he is on bASA, he is not on ACE/ARB, he is on metformin 2-3 a day, he does check his sugars, normally around 110 and denies paresthesia of the feet, polydipsia, polyuria and  visual disturbances. Last A1C was:  Lab Results  Component Value Date   HGBA1C 6.7 (H) 04/23/2019     Lab Results  Component Value Date   GFRNONAA 62 04/23/2019   Patient is on Vitamin D supplement.   Lab Results  Component Value Date   VD25OH 36 04/23/2019      Current Medications:  Current Outpatient Medications on File Prior to Visit  Medication Sig  . APPLE CIDER VINEGAR PO Take by mouth.  Marland Kitchen aspirin 81 MG tablet Take 81 mg by mouth daily.  Marland Kitchen atorvastatin (LIPITOR) 40 MG tablet Take 1 tablet daily for Cholesterol  . Blood Glucose Monitoring Suppl DEVI Use device to test blood sugar once daily  . Cholecalciferol (VITAMIN D PO) Take 5,000 Units by mouth daily.  Marland Kitchen CINNAMON PO Take by mouth. Take 1-2 tablets daily  . famotidine (PEPCID) 40 MG tablet Take 1 tablet at Bedtime if needed for Heartburn & Reflux  . glucose blood (ONETOUCH VERIO) test strip Test blood sugar once daily  . Lancets MISC Test blood sugar once daily  . metFORMIN (GLUCOPHAGE) 500 MG tablet Take 1 tablet with Breakfast & Lunch and 2 tablets with supper for Diabetes  . Omega-3 Fatty Acids (FISH OIL) 1000 MG CAPS Take 1 capsule by mouth daily.  . pantoprazole (PROTONIX) 40 MG tablet Take one tablet daily  . sildenafil (REVATIO) 20 MG tablet TAKE 1-5 TABLETS BY MOUTH ONE HOUR BEFORE NEEDED  . tadalafil (CIALIS) 20 MG  tablet Take 1/2 to 1 tablet every 2 to 3 days as needed for XXXX  . tamsulosin (FLOMAX) 0.4 MG CAPS capsule Take 1 capsule Daily for Prostate   No current facility-administered medications on file prior to visit.     Medical History:  Past Medical History:  Diagnosis Date  . BPH (benign prostatic hyperplasia)   . ED (erectile dysfunction)   . GERD (gastroesophageal reflux disease)   . History of kidney stones   . Hyperlipidemia   . Hypertension   . IBS (irritable bowel syndrome)   . LBBB (left bundle branch block)   . Personal history of kidney stones   . Pre-diabetes   . Prediabetes    . Vitamin D deficiency    Allergies:  Allergies  Allergen Reactions  . Bystolic [Nebivolol Hcl]      Review of Systems:  Review of Systems  Constitutional: Negative.   HENT: Negative.   Eyes: Negative.   Respiratory: Negative.   Cardiovascular: Negative.   Gastrointestinal: Negative.   Genitourinary: Negative.   Musculoskeletal: Positive for joint pain.  Skin: Negative.     Family history- Review and unchanged Social history- Review and unchanged Physical Exam: BP 136/80   Pulse (!) 54   Temp (!) 97.3 F (36.3 C)   Ht 5\' 8"  (1.727 m)   Wt 175 lb (79.4 kg)   SpO2 97%   BMI 26.61 kg/m  Wt Readings from Last 3 Encounters:  07/25/19 175 lb (79.4 kg)  04/23/19 179 lb (81.2 kg)  12/28/18 178 lb 6.4 oz (80.9 kg)   General Appearance: Well nourished, in no apparent distress. Eyes: PERRLA, EOMs, conjunctiva no swelling or erythema Sinuses: No Frontal/maxillary tenderness ENT/Mouth: Ext aud canals clear, TMs without erythema, bulging. No erythema, swelling, or exudate on post pharynx.  Tonsils not swollen or erythematous. Hearing normal.  Neck: Supple, thyroid normal.  Respiratory: Respiratory effort normal, BS equal bilaterally without rales, rhonchi, wheezing or stridor.  Cardio: RRR with no MRGs. Brisk peripheral pulses without edema.  Abdomen: Soft, + BS.  Non tender, no guarding, rebound, hernias, masses. Lymphatics: Non tender without lymphadenopathy.  Musculoskeletal: Full ROM, 5/5 strength, Normal gait.  Skin: Warm, dry without rashes, lesions, ecchymosis.  Neuro: Cranial nerves intact. No cerebellar symptoms.  Psych: Awake and oriented X 3, normal affect, Insight and Judgment appropriate.    Vicie Mutters, PA-C 10:52 AM [facility]

## 2019-07-25 ENCOUNTER — Other Ambulatory Visit: Payer: Self-pay

## 2019-07-25 ENCOUNTER — Ambulatory Visit (INDEPENDENT_AMBULATORY_CARE_PROVIDER_SITE_OTHER): Payer: PPO | Admitting: Physician Assistant

## 2019-07-25 ENCOUNTER — Encounter: Payer: Self-pay | Admitting: Physician Assistant

## 2019-07-25 VITALS — BP 136/80 | HR 54 | Temp 97.3°F | Ht 68.0 in | Wt 175.0 lb

## 2019-07-25 DIAGNOSIS — E1122 Type 2 diabetes mellitus with diabetic chronic kidney disease: Secondary | ICD-10-CM

## 2019-07-25 DIAGNOSIS — F17200 Nicotine dependence, unspecified, uncomplicated: Secondary | ICD-10-CM

## 2019-07-25 DIAGNOSIS — E782 Mixed hyperlipidemia: Secondary | ICD-10-CM

## 2019-07-25 DIAGNOSIS — Z79899 Other long term (current) drug therapy: Secondary | ICD-10-CM | POA: Diagnosis not present

## 2019-07-25 DIAGNOSIS — N183 Chronic kidney disease, stage 3 unspecified: Secondary | ICD-10-CM | POA: Diagnosis not present

## 2019-07-25 DIAGNOSIS — E559 Vitamin D deficiency, unspecified: Secondary | ICD-10-CM | POA: Diagnosis not present

## 2019-07-25 DIAGNOSIS — I10 Essential (primary) hypertension: Secondary | ICD-10-CM

## 2019-07-25 NOTE — Patient Instructions (Signed)
General eating tips  What to Avoid . Avoid added sugars o Often added sugar can be found in processed foods such as many condiments, dry cereals, cakes, cookies, chips, crisps, crackers, candies, sweetened drinks, etc.  o Read labels and AVOID/DECREASE use of foods with the following in their ingredient list: Sugar, fructose, high fructose corn syrup, sucrose, glucose, maltose, dextrose, molasses, cane sugar, brown sugar, any type of syrup, agave nectar, etc.   . Avoid snacking in between meals- drink water or if you feel you need a snack, pick a high water content snack such as cucumbers, watermelon, or any veggie.  . Avoid foods made with flour o If you are going to eat food made with flour, choose those made with whole-grains; and, minimize your consumption as much as is tolerable . Avoid processed foods o These foods are generally stocked in the middle of the grocery store.  o Focus on shopping on the perimeter of the grocery.  What to Include . Vegetables o GREEN LEAFY VEGETABLES: Kale, spinach, mustard greens, collard greens, cabbage, broccoli, etc. o OTHER: Asparagus, cauliflower, eggplant, carrots, peas, Brussel sprouts, tomatoes, bell peppers, zucchini, beets, cucumbers, etc. . Grains, seeds, and legumes o Beans: kidney beans, black eyed peas, garbanzo beans, black beans, pinto beans, etc. o Whole, unrefined grains: brown rice, barley, bulgur, oatmeal, etc. . Healthy fats  o Avoid highly processed fats such as vegetable oil o Examples of healthy fats: avocado, olives, virgin olive oil, dark chocolate (?72% Cocoa), nuts (peanuts, almonds, walnuts, cashews, pecans, etc.) o Please still do small amount of these healthy fats, they are dense in calories.  . Low - Moderate Intake of Animal Sources of Protein o Meat sources: chicken, turkey, salmon, tuna. Limit to 4 ounces of meat at one time or the size of your palm. o Consider limiting dairy sources, but when choosing dairy focus on:  PLAIN Greek yogurt, cottage cheese, high-protein milk . Fruit o Choose berries   

## 2019-07-26 LAB — LIPID PANEL
Cholesterol: 149 mg/dL (ref <200)
HDL: 51 mg/dL (ref >=40)
LDL Cholesterol (Calc): 83 mg/dL (calc)
Non-HDL Cholesterol (Calc): 98 mg/dL (calc) (ref <130)
Total CHOL/HDL Ratio: 2.9 (calc) (ref <5.0)
Triglycerides: 71 mg/dL (ref <150)

## 2019-07-26 LAB — CBC WITH DIFFERENTIAL/PLATELET
Absolute Monocytes: 391 cells/uL (ref 200–950)
Basophils Absolute: 32 cells/uL (ref 0–200)
Basophils Relative: 0.7 %
Eosinophils Absolute: 41 cells/uL (ref 15–500)
Eosinophils Relative: 0.9 %
HCT: 45.8 % (ref 38.5–50.0)
Hemoglobin: 15 g/dL (ref 13.2–17.1)
Lymphs Abs: 1339 cells/uL (ref 850–3900)
MCH: 29.8 pg (ref 27.0–33.0)
MCHC: 32.8 g/dL (ref 32.0–36.0)
MCV: 90.9 fL (ref 80.0–100.0)
MPV: 11.6 fL (ref 7.5–12.5)
Monocytes Relative: 8.5 %
Neutro Abs: 2797 cells/uL (ref 1500–7800)
Neutrophils Relative %: 60.8 %
Platelets: 145 10*3/uL (ref 140–400)
RBC: 5.04 10*6/uL (ref 4.20–5.80)
RDW: 12.5 % (ref 11.0–15.0)
Total Lymphocyte: 29.1 %
WBC: 4.6 10*3/uL (ref 3.8–10.8)

## 2019-07-26 LAB — COMPLETE METABOLIC PANEL WITH GFR
AG Ratio: 2 (calc) (ref 1.0–2.5)
ALT: 12 U/L (ref 9–46)
AST: 10 U/L (ref 10–35)
Albumin: 4.3 g/dL (ref 3.6–5.1)
Alkaline phosphatase (APISO): 64 U/L (ref 35–144)
BUN/Creatinine Ratio: 13 (calc) (ref 6–22)
BUN: 17 mg/dL (ref 7–25)
CO2: 28 mmol/L (ref 20–32)
Calcium: 9.5 mg/dL (ref 8.6–10.3)
Chloride: 104 mmol/L (ref 98–110)
Creat: 1.27 mg/dL — ABNORMAL HIGH (ref 0.70–1.18)
GFR, Est African American: 63 mL/min/{1.73_m2} (ref >=60)
GFR, Est Non African American: 54 mL/min/{1.73_m2} — ABNORMAL LOW (ref >=60)
Globulin: 2.1 g/dL (calc) (ref 1.9–3.7)
Glucose, Bld: 128 mg/dL — ABNORMAL HIGH (ref 65–99)
Potassium: 4.5 mmol/L (ref 3.5–5.3)
Sodium: 140 mmol/L (ref 135–146)
Total Bilirubin: 0.8 mg/dL (ref 0.2–1.2)
Total Protein: 6.4 g/dL (ref 6.1–8.1)

## 2019-07-26 LAB — HEMOGLOBIN A1C
Hgb A1c MFr Bld: 6.3 % of total Hgb — ABNORMAL HIGH (ref <5.7)
Mean Plasma Glucose: 134 (calc)
eAG (mmol/L): 7.4 (calc)

## 2019-07-26 LAB — MAGNESIUM: Magnesium: 1.9 mg/dL (ref 1.5–2.5)

## 2019-07-26 LAB — VITAMIN D 25 HYDROXY (VIT D DEFICIENCY, FRACTURES): Vit D, 25-Hydroxy: 87 ng/mL (ref 30–100)

## 2019-07-26 LAB — TSH: TSH: 2 mIU/L (ref 0.40–4.50)

## 2019-09-20 ENCOUNTER — Encounter: Payer: Self-pay | Admitting: Internal Medicine

## 2019-10-03 ENCOUNTER — Ambulatory Visit: Payer: PPO | Attending: Internal Medicine

## 2019-10-03 DIAGNOSIS — Z23 Encounter for immunization: Secondary | ICD-10-CM | POA: Insufficient documentation

## 2019-10-03 NOTE — Progress Notes (Signed)
   Covid-19 Vaccination Clinic  Name:  Edward [name]    MRN: [id number] DOB: [birth year]  10/03/2019  [name] was observed post Covid-19 immunization for 15 minutes without incidence. He was provided with Vaccine Information Sheet and instruction to access the V-Safe system.   [name] was instructed to call 911 with any severe reactions post vaccine: Marland Kitchen Difficulty breathing  . Swelling of your face and throat  . A fast heartbeat  . A bad rash all over your body  . Dizziness and weakness    Immunizations Administered    Name Date Dose VIS Date Route   Pfizer COVID-19 Vaccine 10/03/2019  4:40 PM 0.3 mL 08/23/2019 Intramuscular   Manufacturer: [place]   Lot: [id number]   [place]: [id number]

## 2019-10-12 ENCOUNTER — Ambulatory Visit: Payer: PPO

## 2019-10-14 ENCOUNTER — Ambulatory Visit: Payer: PPO

## 2019-10-24 ENCOUNTER — Ambulatory Visit: Payer: PPO | Attending: Internal Medicine

## 2019-10-24 DIAGNOSIS — Z23 Encounter for immunization: Secondary | ICD-10-CM

## 2019-10-24 NOTE — Progress Notes (Signed)
   Covid-19 Vaccination Clinic  Name:  Edward [name]    MRN: [id number] DOB: Sep 11, 1941  10/24/2019  [name] was observed post Covid-19 immunization for 15 minutes without incidence. He was provided with Vaccine Information Sheet and instruction to access the V-Safe system.   [name] was instructed to call 911 with any severe reactions post vaccine: Marland Kitchen Difficulty breathing  . Swelling of your face and throat  . A fast heartbeat  . A bad rash all over your body  . Dizziness and weakness

## 2019-10-29 ENCOUNTER — Encounter: Payer: Self-pay | Admitting: Internal Medicine

## 2019-11-05 ENCOUNTER — Encounter: Payer: Self-pay | Admitting: Internal Medicine

## 2019-11-05 NOTE — Patient Instructions (Signed)

## 2019-11-05 NOTE — Progress Notes (Signed)
Annual  Screening/Preventative Visit  & Comprehensive Evaluation & Examination     This very nice 79 y.o. MWM  presents for a Screening /Preventative Visit & comprehensive evaluation and management of multiple medical co-morbidities.  Patient has been followed for HTN, HLD, T2_NIDDM  and Vitamin D Deficiency. Patient's GERD is quiescent on his diet &meds.      HTN predates since 2010. Patient's BP has been controlled at home.  Today's BP is at goal - 140/80. Patient denies any cardiac symptoms as chest pain, palpitations, shortness of breath, dizziness or ankle swelling.     Patient's hyperlipidemia is controlled with diet and Atorvastatin. Patient denies myalgias or other medication SE's. Last lipids were at goal:  Lab Results  Component Value Date   CHOL 149 07/25/2019   HDL 51 07/25/2019   LDLCALC 83 07/25/2019   TRIG 71 07/25/2019   CHOLHDL 2.9 07/25/2019       Patient has hx/o initially PreDiabetes (A1c 6.1% / 2011) and then   T2_NIDDM (A1c 6.5% / 2015 & 6.7% / 2017) and patient denies reactive hypoglycemic symptoms, visual blurring, diabetic polys or paresthesias. Last A1c was not at goal:  Lab Results  Component Value Date   HGBA1C 6.3 (H) 07/25/2019        Finally, patient has history of Vitamin D Deficiency ("29" / 2008)  and last vitamin D was at goal:  Lab Results  Component Value Date   VD25OH 87 07/25/2019    Current Outpatient Medications on File Prior to Visit  Medication Sig  . APPLE CIDER VINEGAR PO Take by mouth.  Marland Kitchen aspirin 81 MG tablet Take 81 mg by mouth daily.  Marland Kitchen atorvastatin (LIPITOR) 40 MG tablet Take 1 tablet daily for Cholesterol  . Blood Glucose Monitoring Suppl DEVI Use device to test blood sugar once daily  . Cholecalciferol (VITAMIN D PO) Take 5,000 Units by mouth daily.  Marland Kitchen CINNAMON PO Take by mouth. Take 1-2 tablets daily  . Cyanocobalamin (VITAMIN B 12 PO) Take 1 tablet by mouth daily.  . famotidine (PEPCID) 40 MG tablet Take 1 tablet at  Bedtime if needed for Heartburn & Reflux  . glucose blood (ONETOUCH VERIO) test strip Test blood sugar once daily  . Lancets MISC Test blood sugar once daily  . metFORMIN (GLUCOPHAGE) 500 MG tablet Take 1 tablet with Breakfast & Lunch and 2 tablets with supper for Diabetes  . Omega-3 Fatty Acids (FISH OIL) 1000 MG CAPS Take 1 capsule by mouth daily.  . pantoprazole (PROTONIX) 40 MG tablet Take one tablet daily  . sildenafil (REVATIO) 20 MG tablet TAKE 1-5 TABLETS BY MOUTH ONE HOUR BEFORE NEEDED  . tadalafil (CIALIS) 20 MG tablet Take 1/2 to 1 tablet every 2 to 3 days as needed for XXXX  . tamsulosin (FLOMAX) 0.4 MG CAPS capsule Take 1 capsule Daily for Prostate  . zinc gluconate 50 MG tablet Take 50 mg by mouth daily.   No current facility-administered medications on file prior to visit.   Allergies  Allergen Reactions  . Bystolic [Nebivolol Hcl]    Past Medical History:  Diagnosis Date  . BPH (benign prostatic hyperplasia)   . ED (erectile dysfunction)   . GERD (gastroesophageal reflux disease)   . History of kidney stones   . Hyperlipidemia   . Hypertension   . IBS (irritable bowel syndrome)   . LBBB (left bundle branch block)   . Personal history of kidney stones   . Pre-diabetes   . Prediabetes   .  Vitamin D deficiency    Health Maintenance  Topic Date Due  . URINE MICROALBUMIN  09/04/2019  . HEMOGLOBIN A1C  01/22/2020  . OPHTHALMOLOGY EXAM  03/18/2020  . FOOT EXAM  11/04/2020  . TETANUS/TDAP  04/08/2024  . INFLUENZA VACCINE  Completed  . PNA vac Low Risk Adult  Completed   Immunization History  Administered Date(s) Administered  . DT (Pediatric) 04/08/2014  . Influenza, High Dose Seasonal PF 06/24/2014, 07/28/2015, 05/02/2016, 07/31/2017, 05/30/2018, 06/20/2019  . Influenza-Unspecified 07/02/2013  . PFIZER SARS-COV-2 Vaccination 10/03/2019, 10/24/2019  . Pneumococcal Conjugate-13 04/09/2015  . Pneumococcal-Unspecified 10/02/2007  . Td 10/02/2003  . Zoster  10/01/2006   Last Colon - 07.12.2013 - Dr Deatra Ina - recc f/u Colon in 5 years - due Aug 2018 - patient aware overdue. Dr Havery Moros sent Recall letter 05.29.2018  Past Surgical History:  Procedure Laterality Date  . CATARACT EXTRACTION W/ INTRAOCULAR LENS IMPLANT  2013   left  . EXTRACORPOREAL SHOCK WAVE LITHOTRIPSY Left 12/11/2017   Procedure: LEFT EXTRACORPOREAL SHOCK WAVE LITHOTRIPSY (ESWL);  Surgeon: Lucas Mallow, MD;  Location: [facility];  Service: Urology;  Laterality: Left;  . KIDNEY STONE SURGERY     Family History  Problem Relation Age of Onset  . Diabetes Sister   . Hypertension Sister   . Heart disease Mother   . Diabetes Sister   . Hypertension Sister   . Colon cancer Neg Hx    Social History   Socioeconomic History  . Marital status: Married    Spouse name: Arbie Cookey   . Number of children: Not on file  Occupational History  . Retired - has a Programmer, applications business  Tobacco Use  . Smoking status: Former Smoker    Packs/day: 0.50    Years: 10.00    Pack years: 5.00    Quit date: 09/13/1967    Years since quitting: 52.1  . Smokeless tobacco: Never Used  Substance and Sexual Activity  . Alcohol use: No  . Drug use: No  . Sexual activity: Not on file    ROS Constitutional: Denies fever, chills, weight loss/gain, headaches, insomnia,  night sweats or change in appetite. Does c/o fatigue. Eyes: Denies redness, blurred vision, diplopia, discharge, itchy or watery eyes.  ENT: Denies discharge, congestion, post nasal drip, epistaxis, sore throat, earache, hearing loss, dental pain, Tinnitus, Vertigo, Sinus pain or snoring.  Cardio: Denies chest pain, palpitations, irregular heartbeat, syncope, dyspnea, diaphoresis, orthopnea, PND, claudication or edema Respiratory: denies cough, dyspnea, DOE, pleurisy, hoarseness, laryngitis or wheezing.  Gastrointestinal: Denies dysphagia, heartburn, reflux, water brash, pain, cramps, nausea, vomiting, bloating, diarrhea, constipation,  hematemesis, melena, hematochezia, jaundice or hemorrhoids Genitourinary: Denies dysuria, frequency,  discharge, hematuria or flank pain. Has urgency, nocturia x 2-3 & occasional hesitancy. Musculoskeletal: Denies arthralgia, myalgia, stiffness, Jt. Swelling, pain, limp or strain/sprain. Denies Falls. Skin: Denies puritis, rash, hives, warts, acne, eczema or change in skin lesion Neuro: No weakness, tremor, incoordination, spasms, paresthesia or pain Psychiatric: Denies confusion, memory loss or sensory loss. Denies Depression. Endocrine: Denies change in weight, skin, hair change, nocturia, and paresthesia, diabetic polys, visual blurring or hyper / hypo glycemic episodes.  Heme/Lymph: No excessive bleeding, bruising or enlarged lymph nodes.  Physical Exam  BP 140/80   Pulse (!) 52   Temp 97.8 F (36.6 C)   Resp 16   Ht 5' 8.5" (1.74 m)   Wt 178 lb 9.6 oz (81 kg)   BMI 26.76 kg/m   General Appearance: Well nourished and well  groomed and in no apparent distress.  Eyes: PERRLA, EOMs, conjunctiva no swelling or erythema, normal fundi and vessels. Sinuses: No frontal/maxillary tenderness ENT/Mouth: EACs patent / TMs  nl. Nares clear without erythema, swelling, mucoid exudates. Oral hygiene is good. No erythema, swelling, or exudate. Tongue normal, non-obstructing. Tonsils not swollen or erythematous. Hearing normal.  Neck: Supple, thyroid not palpable. No bruits, nodes or JVD. Respiratory: Respiratory effort normal.  BS equal and clear bilateral without rales, rhonci, wheezing or stridor. Cardio: Heart sounds are normal with regular rate and rhythm and no murmurs, rubs or gallops. Peripheral pulses are normal and equal bilaterally without edema. No aortic or femoral bruits. Chest: symmetric with normal excursions and percussion.  Abdomen: Soft, with Nl bowel sounds. Nontender, no guarding, rebound, hernias, masses, or organomegaly.  Lymphatics: Non tender without lymphadenopathy.    Musculoskeletal: Full ROM all peripheral extremities, joint stability, 5/5 strength, and normal gait. Skin: Warm and dry without rashes, lesions, cyanosis, clubbing or  ecchymosis.  Neuro: Cranial nerves intact, reflexes equal bilaterally. Normal muscle tone, no cerebellar symptoms. Sensation intact to touch, vibratory and Monofilament to the toes bilaterally. Pysch: Alert and oriented X 3 with normal affect, insight and judgment appropriate.   Assessment and Plan  1. Annual Preventative/Screening Exam   2. Essential hypertension  - EKG 12-Lead - [place], RETROPERITNL ABD,  LTD - Urinalysis, Routine w reflex microscopic - Microalbumin / creatinine urine ratio - CBC with Differential/Platelet - COMPLETE METABOLIC PANEL WITH GFR - Magnesium - TSH  3. Hyperlipidemia associated with type 2 diabetes mellitus ([place])  - EKG 12-Lead - [place], RETROPERITNL ABD,  LTD - Lipid panel - TSH  4. Type 2 diabetes mellitus with stage 3a chronic kidney disease,  ithout long-term current use of insulin (HCC)  - EKG 12-Lead - [place], RETROPERITNL ABD,  LTD - HM DIABETES FOOT EXAM - LOW EXTREMITY NEUR EXAM DOCUM - Hemoglobin A1c - Insulin, random  5. Vitamin D deficiency  - VITAMIN D 25 Hydroxy   6. History of adenomatous polyp of colon  - Ambulatory referral to Gastroenterology  7. Prostate cancer screening  - PSA  8. BPH with obstruction/lower urinary tract symptoms  - PSA  9. Screening for ischemic heart disease  - EKG 12-Lead  10. FHx: heart disease  - EKG 12-Lead - [place], RETROPERITNL ABD,  LTD  11. Smoker  - EKG 12-Lead - [place], RETROPERITNL ABD,  LTD  12. Screening for AAA (aortic abdominal aneurysm)  - [place], RETROPERITNL ABD,  LTD  13. Medication management  - Urinalysis, Routine w reflex microscopic - Microalbumin / creatinine urine ratio - CBC with Differential/Platelet - COMPLETE METABOLIC PANEL WITH GFR - Magnesium - Lipid panel - TSH - Hemoglobin A1c - Insulin,  random - VITAMIN D 25 Hydroxy   14. Screening for colorectal cancer  - Ambulatory referral to Gastroenterology           Patient was counseled in prudent diet, weight control to achieve/maintain BMI less than 25, BP monitoring, regular exercise and medications as discussed.  Discussed med effects and SE's. Routine screening labs and tests as requested with regular follow-up as recommended. Over 40 minutes of exam, counseling, chart review and high complex critical decision making was performed   Kirtland Bouchard, MD

## 2019-11-06 ENCOUNTER — Ambulatory Visit (INDEPENDENT_AMBULATORY_CARE_PROVIDER_SITE_OTHER): Payer: PPO | Admitting: Internal Medicine

## 2019-11-06 ENCOUNTER — Other Ambulatory Visit: Payer: Self-pay

## 2019-11-06 VITALS — BP 140/80 | HR 52 | Temp 97.8°F | Resp 16 | Ht 68.5 in | Wt 178.6 lb

## 2019-11-06 DIAGNOSIS — E785 Hyperlipidemia, unspecified: Secondary | ICD-10-CM | POA: Diagnosis not present

## 2019-11-06 DIAGNOSIS — E1169 Type 2 diabetes mellitus with other specified complication: Secondary | ICD-10-CM | POA: Diagnosis not present

## 2019-11-06 DIAGNOSIS — N138 Other obstructive and reflux uropathy: Secondary | ICD-10-CM | POA: Diagnosis not present

## 2019-11-06 DIAGNOSIS — Z8249 Family history of ischemic heart disease and other diseases of the circulatory system: Secondary | ICD-10-CM | POA: Diagnosis not present

## 2019-11-06 DIAGNOSIS — Z125 Encounter for screening for malignant neoplasm of prostate: Secondary | ICD-10-CM

## 2019-11-06 DIAGNOSIS — E1121 Type 2 diabetes mellitus with diabetic nephropathy: Secondary | ICD-10-CM | POA: Diagnosis not present

## 2019-11-06 DIAGNOSIS — Z79899 Other long term (current) drug therapy: Secondary | ICD-10-CM | POA: Diagnosis not present

## 2019-11-06 DIAGNOSIS — E1122 Type 2 diabetes mellitus with diabetic chronic kidney disease: Secondary | ICD-10-CM

## 2019-11-06 DIAGNOSIS — Z0000 Encounter for general adult medical examination without abnormal findings: Secondary | ICD-10-CM

## 2019-11-06 DIAGNOSIS — F17200 Nicotine dependence, unspecified, uncomplicated: Secondary | ICD-10-CM

## 2019-11-06 DIAGNOSIS — N401 Enlarged prostate with lower urinary tract symptoms: Secondary | ICD-10-CM | POA: Diagnosis not present

## 2019-11-06 DIAGNOSIS — Z136 Encounter for screening for cardiovascular disorders: Secondary | ICD-10-CM

## 2019-11-06 DIAGNOSIS — Z0001 Encounter for general adult medical examination with abnormal findings: Secondary | ICD-10-CM

## 2019-11-06 DIAGNOSIS — E559 Vitamin D deficiency, unspecified: Secondary | ICD-10-CM

## 2019-11-06 DIAGNOSIS — I10 Essential (primary) hypertension: Secondary | ICD-10-CM | POA: Diagnosis not present

## 2019-11-06 DIAGNOSIS — N1831 Chronic kidney disease, stage 3a: Secondary | ICD-10-CM | POA: Diagnosis not present

## 2019-11-06 DIAGNOSIS — Z1211 Encounter for screening for malignant neoplasm of colon: Secondary | ICD-10-CM

## 2019-11-06 DIAGNOSIS — Z86010 Personal history of colonic polyps: Secondary | ICD-10-CM

## 2019-11-07 LAB — CBC WITH DIFFERENTIAL/PLATELET
Absolute Monocytes: 389 cells/uL (ref 200–950)
Basophils Absolute: 19 cells/uL (ref 0–200)
Basophils Relative: 0.4 %
Eosinophils Absolute: 91 cells/uL (ref 15–500)
Eosinophils Relative: 1.9 %
HCT: 43.1 % (ref 38.5–50.0)
Hemoglobin: 14.4 g/dL (ref 13.2–17.1)
Lymphs Abs: 1171 cells/uL (ref 850–3900)
MCH: 29.8 pg (ref 27.0–33.0)
MCHC: 33.4 g/dL (ref 32.0–36.0)
MCV: 89.2 fL (ref 80.0–100.0)
MPV: 11.4 fL (ref 7.5–12.5)
Monocytes Relative: 8.1 %
Neutro Abs: 3130 cells/uL (ref 1500–7800)
Neutrophils Relative %: 65.2 %
Platelets: 175 10*3/uL (ref 140–400)
RBC: 4.83 10*6/uL (ref 4.20–5.80)
RDW: 12.2 % (ref 11.0–15.0)
Total Lymphocyte: 24.4 %
WBC: 4.8 10*3/uL (ref 3.8–10.8)

## 2019-11-07 LAB — URINALYSIS, ROUTINE W REFLEX MICROSCOPIC
Bilirubin Urine: NEGATIVE
Glucose, UA: NEGATIVE
Hgb urine dipstick: NEGATIVE
Leukocytes,Ua: NEGATIVE
Nitrite: NEGATIVE
Protein, ur: NEGATIVE
Specific Gravity, Urine: 1.028 (ref 1.001–1.03)
pH: <=5 (ref 5.0–8.0)

## 2019-11-07 LAB — COMPLETE METABOLIC PANEL WITH GFR
AG Ratio: 1.9 (calc) (ref 1.0–2.5)
ALT: 11 U/L (ref 9–46)
AST: 11 U/L (ref 10–35)
Albumin: 3.9 g/dL (ref 3.6–5.1)
Alkaline phosphatase (APISO): 77 U/L (ref 35–144)
BUN/Creatinine Ratio: 13 (calc) (ref 6–22)
BUN: 17 mg/dL (ref 7–25)
CO2: 28 mmol/L (ref 20–32)
Calcium: 9.2 mg/dL (ref 8.6–10.3)
Chloride: 107 mmol/L (ref 98–110)
Creat: 1.27 mg/dL — ABNORMAL HIGH (ref 0.70–1.18)
GFR, Est African American: 63 mL/min/{1.73_m2} (ref >=60)
GFR, Est Non African American: 54 mL/min/{1.73_m2} — ABNORMAL LOW (ref >=60)
Globulin: 2.1 g/dL (calc) (ref 1.9–3.7)
Glucose, Bld: 159 mg/dL — ABNORMAL HIGH (ref 65–99)
Potassium: 3.9 mmol/L (ref 3.5–5.3)
Sodium: 142 mmol/L (ref 135–146)
Total Bilirubin: 0.6 mg/dL (ref 0.2–1.2)
Total Protein: 6 g/dL — ABNORMAL LOW (ref 6.1–8.1)

## 2019-11-07 LAB — MAGNESIUM: Magnesium: 1.7 mg/dL (ref 1.5–2.5)

## 2019-11-07 LAB — VITAMIN D 25 HYDROXY (VIT D DEFICIENCY, FRACTURES): Vit D, 25-Hydroxy: 103 ng/mL — ABNORMAL HIGH (ref 30–100)

## 2019-11-07 LAB — PSA: PSA: 2.7 ng/mL (ref <=4.0)

## 2019-11-07 LAB — TSH: TSH: 1.67 mIU/L (ref 0.40–4.50)

## 2019-11-07 LAB — HEMOGLOBIN A1C
Hgb A1c MFr Bld: 6.6 % of total Hgb — ABNORMAL HIGH (ref <5.7)
Mean Plasma Glucose: 143 (calc)
eAG (mmol/L): 7.9 (calc)

## 2019-11-07 LAB — LIPID PANEL
Cholesterol: 103 mg/dL (ref <200)
HDL: 41 mg/dL (ref >=40)
LDL Cholesterol (Calc): 48 mg/dL (calc)
Non-HDL Cholesterol (Calc): 62 mg/dL (calc) (ref <130)
Total CHOL/HDL Ratio: 2.5 (calc) (ref <5.0)
Triglycerides: 60 mg/dL (ref <150)

## 2019-11-07 LAB — MICROALBUMIN / CREATININE URINE RATIO
Creatinine, Urine: 221 mg/dL (ref 20–320)
Microalb Creat Ratio: 6 mcg/mg creat (ref <30)
Microalb, Ur: 1.4 mg/dL

## 2019-11-07 LAB — INSULIN, RANDOM: Insulin: 15.9 u[IU]/mL

## 2019-11-11 ENCOUNTER — Encounter: Payer: Self-pay | Admitting: Internal Medicine

## 2019-12-10 ENCOUNTER — Ambulatory Visit (AMBULATORY_SURGERY_CENTER): Payer: Self-pay

## 2019-12-10 ENCOUNTER — Other Ambulatory Visit: Payer: Self-pay

## 2019-12-10 VITALS — Temp 97.0°F | Ht 69.0 in | Wt 178.0 lb

## 2019-12-10 DIAGNOSIS — Z86010 Personal history of colonic polyps: Secondary | ICD-10-CM

## 2019-12-10 MED ORDER — PLENVU 140 G PO SOLR: 1.0000 | Freq: Once | ORAL | 0 refills | Status: AC

## 2019-12-10 NOTE — Progress Notes (Signed)
No egg or soy allergy known to patient  No issues with past sedation with any surgeries  or procedures, no intubation problems  No diet pills per patient No home 02 use per patient  No blood thinners per patient  Pt denies issues with constipation  No A fib or A flutter  EMMI video sent to pt's e mail   Pt completed covid vaccine series 10/24/19.  PLENVU SAMPLE GIVEN Lot # [id number] Expiration 12/2019  Due to the COVID-19 pandemic we are asking patients to follow these guidelines. Please only bring one care partner. Please be aware that your care partner may wait in the car in the parking lot or if they feel like they will be too hot to wait in the car, they may wait in the lobby on the 4th floor. All care partners are required to wear a mask the entire time (we do not have any that we can provide them), they need to practice social distancing, and we will do a Covid check for all patient's and care partners when you arrive. Also we will check their temperature and your temperature. If the care partner waits in their car they need to stay in the parking lot the entire time and we will call them on their cell phone when the patient is ready for discharge so they can bring the car to the front of the building. Also all patient's will need to wear a mask into building.

## 2019-12-19 ENCOUNTER — Encounter: Payer: Self-pay | Admitting: Internal Medicine

## 2019-12-24 ENCOUNTER — Encounter: Payer: Self-pay | Admitting: Internal Medicine

## 2019-12-24 ENCOUNTER — Ambulatory Visit (AMBULATORY_SURGERY_CENTER): Payer: PPO | Admitting: Internal Medicine

## 2019-12-24 ENCOUNTER — Other Ambulatory Visit: Payer: Self-pay

## 2019-12-24 VITALS — BP 117/58 | HR 56 | Temp 96.2°F | Resp 13 | Ht 69.0 in | Wt 178.0 lb

## 2019-12-24 DIAGNOSIS — D124 Benign neoplasm of descending colon: Secondary | ICD-10-CM | POA: Diagnosis not present

## 2019-12-24 DIAGNOSIS — D122 Benign neoplasm of ascending colon: Secondary | ICD-10-CM

## 2019-12-24 DIAGNOSIS — D125 Benign neoplasm of sigmoid colon: Secondary | ICD-10-CM

## 2019-12-24 DIAGNOSIS — Z86010 Personal history of colonic polyps: Secondary | ICD-10-CM | POA: Diagnosis not present

## 2019-12-24 MED ORDER — SODIUM CHLORIDE 0.9 % IV SOLN: 500.0000 mL | INTRAVENOUS | Status: DC

## 2019-12-24 NOTE — Op Note (Signed)
[facility] Patient Name: Edward [name] Procedure Date: 12/24/2019 8:41 AM MRN: [id number] Endoscopist: Docia Chuck. Henrene Pastor , MD Age: 79 Referring MD:  Date of Birth: 1941-03-14 Gender: Male Account #: [id number] Procedure:                Colonoscopy with cold snare polypectomy x 5 Indications:              High risk colon cancer surveillance: Personal                            history of non-advanced adenoma (July 2013, Dr.                            Deatra Ina) Medicines:                Monitored Anesthesia Care Procedure:                Pre-Anesthesia Assessment:                           - Prior to the procedure, a History and Physical                            was performed, and patient medications and                            allergies were reviewed. The patient's tolerance of                            previous anesthesia was also reviewed. The risks                            and benefits of the procedure and the sedation                            options and risks were discussed with the patient.                            All questions were answered, and informed consent                            was obtained. Prior Anticoagulants: The patient has                            taken no previous anticoagulant or antiplatelet                            agents. ASA Grade Assessment: II - A patient with                            mild systemic disease. After reviewing the risks                            and benefits, the patient was deemed in  satisfactory condition to undergo the procedure.                           After obtaining informed consent, the colonoscope                            was passed under direct vision. Throughout the                            procedure, the patient's blood pressure, pulse, and                            oxygen saturations were monitored continuously. The                            Colonoscope was introduced  through the anus and                            advanced to the the cecum, identified by                            appendiceal orifice and ileocecal valve. The                            ileocecal valve, appendiceal orifice, and rectum                            were photographed. The quality of the bowel                            preparation was excellent. The colonoscopy was                            performed without difficulty. The patient tolerated                            the procedure well. The bowel preparation used was                            SUPREP via split dose instruction. Scope In: 8:49:22 AM Scope Out: 9:06:52 AM Scope Withdrawal Time: 0 hours 14 minutes 46 seconds  Total Procedure Duration: 0 hours 17 minutes 30 seconds  Findings:                 Five polyps were found in the sigmoid colon,                            descending colon and ascending colon. The polyps                            were 1 to 2 mm in size. These polyps were removed                            with a cold snare. Resection and retrieval  were                            complete.                           Multiple diverticula were found in the left colon.                           Internal hemorrhoids were found during retroflexion.                           The exam was otherwise without abnormality on                            direct and retroflexion views. Complications:            No immediate complications. Estimated blood loss:                            None. Estimated Blood Loss:     Estimated blood loss: none. Impression:               - Five 1 to 2 mm polyps in the sigmoid colon, in                            the descending colon and in the ascending colon,                            removed with a cold snare. Resected and retrieved.                           - Diverticulosis in the left colon.                           - Internal hemorrhoids.                           - The  examination was otherwise normal on direct                            and retroflexion views. Recommendation:           - Repeat colonoscopy is not recommended for                            surveillance.                           - Patient has a contact number available for                            emergencies. The signs and symptoms of potential                            delayed complications were discussed with the  patient. Return to normal activities tomorrow.                            Written discharge instructions were provided to the                            patient.                           - Resume previous diet.                           - Continue present medications.                           - Await pathology results. Docia Chuck. Henrene Pastor, MD 12/24/2019 9:12:54 AM This report has been signed electronically.

## 2019-12-24 NOTE — Progress Notes (Signed)
PT taken to PACU. Monitors in place. VSS. Report given to RN. 

## 2019-12-24 NOTE — Progress Notes (Signed)
I have reviewed the patient's medical history in detail and updated the computerized patient record. Temp 96.2 V/s DT

## 2019-12-24 NOTE — Patient Instructions (Signed)
Handout on polyps and diverticulosis given    YOU HAD AN ENDOSCOPIC PROCEDURE TODAY AT THE [facility]:   Refer to the procedure report that was given to you for any specific questions about what was found during the examination.  If the procedure report does not answer your questions, please call your gastroenterologist to clarify.  If you requested that your care partner not be given the details of your procedure findings, then the procedure report has been included in a sealed envelope for you to review at your convenience later.  YOU SHOULD EXPECT: Some feelings of bloating in the abdomen. Passage of more gas than usual.  Walking can help get rid of the air that was put into your GI tract during the procedure and reduce the bloating. If you had a lower endoscopy (such as a colonoscopy or flexible sigmoidoscopy) you may notice spotting of blood in your stool or on the toilet paper. If you underwent a bowel prep for your procedure, you may not have a normal bowel movement for a few days.  Please Note:  You might notice some irritation and congestion in your nose or some drainage.  This is from the oxygen used during your procedure.  There is no need for concern and it should clear up in a day or so.  SYMPTOMS TO REPORT IMMEDIATELY:   Following lower endoscopy (colonoscopy or flexible sigmoidoscopy):  Excessive amounts of blood in the stool  Significant tenderness or worsening of abdominal pains  Swelling of the abdomen that is new, acute  Fever of 100F or higher   For urgent or emergent issues, a gastroenterologist can be reached at any hour by calling [phone]. Do not use MyChart messaging for urgent concerns.    DIET:  We do recommend a small meal at first, but then you may proceed to your regular diet.  Drink plenty of fluids but you should avoid alcoholic beverages for 24 hours.  ACTIVITY:  You should plan to take it easy for the rest of today and you should NOT  DRIVE or use heavy machinery until tomorrow (because of the sedation medicines used during the test).    FOLLOW UP: Our staff will call the number listed on your records 48-72 hours following your procedure to check on you and address any questions or concerns that you may have regarding the information given to you following your procedure. If we do not reach you, we will leave a message.  We will attempt to reach you two times.  During this call, we will ask if you have developed any symptoms of COVID 19. If you develop any symptoms (ie: fever, flu-like symptoms, shortness of breath, cough etc.) before then, please call [phone].  If you test positive for Covid 19 in the 2 weeks post procedure, please call and report this information to us.    If any biopsies were taken you will be contacted by phone or by letter within the next 1-3 weeks.  Please call us at [phone] if you have not heard about the biopsies in 3 weeks.    SIGNATURES/CONFIDENTIALITY: You and/or your care partner have signed paperwork which will be entered into your electronic medical record.  These signatures attest to the fact that that the information above on your After Visit Summary has been reviewed and is understood.  Full responsibility of the confidentiality of this discharge information lies with you and/or your care-partner. 

## 2019-12-24 NOTE — Progress Notes (Signed)
Called to room to assist during endoscopic procedure.  Patient ID and intended procedure confirmed with present staff. Received instructions for my participation in the procedure from the performing physician.  

## 2019-12-26 ENCOUNTER — Telehealth: Payer: Self-pay | Admitting: *Deleted

## 2019-12-26 ENCOUNTER — Encounter: Payer: Self-pay | Admitting: Internal Medicine

## 2019-12-26 NOTE — Telephone Encounter (Signed)
  Follow up Call-  Call back number 12/24/2019  Post procedure Call Back phone  # [phone]  Permission to leave phone message Yes  Some recent data might be hidden     Patient questions:  Do you have a fever, pain , or abdominal swelling? No. Pain Score  0 *  Have you tolerated food without any problems? Yes.    Have you been able to return to your normal activities? Yes.    Do you have any questions about your discharge instructions: Diet   No. Medications  No. Follow up visit  No.  Do you have questions or concerns about your Care? No.  Actions: * If pain score is 4 or above: No action needed, pain <4.  1. Have you developed a fever since your procedure? no  2.   Have you had an respiratory symptoms (SOB or cough) since your procedure? no  3.   Have you tested positive for COVID 19 since your procedure no  4.   Have you had any family members/close contacts diagnosed with the COVID 19 since your procedure?  no   If yes to any of these questions please route to Joylene John, RN and Erenest Rasher, RN

## 2020-01-07 ENCOUNTER — Ambulatory Visit: Payer: PPO | Admitting: Physician Assistant

## 2020-02-07 NOTE — Progress Notes (Addendum)
MEDICARE ANNUAL WELLNESS VISIT AND FOLLOW UP   Assessment:    Encounter for Medicare annual wellness exam UTD; requested have diabetes eye report forwarded   Essential hypertension Goal less than 130/80 Monitor blood pressure at home; call if consistently over 130/80 Continue DASH diet.   Reminder to go to the ER if any CP, SOB, nausea, dizziness, severe HA, changes vision/speech, left arm numbness and tingling and jaw pain.  Gastroesophageal reflux disease, esophagitis presence not specified Well managed on current medications Discussed diet, avoiding triggers and other lifestyle changes  Type 2 diabetes mellitus with stage 2 chronic kidney disease, without long-term current use of insulin ([place]) Discussed general issues about diabetes pathophysiology and management., Educational material distributed., Suggested low cholesterol diet., Encouraged aerobic exercise., Discussed foot care., Reminded to get yearly retinal exam, reports requested Continue metformin - A1C  CKD stage 2 due to type 2 diabetes mellitus ([facility]) Increase fluids, avoid NSAIDS, monitor sugars, will monitor  Benign prostatic hyperplasia with urinary frequency Stable on daily medications; check PSA annually  Erectile dysfunction asociated with T2DM ([place]) Continue PRN medication Control blood pressure, cholesterol, glucose, increase exercise.   Mixed hyperlipidemia associated with T2DM ([place]) discussed LDL goal <70 with diabetes Continue low cholesterol diet and exercise.   Overweight (BMI 25.0-29.9) Long discussion about weight loss, diet, and exercise Recommended diet heavy in fruits and veggies and low in animal meats, cheeses, and dairy products, appropriate calorie intake Discussed appropriate weight for height  Follow up at next visit  Vitamin D deficiency Near goal at recent check; continue to recommend supplementation for goal of 70-100 Defer vitamin D level  History of adenomatous polyp of  colon Had colonoscopy 12/2019, with polyps, DONE per GI   Over 40 minutes of exam, counseling, chart review and critical decision making was performed Future Appointments  Date Time Provider [place]  06/08/2020 10:30 AM Liane Comber, NP [facility] None  11/17/2020 10:00 AM Unk Pinto, MD [facility] None  02/22/2021  2:00 PM Liane Comber, NP [facility] None     Plan:   During the course of the visit the patient was educated and counseled about appropriate screening and preventive services including:    Pneumococcal vaccine   Prevnar 13  Influenza vaccine  Td vaccine  Screening electrocardiogram  Bone densitometry screening  Colorectal cancer screening  Diabetes screening  Glaucoma screening  Nutrition counseling   Advanced directives: requested   Subjective:  Edward [name] is a 79 y.o. male who presents for Medicare Annual Wellness Visit and 3 month follow up.   he has a diagnosis of GERD which is currently managed by Protonix 40 mg daily  he reports symptoms is currently well controlled, and denies breakthrough reflux, burning in chest, hoarseness or cough.    BMI is Body mass index is 25.99 kg/m., he has been working on diet and exercise, works taking care of 6 yards, also lifts weights at home.  Wt Readings from Last 3 Encounters:  02/11/20 176 lb (79.8 kg)  12/24/19 178 lb (80.7 kg)  12/10/19 178 lb (80.7 kg)   Former remote smoker, quit in 1969.   His blood pressure has been controlled at home, today their BP is BP: (!) 110/56  He does workout. He denies chest pain, shortness of breath, dizziness.   He is on cholesterol medication (lipitor 40 mg daily) and denies myalgias. His cholesterol is at goal. The cholesterol last visit was:   Lab Results  Component Value Date   CHOL 106  02/11/2020   HDL 48 02/11/2020   LDLCALC 44 02/11/2020   TRIG 55 02/11/2020   CHOLHDL 2.2 02/11/2020    He has been working on diet and exercise for  T2 diabetes with CKD III and ED (sildenafil), well controlled by metformin, and denies increased appetite, nausea, paresthesia of the feet, polydipsia, polyuria, visual disturbances, vomiting and weight loss.  He does check fasting glucose, ranges 85-125. Last A1C in the office was:  Lab Results  Component Value Date   HGBA1C 6.2 (H) 02/11/2020   He has CKD borderline II/III associated with T2DM monitored at this office:  Lab Results  Component Value Date   GFRNONAA 60 02/11/2020   Patient is on Vitamin D supplement and above goal at last check:    Lab Results  Component Value Date   VD25OH 103 (H) 11/06/2019      Medication Review: Current Outpatient Medications on File Prior to Visit  Medication Sig Dispense Refill  . APPLE CIDER VINEGAR PO Take by mouth.    Marland Kitchen aspirin 81 MG tablet Take 81 mg by mouth daily.    Marland Kitchen atorvastatin (LIPITOR) 40 MG tablet Take 1 tablet daily for Cholesterol 90 tablet 3  . Blood Glucose Monitoring Suppl DEVI Use device to test blood sugar once daily 1 each 0  . Cholecalciferol (VITAMIN D PO) Take 5,000 Units by mouth daily.    Marland Kitchen CINNAMON PO Take by mouth. Take 1-2 tablets daily    . Cyanocobalamin (VITAMIN B 12 PO) Take 1 tablet by mouth daily.    . famotidine (PEPCID) 40 MG tablet Take 1 tablet at Bedtime if needed for Heartburn & Reflux 90 tablet 0  . Lancets MISC Test blood sugar once daily 100 each 11  . metFORMIN (GLUCOPHAGE) 500 MG tablet Take 1 tablet with Breakfast & Lunch and 2 tablets with supper for Diabetes (Patient taking differently: Take 500 mg by mouth. Take one tablet three times a day) 360 tablet 3  . Omega-3 Fatty Acids (FISH OIL) 1000 MG CAPS Take 1 capsule by mouth daily.    . pantoprazole (PROTONIX) 40 MG tablet Take one tablet daily 90 tablet 1  . sildenafil (REVATIO) 20 MG tablet TAKE 1-5 TABLETS BY MOUTH ONE HOUR BEFORE NEEDED 90 tablet 3  . tamsulosin (FLOMAX) 0.4 MG CAPS capsule Take 1 capsule Daily for Prostate 90 capsule 3  .  zinc gluconate 50 MG tablet Take 50 mg by mouth daily.     No current facility-administered medications on file prior to visit.    Allergies  Allergen Reactions  . Bystolic [Nebivolol Hcl]     Current Problems (verified) Patient Active Problem List   Diagnosis Date Noted  . Former smoker, stopped smoking in distant past 09/02/2018  . FHx: heart disease 09/02/2018  . History of adenomatous polyp of colon 11/02/2017  . CKD stage 2 due to type 2 diabetes mellitus ([place]) 11/02/2017  . Type 2 diabetes with circulatory disorder causing erectile dysfunction ([place]) 11/01/2014  . Overweight (BMI 25.0-29.9) 07/25/2014  . Hyperlipidemia associated with type 2 diabetes mellitus ([place])   . Gastroesophageal reflux disease   . Essential hypertension   . Vitamin D deficiency   . ED (erectile dysfunction)   . BPH with obstruction/lower urinary tract symptoms     Screening Tests Immunization History  Administered Date(s) Administered  . DT (Pediatric) 04/08/2014  . Influenza, High Dose Seasonal PF 06/24/2014, 07/28/2015, 05/02/2016, 07/31/2017, 05/30/2018, 06/20/2019  . Influenza-Unspecified 07/02/2013  . PFIZER SARS-COV-2 Vaccination 10/03/2019,  10/24/2019  . Pneumococcal Conjugate-13 04/09/2015  . Pneumococcal-Unspecified 10/02/2007  . Td 10/02/2003  . Zoster 10/01/2006   Preventative care: Last colonoscopy: 12/2019, polyps, DONE per Dr. Henrene Pastor   Prior vaccinations: TD or Tdap: 2015  Influenza: 06/2019 Pneumococcal: 2009 Prevnar13: 2016  Shingles/Zostavax: 2008  Covid 19: 2/2, 2021, pfizer  Names of Other Physician/Practitioners you currently use: 1. [facility] here for primary care 2. [facility], Dr. Ellie Lunch, eye doctor, last visit July 2020, abstracted, has upcoming appointment scheduled  3.Dr. Worthy Rancher, dentist, last visit  2021 q 6 months  Patient Care Team: Unk Pinto, MD as PCP - General (Internal Medicine) Luberta Mutter, MD as Consulting Physician (Ophthalmology) Inda Castle, MD (Inactive) as Consulting Physician (Gastroenterology) Lucas Mallow, MD as Consulting Physician (Urology)  SURGICAL HISTORY He  has a past surgical history that includes Cataract extraction w/ intraocular lens implant (2013); Kidney stone surgery; Extracorporeal shock wave lithotripsy (Left, 12/11/2017); Colonoscopy (2013); Polypectomy; and Cataract extraction (Right, 2015). FAMILY HISTORY His family history includes Diabetes in his sister and sister; Heart disease in his mother; Hypertension in his sister and sister. SOCIAL HISTORY He  reports that he quit smoking about 52 years ago. He has a 5.00 pack-year smoking history. He has never used smokeless tobacco. He reports that he does not drink alcohol or use drugs.   MEDICARE WELLNESS OBJECTIVES: Physical activity: Current Exercise Habits: The patient has a physically strenuous job, but has no regular exercise apart from work., Exercise limited by: None identified Cardiac risk factors: Cardiac Risk Factors include: male gender;hypertension;dyslipidemia;advanced age (>31men, >38 women);diabetes mellitus;smoking/ tobacco exposure Depression/mood screen:   Depression screen [facility] 2/9 02/11/2020  Decreased Interest 0  Down, Depressed, Hopeless 0  PHQ - 2 Score 0    ADLs:  In your present state of health, do you have any difficulty performing the following activities: 02/11/2020 11/05/2019  Hearing? N Y  Comment - hearing aids  Vision? N N  Difficulty concentrating or making decisions? N N  Walking or climbing stairs? N N  Dressing or bathing? N N  Doing errands, shopping? N N  Some recent data might be hidden     Cognitive Testing  Alert? Yes  Normal Appearance?Yes  Oriented to person? Yes  Place? Yes   Time? Yes  Recall of three objects?  Yes  Can perform simple calculations? Yes  Displays appropriate judgment?Yes  Can read the correct time from a watch  face?Yes  EOL planning: Does Patient Have a Medical Advance Directive?: Yes Type of Advance Directive: Healthcare Power of Attorney, Living will Does patient want to make changes to medical advance directive?: No - Patient declined Copy of [place] in Chart?: No - copy requested  Review of Systems  Constitutional: Negative for malaise/fatigue and weight loss.  HENT: Negative for congestion, ear discharge, ear pain, hearing loss, sore throat and tinnitus.   Eyes: Negative for blurred vision and double vision.  Respiratory: Negative for cough, sputum production, shortness of breath and wheezing.   Cardiovascular: Negative for chest pain, palpitations, orthopnea, claudication, leg swelling and PND.  Gastrointestinal: Negative for abdominal pain, blood in stool, constipation, diarrhea, heartburn, melena, nausea and vomiting.  Genitourinary: Negative.   Musculoskeletal: Negative for falls, joint pain and myalgias.  Skin: Negative for rash.  Neurological: Negative for dizziness, tingling, sensory change, weakness and headaches.  Endo/Heme/Allergies: Negative for polydipsia.  Psychiatric/Behavioral: Negative.  Negative for depression, memory loss, substance abuse and suicidal ideas.  The patient is not nervous/anxious and does not have insomnia.   All other systems reviewed and are negative.    Objective:     Today's Vitals   02/11/20 1411  BP: (!) 110/56  Pulse: (!) 55  Temp: (!) 97.3 F (36.3 C)  SpO2: 97%  Weight: 176 lb (79.8 kg)  Height: 5\' 9"  (1.753 m)   Body mass index is 25.99 kg/m.  General appearance: alert, no distress, WD/WN, male HEENT: normocephalic, sclerae anicteric, bilateral external auditory canals clear, pharynx normal Oral cavity: MMM, no lesions Neck: supple, no lymphadenopathy, no thyromegaly, no masses Heart: RRR, normal S1, S2, no murmurs Lungs: CTA bilaterally, no wheezes, rhonchi, or rales Abdomen: +bs, soft, non tender, non  distended, no masses, no hepatomegaly, no splenomegaly Musculoskeletal: nontender, no swelling, no obvious deformity Extremities: no edema, no cyanosis, no clubbing Pulses: 2+ symmetric, upper and lower extremities, normal cap refill Neurological: alert, oriented x 3, CN2-12 intact, strength normal upper extremities and lower extremities, sensation normal throughout, DTRs 2+ throughout, no cerebellar signs, gait normal Psychiatric: normal affect, behavior normal, pleasant   Medicare Attestation I have personally reviewed: The patient's medical and social history Their use of alcohol, tobacco or illicit drugs Their current medications and supplements The patient's functional ability including ADLs,fall risks, home safety risks, cognitive, and hearing and visual impairment Diet and physical activities Evidence for depression or mood disorders  The patient's weight, height, BMI, and visual acuity have been recorded in the chart.  I have made referrals, counseling, and provided education to the patient based on review of the above and I have provided the patient with a written personalized care plan for preventive services.     Izora Ribas, NP   02/12/2020

## 2020-02-11 ENCOUNTER — Ambulatory Visit: Payer: PPO | Admitting: Physician Assistant

## 2020-02-11 ENCOUNTER — Ambulatory Visit (INDEPENDENT_AMBULATORY_CARE_PROVIDER_SITE_OTHER): Payer: PPO | Admitting: Adult Health

## 2020-02-11 ENCOUNTER — Encounter: Payer: Self-pay | Admitting: Adult Health

## 2020-02-11 ENCOUNTER — Other Ambulatory Visit: Payer: Self-pay

## 2020-02-11 VITALS — BP 110/56 | HR 55 | Temp 97.3°F | Ht 69.0 in | Wt 176.0 lb

## 2020-02-11 DIAGNOSIS — R6889 Other general symptoms and signs: Secondary | ICD-10-CM

## 2020-02-11 DIAGNOSIS — N521 Erectile dysfunction due to diseases classified elsewhere: Secondary | ICD-10-CM | POA: Diagnosis not present

## 2020-02-11 DIAGNOSIS — E1169 Type 2 diabetes mellitus with other specified complication: Secondary | ICD-10-CM | POA: Diagnosis not present

## 2020-02-11 DIAGNOSIS — Z0000 Encounter for general adult medical examination without abnormal findings: Secondary | ICD-10-CM

## 2020-02-11 DIAGNOSIS — E559 Vitamin D deficiency, unspecified: Secondary | ICD-10-CM | POA: Diagnosis not present

## 2020-02-11 DIAGNOSIS — N183 Chronic kidney disease, stage 3 unspecified: Secondary | ICD-10-CM | POA: Diagnosis not present

## 2020-02-11 DIAGNOSIS — I10 Essential (primary) hypertension: Secondary | ICD-10-CM

## 2020-02-11 DIAGNOSIS — Z0001 Encounter for general adult medical examination with abnormal findings: Secondary | ICD-10-CM

## 2020-02-11 DIAGNOSIS — N138 Other obstructive and reflux uropathy: Secondary | ICD-10-CM

## 2020-02-11 DIAGNOSIS — Z87891 Personal history of nicotine dependence: Secondary | ICD-10-CM

## 2020-02-11 DIAGNOSIS — Z86010 Personal history of colonic polyps: Secondary | ICD-10-CM | POA: Diagnosis not present

## 2020-02-11 DIAGNOSIS — K219 Gastro-esophageal reflux disease without esophagitis: Secondary | ICD-10-CM

## 2020-02-11 DIAGNOSIS — N401 Enlarged prostate with lower urinary tract symptoms: Secondary | ICD-10-CM

## 2020-02-11 DIAGNOSIS — E1159 Type 2 diabetes mellitus with other circulatory complications: Secondary | ICD-10-CM

## 2020-02-11 DIAGNOSIS — N200 Calculus of kidney: Secondary | ICD-10-CM

## 2020-02-11 DIAGNOSIS — E663 Overweight: Secondary | ICD-10-CM | POA: Diagnosis not present

## 2020-02-11 DIAGNOSIS — E785 Hyperlipidemia, unspecified: Secondary | ICD-10-CM

## 2020-02-11 DIAGNOSIS — E1122 Type 2 diabetes mellitus with diabetic chronic kidney disease: Secondary | ICD-10-CM

## 2020-02-11 HISTORY — DX: Calculus of kidney: N20.0

## 2020-02-11 MED ORDER — ONETOUCH VERIO VI STRP: ORAL_STRIP | 12 refills | Status: DC

## 2020-02-11 NOTE — Patient Instructions (Addendum)
  Mr. Edward [name] , Thank you for taking time to come for your Medicare Wellness Visit. I appreciate your ongoing commitment to your health goals. Please review the following plan we discussed and let me know if I can assist you in the future.   These are the goals we discussed: Goals    . Blood Pressure < 130/80          . check sugars daily (pt-stated)     Goal fasting sugar less than 120     . DIET - INCREASE WATER INTAKE     Aim for 65+ fluid ounces, or 4-5 bottles of water daily        This is a list of the screening recommended for you and due dates:  Health Maintenance  Topic Date Due  . Eye exam for diabetics  03/18/2020  . Flu Shot  04/12/2020  . Hemoglobin A1C  05/05/2020  . Complete foot exam   11/04/2020  . Urine Protein Check  11/05/2020  . Tetanus Vaccine  04/08/2024  . COVID-19 Vaccine  Completed  . Pneumonia vaccines  Completed

## 2020-02-12 ENCOUNTER — Encounter: Payer: Self-pay | Admitting: Adult Health

## 2020-02-12 LAB — COMPLETE METABOLIC PANEL WITH GFR
AG Ratio: 2 (calc) (ref 1.0–2.5)
ALT: 12 U/L (ref 9–46)
AST: 10 U/L (ref 10–35)
Albumin: 4 g/dL (ref 3.6–5.1)
Alkaline phosphatase (APISO): 75 U/L (ref 35–144)
BUN: 15 mg/dL (ref 7–25)
CO2: 30 mmol/L (ref 20–32)
Calcium: 9.1 mg/dL (ref 8.6–10.3)
Chloride: 106 mmol/L (ref 98–110)
Creat: 1.17 mg/dL (ref 0.70–1.18)
GFR, Est African American: 69 mL/min/{1.73_m2} (ref >=60)
GFR, Est Non African American: 60 mL/min/{1.73_m2} (ref >=60)
Globulin: 2 g/dL (calc) (ref 1.9–3.7)
Glucose, Bld: 151 mg/dL — ABNORMAL HIGH (ref 65–99)
Potassium: 4.2 mmol/L (ref 3.5–5.3)
Sodium: 142 mmol/L (ref 135–146)
Total Bilirubin: 0.6 mg/dL (ref 0.2–1.2)
Total Protein: 6 g/dL — ABNORMAL LOW (ref 6.1–8.1)

## 2020-02-12 LAB — CBC WITH DIFFERENTIAL/PLATELET
Absolute Monocytes: 421 cells/uL (ref 200–950)
Basophils Absolute: 21 cells/uL (ref 0–200)
Basophils Relative: 0.4 %
Eosinophils Absolute: 88 cells/uL (ref 15–500)
Eosinophils Relative: 1.7 %
HCT: 44 % (ref 38.5–50.0)
Hemoglobin: 14.2 g/dL (ref 13.2–17.1)
Lymphs Abs: 1232 cells/uL (ref 850–3900)
MCH: 29.2 pg (ref 27.0–33.0)
MCHC: 32.3 g/dL (ref 32.0–36.0)
MCV: 90.3 fL (ref 80.0–100.0)
MPV: 11.9 fL (ref 7.5–12.5)
Monocytes Relative: 8.1 %
Neutro Abs: 3437 cells/uL (ref 1500–7800)
Neutrophils Relative %: 66.1 %
Platelets: 155 10*3/uL (ref 140–400)
RBC: 4.87 10*6/uL (ref 4.20–5.80)
RDW: 12.9 % (ref 11.0–15.0)
Total Lymphocyte: 23.7 %
WBC: 5.2 10*3/uL (ref 3.8–10.8)

## 2020-02-12 LAB — HEMOGLOBIN A1C
Hgb A1c MFr Bld: 6.2 % of total Hgb — ABNORMAL HIGH (ref <5.7)
Mean Plasma Glucose: 131 (calc)
eAG (mmol/L): 7.3 (calc)

## 2020-02-12 LAB — MAGNESIUM: Magnesium: 1.9 mg/dL (ref 1.5–2.5)

## 2020-02-12 LAB — LIPID PANEL
Cholesterol: 106 mg/dL (ref <200)
HDL: 48 mg/dL (ref >=40)
LDL Cholesterol (Calc): 44 mg/dL (calc)
Non-HDL Cholesterol (Calc): 58 mg/dL (calc) (ref <130)
Total CHOL/HDL Ratio: 2.2 (calc) (ref <5.0)
Triglycerides: 55 mg/dL (ref <150)

## 2020-02-12 LAB — TSH: TSH: 1.02 mIU/L (ref 0.40–4.50)

## 2020-03-29 ENCOUNTER — Other Ambulatory Visit: Payer: Self-pay | Admitting: Adult Health

## 2020-03-29 DIAGNOSIS — K219 Gastro-esophageal reflux disease without esophagitis: Secondary | ICD-10-CM

## 2020-04-28 DIAGNOSIS — D1801 Hemangioma of skin and subcutaneous tissue: Secondary | ICD-10-CM | POA: Diagnosis not present

## 2020-04-28 DIAGNOSIS — D225 Melanocytic nevi of trunk: Secondary | ICD-10-CM | POA: Diagnosis not present

## 2020-04-28 DIAGNOSIS — L821 Other seborrheic keratosis: Secondary | ICD-10-CM | POA: Diagnosis not present

## 2020-05-06 DIAGNOSIS — E119 Type 2 diabetes mellitus without complications: Secondary | ICD-10-CM | POA: Diagnosis not present

## 2020-05-06 DIAGNOSIS — Z961 Presence of intraocular lens: Secondary | ICD-10-CM | POA: Diagnosis not present

## 2020-05-06 DIAGNOSIS — D23121 Other benign neoplasm of skin of left upper eyelid, including canthus: Secondary | ICD-10-CM | POA: Diagnosis not present

## 2020-05-06 DIAGNOSIS — H524 Presbyopia: Secondary | ICD-10-CM | POA: Diagnosis not present

## 2020-05-06 LAB — HM DIABETES EYE EXAM

## 2020-05-11 DIAGNOSIS — D23121 Other benign neoplasm of skin of left upper eyelid, including canthus: Secondary | ICD-10-CM | POA: Diagnosis not present

## 2020-05-25 ENCOUNTER — Other Ambulatory Visit: Payer: Self-pay | Admitting: Internal Medicine

## 2020-05-25 DIAGNOSIS — E1122 Type 2 diabetes mellitus with diabetic chronic kidney disease: Secondary | ICD-10-CM

## 2020-06-05 NOTE — Progress Notes (Signed)
3 MONTH FOLLOW UP   Assessment:    Essential hypertension Goal less than 130/80 Monitor blood pressure at home; call if consistently over 130/80 Continue DASH diet.   Reminder to go to the ER if any CP, SOB, nausea, dizziness, severe HA, changes vision/speech, left arm numbness and tingling and jaw pain.  Type 2 diabetes mellitus with stage 2 chronic kidney disease, without long-term current use of insulin ([place]) Discussed general issues about diabetes pathophysiology and management., Educational material distributed., Suggested low cholesterol diet., Encouraged aerobic exercise., Discussed foot care., Reminded to get yearly retinal exam, reports requested Continue metformin - A1C  CKD stage 2 due to type 2 diabetes mellitus ([place]) Borderline stage 2/3 Not on ACE/ARB due to low BPs; has been stable Increase fluids, avoid NSAIDS, monitor sugars, will monitor  Erectile dysfunction asociated with T2DM ([place]) Continue PRN medication Control blood pressure, cholesterol, glucose, increase exercise.   Mixed hyperlipidemia associated with T2DM ([place]) discussed LDL goal <70 with diabetes Consider reducing atorvastatin if remains aggressively controlled Continue low cholesterol diet and exercise.   Overweight (BMI 25.0-29.9) Long discussion about weight loss, diet, and exercise Recommended diet heavy in fruits and veggies and low in animal meats, cheeses, and dairy products, appropriate calorie intake Discussed appropriate weight for height  Follow up at next visit  Vitamin D deficiency Essentially at recent check;  continue to recommend supplementation for goal of 70-100 Defer vitamin D level  Rhinitis Allergies vs mild viral URI Discussed the importance of avoiding unnecessary antibiotic therapy. Suggested symptomatic OTC remedies. Nasal saline spray for congestion. Nasal steroids, allergy pill Get covid 19 drive through testing if antihistamine doesn't help Follow up as  needed.  Over 30 minutes of exam, counseling, chart review and critical decision making was performed Future Appointments  Date Time Provider [place]  11/17/2020 10:00 AM Unk Pinto, MD [facility] None  02/22/2021  2:00 PM Liane Comber, NP [facility] None     Subjective:  Edward [name] is a 79 y.o. male who presents for and 3 month follow up - T2DM with CKD, htn/hld, weight, vitamin D def, GERD.   he has a diagnosis of GERD which is currently well controlled by Protonix 40 mg daily   BMI is Body mass index is 25.84 kg/m., he has been working on diet and exercise, works taking care of 6 yards, also lifts weights at home.  Wt Readings from Last 3 Encounters:  06/08/20 175 lb (79.4 kg)  02/11/20 176 lb (79.8 kg)  12/24/19 178 lb (80.7 kg)   Former remote smoker, quit in 1969.   His blood pressure has been controlled at home, today their BP is BP: 120/64  He does workout. He denies chest pain, shortness of breath, dizziness.   He is on cholesterol medication (lipitor 40 mg daily) and denies myalgias. His cholesterol is at goal. The cholesterol last visit was:   Lab Results  Component Value Date   CHOL 106 02/11/2020   HDL 48 02/11/2020   LDLCALC 44 02/11/2020   TRIG 55 02/11/2020   CHOLHDL 2.2 02/11/2020    He has been working on diet and exercise for T2 diabetes with CKD II/III and ED (sildenafil), well controlled by metformin 500 mg TID, and denies increased appetite, nausea, paresthesia of the feet, polydipsia, polyuria, visual disturbances, vomiting and weight loss.  He does check fasting glucose, ranges 111-118, did have a few lows, 68 this am with some shaking. Last A1C in the office was:  Lab Results  Component Value Date   HGBA1C 6.2 (H) 02/11/2020   He has CKD borderline II/III associated with T2DM monitored at this office, not on ace/arb, due to low BPs:  Lab Results  Component Value Date   GFRNONAA 60 02/11/2020   Patient is on Vitamin D  supplement and mildly above goal at last check, he is unsure how much taking, "2 caps":    Lab Results  Component Value Date   VD25OH 103 (H) 11/06/2019      Medication Review: Current Outpatient Medications on File Prior to Visit  Medication Sig Dispense Refill  . APPLE CIDER VINEGAR PO Take by mouth.    Marland Kitchen aspirin 81 MG tablet Take 81 mg by mouth daily.    Marland Kitchen atorvastatin (LIPITOR) 40 MG tablet Take 1 tablet daily for Cholesterol 90 tablet 3  . Blood Glucose Monitoring Suppl DEVI Use device to test blood sugar once daily 1 each 0  . Cholecalciferol (VITAMIN D PO) Take 5,000 Units by mouth daily.    Marland Kitchen CINNAMON PO Take by mouth. Take 1-2 tablets daily    . Cyanocobalamin (VITAMIN B 12 PO) Take 1 tablet by mouth daily.    . famotidine (PEPCID) 40 MG tablet Take 1 tablet   at Bedtime   if needed to Prevent Heartburn & Reflux 90 tablet 0  . glucose blood (ONETOUCH VERIO) test strip Test blood sugar once daily 100 each 12  . Lancets MISC Test blood sugar once daily 100 each 11  . metFORMIN (GLUCOPHAGE) 500 MG tablet Take 1 tablet (500 mg total) by mouth with breakfast, with lunch, and with evening meal. Take one tablet three times a day 270 tablet 0  . Omega-3 Fatty Acids (FISH OIL) 1000 MG CAPS Take 1 capsule by mouth daily.    . pantoprazole (PROTONIX) 40 MG tablet Take 1 tablet every Morning to Prevent Heartburn  Reflux 90 tablet 0  . sildenafil (REVATIO) 20 MG tablet TAKE 1-5 TABLETS BY MOUTH ONE HOUR BEFORE NEEDED 90 tablet 3  . tamsulosin (FLOMAX) 0.4 MG CAPS capsule Take 1 capsule Daily for Prostate 90 capsule 3  . zinc gluconate 50 MG tablet Take 50 mg by mouth daily.     No current facility-administered medications on file prior to visit.    Allergies  Allergen Reactions  . Bystolic [Nebivolol Hcl]     Current Problems (verified) Patient Active Problem List   Diagnosis Date Noted  . Former smoker, stopped smoking in distant past 09/02/2018  . FHx: heart disease 09/02/2018  .  History of adenomatous polyp of colon 11/02/2017  . CKD stage 2 due to type 2 diabetes mellitus ([place]) 11/02/2017  . Type 2 diabetes with circulatory disorder causing erectile dysfunction ([place]) 11/01/2014  . Overweight (BMI 25.0-29.9) 07/25/2014  . Hyperlipidemia associated with type 2 diabetes mellitus ([place])   . Gastroesophageal reflux disease   . Essential hypertension   . Vitamin D deficiency   . BPH with obstruction/lower urinary tract symptoms    SURGICAL HISTORY He  has a past surgical history that includes Cataract extraction w/ intraocular lens implant (2013); Kidney stone surgery; Extracorporeal shock wave lithotripsy (Left, 12/11/2017); Colonoscopy (2013); Polypectomy; and Cataract extraction (Right, 2015). FAMILY HISTORY His family history includes Diabetes in his sister and sister; Heart disease in his mother; Hypertension in his sister and sister. SOCIAL HISTORY He  reports that he quit smoking about 52 years ago. He has a 5.00 pack-year smoking history. He has never used smokeless tobacco. He reports  that he does not drink alcohol and does not use drugs.   Review of Systems  Constitutional: Negative for malaise/fatigue and weight loss.  HENT: Negative for congestion, ear discharge, ear pain, hearing loss, sore throat and tinnitus.        Mild running nose  Eyes: Negative for blurred vision and double vision.  Respiratory: Positive for cough (mild, nonproductive started last night). Negative for sputum production, shortness of breath and wheezing.   Cardiovascular: Negative for chest pain, palpitations, orthopnea, claudication, leg swelling and PND.  Gastrointestinal: Negative for abdominal pain, blood in stool, constipation, diarrhea, heartburn, melena, nausea and vomiting.  Genitourinary: Negative.   Musculoskeletal: Negative for falls, joint pain and myalgias.  Skin: Negative for rash.  Neurological: Negative for dizziness, tingling, sensory change, weakness and headaches.   Endo/Heme/Allergies: Negative for polydipsia.  Psychiatric/Behavioral: Negative.  Negative for depression, memory loss, substance abuse and suicidal ideas. The patient is not nervous/anxious and does not have insomnia.   All other systems reviewed and are negative.    Objective:     Today's Vitals   06/08/20 1015  BP: 120/64  Pulse: (!) 58  Temp: 97.7 F (36.5 C)  SpO2: 96%  Weight: 175 lb (79.4 kg)  Height: 5\' 9"  (1.753 m)   Body mass index is 25.84 kg/m.  General appearance: alert, no distress, WD/WN, male HEENT: normocephalic, sclerae anicteric, bilateral external auditory canals clear, pharynx normal Oral cavity: MMM, no lesions Neck: supple, no lymphadenopathy, no thyromegaly, no masses Heart: RRR, normal S1, S2, no murmurs Lungs: CTA bilaterally, no wheezes, rhonchi, or rales Abdomen: +bs, soft, non tender, non distended, no masses, no hepatomegaly, no splenomegaly Musculoskeletal: nontender, no swelling, no obvious deformity Extremities: no edema, no cyanosis, no clubbing Pulses: 2+ symmetric, upper and lower extremities, normal cap refill Neurological: alert, oriented x 3, CN2-12 intact, strength normal upper extremities and lower extremities, sensation normal throughout, DTRs 2+ throughout, no cerebellar signs, gait normal Psychiatric: normal affect, behavior normal, pleasant     Edward Ribas, NP   06/08/2020

## 2020-06-08 ENCOUNTER — Other Ambulatory Visit: Payer: Self-pay

## 2020-06-08 ENCOUNTER — Ambulatory Visit (INDEPENDENT_AMBULATORY_CARE_PROVIDER_SITE_OTHER): Payer: PPO | Admitting: Adult Health

## 2020-06-08 ENCOUNTER — Encounter: Payer: Self-pay | Admitting: Adult Health

## 2020-06-08 VITALS — BP 120/64 | HR 58 | Temp 97.7°F | Ht 69.0 in | Wt 175.0 lb

## 2020-06-08 DIAGNOSIS — N182 Chronic kidney disease, stage 2 (mild): Secondary | ICD-10-CM

## 2020-06-08 DIAGNOSIS — I10 Essential (primary) hypertension: Secondary | ICD-10-CM

## 2020-06-08 DIAGNOSIS — N521 Erectile dysfunction due to diseases classified elsewhere: Secondary | ICD-10-CM

## 2020-06-08 DIAGNOSIS — E559 Vitamin D deficiency, unspecified: Secondary | ICD-10-CM

## 2020-06-08 DIAGNOSIS — E1169 Type 2 diabetes mellitus with other specified complication: Secondary | ICD-10-CM

## 2020-06-08 DIAGNOSIS — E663 Overweight: Secondary | ICD-10-CM | POA: Diagnosis not present

## 2020-06-08 DIAGNOSIS — E1122 Type 2 diabetes mellitus with diabetic chronic kidney disease: Secondary | ICD-10-CM | POA: Diagnosis not present

## 2020-06-08 DIAGNOSIS — E785 Hyperlipidemia, unspecified: Secondary | ICD-10-CM

## 2020-06-08 DIAGNOSIS — Z23 Encounter for immunization: Secondary | ICD-10-CM | POA: Diagnosis not present

## 2020-06-08 DIAGNOSIS — E1159 Type 2 diabetes mellitus with other circulatory complications: Secondary | ICD-10-CM

## 2020-06-08 NOTE — Addendum Note (Signed)
Addended by: Chancy Hurter on: 06/08/2020 11:55 AM   Modules accepted: Orders

## 2020-06-08 NOTE — Patient Instructions (Signed)
Goals    .  Blood Pressure < 130/80           .  check sugars daily (pt-stated)      Goal fasting sugar less than 120     .  DIET - INCREASE WATER INTAKE      Aim for 65+ fluid ounces, or 4-5 bottles of water daily         Suggest getting on claritin or other allergy med - could have virus from grand kids or allergies  If getting worse, get drive through covid 19 testing and stay at home until ruled out    [place]:  -Symptoms usually last at least 1 week with the worst symptoms being around day 4.  - colds usually start with a sore throat and end with a cough, and the cough can take 2 weeks to get better.  -No antibiotics are needed for colds, flu, sore throats, cough, bronchitis UNLESS symptoms are longer than 7 days OR if you are getting better then get drastically worse.  -There are a lot of combination medications (Dayquil, Nyquil, Vicks 44, tyelnol cold and sinus, ETC). Please look at the ingredients on the back so that you are treating the correct symptoms and not doubling up on medications/ingredients.    Medicines you can use  Nasal congestion  Little Remedies saline spray (aerosol/mist)- can try this, it is in the kids section - pseudoephedrine (Sudafed)- behind the counter, do not use if you have high blood pressure, medicine that have -D in them.  - phenylephrine (Sudafed PE) -Dextormethorphan + chlorpheniramine (Coridcidin HBP)- okay if you have high blood pressure -Oxymetazoline (Afrin) nasal spray- LIMIT to 3 days -Saline nasal spray -Neti pot (used distilled or bottled water)  Ear pain/congestion  -pseudoephedrine (sudafed) - Nasonex/flonase nasal spray  Fever  -Acetaminophen (Tyelnol) -Ibuprofen (Advil, motrin, aleve)  Sore Throat  -Acetaminophen (Tyelnol) -Ibuprofen (Advil, motrin, aleve) -Drink a lot of water -Gargle with salt water - Rest your voice (don't talk) -Throat sprays -Cough drops  Body  Aches  -Acetaminophen (Tyelnol) -Ibuprofen (Advil, motrin, aleve)  Headache  -Acetaminophen (Tyelnol) -Ibuprofen (Advil, motrin, aleve) - Exedrin, Exedrin Migraine  Allergy symptoms (cough, sneeze, runny nose, itchy eyes) -Claritin or loratadine cheapest but likely the weakest  -Zyrtec or certizine at night because it can make you sleepy -The strongest is allegra or fexafinadine  Cheapest at [employer], [employer], [employer]  Cough  -Dextromethorphan (Delsym)- medicine that has DM in it -Guafenesin (Mucinex/Robitussin) - cough drops - drink lots of water  Chest Congestion  -Guafenesin (Mucinex/Robitussin)  Red Itchy Eyes  - Naphcon-A  Upset Stomach  - Bland diet (nothing spicy, greasy, fried, and high acid foods like tomatoes, oranges, berries) -OKAY- cereal, bread, soup, crackers, rice -Eat smaller more frequent meals -reduce caffeine, no alcohol -Loperamide (Imodium-AD) if diarrhea -Prevacid for heart burn  General health when sick  -Hydration -wash your hands frequently -keep surfaces clean -change pillow cases and sheets often -Get fresh air but do not exercise strenuously -Vitamin D, double up on it - Vitamin C -Zinc

## 2020-06-09 LAB — LIPID PANEL
Cholesterol: 117 mg/dL (ref <200)
HDL: 48 mg/dL (ref >=40)
LDL Cholesterol (Calc): 56 mg/dL (calc)
Non-HDL Cholesterol (Calc): 69 mg/dL (calc) (ref <130)
Total CHOL/HDL Ratio: 2.4 (calc) (ref <5.0)
Triglycerides: 52 mg/dL (ref <150)

## 2020-06-09 LAB — COMPLETE METABOLIC PANEL WITH GFR
AG Ratio: 2.1 (calc) (ref 1.0–2.5)
ALT: 12 U/L (ref 9–46)
AST: 9 U/L — ABNORMAL LOW (ref 10–35)
Albumin: 4.1 g/dL (ref 3.6–5.1)
Alkaline phosphatase (APISO): 65 U/L (ref 35–144)
BUN/Creatinine Ratio: 13 (calc) (ref 6–22)
BUN: 16 mg/dL (ref 7–25)
CO2: 31 mmol/L (ref 20–32)
Calcium: 9.4 mg/dL (ref 8.6–10.3)
Chloride: 106 mmol/L (ref 98–110)
Creat: 1.2 mg/dL — ABNORMAL HIGH (ref 0.70–1.18)
GFR, Est African American: 67 mL/min/{1.73_m2} (ref >=60)
GFR, Est Non African American: 58 mL/min/{1.73_m2} — ABNORMAL LOW (ref >=60)
Globulin: 2 g/dL (calc) (ref 1.9–3.7)
Glucose, Bld: 143 mg/dL — ABNORMAL HIGH (ref 65–99)
Potassium: 4.3 mmol/L (ref 3.5–5.3)
Sodium: 142 mmol/L (ref 135–146)
Total Bilirubin: 0.5 mg/dL (ref 0.2–1.2)
Total Protein: 6.1 g/dL (ref 6.1–8.1)

## 2020-06-09 LAB — CBC WITH DIFFERENTIAL/PLATELET
Absolute Monocytes: 392 cells/uL (ref 200–950)
Basophils Absolute: 20 cells/uL (ref 0–200)
Basophils Relative: 0.4 %
Eosinophils Absolute: 39 cells/uL (ref 15–500)
Eosinophils Relative: 0.8 %
HCT: 45.1 % (ref 38.5–50.0)
Hemoglobin: 15 g/dL (ref 13.2–17.1)
Lymphs Abs: 1107 cells/uL (ref 850–3900)
MCH: 30.3 pg (ref 27.0–33.0)
MCHC: 33.3 g/dL (ref 32.0–36.0)
MCV: 91.1 fL (ref 80.0–100.0)
MPV: 12 fL (ref 7.5–12.5)
Monocytes Relative: 8 %
Neutro Abs: 3342 cells/uL (ref 1500–7800)
Neutrophils Relative %: 68.2 %
Platelets: 148 10*3/uL (ref 140–400)
RBC: 4.95 10*6/uL (ref 4.20–5.80)
RDW: 12.7 % (ref 11.0–15.0)
Total Lymphocyte: 22.6 %
WBC: 4.9 10*3/uL (ref 3.8–10.8)

## 2020-06-09 LAB — HEMOGLOBIN A1C
Hgb A1c MFr Bld: 6.5 % of total Hgb — ABNORMAL HIGH (ref <5.7)
Mean Plasma Glucose: 140 (calc)
eAG (mmol/L): 7.7 (calc)

## 2020-06-09 LAB — VITAMIN D 25 HYDROXY (VIT D DEFICIENCY, FRACTURES): Vit D, 25-Hydroxy: 97 ng/mL (ref 30–100)

## 2020-06-09 LAB — TSH: TSH: 1.68 mIU/L (ref 0.40–4.50)

## 2020-06-09 LAB — MAGNESIUM: Magnesium: 1.9 mg/dL (ref 1.5–2.5)

## 2020-06-10 ENCOUNTER — Other Ambulatory Visit: Payer: Self-pay | Admitting: Internal Medicine

## 2020-06-16 ENCOUNTER — Encounter: Payer: Self-pay | Admitting: Internal Medicine

## 2020-06-21 ENCOUNTER — Other Ambulatory Visit: Payer: Self-pay | Admitting: Internal Medicine

## 2020-06-30 ENCOUNTER — Encounter: Payer: Self-pay | Admitting: *Deleted

## 2020-07-16 ENCOUNTER — Other Ambulatory Visit: Payer: Self-pay | Admitting: Internal Medicine

## 2020-07-29 ENCOUNTER — Other Ambulatory Visit: Payer: Self-pay | Admitting: Internal Medicine

## 2020-07-29 DIAGNOSIS — K219 Gastro-esophageal reflux disease without esophagitis: Secondary | ICD-10-CM

## 2020-07-30 ENCOUNTER — Ambulatory Visit: Payer: PPO

## 2020-07-30 ENCOUNTER — Ambulatory Visit
Admission: EM | Admit: 2020-07-30 | Discharge: 2020-07-30 | Disposition: A | Discharge status: Home / Self Care | Payer: PPO | Attending: Family Medicine | Admitting: Family Medicine

## 2020-07-30 ENCOUNTER — Ambulatory Visit (INDEPENDENT_AMBULATORY_CARE_PROVIDER_SITE_OTHER): Payer: PPO

## 2020-07-30 DIAGNOSIS — S6991XA Unspecified injury of right wrist, hand and finger(s), initial encounter: Secondary | ICD-10-CM | POA: Diagnosis not present

## 2020-07-30 DIAGNOSIS — W208XXA Other cause of strike by thrown, projected or falling object, initial encounter: Secondary | ICD-10-CM | POA: Diagnosis not present

## 2020-07-30 DIAGNOSIS — S61411A Laceration without foreign body of right hand, initial encounter: Secondary | ICD-10-CM | POA: Diagnosis not present

## 2020-07-30 DIAGNOSIS — S61412A Laceration without foreign body of left hand, initial encounter: Secondary | ICD-10-CM | POA: Diagnosis not present

## 2020-07-30 NOTE — ED Provider Notes (Signed)
[facility]    CSN: [id number] Arrival date & time: 07/30/20  1146      History   Chief Complaint Chief Complaint  Patient presents with  . Hand Injury    bilat    HPI Edward [name] is a 79 y.o. male.   Patient is a 79 year old male that presents today with right hand injury.  Reporting he was helping lift something last night and something fell on his hand. Possibly a piece of wood. He has skin tear to the top of the right hand with bleeding controlled.  Wife cleaned last night and wrapped using antibiotic ointment and pressure dressing.  Patient is not any blood thinners.  Good range of motion.     Past Medical History:  Diagnosis Date  . BPH (benign prostatic hyperplasia)   . Cataract    surgical removal bilateral  . Diabetes mellitus without complication ([place])   . ED (erectile dysfunction)   . GERD (gastroesophageal reflux disease)   . History of kidney stones   . Hyperlipidemia   . Hypertension   . IBS (irritable bowel syndrome)   . Kidney stones 02/11/2020  . LBBB (left bundle branch block)   . Personal history of kidney stones   . Pre-diabetes   . Prediabetes   . Vitamin D deficiency     Patient Active Problem List   Diagnosis Date Noted  . Former smoker, stopped smoking in distant past 09/02/2018  . FHx: heart disease 09/02/2018  . History of adenomatous polyp of colon 11/02/2017  . CKD stage 2 due to type 2 diabetes mellitus ([place]) 11/02/2017  . Type 2 diabetes with circulatory disorder causing erectile dysfunction ([place]) 11/01/2014  . Overweight (BMI 25.0-29.9) 07/25/2014  . Hyperlipidemia associated with type 2 diabetes mellitus ([place])   . Gastroesophageal reflux disease   . Essential hypertension   . Vitamin D deficiency   . BPH with obstruction/lower urinary tract symptoms     Past Surgical History:  Procedure Laterality Date  . CATARACT EXTRACTION Right 2015  . CATARACT EXTRACTION W/ INTRAOCULAR LENS IMPLANT  2013   left  .  COLONOSCOPY  2013   TA  . EXTRACORPOREAL SHOCK WAVE LITHOTRIPSY Left 12/11/2017   Procedure: LEFT EXTRACORPOREAL SHOCK WAVE LITHOTRIPSY (ESWL);  Surgeon: Lucas Mallow, MD;  Location: [facility];  Service: Urology;  Laterality: Left;  . KIDNEY STONE SURGERY    . POLYPECTOMY         Home Medications    Prior to Admission medications   Medication Sig Start Date End Date Taking? Authorizing Provider  APPLE CIDER VINEGAR PO Take by mouth.    [provider]  aspirin 81 MG tablet Take 81 mg by mouth daily.    [provider]  atorvastatin (LIPITOR) 40 MG tablet Take    1 tablet    Daily    for Cholesterol 06/21/20   Unk Pinto, MD  Blood Glucose Monitoring Suppl DEVI Use device to test blood sugar once daily 03/21/19   Liane Comber, NP  Cholecalciferol (VITAMIN D PO) Take 5,000 Units by mouth daily.    [provider]  CINNAMON PO Take by mouth. Take 1-2 tablets daily    [provider]  Cyanocobalamin (VITAMIN B 12 PO) Take 1 tablet by mouth daily.    [provider]  famotidine (PEPCID) 40 MG tablet Take      1 tablet       at Bedtime  to Prevent Heartburn & Indigestion 07/29/20   Unk Pinto, MD  glucose blood [facility] VERIO) test strip Test blood sugar once daily 02/11/20   Liane Comber, NP  Lancets MISC Test blood sugar once daily 03/25/19   Unk Pinto, MD  metFORMIN (GLUCOPHAGE) 500 MG tablet Take 1 tablet (500 mg total) by mouth with breakfast, with lunch, and with evening meal. Take one tablet three times a day 05/25/20   Liane Comber, NP  Omega-3 Fatty Acids (FISH OIL) 1000 MG CAPS Take 1 capsule by mouth daily.    [provider]  pantoprazole (PROTONIX) 40 MG tablet Take      1 tablet     Daily       to Prevent Indigestion & Heartburn 06/10/20   Unk Pinto, MD  sildenafil (REVATIO) 20 MG tablet TAKE 1-5 TABLETS BY MOUTH ONE HOUR BEFORE NEEDED 11/06/18   Unk Pinto, MD  tamsulosin (FLOMAX) 0.4  MG CAPS capsule TAKE 1 CAPSULE BY MOUTH ONCE DAILY FOR PROSTATE 07/16/20   Garnet Sierras, NP  zinc gluconate 50 MG tablet Take 50 mg by mouth daily.    [provider]    Family History Family History  Problem Relation Age of Onset  . Diabetes Sister   . Hypertension Sister   . Heart disease Mother   . Diabetes Sister   . Hypertension Sister   . Colon cancer Neg Hx   . Colon polyps Neg Hx   . Esophageal cancer Neg Hx   . Rectal cancer Neg Hx   . Stomach cancer Neg Hx     Social History Social History   Tobacco Use  . Smoking status: Former Smoker    Packs/day: 0.50    Years: 10.00    Pack years: 5.00    Quit date: 09/13/1967    Years since quitting: 52.9  . Smokeless tobacco: Never Used  Vaping Use  . Vaping Use: Never used  Substance Use Topics  . Alcohol use: No  . Drug use: No     Allergies   Bystolic [nebivolol hcl]   Review of Systems Review of Systems   Physical Exam Triage Vital Signs ED Triage Vitals [07/30/20 1219]  Enc Vitals Group     BP (!) 169/69     Pulse Rate 70     Resp 16     Temp 98.7 F (37.1 C)     Temp Source Oral     SpO2 94 %     Weight      Height      Head Circumference      Peak Flow      Pain Score      Pain Loc      Pain Edu?      Excl. in [place]?    No data found.  Updated Vital Signs BP (!) 169/69 (BP Location: Left Arm)   Pulse 70   Temp 98.7 F (37.1 C) (Oral)   Resp 16   Wt 172 lb (78 kg)   SpO2 94%   BMI 25.40 kg/m   Visual Acuity Right Eye Distance:   Left Eye Distance:   Bilateral Distance:    Right Eye Near:   Left Eye Near:    Bilateral Near:     Physical Exam Vitals and nursing note reviewed.  Constitutional:      Appearance: Normal appearance.  HENT:     Head: Normocephalic and atraumatic.  Eyes:     Conjunctiva/sclera: Conjunctivae normal.  Pulmonary:  Effort: Pulmonary effort is normal.  Musculoskeletal:        General: Normal range of motion.     Left hand: Swelling,  tenderness and bony tenderness present. No lacerations. Normal range of motion.       Hands:     Cervical back: Normal range of motion.     Comments: Skin tear, bleeding controlled. TTP   Skin:    General: Skin is warm and dry.  Neurological:     Mental Status: He is alert.  Psychiatric:        Mood and Affect: Mood normal.      UC Treatments / Results  Labs (all labs ordered are listed, but only abnormal results are displayed) Labs Reviewed - No data to display  EKG   Radiology DG Hand Complete Right  Result Date: 07/30/2020 CLINICAL DATA:  Dorsal right hand injury with tearing of the skin. EXAM: RIGHT HAND - COMPLETE 3+ VIEW COMPARISON:  None. FINDINGS: Dorsal soft tissue laceration at the level of the proximal metacarpals. No fracture, dislocation or radiopaque foreign body. IMPRESSION: Dorsal soft tissue laceration without fracture or radiopaque foreign body. Electronically Signed   By: Claudie Revering M.D.   On: 07/30/2020 13:15    Procedures Procedures (including critical care time)  Medications Ordered in UC Medications - No data to display  Initial Impression / Assessment and Plan / UC Course  I have reviewed the triage vital signs and the nursing notes.  Pertinent labs & imaging results that were available during my care of the patient were reviewed by me and considered in my medical decision making (see chart for details).      Hand injury with skin tear X-ray without any acute findings. Cleaned and rewrapped here in clinic today Recommended daily dressing changes, keep wound clean and dry Follow up as needed for continued or worsening symptoms  Final Clinical Impressions(s) / UC Diagnoses   Final diagnoses:  Injury of right hand, initial encounter  Skin tear of right hand without complication, initial encounter     Discharge Instructions     X ray did not show any fractures Keep the hand clean, antibiotic ointment and wrapped.  Follow up as  needed for continued or worsening symptoms     ED Prescriptions    None     PDMP not reviewed this encounter.   Orvan July, NP 07/30/20 1332

## 2020-07-30 NOTE — ED Notes (Signed)
R hand skin tear cleansed w/ wound cleanser and antibiotic ointment w/ nonadherent dressing applied. Wrapped w/ coban per order of T. Rozanna Box, NP.

## 2020-07-30 NOTE — Discharge Instructions (Signed)
X ray did not show any fractures Keep the hand clean, antibiotic ointment and wrapped.  Follow up as needed for continued or worsening symptoms

## 2020-07-30 NOTE — ED Triage Notes (Signed)
Pt presents to Urgent Care with c/o bilateral hand pain d/t injury while doing housework. States something fell and hit his hands last night. Both hands w/ bruising and R hand w/ skin tear.

## 2020-09-13 ENCOUNTER — Other Ambulatory Visit: Payer: Self-pay | Admitting: Internal Medicine

## 2020-09-17 ENCOUNTER — Other Ambulatory Visit: Payer: Self-pay

## 2020-09-17 ENCOUNTER — Encounter: Payer: Self-pay | Admitting: Adult Health Nurse Practitioner

## 2020-09-17 ENCOUNTER — Ambulatory Visit (INDEPENDENT_AMBULATORY_CARE_PROVIDER_SITE_OTHER): Payer: PPO | Admitting: Adult Health Nurse Practitioner

## 2020-09-17 VITALS — BP 120/80 | HR 60 | Temp 97.7°F | Ht 63.0 in | Wt 180.0 lb

## 2020-09-17 DIAGNOSIS — K219 Gastro-esophageal reflux disease without esophagitis: Secondary | ICD-10-CM

## 2020-09-17 DIAGNOSIS — Z79899 Other long term (current) drug therapy: Secondary | ICD-10-CM

## 2020-09-17 DIAGNOSIS — E1159 Type 2 diabetes mellitus with other circulatory complications: Secondary | ICD-10-CM

## 2020-09-17 DIAGNOSIS — E663 Overweight: Secondary | ICD-10-CM

## 2020-09-17 DIAGNOSIS — E559 Vitamin D deficiency, unspecified: Secondary | ICD-10-CM | POA: Diagnosis not present

## 2020-09-17 DIAGNOSIS — E1169 Type 2 diabetes mellitus with other specified complication: Secondary | ICD-10-CM | POA: Diagnosis not present

## 2020-09-17 DIAGNOSIS — N182 Chronic kidney disease, stage 2 (mild): Secondary | ICD-10-CM | POA: Diagnosis not present

## 2020-09-17 DIAGNOSIS — E1122 Type 2 diabetes mellitus with diabetic chronic kidney disease: Secondary | ICD-10-CM

## 2020-09-17 DIAGNOSIS — N521 Erectile dysfunction due to diseases classified elsewhere: Secondary | ICD-10-CM

## 2020-09-17 DIAGNOSIS — I10 Essential (primary) hypertension: Secondary | ICD-10-CM

## 2020-09-17 DIAGNOSIS — E785 Hyperlipidemia, unspecified: Secondary | ICD-10-CM | POA: Diagnosis not present

## 2020-09-17 MED ORDER — PANTOPRAZOLE SODIUM 40 MG PO TBEC: DELAYED_RELEASE_TABLET | ORAL | 2 refills | Status: DC

## 2020-09-17 NOTE — Patient Instructions (Signed)
  Increase with amount of water you drink.  When you need a refill of the Protonix let us know we have increased the amount.  We will call you with lab results in 1-3 days.    GENERAL HEALTH GOALS  Know what a healthy weight is for you (roughly BMI <25) and aim to maintain this  Aim for 7+ servings of fruits and vegetables daily  70-80+ fluid ounces of water or unsweet tea for healthy kidneys  Limit to max 1 drink of alcohol per day; avoid smoking/tobacco  Limit animal fats in diet for cholesterol and heart health - choose grass fed whenever available  Avoid highly processed foods, and foods high in saturated/trans fats  Aim for low stress - take time to unwind and care for your mental health  Aim for 150 min of moderate intensity exercise weekly for heart health, and weights twice weekly for bone health  Aim for 7-9 hours of sleep daily

## 2020-09-17 NOTE — Progress Notes (Signed)
3 MONTH FOLLOW UP   Assessment:    Essential hypertension Goal less than 130/80 Monitor blood pressure at home; call if consistently over 130/80 Continue DASH diet.   Reminder to go to the ER if any CP, SOB, nausea, dizziness, severe HA, changes vision/speech, left arm numbness and tingling and jaw pain.  Type 2 diabetes mellitus with stage 2 chronic kidney disease, without long-term current use of insulin ([place]) Discussed general issues about diabetes pathophysiology and management., Educational material distributed., Suggested low cholesterol diet., Encouraged aerobic exercise., Discussed foot care., Reminded to get yearly retinal exam, reports requested Continue metformin - A1C  CKD stage 2 due to type 2 diabetes mellitus ([place]) Borderline stage 2/3 Not on ACE/ARB due to low BPs; has been stable Increase fluids, avoid NSAIDS, monitor sugars, will monitor  GERD Doing well at this time Continue: Pantropazole BID Diet discussed Monitor for triggers Avoid food with high acid content Avoid excessive cafeine Increase water intake  Erectile dysfunction asociated with T2DM (HCC) Continue PRN medication Control blood pressure, cholesterol, glucose, increase exercise.   Mixed hyperlipidemia associated with T2DM ([place]) discussed LDL goal <70 with diabetes Consider reducing atorvastatin if remains aggressively controlled Continue low cholesterol diet and exercise.   Overweight (BMI 25.0-29.9) Long discussion about weight loss, diet, and exercise Recommended diet heavy in fruits and veggies and low in animal meats, cheeses, and dairy products, appropriate calorie intake Discussed appropriate weight for height  Follow up at next visit  Vitamin D deficiency Essentially at recent check;  continue to recommend supplementation for goal of 70-100 Defer vitamin D level  Medication Management Continued  Over 30 minutes of face to face exam, counseling, chart review and critical  decision making was performed.   Future Appointments  Date Time Provider [place]  01/07/2021 11:00 AM Unk Pinto, MD [facility] None  04/08/2021 11:15 AM Liane Comber, NP [facility] None  07/09/2021 10:30 AM Unk Pinto, MD [facility] None     Subjective:  Edward [name] is a 80 y.o. male who presents for and 3 month follow up - T2DM with CKD, htn/hld, weight, vitamin D def, GERD.   Reports he has not health or medication concerns today.  He has a diagnosis of GERD which is currently well controlled by Protonix 40 mg twice a daily   BMI is Body mass index is 31.89 kg/m., he has been working on diet and exercise, works taking care of 6 yards, also lifts weights at home.  Wt Readings from Last 3 Encounters:  09/17/20 180 lb (81.6 kg)  07/30/20 172 lb (78 kg)  06/08/20 175 lb (79.4 kg)   Former remote smoker, quit in 1969.   His blood pressure has been controlled at home, today their BP is BP: 120/80  He does workout. He denies chest pain, shortness of breath, dizziness.   He is on cholesterol medication (lipitor 40 mg daily) and denies myalgias. His cholesterol is at goal. The cholesterol last visit was:   Lab Results  Component Value Date   CHOL 117 06/08/2020   HDL 48 06/08/2020   LDLCALC 56 06/08/2020   TRIG 52 06/08/2020   CHOLHDL 2.4 06/08/2020    He has been working on diet and exercise for T2 diabetes with CKD II/III and ED (sildenafil), well controlled by metformin 500 mg TID, and denies increased appetite, nausea, paresthesia of the feet, polydipsia, polyuria, visual disturbances, vomiting and weight loss.  He does check fasting glucose, ranges 111-118, did have a few lows, 68  this am with some shaking. Last A1C in the office was:  Lab Results  Component Value Date   HGBA1C 6.5 (H) 06/08/2020   He has CKD borderline II/III associated with T2DM monitored at this office, not on ace/arb, due to low BPs:  Lab Results  Component Value Date    GFRNONAA 58 (L) 06/08/2020   Patient is on Vitamin D supplement and mildly above goal at last check, he is unsure how much taking, "2 caps":    Lab Results  Component Value Date   VD25OH 97 06/08/2020      Medication Review: Current Outpatient Medications on File Prior to Visit  Medication Sig Dispense Refill  . APPLE CIDER VINEGAR PO Take by mouth.    Marland Kitchen aspirin 81 MG tablet Take 81 mg by mouth daily.    Marland Kitchen atorvastatin (LIPITOR) 40 MG tablet Take    1 tablet    Daily    for Cholesterol 90 tablet 0  . Blood Glucose Monitoring Suppl DEVI Use device to test blood sugar once daily 1 each 0  . Cholecalciferol (VITAMIN D PO) Take 5,000 Units by mouth daily.    Marland Kitchen CINNAMON PO Take by mouth. Take 1-2 tablets daily    . Cyanocobalamin (VITAMIN B 12 PO) Take 1 tablet by mouth daily.    . famotidine (PEPCID) 40 MG tablet Take      1 tablet       at Bedtime       to Prevent Heartburn & Indigestion 90 tablet 0  . glucose blood (ONETOUCH VERIO) test strip Test blood sugar once daily 100 each 12  . Lancets MISC Test blood sugar once daily 100 each 11  . metFORMIN (GLUCOPHAGE) 500 MG tablet Take 1 tablet (500 mg total) by mouth with breakfast, with lunch, and with evening meal. Take one tablet three times a day 270 tablet 0  . Omega-3 Fatty Acids (FISH OIL) 1000 MG CAPS Take 1 capsule by mouth daily.    . pantoprazole (PROTONIX) 40 MG tablet Take     1 tablet      Daily       to Prevent Heartburn & Indigestion 90 tablet 0  . sildenafil (REVATIO) 20 MG tablet TAKE 1-5 TABLETS BY MOUTH ONE HOUR BEFORE NEEDED 90 tablet 3  . tamsulosin (FLOMAX) 0.4 MG CAPS capsule TAKE 1 CAPSULE BY MOUTH ONCE DAILY FOR PROSTATE 90 capsule 0  . zinc gluconate 50 MG tablet Take 50 mg by mouth daily.     No current facility-administered medications on file prior to visit.    Allergies  Allergen Reactions  . Bystolic [Nebivolol Hcl]     Current Problems (verified) Patient Active Problem List   Diagnosis Date Noted  .  Former smoker, stopped smoking in distant past 09/02/2018  . FHx: heart disease 09/02/2018  . History of adenomatous polyp of colon 11/02/2017  . CKD stage 2 due to type 2 diabetes mellitus ([place]) 11/02/2017  . Type 2 diabetes with circulatory disorder causing erectile dysfunction ([place]) 11/01/2014  . Overweight (BMI 25.0-29.9) 07/25/2014  . Hyperlipidemia associated with type 2 diabetes mellitus ([place])   . Gastroesophageal reflux disease   . Essential hypertension   . Vitamin D deficiency   . BPH with obstruction/lower urinary tract symptoms    SURGICAL HISTORY He  has a past surgical history that includes Cataract extraction w/ intraocular lens implant (2013); Kidney stone surgery; Extracorporeal shock wave lithotripsy (Left, 12/11/2017); Colonoscopy (2013); Polypectomy; and Cataract extraction (Right,  2015). FAMILY HISTORY His family history includes Diabetes in his sister and sister; Heart disease in his mother; Hypertension in his sister and sister. SOCIAL HISTORY He  reports that he quit smoking about 53 years ago. He has a 5.00 pack-year smoking history. He has never used smokeless tobacco. He reports that he does not drink alcohol and does not use drugs.   Review of Systems  Constitutional: Negative for malaise/fatigue and weight loss.  HENT: Negative for congestion, ear discharge, ear pain, hearing loss, sore throat and tinnitus.        Mild running nose  Eyes: Negative for blurred vision and double vision.  Respiratory: Positive for cough (mild, nonproductive started last night). Negative for sputum production, shortness of breath and wheezing.   Cardiovascular: Negative for chest pain, palpitations, orthopnea, claudication, leg swelling and PND.  Gastrointestinal: Negative for abdominal pain, blood in stool, constipation, diarrhea, heartburn, melena, nausea and vomiting.  Genitourinary: Negative.   Musculoskeletal: Negative for falls, joint pain and myalgias.  Skin: Negative for  rash.  Neurological: Negative for dizziness, tingling, sensory change, weakness and headaches.  Endo/Heme/Allergies: Negative for polydipsia.  Psychiatric/Behavioral: Negative.  Negative for depression, memory loss, substance abuse and suicidal ideas. The patient is not nervous/anxious and does not have insomnia.   All other systems reviewed and are negative.    Objective:     Today's Vitals   09/17/20 1101  BP: 120/80  Pulse: 60  Temp: 97.7 F (36.5 C)  SpO2: 97%  Weight: 180 lb (81.6 kg)  Height: 5\' 3"  (1.6 m)   Body mass index is 31.89 kg/m.  General appearance: alert, no distress, WD/WN, male HEENT: normocephalic, sclerae anicteric, bilateral external auditory canals clear, pharynx normal Oral cavity: MMM, no lesions Neck: supple, no lymphadenopathy, no thyromegaly, no masses Heart: RRR, normal S1, S2, no murmurs Lungs: CTA bilaterally, no wheezes, rhonchi, or rales Abdomen: +bs, soft, non tender, non distended, no masses, no hepatomegaly, no splenomegaly Musculoskeletal: nontender, no swelling, no obvious deformity Extremities: no edema, no cyanosis, no clubbing Pulses: 2+ symmetric, upper and lower extremities, normal cap refill Neurological: alert, oriented x 3, CN2-12 intact, strength normal upper extremities and lower extremities, sensation normal throughout, DTRs 2+ throughout, no cerebellar signs, gait normal Psychiatric: normal affect, behavior normal, pleasant     , NP   09/17/2020

## 2020-09-18 LAB — CBC WITH DIFFERENTIAL/PLATELET
Absolute Monocytes: 437 cells/uL (ref 200–950)
Basophils Absolute: 9 cells/uL (ref 0–200)
Basophils Relative: 0.2 %
Eosinophils Absolute: 189 cells/uL (ref 15–500)
Eosinophils Relative: 4.1 %
HCT: 43.8 % (ref 38.5–50.0)
Hemoglobin: 14.5 g/dL (ref 13.2–17.1)
Lymphs Abs: 1164 cells/uL (ref 850–3900)
MCH: 29.7 pg (ref 27.0–33.0)
MCHC: 33.1 g/dL (ref 32.0–36.0)
MCV: 89.8 fL (ref 80.0–100.0)
MPV: 11.7 fL (ref 7.5–12.5)
Monocytes Relative: 9.5 %
Neutro Abs: 2801 cells/uL (ref 1500–7800)
Neutrophils Relative %: 60.9 %
Platelets: 147 10*3/uL (ref 140–400)
RBC: 4.88 10*6/uL (ref 4.20–5.80)
RDW: 12.3 % (ref 11.0–15.0)
Total Lymphocyte: 25.3 %
WBC: 4.6 10*3/uL (ref 3.8–10.8)

## 2020-09-18 LAB — COMPLETE METABOLIC PANEL WITH GFR
AG Ratio: 2 (calc) (ref 1.0–2.5)
ALT: 13 U/L (ref 9–46)
AST: 12 U/L (ref 10–35)
Albumin: 4.2 g/dL (ref 3.6–5.1)
Alkaline phosphatase (APISO): 63 U/L (ref 35–144)
BUN/Creatinine Ratio: 15 (calc) (ref 6–22)
BUN: 20 mg/dL (ref 7–25)
CO2: 29 mmol/L (ref 20–32)
Calcium: 9.5 mg/dL (ref 8.6–10.3)
Chloride: 106 mmol/L (ref 98–110)
Creat: 1.3 mg/dL — ABNORMAL HIGH (ref 0.70–1.18)
GFR, Est African American: 61 mL/min/{1.73_m2} (ref >=60)
GFR, Est Non African American: 52 mL/min/{1.73_m2} — ABNORMAL LOW (ref >=60)
Globulin: 2.1 g/dL (calc) (ref 1.9–3.7)
Glucose, Bld: 102 mg/dL — ABNORMAL HIGH (ref 65–99)
Potassium: 4.4 mmol/L (ref 3.5–5.3)
Sodium: 141 mmol/L (ref 135–146)
Total Bilirubin: 0.8 mg/dL (ref 0.2–1.2)
Total Protein: 6.3 g/dL (ref 6.1–8.1)

## 2020-09-18 LAB — LIPID PANEL
Cholesterol: 111 mg/dL (ref <200)
HDL: 48 mg/dL (ref >=40)
LDL Cholesterol (Calc): 50 mg/dL (calc)
Non-HDL Cholesterol (Calc): 63 mg/dL (calc) (ref <130)
Total CHOL/HDL Ratio: 2.3 (calc) (ref <5.0)
Triglycerides: 45 mg/dL (ref <150)

## 2020-09-18 LAB — HEMOGLOBIN A1C
Hgb A1c MFr Bld: 6.7 % of total Hgb — ABNORMAL HIGH (ref <5.7)
Mean Plasma Glucose: 146 mg/dL
eAG (mmol/L): 8.1 mmol/L

## 2020-10-15 ENCOUNTER — Other Ambulatory Visit: Payer: Self-pay | Admitting: Internal Medicine

## 2020-10-15 ENCOUNTER — Other Ambulatory Visit: Payer: Self-pay | Admitting: Adult Health

## 2020-10-15 DIAGNOSIS — E1122 Type 2 diabetes mellitus with diabetic chronic kidney disease: Secondary | ICD-10-CM

## 2020-10-15 DIAGNOSIS — K219 Gastro-esophageal reflux disease without esophagitis: Secondary | ICD-10-CM

## 2020-10-15 MED ORDER — PANTOPRAZOLE SODIUM 40 MG PO TBEC
DELAYED_RELEASE_TABLET | ORAL | 0 refills | Status: DC
Start: 2020-10-15 — End: 2020-12-16

## 2020-10-19 ENCOUNTER — Other Ambulatory Visit: Payer: Self-pay | Admitting: Internal Medicine

## 2020-10-19 DIAGNOSIS — K219 Gastro-esophageal reflux disease without esophagitis: Secondary | ICD-10-CM

## 2020-10-21 ENCOUNTER — Other Ambulatory Visit: Payer: Self-pay | Admitting: *Deleted

## 2020-10-21 MED ORDER — TAMSULOSIN HCL 0.4 MG PO CAPS: ORAL_CAPSULE | ORAL | 0 refills | Status: DC

## 2020-10-25 ENCOUNTER — Other Ambulatory Visit: Payer: Self-pay | Admitting: Internal Medicine

## 2020-10-27 ENCOUNTER — Encounter: Payer: Self-pay | Admitting: Internal Medicine

## 2020-11-10 ENCOUNTER — Other Ambulatory Visit: Payer: Self-pay | Admitting: Internal Medicine

## 2020-11-10 DIAGNOSIS — K219 Gastro-esophageal reflux disease without esophagitis: Secondary | ICD-10-CM

## 2020-11-17 ENCOUNTER — Encounter: Payer: PPO | Admitting: Internal Medicine

## 2020-12-16 ENCOUNTER — Other Ambulatory Visit: Payer: Self-pay | Admitting: Internal Medicine

## 2020-12-16 DIAGNOSIS — N183 Chronic kidney disease, stage 3 unspecified: Secondary | ICD-10-CM

## 2020-12-16 DIAGNOSIS — K219 Gastro-esophageal reflux disease without esophagitis: Secondary | ICD-10-CM

## 2020-12-16 DIAGNOSIS — E1122 Type 2 diabetes mellitus with diabetic chronic kidney disease: Secondary | ICD-10-CM

## 2021-01-06 NOTE — Progress Notes (Signed)
Annual  Screening/Preventative Visit  & Comprehensive Evaluation & Examination   Future Appointments  Date Time Provider [place]  01/07/2021 11:00 AM Unk Pinto, MD [facility] None  04/08/2021 11:00 AM Liane Comber, NP [facility] None  07/09/2021 10:30 AM Unk Pinto, MD [facility] None  01/11/2022 11:00 AM Unk Pinto, MD [facility] None                                               This very nice 80 y.o.  MWM presents for a Screening /Preventative Visit & comprehensive evaluation and management of multiple medical co-morbidities.  Patient has been followed for HTN, HLD, T2_NIDDM  and Vitamin D Deficiency. Patient has GERD controlled with diet & his meds.       HTN predates circa 2010. Patient's BP has been controlled at home.  Today's BP is at goal - 140/74. Patient denies any cardiac symptoms as chest pain, palpitations, shortness of breath, dizziness or ankle swelling.      Patient's hyperlipidemia is controlled with diet and Atorvastatin. Patient denies myalgias or other medication SE's. Last lipids were at goal:  Lab Results  Component Value Date   CHOL 111 09/17/2020   HDL 48 09/17/2020   LDLCALC 50 09/17/2020   TRIG 45 09/17/2020   CHOLHDL 2.3 09/17/2020        Patient has hx/o  initially PreDiabetes (A1c 6.1% /2011), then T2_NIDDM (A1c 6.5% /2015&6.7% /2017) w/CKD3a (GFR 52) and patient denies reactive hypoglycemic symptoms, visual blurring, diabetic polys or paresthesias.  Infrequently checks CBG's . Last A1c was not at goal:  Lab Results  Component Value Date   HGBA1C 6.7 (H) 09/17/2020         Finally, patient has history of Vitamin D Deficiency ("29" /2008) and last vitamin D was at goal:   Lab Results  Component Value Date   VD25OH 97 06/08/2020    Current Outpatient Medications on File Prior to Visit  Medication Sig  . APPLE CIDER VINEGAR  Take by mouth.  Marland Kitchen aspirin 81 MG tablet Take  daily.  Marland Kitchen atorvastatin 40 MG  tablet TAKE 1 TABLET  DAILY  . VITAMIN D Take 5,000 Units by mouth daily.  Marland Kitchen CINNAMON  Take by mouth. Take 1-2 tablets daily  . VITAMIN B 12  Take 1 tablet by mouth daily.  . famotidine (40 MG tablet Take  1 tablet  at Bedtime   . metFORMIN 500 MG tablet Take  1 tablet  3 x /day  with Meals    . Omega-3 FISH OIL 1000 MG  Take 1 capsule daily.  . pantoprazole  40 MG tablet Take  1 tablet  Daily  . sildenafil 20 MG tablet TAKE 1-5 TABLETS /day  as needed   . tamsulosin  0.4 MG CAPS  Take  1 capsule  at Bedtime   . zinc  50 MG tablet Take  daily.     Allergies  Allergen Reactions  . Bystolic [Nebivolol Hcl]     Past Medical History:  Diagnosis Date  . BPH (benign prostatic hyperplasia)   . Cataract    surgical removal bilateral  . Diabetes mellitus without complication ([place])   . ED (erectile dysfunction)   . GERD (gastroesophageal reflux disease)   . History of kidney stones   . Hyperlipidemia   . Hypertension   . IBS (irritable  bowel syndrome)   . Kidney stones 02/11/2020  . LBBB (left bundle branch block)   . Personal history of kidney stones   . Pre-diabetes   . Vitamin D deficiency     Health Maintenance  Topic Date Due  . Hepatitis C Screening  Never done  . COVID-19 Vaccine (3 - Booster for Pfizer series) 04/22/2020  . FOOT EXAM  11/04/2020  . URINE MICROALBUMIN  11/05/2020  . HEMOGLOBIN A1C  03/17/2021  . INFLUENZA VACCINE  04/12/2021  . OPHTHALMOLOGY EXAM  05/06/2021  . TETANUS/TDAP  04/08/2024  . PNA vac Low Risk Adult  Completed  . HPV VACCINES  Aged Out    Immunization History  Administered Date(s) Administered  . DT (Pediatric) 04/08/2014  . Influenza, High Dose Seasonal PF 06/24/2014, 07/28/2015, 05/02/2016, 07/31/2017, 05/30/2018, 06/20/2019, 06/08/2020  . Influenza-Unspecified 07/02/2013  . PFIZER(Purple Top)SARS-COV-2 Vaccination 10/03/2019, 10/24/2019  . Pneumococcal Conjugate-13 04/09/2015  . Pneumococcal-Unspecified 10/02/2007  . Td 10/02/2003   . Zoster 10/01/2006  . Zoster Recombinat (Shingrix) 05/28/2020    Last Colon - 12/24/2019 - Dr  Henrene Pastor - recommended No f/u due to age.   Past Surgical History:  Procedure Laterality Date  . CATARACT EXTRACTION Right 2015  . Lt CE/IOL  2013  . COLONOSCOPY  2013  . EXTRACORPOREAL SHOCK WAVE LITHOTRIPSY Left 12/11/2017           Lucas Mallow, MD  . KIDNEY STONE SURGERY    . POLYPECTOMY      Family History  Problem Relation Age of Onset  . Diabetes Sister   . Hypertension Sister   . Heart disease Mother   . Diabetes Sister   . Hypertension Sister   . Colon cancer Neg Hx   . Colon polyps Neg Hx   . Esophageal cancer Neg Hx   . Rectal cancer Neg Hx   . Stomach cancer Neg Hx     Social History   Socioeconomic History  . Marital status: Married    Spouse name: Arbie Cookey  . Number of children: 2 sons  Occupational History  . Retired - has a Programmer, applications business  Tobacco Use  . Smoking status: Former Smoker    Packs/day: 0.50    Years: 10.00    Pack years: 5.00    Quit date: 09/13/1967    Years since quitting: 53.3  . Smokeless tobacco: Never Used  Vaping Use  . Vaping Use: Never used  Substance and Sexual Activity  . Alcohol use: No  . Drug use: No  . Sexual activity: Not Currently    ROS Constitutional: Denies fever, chills, weight loss/gain, headaches, insomnia,  night sweats or change in appetite. Does c/o fatigue. Eyes: Denies redness, blurred vision, diplopia, discharge, itchy or watery eyes.  ENT: Denies discharge, congestion, post nasal drip, epistaxis, sore throat, earache, hearing loss, dental pain, Tinnitus, Vertigo, Sinus pain or snoring.  Cardio: Denies chest pain, palpitations, irregular heartbeat, syncope, dyspnea, diaphoresis, orthopnea, PND, claudication or edema Respiratory: denies cough, dyspnea, DOE, pleurisy, hoarseness, laryngitis or wheezing.  Gastrointestinal: Denies dysphagia, heartburn, reflux, water brash, pain, cramps, nausea, vomiting,  bloating, diarrhea, constipation, hematemesis, melena, hematochezia, jaundice or hemorrhoids Genitourinary: Denies dysuria, frequency,  discharge, hematuria or flank pain. Has urgency, nocturia x 2-3 & occasional hesitancy. Musculoskeletal: Denies arthralgia, myalgia, stiffness, Jt. Swelling, pain, limp or strain/sprain. Denies Falls. Skin: Denies puritis, rash, hives, warts, acne, eczema or change in skin lesion Neuro: No weakness, tremor, incoordination, spasms, paresthesia or pain Psychiatric: Denies confusion, memory  loss or sensory loss. Denies Depression. Endocrine: Denies change in weight, skin, hair change, nocturia, and paresthesia, diabetic polys, visual blurring or hyper / hypo glycemic episodes.  Heme/Lymph: No excessive bleeding, bruising or enlarged lymph nodes.  Physical Exam  BP 140/74   Pulse (!) 49   Temp 97.7 F (36.5 C)   Resp 16   Ht 5' 8.5" (1.74 m)   Wt 179 lb 3.2 oz (81.3 kg)   SpO2 95%   BMI 26.85 kg/m   General Appearance: Well nourished and well groomed and in no apparent distress.  Eyes: PERRLA, EOMs, conjunctiva no swelling or erythema, normal fundi and vessels. Sinuses: No frontal/maxillary tenderness ENT/Mouth: EACs patent / TMs  nl. Nares clear without erythema, swelling, mucoid exudates. Oral hygiene is good. No erythema, swelling, or exudate. Tongue normal, non-obstructing. Tonsils not swollen or erythematous. Hearing normal.  Neck: Supple, thyroid not palpable. No bruits, nodes or JVD. Respiratory: Respiratory effort normal.  BS equal and clear bilateral without rales, rhonci, wheezing or stridor. Cardio: Heart sounds are normal with regular rate and rhythm and no murmurs, rubs or gallops. Peripheral pulses are normal and equal bilaterally without edema. No aortic or femoral bruits. Chest: symmetric with normal excursions and percussion.  Abdomen: Soft, with Nl bowel sounds. Nontender, no guarding, rebound, hernias, masses, or organomegaly.   Lymphatics: Non tender without lymphadenopathy.  Musculoskeletal: Full ROM all peripheral extremities, joint stability, 5/5 strength, and normal gait. Skin: Warm and dry without rashes, lesions, cyanosis, clubbing or  ecchymosis.  Neuro: Cranial nerves intact, reflexes equal bilaterally. Normal muscle tone, no cerebellar symptoms. Sensation intact.  Pysch: Alert and oriented X 3 with normal affect, insight and judgment appropriate.   Assessment and Plan  1. Annual Preventative/Screening Exam    2. Essential hypertension  - EKG 12-Lead - [place], RETROPERITNL ABD,  LTD - CBC with Differential/Platelet - COMPLETE METABOLIC PANEL WITH GFR - Magnesium - TSH  3. Hyperlipidemia associated with type 2 diabetes mellitus ([place])  - EKG 12-Lead - [place], RETROPERITNL ABD,  LTD - Lipid panel - TSH  4. Type 2 diabetes mellitus with stage 3a chronic kidney  disease, without long-term current use of insulin ([facility])  - EKG 12-Lead - [place], RETROPERITNL ABD,  LTD - HM DIABETES FOOT EXAM - LOW EXTREMITY NEUR EXAM DOCUM - PTH, intact and calcium - Hemoglobin A1c - Insulin, random  5. Vitamin D deficiency  - VITAMIN D 25 Hydroxy  6. BPH with obstruction/lower urinary tract symptoms  - PSA  7. Gastroesophageal reflux disease  - CBC with Differential/Platelet  8. Prostate cancer screening  - PSA  9. Screening for colorectal cancer  - POC Hemoccult Bld/Stl   10. Screening for ischemic heart disease  - EKG 12-Lead  11. FHx: heart disease  - EKG 12-Lead - [place], RETROPERITNL ABD,  LTD  12. Former smoker  - EKG 12-Lead - [place], RETROPERITNL ABD,  LTD  13. Screening for AAA (aortic abdominal aneurysm)  - [place], RETROPERITNL ABD,  LTD  14. Medication management  - Urinalysis, Routine w reflex microscopic - Microalbumin / creatinine urine ratio - CBC with Differential/Platelet - COMPLETE METABOLIC PANEL WITH GFR - Magnesium - Lipid panel - TSH - Hemoglobin A1c - Insulin, random -  VITAMIN D 25 Hydroxy          Patient was counseled in prudent diet, weight control to achieve/maintain BMI less than 25, BP monitoring, regular exercise and medications as discussed.  Discussed med effects and SE's. Routine screening labs  and tests as requested with regular follow-up as recommended. Over 40 minutes of exam, counseling, chart review and high complex critical decision making was performed   Kirtland Bouchard, MD

## 2021-01-06 NOTE — Patient Instructions (Signed)

## 2021-01-07 ENCOUNTER — Encounter: Payer: Self-pay | Admitting: Internal Medicine

## 2021-01-07 ENCOUNTER — Ambulatory Visit (INDEPENDENT_AMBULATORY_CARE_PROVIDER_SITE_OTHER): Payer: PPO | Admitting: Internal Medicine

## 2021-01-07 ENCOUNTER — Other Ambulatory Visit: Payer: Self-pay

## 2021-01-07 VITALS — BP 140/74 | HR 49 | Temp 97.7°F | Resp 16 | Ht 68.5 in | Wt 179.2 lb

## 2021-01-07 DIAGNOSIS — Z0000 Encounter for general adult medical examination without abnormal findings: Secondary | ICD-10-CM

## 2021-01-07 DIAGNOSIS — E785 Hyperlipidemia, unspecified: Secondary | ICD-10-CM | POA: Diagnosis not present

## 2021-01-07 DIAGNOSIS — I10 Essential (primary) hypertension: Secondary | ICD-10-CM

## 2021-01-07 DIAGNOSIS — K219 Gastro-esophageal reflux disease without esophagitis: Secondary | ICD-10-CM

## 2021-01-07 DIAGNOSIS — N138 Other obstructive and reflux uropathy: Secondary | ICD-10-CM

## 2021-01-07 DIAGNOSIS — Z125 Encounter for screening for malignant neoplasm of prostate: Secondary | ICD-10-CM

## 2021-01-07 DIAGNOSIS — Z79899 Other long term (current) drug therapy: Secondary | ICD-10-CM

## 2021-01-07 DIAGNOSIS — Z1211 Encounter for screening for malignant neoplasm of colon: Secondary | ICD-10-CM

## 2021-01-07 DIAGNOSIS — E1169 Type 2 diabetes mellitus with other specified complication: Secondary | ICD-10-CM | POA: Diagnosis not present

## 2021-01-07 DIAGNOSIS — Z87891 Personal history of nicotine dependence: Secondary | ICD-10-CM

## 2021-01-07 DIAGNOSIS — Z136 Encounter for screening for cardiovascular disorders: Secondary | ICD-10-CM | POA: Diagnosis not present

## 2021-01-07 DIAGNOSIS — E559 Vitamin D deficiency, unspecified: Secondary | ICD-10-CM

## 2021-01-07 DIAGNOSIS — E1122 Type 2 diabetes mellitus with diabetic chronic kidney disease: Secondary | ICD-10-CM

## 2021-01-07 DIAGNOSIS — N1831 Chronic kidney disease, stage 3a: Secondary | ICD-10-CM

## 2021-01-07 DIAGNOSIS — Z8249 Family history of ischemic heart disease and other diseases of the circulatory system: Secondary | ICD-10-CM | POA: Diagnosis not present

## 2021-01-07 DIAGNOSIS — Z0001 Encounter for general adult medical examination with abnormal findings: Secondary | ICD-10-CM

## 2021-01-07 DIAGNOSIS — N401 Enlarged prostate with lower urinary tract symptoms: Secondary | ICD-10-CM | POA: Diagnosis not present

## 2021-01-07 NOTE — Progress Notes (Signed)
AortaScan < 3 cm. Within normal limits, per Dr McKeown. 

## 2021-01-08 LAB — TSH: TSH: 1.71 mIU/L (ref 0.40–4.50)

## 2021-01-08 LAB — URINALYSIS, ROUTINE W REFLEX MICROSCOPIC
Bilirubin Urine: NEGATIVE
Glucose, UA: NEGATIVE
Hgb urine dipstick: NEGATIVE
Ketones, ur: NEGATIVE
Leukocytes,Ua: NEGATIVE
Nitrite: NEGATIVE
Protein, ur: NEGATIVE
Specific Gravity, Urine: 1.009 (ref 1.001–1.035)
pH: 6 (ref 5.0–8.0)

## 2021-01-08 LAB — HEMOGLOBIN A1C
Hgb A1c MFr Bld: 6.4 % of total Hgb — ABNORMAL HIGH (ref <5.7)
Mean Plasma Glucose: 137 mg/dL
eAG (mmol/L): 7.6 mmol/L

## 2021-01-08 LAB — MICROALBUMIN / CREATININE URINE RATIO
Creatinine, Urine: 45 mg/dL (ref 20–320)
Microalb Creat Ratio: 9 mcg/mg creat (ref <30)
Microalb, Ur: 0.4 mg/dL

## 2021-01-08 LAB — CBC WITH DIFFERENTIAL/PLATELET
Absolute Monocytes: 398 cells/uL (ref 200–950)
Basophils Absolute: 10 cells/uL (ref 0–200)
Basophils Relative: 0.2 %
Eosinophils Absolute: 72 cells/uL (ref 15–500)
Eosinophils Relative: 1.5 %
HCT: 43.3 % (ref 38.5–50.0)
Hemoglobin: 14 g/dL (ref 13.2–17.1)
Lymphs Abs: 1210 cells/uL (ref 850–3900)
MCH: 29.4 pg (ref 27.0–33.0)
MCHC: 32.3 g/dL (ref 32.0–36.0)
MCV: 90.8 fL (ref 80.0–100.0)
MPV: 11.5 fL (ref 7.5–12.5)
Monocytes Relative: 8.3 %
Neutro Abs: 3110 cells/uL (ref 1500–7800)
Neutrophils Relative %: 64.8 %
Platelets: 126 10*3/uL — ABNORMAL LOW (ref 140–400)
RBC: 4.77 10*6/uL (ref 4.20–5.80)
RDW: 12.7 % (ref 11.0–15.0)
Total Lymphocyte: 25.2 %
WBC: 4.8 10*3/uL (ref 3.8–10.8)

## 2021-01-08 LAB — COMPLETE METABOLIC PANEL WITH GFR
AG Ratio: 2.3 (calc) (ref 1.0–2.5)
ALT: 10 U/L (ref 9–46)
AST: 9 U/L — ABNORMAL LOW (ref 10–35)
Albumin: 4.2 g/dL (ref 3.6–5.1)
Alkaline phosphatase (APISO): 64 U/L (ref 35–144)
BUN/Creatinine Ratio: 15 (calc) (ref 6–22)
BUN: 19 mg/dL (ref 7–25)
CO2: 27 mmol/L (ref 20–32)
Calcium: 9.6 mg/dL (ref 8.6–10.3)
Chloride: 107 mmol/L (ref 98–110)
Creat: 1.31 mg/dL — ABNORMAL HIGH (ref 0.70–1.18)
GFR, Est African American: 60 mL/min/{1.73_m2} (ref >=60)
GFR, Est Non African American: 52 mL/min/{1.73_m2} — ABNORMAL LOW (ref >=60)
Globulin: 1.8 g/dL (calc) — ABNORMAL LOW (ref 1.9–3.7)
Glucose, Bld: 138 mg/dL — ABNORMAL HIGH (ref 65–99)
Potassium: 4.1 mmol/L (ref 3.5–5.3)
Sodium: 142 mmol/L (ref 135–146)
Total Bilirubin: 0.8 mg/dL (ref 0.2–1.2)
Total Protein: 6 g/dL — ABNORMAL LOW (ref 6.1–8.1)

## 2021-01-08 LAB — LIPID PANEL
Cholesterol: 116 mg/dL (ref <200)
HDL: 46 mg/dL (ref >=40)
LDL Cholesterol (Calc): 57 mg/dL (calc)
Non-HDL Cholesterol (Calc): 70 mg/dL (calc) (ref <130)
Total CHOL/HDL Ratio: 2.5 (calc) (ref <5.0)
Triglycerides: 51 mg/dL (ref <150)

## 2021-01-08 LAB — VITAMIN D 25 HYDROXY (VIT D DEFICIENCY, FRACTURES): Vit D, 25-Hydroxy: 130 ng/mL — ABNORMAL HIGH (ref 30–100)

## 2021-01-08 LAB — PTH, INTACT AND CALCIUM
Calcium: 9.6 mg/dL (ref 8.6–10.3)
PTH: 8 pg/mL — ABNORMAL LOW (ref 16–77)

## 2021-01-08 LAB — INSULIN, RANDOM: Insulin: 9.6 u[IU]/mL

## 2021-01-08 LAB — PSA: PSA: 2.63 ng/mL (ref <=4.00)

## 2021-01-08 LAB — MAGNESIUM: Magnesium: 1.8 mg/dL (ref 1.5–2.5)

## 2021-01-08 NOTE — Progress Notes (Signed)
============================================================ -   Test results slightly outside the reference range are not unusual. If there is anything important, I will review this with you,  otherwise it is considered normal test values.  If you have further questions,  please do not hesitate to contact me at the office or via My Chart.  ============================================================ ============================================================  -  PSA - Low - Great ! ============================================================ ============================================================  -  PTH is hormone that regulates calcium balance in the Body & is OK  ============================================================ ============================================================  -   Magnesium  -   1.8   -  very  low- goal is betw 2.0 - 2.5,  ============================================================ - So..............Marland Kitchen  Recommend that you take   Magnesium 500 mg tablet x 2 tablets /daily ============================================================ - also important to eat lots of  leafy green vegetables   - spinach - Kale - collards - greens - okra - asparagus  - broccoli - quinoa - squash - almonds   - black, red, white beans  -  peas - green beans ============================================================ ============================================================  -  Total chol = 116 and LDL Chol = 57 - Both  Excellent   - Very low risk for Heart Attack  / Stroke ============================================================ ============================================================  -  A1c is a little better - down from 6.7% to 6.4%  -  Being diabetic has a  300% increased risk for heart attack,  stroke, cancer, and alzheimer- type vascular dementia.   -It is very important that you work harder with diet by  avoiding all foods that are white except  chicken,   fish & calliflower.  - Avoid white rice  (brown & wild rice is OK),   - Avoid white potatoes  (sweet potatoes in moderation is OK),   White bread or wheat bread or anything made out of   white flour like bagels, donuts, rolls, buns, biscuits, cakes,  - pastries, cookies, pizza crust, and pasta (made from  white flour & egg whites)   - vegetarian pasta or spinach or wheat pasta is OK.  - Multigrain breads like Arnold's, Pepperidge Farm or   multigrain sandwich thins or high fiber breads like   [place] bread or "Dave's Killer" breads that are  4 to 5 grams fiber per slice !  are best.   ============================================================ ============================================================  -  Vitamin D = 130     is too high, So Stop for 1 week  - the   -cut your 5,000 unit capsules daily to only 4 x /week ie, leave off Mon Wed  Fri  ============================================================ ============================================================  -  All Else - CBC - Kidneys - Electrolytes - Liver - Magnesium & Thyroid    - all  Normal / OK ============================================================ ============================================================

## 2021-02-01 ENCOUNTER — Other Ambulatory Visit: Payer: Self-pay | Admitting: Internal Medicine

## 2021-02-02 ENCOUNTER — Other Ambulatory Visit: Payer: Self-pay | Admitting: Internal Medicine

## 2021-02-22 ENCOUNTER — Ambulatory Visit: Payer: PPO | Admitting: Adult Health

## 2021-03-16 ENCOUNTER — Other Ambulatory Visit: Payer: Self-pay | Admitting: Internal Medicine

## 2021-03-16 DIAGNOSIS — K219 Gastro-esophageal reflux disease without esophagitis: Secondary | ICD-10-CM

## 2021-03-18 ENCOUNTER — Ambulatory Visit: Payer: PPO | Admitting: Adult Health

## 2021-04-08 ENCOUNTER — Other Ambulatory Visit: Payer: Self-pay

## 2021-04-08 ENCOUNTER — Ambulatory Visit (INDEPENDENT_AMBULATORY_CARE_PROVIDER_SITE_OTHER): Payer: PPO | Admitting: Adult Health

## 2021-04-08 ENCOUNTER — Encounter: Payer: Self-pay | Admitting: Adult Health

## 2021-04-08 VITALS — BP 122/70 | HR 44 | Temp 97.7°F | Wt 178.0 lb

## 2021-04-08 DIAGNOSIS — N183 Chronic kidney disease, stage 3 unspecified: Secondary | ICD-10-CM

## 2021-04-08 DIAGNOSIS — R6889 Other general symptoms and signs: Secondary | ICD-10-CM | POA: Diagnosis not present

## 2021-04-08 DIAGNOSIS — Z87891 Personal history of nicotine dependence: Secondary | ICD-10-CM | POA: Diagnosis not present

## 2021-04-08 DIAGNOSIS — N401 Enlarged prostate with lower urinary tract symptoms: Secondary | ICD-10-CM | POA: Diagnosis not present

## 2021-04-08 DIAGNOSIS — N138 Other obstructive and reflux uropathy: Secondary | ICD-10-CM

## 2021-04-08 DIAGNOSIS — N1831 Chronic kidney disease, stage 3a: Secondary | ICD-10-CM | POA: Diagnosis not present

## 2021-04-08 DIAGNOSIS — I10 Essential (primary) hypertension: Secondary | ICD-10-CM | POA: Diagnosis not present

## 2021-04-08 DIAGNOSIS — E1169 Type 2 diabetes mellitus with other specified complication: Secondary | ICD-10-CM | POA: Diagnosis not present

## 2021-04-08 DIAGNOSIS — E663 Overweight: Secondary | ICD-10-CM

## 2021-04-08 DIAGNOSIS — K219 Gastro-esophageal reflux disease without esophagitis: Secondary | ICD-10-CM | POA: Diagnosis not present

## 2021-04-08 DIAGNOSIS — Z0001 Encounter for general adult medical examination with abnormal findings: Secondary | ICD-10-CM | POA: Diagnosis not present

## 2021-04-08 DIAGNOSIS — E785 Hyperlipidemia, unspecified: Secondary | ICD-10-CM

## 2021-04-08 DIAGNOSIS — E559 Vitamin D deficiency, unspecified: Secondary | ICD-10-CM

## 2021-04-08 DIAGNOSIS — Z0000 Encounter for general adult medical examination without abnormal findings: Secondary | ICD-10-CM

## 2021-04-08 DIAGNOSIS — E1122 Type 2 diabetes mellitus with diabetic chronic kidney disease: Secondary | ICD-10-CM

## 2021-04-08 NOTE — Progress Notes (Signed)
MEDICARE ANNUAL WELLNESS VISIT AND FOLLOW UP   Assessment:    Encounter for Medicare annual wellness exam UTD; requested have diabetes eye report and shingrix dates forwarded   Essential hypertension Goal less than 130/80 Monitor blood pressure at home; call if consistently over 130/80 Continue DASH diet.   Reminder to go to the ER if any CP, SOB, nausea, dizziness, severe HA, changes vision/speech, left arm numbness and tingling and jaw pain.  Gastroesophageal reflux disease, esophagitis presence not specified Well managed on current medications Discussed diet, avoiding triggers and other lifestyle changes  Type 2 diabetes mellitus with stage 3 chronic kidney disease, without long-term current use of insulin ([place]) Discussed general issues about diabetes pathophysiology and management., Educational material distributed., Suggested low cholesterol diet., Encouraged aerobic exercise., Discussed foot care., Reminded to get yearly retinal exam, reports requested Continue metformin - A1C  CKD stage 3 due to type 2 diabetes mellitus (HCC) Increase fluids, avoid NSAIDS, monitor sugars, will monitor  Benign prostatic hyperplasia with urinary frequency Stable on daily medications; check PSA annually  Erectile dysfunction asociated with T2DM ([place]) Continue PRN medication Control blood pressure, cholesterol, glucose, increase exercise.   Mixed hyperlipidemia associated with T2DM ([place]) discussed LDL goal <70 with diabetes Continue low cholesterol diet and exercise.   Overweight (BMI 25.0-29.9) Long discussion about weight loss, diet, and exercise Recommended diet heavy in fruits and veggies and low in animal meats, cheeses, and dairy products, appropriate calorie intake Discussed appropriate weight for height  Follow up at next visit  Vitamin D deficiency Above goal last check, has reduced supplement continue to recommend supplementation for goal of 70-100 Defer vitamin D level  to next OV  History of adenomatous polyp of colon Had colonoscopy 12/2019, with polyps, DONE per GI   Orders Placed This Encounter  Procedures   CBC with Differential/Platelet   COMPLETE METABOLIC PANEL WITH GFR   Magnesium   Lipid panel   TSH   Hemoglobin A1c     Over 40 minutes of exam, counseling, chart review and critical decision making was performed Future Appointments  Date Time Provider [place]  07/09/2021 10:30 AM Unk Pinto, MD [facility] None  01/11/2022 11:00 AM Unk Pinto, MD [facility] None  04/08/2022 11:00 AM Liane Comber, NP [facility] None     Plan:   During the course of the visit the patient was educated and counseled about appropriate screening and preventive services including:   Pneumococcal vaccine  Prevnar 13 Influenza vaccine Td vaccine Screening electrocardiogram Bone densitometry screening Colorectal cancer screening Diabetes screening Glaucoma screening Nutrition counseling  Advanced directives: requested   Subjective:  EWEL IKNER is a 80 y.o. male who presents for Medicare Annual Wellness Visit and 3 month follow up.   he has a diagnosis of GERD which is currently managed by Protonix 40 mg in AM famotidine 40 mg in PM he reports symptoms is currently well controlled, and denies breakthrough reflux, burning in chest, hoarseness or cough.    BMI is Body mass index is 26.67 kg/m., he has been working on diet and exercise, works taking care of 6 yards, also lifts weights at home.  Wt Readings from Last 3 Encounters:  04/08/21 178 lb (80.7 kg)  01/07/21 179 lb 3.2 oz (81.3 kg)  09/17/20 180 lb (81.6 kg)   Former remote smoker, quit in 1969.   His blood pressure has been controlled at home, today their BP is BP: 122/70  He does workout. He denies chest pain, shortness of  breath, dizziness.   He is on cholesterol medication (lipitor 40 mg daily) and denies myalgias. His cholesterol is at goal. The  cholesterol last visit was:   Lab Results  Component Value Date   CHOL 116 01/07/2021   HDL 46 01/07/2021   LDLCALC 57 01/07/2021   TRIG 51 01/07/2021   CHOLHDL 2.5 01/07/2021    He has been working on diet and exercise for T2 diabetes with CKD III and ED (sildenafil), well controlled by metformin, and denies increased appetite, nausea, paresthesia of the feet, polydipsia, polyuria, visual disturbances, vomiting and weight loss.  He does check fasting glucose, ranges 85-125. Last A1C in the office was:  Lab Results  Component Value Date   HGBA1C 6.4 (H) 01/07/2021   He has CKD borderline III associated with T2DM monitored at this office, pushes water intake:  Lab Results  Component Value Date   GFRNONAA 52 (L) 01/07/2021   Patient is on Vitamin D supplement and above goal at last check, he has reduced from 5000 IU to once or twice a week:    Lab Results  Component Value Date   VD25OH 130 (H) 01/07/2021      Medication Review: Current Outpatient Medications on File Prior to Visit  Medication Sig Dispense Refill   APPLE CIDER VINEGAR PO Take by mouth.     aspirin 81 MG tablet Take 81 mg by mouth daily.     atorvastatin (LIPITOR) 40 MG tablet TAKE 1 TABLET BY MOUTH ONCE DAILY FOR CHOLESTEROL 90 tablet 2   Blood Glucose Monitoring Suppl DEVI Use device to test blood sugar once daily 1 each 0   Cholecalciferol (VITAMIN D PO) Take 5,000 Units by mouth daily.     CINNAMON PO Take by mouth. Take 1-2 tablets daily     Cyanocobalamin (VITAMIN B 12 PO) Take 1 tablet by mouth daily.     famotidine (PEPCID) 40 MG tablet Take  1 tablet  at Bedtime  to Prevent Heartburn & Indigestion / Patient knows to take by mouth 90 tablet 3   glucose blood (ONETOUCH VERIO) test strip Test blood sugar once daily 100 each 12   Lancets MISC Test blood sugar once daily 100 each 11   metFORMIN (GLUCOPHAGE) 500 MG tablet Take  1 tablet  3 x /day  with Meals  for Diabetes 270 tablet 3   Omega-3 Fatty Acids  (FISH OIL) 1000 MG CAPS Take 1 capsule by mouth daily.     pantoprazole (PROTONIX) 40 MG tablet Take  1 tablet  Daily  to Prevent Heartburn & Indigestion 90 tablet 3   sildenafil (REVATIO) 20 MG tablet TAKE 1-5 TABLETS BY MOUTH ONE HOUR BEFORE NEEDED 90 tablet 3   tamsulosin (FLOMAX) 0.4 MG CAPS capsule Take  1 capsule  Daily  for Prostate 90 capsule 3   zinc gluconate 50 MG tablet Take 50 mg by mouth daily.     No current facility-administered medications on file prior to visit.    Allergies  Allergen Reactions   Bystolic [Nebivolol Hcl]     Current Problems (verified) Patient Active Problem List   Diagnosis Date Noted   Former smoker 09/02/2018   FHx: heart disease 09/02/2018   CKD stage 3 due to type 2 diabetes mellitus ([place]) 11/02/2017   Type 2 diabetes mellitus with stage 3a chronic kidney disease, without long-term current use of insulin ([place]) 11/01/2014   Overweight (BMI 25.0-29.9) 07/25/2014   Hyperlipidemia associated with type 2 diabetes mellitus ([place])  Gastroesophageal reflux disease    Essential hypertension    Vitamin D deficiency    BPH with obstruction/lower urinary tract symptoms     Screening Tests Immunization History  Administered Date(s) Administered   DT (Pediatric) 04/08/2014   Influenza, High Dose Seasonal PF 06/24/2014, 07/28/2015, 05/02/2016, 07/31/2017, 05/30/2018, 06/20/2019, 06/08/2020   Influenza-Unspecified 07/02/2013   PFIZER(Purple Top)SARS-COV-2 Vaccination 10/03/2019, 10/24/2019, 06/17/2020   Pneumococcal Conjugate-13 04/09/2015   Pneumococcal-Unspecified 10/02/2007   Td 10/02/2003   Zoster Recombinat (Shingrix) 05/28/2020   Zoster, Live 10/01/2006   Preventative care: Last colonoscopy: 12/2019, polyps, DONE per Dr. Henrene Pastor   Prior vaccinations: TD or Tdap: 2015  Influenza: 06/2020 Pneumococcal: 2009 Prevnar13: 2016  Shingles/Zostavax: 05/2020  Covid 19: 2/2, 2021, pfizer + booster   Names of Other Physician/Practitioners you  currently use: 1. [facility] here for primary care 2. [place], Dr. Ellie Lunch, eye doctor, last visit 2022, report requested 3.Dr. Alcus Dad, dentist, last visit  2022 q 6 months  Patient Care Team: Unk Pinto, MD as PCP - General (Internal Medicine) Luberta Mutter, MD as Consulting Physician (Ophthalmology) Inda Castle, MD (Inactive) as Consulting Physician (Gastroenterology) Lucas Mallow, MD as Consulting Physician (Urology)  SURGICAL HISTORY He  has a past surgical history that includes Cataract extraction w/ intraocular lens implant (2013); Kidney stone surgery; Extracorporeal shock wave lithotripsy (Left, 12/11/2017); Colonoscopy (2013); Polypectomy; and Cataract extraction (Right, 2015). FAMILY HISTORY His family history includes Diabetes in his sister and sister; Heart disease in his mother; Hypertension in his sister and sister. SOCIAL HISTORY He  reports that he quit smoking about 53 years ago. He has a 5.00 pack-year smoking history. He has never used smokeless tobacco. He reports that he does not drink alcohol and does not use drugs.   MEDICARE WELLNESS OBJECTIVES: Physical activity: Current Exercise Habits: The patient has a physically strenuous job, but has no regular exercise apart from work.;Structured exercise class, Type of exercise: Other - see comments, Time (Minutes): 60, Frequency (Times/Week): 7, Weekly Exercise (Minutes/Week): 420, Intensity: Mild, Exercise limited by: None identified Cardiac risk factors: Cardiac Risk Factors include: advanced age (>36mn, >>68women);dyslipidemia;hypertension;male gender;diabetes mellitus;smoking/ tobacco exposure Depression/mood screen:   Depression screen [facility] 04/08/2021  Decreased Interest 0  Down, Depressed, Hopeless 0  PHQ - 2 Score 0    ADLs:  In your present state of health, do you have any difficulty performing the following activities: 04/08/2021   Hearing? N  Vision? N  Difficulty concentrating or making decisions? N  Walking or climbing stairs? N  Dressing or bathing? N  Doing errands, shopping? N  Some recent data might be hidden     Cognitive Testing  Alert? Yes  Normal Appearance?Yes  Oriented to person? Yes  Place? Yes   Time? Yes  Recall of three objects?  Yes  Can perform simple calculations? Yes  Displays appropriate judgment?Yes  Can read the correct time from a watch face?Yes  EOL planning: Does Patient Have a Medical Advance Directive?: Yes Type of Advance Directive: Healthcare Power of Attorney, Living will Does patient want to make changes to medical advance directive?: No - Patient declined Copy of [place] Chart?: No - copy requested  Review of Systems  Constitutional:  Negative for malaise/fatigue and weight loss.  HENT:  Negative for congestion, ear discharge, ear pain, hearing loss, sore throat and tinnitus.   Eyes:  Negative for blurred vision and double vision.  Respiratory:  Negative for cough,  sputum production, shortness of breath and wheezing.   Cardiovascular:  Negative for chest pain, palpitations, orthopnea, claudication, leg swelling and PND.  Gastrointestinal:  Negative for abdominal pain, blood in stool, constipation, diarrhea, heartburn, melena, nausea and vomiting.  Genitourinary: Negative.   Musculoskeletal:  Negative for falls, joint pain and myalgias.  Skin:  Negative for rash.  Neurological:  Negative for dizziness, tingling, sensory change, weakness and headaches.  Endo/Heme/Allergies:  Negative for polydipsia.  Psychiatric/Behavioral: Negative.  Negative for depression, memory loss, substance abuse and suicidal ideas. The patient is not nervous/anxious and does not have insomnia.   All other systems reviewed and are negative.   Objective:     Today's Vitals   04/08/21 1131  BP: 122/70  Pulse: (!) 44  Temp: 97.7 F (36.5 C)  SpO2: 97%  Weight: 178 lb  (80.7 kg)   Body mass index is 26.67 kg/m.  General appearance: alert, no distress, WD/WN, male HEENT: normocephalic, sclerae anicteric, bilateral external auditory canals clear, pharynx normal Oral cavity: MMM, no lesions Neck: supple, no lymphadenopathy, no thyromegaly, no masses Heart: RRR, normal S1, S2, no murmurs Lungs: CTA bilaterally, no wheezes, rhonchi, or rales Abdomen: +bs, soft, non tender, non distended, no masses, no hepatomegaly, no splenomegaly Musculoskeletal: nontender, no swelling, no obvious deformity Extremities: no edema, no cyanosis, no clubbing Pulses: 2+ symmetric, upper and lower extremities, normal cap refill Neurological: alert, oriented x 3, CN2-12 intact, strength normal upper extremities and lower extremities, sensation normal throughout, DTRs 2+ throughout, no cerebellar signs, gait normal Psychiatric: normal affect, behavior normal, pleasant   Medicare Attestation I have personally reviewed: The patient's medical and social history Their use of alcohol, tobacco or illicit drugs Their current medications and supplements The patient's functional ability including ADLs,fall risks, home safety risks, cognitive, and hearing and visual impairment Diet and physical activities Evidence for depression or mood disorders  The patient's weight, height, BMI, and visual acuity have been recorded in the chart.  I have made referrals, counseling, and provided education to the patient based on review of the above and I have provided the patient with a written personalized care plan for preventive services.     Izora Ribas, NP   04/08/2021

## 2021-04-08 NOTE — Patient Instructions (Signed)
   Please send shingrix dates

## 2021-04-09 ENCOUNTER — Encounter: Payer: Self-pay | Admitting: Adult Health

## 2021-04-09 DIAGNOSIS — E1169 Type 2 diabetes mellitus with other specified complication: Secondary | ICD-10-CM | POA: Insufficient documentation

## 2021-04-09 LAB — COMPLETE METABOLIC PANEL WITH GFR
AG Ratio: 2.2 (calc) (ref 1.0–2.5)
ALT: 11 U/L (ref 9–46)
AST: 10 U/L (ref 10–35)
Albumin: 4.4 g/dL (ref 3.6–5.1)
Alkaline phosphatase (APISO): 63 U/L (ref 35–144)
BUN: 18 mg/dL (ref 7–25)
CO2: 27 mmol/L (ref 20–32)
Calcium: 9.1 mg/dL (ref 8.6–10.3)
Chloride: 108 mmol/L (ref 98–110)
Creat: 1.18 mg/dL (ref 0.70–1.28)
Globulin: 2 g/dL (calc) (ref 1.9–3.7)
Glucose, Bld: 100 mg/dL — ABNORMAL HIGH (ref 65–99)
Potassium: 4.5 mmol/L (ref 3.5–5.3)
Sodium: 142 mmol/L (ref 135–146)
Total Bilirubin: 0.7 mg/dL (ref 0.2–1.2)
Total Protein: 6.4 g/dL (ref 6.1–8.1)
eGFR: 63 mL/min/{1.73_m2} (ref >=60)

## 2021-04-09 LAB — HEMOGLOBIN A1C
Hgb A1c MFr Bld: 6.5 % of total Hgb — ABNORMAL HIGH (ref <5.7)
Mean Plasma Glucose: 140 mg/dL
eAG (mmol/L): 7.7 mmol/L

## 2021-04-09 LAB — TSH: TSH: 1.04 mIU/L (ref 0.40–4.50)

## 2021-04-09 LAB — LIPID PANEL
Cholesterol: 124 mg/dL (ref <200)
HDL: 51 mg/dL (ref >=40)
LDL Cholesterol (Calc): 60 mg/dL (calc)
Non-HDL Cholesterol (Calc): 73 mg/dL (calc) (ref <130)
Total CHOL/HDL Ratio: 2.4 (calc) (ref <5.0)
Triglycerides: 58 mg/dL (ref <150)

## 2021-04-09 LAB — MAGNESIUM: Magnesium: 1.9 mg/dL (ref 1.5–2.5)

## 2021-04-09 LAB — CBC WITH DIFFERENTIAL/PLATELET
Absolute Monocytes: 423 cells/uL (ref 200–950)
Basophils Absolute: 9 cells/uL (ref 0–200)
Basophils Relative: 0.2 %
Eosinophils Absolute: 80 cells/uL (ref 15–500)
Eosinophils Relative: 1.7 %
HCT: 46.7 % (ref 38.5–50.0)
Hemoglobin: 15.4 g/dL (ref 13.2–17.1)
Lymphs Abs: 1255 cells/uL (ref 850–3900)
MCH: 29.3 pg (ref 27.0–33.0)
MCHC: 33 g/dL (ref 32.0–36.0)
MCV: 89 fL (ref 80.0–100.0)
MPV: 11.8 fL (ref 7.5–12.5)
Monocytes Relative: 9 %
Neutro Abs: 2933 cells/uL (ref 1500–7800)
Neutrophils Relative %: 62.4 %
Platelets: 153 10*3/uL (ref 140–400)
RBC: 5.25 10*6/uL (ref 4.20–5.80)
RDW: 12.6 % (ref 11.0–15.0)
Total Lymphocyte: 26.7 %
WBC: 4.7 10*3/uL (ref 3.8–10.8)

## 2021-04-22 ENCOUNTER — Other Ambulatory Visit: Payer: Self-pay | Admitting: Adult Health

## 2021-04-22 DIAGNOSIS — N521 Erectile dysfunction due to diseases classified elsewhere: Secondary | ICD-10-CM

## 2021-04-22 DIAGNOSIS — E1159 Type 2 diabetes mellitus with other circulatory complications: Secondary | ICD-10-CM

## 2021-05-04 ENCOUNTER — Telehealth: Payer: Self-pay | Admitting: Internal Medicine

## 2021-05-04 NOTE — Chronic Care Management (AMB) (Signed)
  Chronic Care Management   Outreach Note  05/04/2021 Name: Edward [name] MRN: [id number] DOB: Feb 05, 1941  Referred by: Unk Pinto, MD Reason for referral : No chief complaint on file.   An unsuccessful telephone outreach was attempted today. The patient was referred to the pharmacist for assistance with care management and care coordination.   Follow Up Plan:   Tatjana Dellinger Upstream Scheduler

## 2021-05-05 ENCOUNTER — Telehealth: Payer: Self-pay | Admitting: Internal Medicine

## 2021-05-05 NOTE — Progress Notes (Signed)
  Chronic Care Management   Note  05/05/2021 Name: YAZEED CULVER MRN: [id number] DOB: 1941/08/02  Arne Massengill Stephenson is a 80 y.o. year old male who is a primary care patient of Unk Pinto, MD. I reached out to [employer] by phone today in response to a referral sent by Mr. Danney Hanney Dayley's PCP, Unk Pinto, MD.   Mr. Degarmo was given information about Chronic Care Management services today including:  CCM service includes personalized support from designated clinical staff supervised by his physician, including individualized plan of care and coordination with other care providers 24/7 contact phone numbers for assistance for urgent and routine care needs. Service will only be billed when office clinical staff spend 20 minutes or more in a month to coordinate care. Only one practitioner may furnish and bill the service in a calendar month. The patient may stop CCM services at any time (effective at the end of the month) by phone call to the office staff.   [place] verbally agreed to assistance and services provided by embedded care coordination/care management team today.  Follow up plan:   Tatjana Secretary/administrator

## 2021-05-10 DIAGNOSIS — D2271 Melanocytic nevi of right lower limb, including hip: Secondary | ICD-10-CM | POA: Diagnosis not present

## 2021-05-10 DIAGNOSIS — L821 Other seborrheic keratosis: Secondary | ICD-10-CM | POA: Diagnosis not present

## 2021-05-10 DIAGNOSIS — D1801 Hemangioma of skin and subcutaneous tissue: Secondary | ICD-10-CM | POA: Diagnosis not present

## 2021-05-10 DIAGNOSIS — D225 Melanocytic nevi of trunk: Secondary | ICD-10-CM | POA: Diagnosis not present

## 2021-06-18 DIAGNOSIS — H524 Presbyopia: Secondary | ICD-10-CM | POA: Diagnosis not present

## 2021-06-18 DIAGNOSIS — E119 Type 2 diabetes mellitus without complications: Secondary | ICD-10-CM | POA: Diagnosis not present

## 2021-06-18 DIAGNOSIS — Z961 Presence of intraocular lens: Secondary | ICD-10-CM | POA: Diagnosis not present

## 2021-06-18 LAB — HM DIABETES EYE EXAM

## 2021-06-21 ENCOUNTER — Ambulatory Visit: Payer: PPO | Admitting: Internal Medicine

## 2021-06-23 ENCOUNTER — Encounter: Payer: Self-pay | Admitting: Internal Medicine

## 2021-07-08 ENCOUNTER — Encounter: Payer: Self-pay | Admitting: Internal Medicine

## 2021-07-08 NOTE — Progress Notes (Signed)
Future Appointments  Date Time Provider [place]  07/09/2021 10:30 AM Unk Pinto, MD [facility] None  07/13/2021  3:00 PM Newton Pigg, [facility] None  01/11/2022 11:00 AM Unk Pinto, MD [facility] None  04/08/2022 11:00 AM Liane Comber, NP [facility] None    History of Present Illness:       This very nice 80 y.o. MWM presents for 6 month follow up with HTN, HLD, T2_NIDDM and Vitamin D Deficiency.        Patient is treated for HTN (2010)  & BP has been controlled at home. Today's BP is at goal -  140/74. Patient has had no complaints of any cardiac type chest pain, palpitations, dyspnea / orthopnea / PND, dizziness, claudication, or dependent edema.  Atorvastatin     Hyperlipidemia is controlled with diet /. Patient denies myalgias or other med SE's. Last Lipids were at goal:  Lab Results  Component Value Date   CHOL 124 04/08/2021   HDL 51 04/08/2021   LDLCALC 60 04/08/2021   TRIG 58 04/08/2021   CHOLHDL 2.4 04/08/2021     Also, the patient has history of  PreDM  (A1c 6.1% /2011 ) and then T2_NIDDM (A1c 6.5% -2015 and 6.7% -2017)  w/CKD2  (GFR 63)  and has had no symptoms of reactive hypoglycemia, diabetic polys, paresthesias or visual blurring.  Last A1c was not at goal:  Lab Results  Component Value Date   HGBA1C 6.5 (H) 04/08/2021                                                            Further, the patient also has history of Vitamin D Deficiency ("29" /2008) and supplements vitamin D without any suspected side-effects. Last vitamin D was elevated & dose was tapered :   Lab Results  Component Value Date   VD25OH 130 (H) 01/07/2021     Current Outpatient Medications on File Prior to Visit  Medication Sig   APPLE CIDER VINEGAR  Take daily   aspirin 81 MG tablet Take daily.   atorvastatin  40 MG tablet DAILY FOR CHOLESTEROL   VITAMIN D  5,000 Units  Take daily.   CINNAMON  Take Take 1-2 tablets daily   VITAMIN B 12  Take 1  tablet daily.   famotidine ( 40 MG tablet Take  1 tablet  at Bedtime     metFORMIN 500 MG tablet Take  1 tablet  3 x /day  with Meals    Omega-3 FISH OIL 1000 MG  Take 1 capsule by mouth daily.   pantoprazole 40 MG tablet Take  1 tablet  Daily  to Prevent Heartburn & Indigestion   sildenafil (REVATIO) 20 MG  TAKE 1-5 TABLETS BY MOUTH ONE HOUR BEFORE NEEDED   tamsulosin 0.4 MG CAPS capsule Take  1 capsule  Daily  for Prostate   zinc 50 MG tablet Take daily.    Allergies  Allergen Reactions   Bystolic [Nebivolol Hcl] ?    PMHx:   Past Medical History:  Diagnosis Date   BPH (benign prostatic hyperplasia)    Cataract    surgical removal bilateral   Diabetes mellitus without complication [facility])    ED (erectile dysfunction)    GERD (gastroesophageal reflux disease)    History of adenomatous polyp  of colon 11/02/2017   Tubular adenoma 2013; recommended 5 year follow up   History of kidney stones    Hyperlipidemia    Hypertension    IBS (irritable bowel syndrome)    Kidney stones 02/11/2020   LBBB (left bundle branch block)    Personal history of kidney stones    Vitamin D deficiency      Immunization History  Administered Date(s) Administered   DT (Pediatric) 04/08/2014   Influenza, High Dose  07/31/2017, 05/30/2018, 06/20/2019, 06/08/2020   Influenza 07/02/2013   PFIZER SARS-COV-2 Vacc 10/03/2019, 10/24/2019, 06/17/2020   Pneumococcal -13 04/09/2015   Pneumococcal-23 10/02/2007   Td 10/02/2003   Zoster Recombinat (Shingrix) 05/28/2020   Zoster, Live 10/01/2006     Past Surgical History:  Procedure Laterality Date   CATARACT EXTRACTION Right 2015   CATARACT EXTRACTION W/ INTRAOCULAR LENS IMPLANT  2013   left   COLONOSCOPY  2013   TA   EXTRACORPOREAL SHOCK WAVE LITHOTRIPSY Left 12/11/2017   Procedure: LEFT EXTRACORPOREAL SHOCK WAVE LITHOTRIPSY (ESWL);  Surgeon: Lucas Mallow, MD;  Location: [facility];  Service: Urology;  Laterality: Left;   KIDNEY STONE SURGERY      POLYPECTOMY      FHx:    Reviewed / unchanged  SHx:    Reviewed / unchanged   Systems Review:  Constitutional: Denies fever, chills, wt changes, headaches, insomnia, fatigue, night sweats, change in appetite. Eyes: Denies redness, blurred vision, diplopia, discharge, itchy, watery eyes.  ENT: Denies discharge, congestion, post nasal drip, epistaxis, sore throat, earache, hearing loss, dental pain, tinnitus, vertigo, sinus pain, snoring.  CV: Denies chest pain, palpitations, irregular heartbeat, syncope, dyspnea, diaphoresis, orthopnea, PND, claudication or edema. Respiratory: denies cough, dyspnea, DOE, pleurisy, hoarseness, laryngitis, wheezing.  Gastrointestinal: Denies dysphagia, odynophagia, heartburn, reflux, water brash, abdominal pain or cramps, nausea, vomiting, bloating, diarrhea, constipation, hematemesis, melena, hematochezia  or hemorrhoids. Genitourinary: Denies dysuria, frequency, urgency, nocturia, hesitancy, discharge, hematuria or flank pain. Musculoskeletal: Denies arthralgias, myalgias, stiffness, jt. swelling, pain, limping or strain/sprain.  Skin: Denies pruritus, rash, hives, warts, acne, eczema or change in skin lesion(s). Neuro: No weakness, tremor, incoordination, spasms, paresthesia or pain. Psychiatric: Denies confusion, memory loss or sensory loss. Endo: Denies change in weight, skin or hair change.  Heme/Lymph: No excessive bleeding, bruising or enlarged lymph nodes.  Physical Exam  BP 140/74   Pulse (!) 57   Temp 97.9 F (36.6 C)   Resp 16   Ht 5' 8.5" (1.74 m)   Wt 178 lb 12.8 oz (81.1 kg)   SpO2 97%   BMI 26.79 kg/m   Appears  well nourished, well groomed  and in no distress.  Eyes: PERRLA, EOMs, conjunctiva no swelling or erythema. Sinuses: No frontal/maxillary tenderness ENT/Mouth: EAC's clear, TM's nl w/o erythema, bulging. Nares clear w/o erythema, swelling, exudates. Oropharynx clear without erythema or exudates. Oral hygiene is good.  Tongue normal, non obstructing. Hearing intact.  Neck: Supple. Thyroid not palpable. Car 2+/2+ without bruits, nodes or JVD. Chest: Respirations nl with BS clear & equal w/o rales, rhonchi, wheezing or stridor.  Cor: Heart sounds normal w/ regular rate and rhythm without sig. murmurs, gallops, clicks or rubs. Peripheral pulses normal and equal  without edema.  Abdomen: Soft & bowel sounds normal. Non-tender w/o guarding, rebound, hernias, masses or organomegaly.  Lymphatics: Unremarkable.  Musculoskeletal: Full ROM all peripheral extremities, joint stability, 5/5 strength and normal gait.  Skin: Warm, dry without exposed rashes, lesions or ecchymosis apparent.  Neuro: Cranial  nerves intact, reflexes equal bilaterally. Sensory-motor testing grossly intact. Tendon reflexes grossly intact.  Pysch: Alert & oriented x 3.  Insight and judgement nl & appropriate. No ideations.  Assessment and Plan:  1. Essential hypertension  - Continue medication, monitor blood pressure at home.  - Continue DASH diet.  Reminder to go to the ER if any CP,  SOB, nausea, dizziness, severe HA, changes vision/speech.   - CBC with Differential/Platelet - COMPLETE METABOLIC PANEL WITH GFR - Magnesium - TSH  2. Hyperlipidemia associated with type 2 diabetes mellitus ([place])  - Continue diet/meds, exercise,& lifestyle modifications.  - Continue monitor periodic cholesterol/liver & renal functions     - Lipid panel  3. Type 2 diabetes mellitus with stage 2 chronic kidney  disease, without long-term current use of insulin (HCC)  - Continue diet, exercise  - Lifestyle modifications.  - Monitor appropriate labs   - Hemoglobin A1c - Insulin, random  4. Vitamin D deficiency  - Continue supplementation   - VITAMIN D 25 Hydroxy   5. Medication management  - CBC with Differential/Platelet - COMPLETE METABOLIC PANEL WITH GFR - Magnesium - Lipid panel - TSH - Hemoglobin A1c - Insulin, random - VITAMIN D  25 Hydroxy          Discussed  regular exercise, BP monitoring, weight control to achieve/maintain BMI less than 25 and discussed med and SE's. Recommended labs to assess and monitor clinical status with further disposition pending results of labs.  I discussed the assessment and treatment plan with the patient. The patient was provided an opportunity to ask questions and all were answered. The patient agreed with the plan and demonstrated an understanding of the instructions.  I provided over 30 minutes of exam, counseling, chart review and  complex critical decision making.        The patient was advised to call back or seek an in-person evaluation if the symptoms worsen or if the condition fails to improve as anticipated.   Kirtland Bouchard, MD

## 2021-07-08 NOTE — Patient Instructions (Signed)

## 2021-07-09 ENCOUNTER — Other Ambulatory Visit: Payer: Self-pay

## 2021-07-09 ENCOUNTER — Ambulatory Visit (INDEPENDENT_AMBULATORY_CARE_PROVIDER_SITE_OTHER): Payer: HMO | Admitting: Internal Medicine

## 2021-07-09 VITALS — BP 140/74 | HR 57 | Temp 97.9°F | Resp 16 | Ht 68.5 in | Wt 178.8 lb

## 2021-07-09 DIAGNOSIS — I10 Essential (primary) hypertension: Secondary | ICD-10-CM | POA: Diagnosis not present

## 2021-07-09 DIAGNOSIS — N182 Chronic kidney disease, stage 2 (mild): Secondary | ICD-10-CM

## 2021-07-09 DIAGNOSIS — E1122 Type 2 diabetes mellitus with diabetic chronic kidney disease: Secondary | ICD-10-CM | POA: Diagnosis not present

## 2021-07-09 DIAGNOSIS — Z23 Encounter for immunization: Secondary | ICD-10-CM | POA: Diagnosis not present

## 2021-07-09 DIAGNOSIS — E559 Vitamin D deficiency, unspecified: Secondary | ICD-10-CM | POA: Diagnosis not present

## 2021-07-09 DIAGNOSIS — E785 Hyperlipidemia, unspecified: Secondary | ICD-10-CM

## 2021-07-09 DIAGNOSIS — E1169 Type 2 diabetes mellitus with other specified complication: Secondary | ICD-10-CM | POA: Diagnosis not present

## 2021-07-09 DIAGNOSIS — Z79899 Other long term (current) drug therapy: Secondary | ICD-10-CM

## 2021-07-10 NOTE — Progress Notes (Signed)
============================================================ ============================================================  -    Glucose = 158 mg%   and   A1c - is 6.3%  - Both too high !   Being diabetic has a  300% increased risk for heart attack,  stroke, cancer, and alzheimer- type vascular dementia.   It is very important that you work harder with diet by  avoiding all foods that are white except chicken,   fish & calliflower.  - Avoid white rice  (brown & wild rice is OK),   - Avoid white potatoes  (sweet potatoes in moderation is OK),   White bread or wheat bread or anything made out of   white flour like bagels, donuts, rolls, buns, biscuits, cakes,  - pastries, cookies, pizza crust, and pasta (made from  white flour & egg whites)   - vegetarian pasta or spinach or wheat pasta is OK.  - Multigrain breads like Arnold's, Pepperidge Farm or   multigrain sandwich thins or high fiber breads like   [place] bread or "Dave's Killer" breads that are  4 to 5 grams fiber per slice !  are best.    Diet, exercise and weight loss can reverse and cure  diabetes in the early stages.    - Diet, exercise and weight loss is very important in the   control and prevention of complications of diabetes which  affects every system in your body, ie.   -Brain - dementia/stroke,  - eyes - glaucoma/blindness,  - heart - heart attack/heart failure,  - kidneys - dialysis,  - stomach - gastric paralysis,  - intestines - malabsorption,  - nerves - severe painful neuritis,  - circulation - gangrene & loss of a leg(s)  - and finally  . . . . . . . . . . . . . . . . . .    - cancer and Alzheimers. ============================================================ ============================================================  -  Vitamin D = 95  - Excellent   ============================================================ ============================================================

## 2021-07-12 LAB — LIPID PANEL
Cholesterol: 117 mg/dL (ref <200)
HDL: 49 mg/dL (ref >=40)
LDL Cholesterol (Calc): 55 mg/dL (calc)
Non-HDL Cholesterol (Calc): 68 mg/dL (calc) (ref <130)
Total CHOL/HDL Ratio: 2.4 (calc) (ref <5.0)
Triglycerides: 58 mg/dL (ref <150)

## 2021-07-12 LAB — CBC WITH DIFFERENTIAL/PLATELET
Absolute Monocytes: 344 cells/uL (ref 200–950)
Basophils Absolute: 21 cells/uL (ref 0–200)
Basophils Relative: 0.5 %
Eosinophils Absolute: 62 cells/uL (ref 15–500)
Eosinophils Relative: 1.5 %
HCT: 44 % (ref 38.5–50.0)
Hemoglobin: 14.3 g/dL (ref 13.2–17.1)
Lymphs Abs: 1041 cells/uL (ref 850–3900)
MCH: 29.6 pg (ref 27.0–33.0)
MCHC: 32.5 g/dL (ref 32.0–36.0)
MCV: 91.1 fL (ref 80.0–100.0)
MPV: 12 fL (ref 7.5–12.5)
Monocytes Relative: 8.4 %
Neutro Abs: 2632 cells/uL (ref 1500–7800)
Neutrophils Relative %: 64.2 %
Platelets: 133 10*3/uL — ABNORMAL LOW (ref 140–400)
RBC: 4.83 10*6/uL (ref 4.20–5.80)
RDW: 12.7 % (ref 11.0–15.0)
Total Lymphocyte: 25.4 %
WBC: 4.1 10*3/uL (ref 3.8–10.8)

## 2021-07-12 LAB — COMPLETE METABOLIC PANEL WITH GFR
AG Ratio: 2.4 (calc) (ref 1.0–2.5)
ALT: 10 U/L (ref 9–46)
AST: 10 U/L (ref 10–35)
Albumin: 4.3 g/dL (ref 3.6–5.1)
Alkaline phosphatase (APISO): 66 U/L (ref 35–144)
BUN: 18 mg/dL (ref 7–25)
CO2: 28 mmol/L (ref 20–32)
Calcium: 9.5 mg/dL (ref 8.6–10.3)
Chloride: 106 mmol/L (ref 98–110)
Creat: 1.14 mg/dL (ref 0.70–1.28)
Globulin: 1.8 g/dL (calc) — ABNORMAL LOW (ref 1.9–3.7)
Glucose, Bld: 158 mg/dL — ABNORMAL HIGH (ref 65–99)
Potassium: 3.9 mmol/L (ref 3.5–5.3)
Sodium: 142 mmol/L (ref 135–146)
Total Bilirubin: 0.7 mg/dL (ref 0.2–1.2)
Total Protein: 6.1 g/dL (ref 6.1–8.1)
eGFR: 65 mL/min/{1.73_m2} (ref >=60)

## 2021-07-12 LAB — HEMOGLOBIN A1C
Hgb A1c MFr Bld: 6.3 % of total Hgb — ABNORMAL HIGH (ref <5.7)
Mean Plasma Glucose: 134 mg/dL
eAG (mmol/L): 7.4 mmol/L

## 2021-07-12 LAB — VITAMIN D 25 HYDROXY (VIT D DEFICIENCY, FRACTURES): Vit D, 25-Hydroxy: 95 ng/mL (ref 30–100)

## 2021-07-12 LAB — MAGNESIUM: Magnesium: 1.7 mg/dL (ref 1.5–2.5)

## 2021-07-12 LAB — INSULIN, RANDOM: Insulin: 10.3 u[IU]/mL

## 2021-07-12 LAB — TSH: TSH: 2.02 mIU/L (ref 0.40–4.50)

## 2021-07-13 ENCOUNTER — Ambulatory Visit: Payer: HMO | Admitting: Pharmacist

## 2021-07-13 ENCOUNTER — Other Ambulatory Visit: Payer: Self-pay

## 2021-07-13 VITALS — BP 150/81 | HR 57 | Wt 179.6 lb

## 2021-07-13 DIAGNOSIS — N401 Enlarged prostate with lower urinary tract symptoms: Secondary | ICD-10-CM

## 2021-07-13 DIAGNOSIS — Z87891 Personal history of nicotine dependence: Secondary | ICD-10-CM

## 2021-07-13 DIAGNOSIS — E663 Overweight: Secondary | ICD-10-CM

## 2021-07-13 DIAGNOSIS — E1169 Type 2 diabetes mellitus with other specified complication: Secondary | ICD-10-CM

## 2021-07-13 DIAGNOSIS — K219 Gastro-esophageal reflux disease without esophagitis: Secondary | ICD-10-CM

## 2021-07-13 DIAGNOSIS — E1122 Type 2 diabetes mellitus with diabetic chronic kidney disease: Secondary | ICD-10-CM

## 2021-07-13 DIAGNOSIS — N1831 Chronic kidney disease, stage 3a: Secondary | ICD-10-CM

## 2021-07-13 DIAGNOSIS — I10 Essential (primary) hypertension: Secondary | ICD-10-CM

## 2021-07-13 DIAGNOSIS — N138 Other obstructive and reflux uropathy: Secondary | ICD-10-CM

## 2021-07-13 DIAGNOSIS — E559 Vitamin D deficiency, unspecified: Secondary | ICD-10-CM

## 2021-07-14 NOTE — Progress Notes (Signed)
Patient Visit with Chart  Edward [name] 48 years, Male  DOB: 03/31/1941  M: [phone] Care Team:  Imogene Burn, Trader   __________________________________________________ Summary: Pt is a pleasant male who presents to clinic today with wife. He is doing well and has no complaints today.  Recommendations/Changes made from today's visit: Pt BP elevated in office today, pt would benefit from ACE/ARB for BP control and dx of diabetes and kidney protection. Recommended checking BP at home to get a better sense of BP control and assess if pt should start antihypertensive med.  Plan: Will enroll pt into [facility] Have patient check BP and keep a log. Present to next provider visit.  GERD symptoms controlled, consider taper of PPI at next visit   Patient scheduled for CCM visit with the clinical pharmacist.  Patient is referred for CCM by their PCP and CPP is under general PCP supervision.: At least 2 of these conditions are expected to last 12 months or longer and patient is at significant risk for acute exacerbations and/or functional decline.  Patient has consented to participation in [facility] program. Visit Type: Clinic visit Date of Upcoming Visit: 07/13/2021  Patient's Chronic Conditions: Hypertension (HTN), Gastroesophageal Reflux Disease (GERD), Hyperlipidemia/Dyslipidemia (HLD), Diabetes (DM), Chronic Kidney Disease (CKD), Other, Benign Prostatic Hyperplasia (BPH) List Other Conditions (separated by comma): Erectile Dysfunction, Vitamin D deficiency  Were there PCP Visits in last 6 months?: Yes Visit #1: 07/09/2021- Dr. Melford Aase- Patient was seen for a follow up. Labs ordered, no medication changes.  Visit #2: 04/08/2021- Dr. Melford Aase- Patient was seen for follow-up. No medication changes, labs ordered.  Were there Specialist Visits in last 6 months?: No Was there a Hospital Visit in last 30 days?: No Were there other Hospital Visits in  last 6 months?: No  Medication Information Are there any Medication discrepancies?: No Are there any Medication adherence gaps (beyond 5 days past due)?: No Medication adherence rates for the STAR rating drugs: atorvastatin (LIPITOR) 40 MG tablet 90 tablet 2 10/25/2020   Sig: TAKE 1 TABLET BY MOUTH ONCE DAILY FOR CHOLESTEROL    List Patient's current Care Gaps: No current Care Gaps identified, Need A1c Documented  Pre-Call Questions (HC) Are you able to connect with Patient: Yes Visit Type: Clinic visit Confirm visit date/time: 3pm 07/14/2021 Have you seen any other providers since your last visit?: No Any changes in your medicines or health?: No Any side effects from any medications?: No Do you have any symptoms or problems not managed by your medications?: No What are your top 2 health concerns right now?: None- wife states that he is in good health. Always active and plays volleyball every Monday.  What are your top 2 medication concerns right now?: Other Details: None- wife states they have no out of pocket expenses. Has your Dr asked that you take BP, BG or special diet at home?: Monitor BG Do you get any type of exercise on a regular basis?: Yes What type of exercise and how often?: Volleyball, yard work - very active What are your top 1 or 2 goals for your health?: Stay healthy and active.  What are you doing to try to reach these goals?: Exercise, Monitoring BG, Taking medications What, if any, problems do you have getting your medications from the pharmacy?: None Is there anything else that you would like to discuss during the appointment?: No  Subjective Information Current BP: 140/74 Current HR: 57 taken on: 07/09/2021 Previous BP: 122/70 Previous  HR: 44 taken on: 04/08/2021 Weight: 178 BMI: 26.79 Last GFR: 65 taken on: 07/09/2021 Why did the patient present?: CCM Initial Visit Marital status?: Married Details: Married to Edward [name] Retired? 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 What does the patient do during the day?: Yard work, plays volleyball with church members at [name] every [name].  Who does the patient spend their time with and what do they do?: Spends most of his time with wife. They do yard work together every day. Lifestyle habits such as diet and exercise?: Exercise: Majority of exercise habits consists of yard work and work throughout [name]. They also work on their neighbors yard by [name] for 6-8 hours of the day.   Diet: Wife will cook breakfast, lunch and they usually eat leftovers for dinner. Diet consists of "country food" but he really eats whatever wife cooks. Alcohol, tobacco, and illicit drug usage?: None What is the patient's sleep pattern?: No sleep issues How many hours per night does patient typically sleep?: 6-8 Patient pleased with health care they are receiving?: Yes Family, occupational, and living circumstances relevant to overall health?: Patient has a son and brother that lives nearby. Overall, patient has a great support system and lives an active life Factors that may affect medication adherence?: Transportation Name and location of Current pharmacy: [facility] in [place] Current Rx insurance plan: HTA Are meds synced by current pharmacy?: No Are meds delivered by current pharmacy?: No - delivery not available Would patient benefit from direct intervention of clinical lead in dispensing process to optimize clinical outcomes?: Yes Are [facility] services available where patient lives?: Yes Is patient disadvantaged to use [facility]?: No [facility] services reviewed with patient and patient wishes to change pharmacy?: Yes - patient provided verbal consent to transfer prescriptions to [facility].  Patient made aware that CCM team is committed to working with any pharmacy and  patient is free to select the pharmacy of their choice that best meets their needs.  Patient also made aware they may discontinue use of [facility] at any time if they feel the service is not meeting their personal needs. Reasons patient wishes to change to [facility]: Packaging, Synchronization, Delivery of medication Dispensing Preference: Packaging 90 DS Any additional demeanor/mood notes?: Pt is a pleasant male who presents to clinic today with wife.   Hypertension (HTN) Assess this condition today?: Yes Is patient able to obtain BP reading today?: Yes BP today is: 150/81 Heart Rate is: 57 Goal: <130/80 mmHG Hypertension Stage: Stage 2 (SBP >140 or DBP > 90) Patient has tried and failed: Bystolic Is Patient checking BP at home?: No How often does patient miss taking their blood pressure medications?: Pt is not on any BP lowering medications Has patient experienced hypotension, dizziness, falls or bradycardia?: No Check present secondary causes (below) for HTN: Obesity, CKD BP RPM device: Does patient qualify?: Yes We discussed: DASH diet:  following a diet emphasizing fruits and vegetables and low-fat dairy products along with whole grains, fish, poultry, and nuts. Reducing red meats and sugars., Reducing the amount of salt intake to 1500mg /per day., Getting enough potassium in your diet equaling 3500-5000mg /day.  This helps to regulate BP by balancing out the effects of salt., Proper Home BP Measurement Assessment:: Uncontrolled Drug: Diet/and lifestyle Assessment: Appropriate, Query Effectiveness Plan to Order: BP RPM Plan to Counsel: Start checking BP daily and record  BP readings. HC Follow up: BP Assessment in 1 month Pharmacist Follow up: Review BP Assessment in 1 month, will strongly consider starting patient on an ACE/ARB for BP control if BP consistently elevated > 140/80, and check labs in 2 weeks after starting  Hyperlipidemia/Dyslipidemia (HLD) Last Lipid  panel on: 07/09/2021 TC (Goal<200): 117 LDL: 55 HDL (Goal>40): 49 TG (Goal<150): 58 ASCVD 10-year risk?is:: N/A due to lab values outside the range Assess this condition today?: Yes LDL Goal: <100 Has patient tried and failed any HLD Medications?: No Check present secondary causes (below) that can lead to increased cholesterol levels (multi-choice optional): Diabetes, CKD We discussed: Encouraged increasing fiber to a daily intake of 10-25g/day, How a diet high in plant sterols (fruits/vegetables/nuts/whole grains/legumes) may reduce your cholesterol. Assessment:: Controlled Drug: Atorvastatin 40mg  QD Assessment: Appropriate, Effective, Safe, Accessible HC Follow up: N/A, pt controlled Pharmacist Follow up: Will reassess lipid levels at next visit  Diabetes (DM) Current A1C: 6.3 taken on: 07/09/2021 Previous A1C: 6.5 taken on: 04/08/2021 Type: 2 The current microalbumin ratio is: 9 tested on: 01/07/2021 Assess this condition today?: Yes Goal A1C: < 7.0 % Type: 2 Is Patient taking statin medication?: Yes Is patient taking ACEi / ARB?: No Reason patient is not taking ACEi / ARB: Pt has not tried for BP reduction yet. DM RPM device: Does patient qualify?: Yes Patient checking Blood Sugar at home?: Yes Does patient use a Continuous Glucose Monitoring (CGM) device?: No How often does patient check Blood Sugars?: couple times per week Recent fasting Blood sugar reading: 115-130 Has patient experienced any hypoglycemic episodes?: Yes Details: This happened a few weeks ago, and patient had candy and rechecked blood sugar and it returned back to normal. Diet recall discussed: No We discussed: How to recognize and treat signs of hypoglycemia., Low carbohydrate eating plan with an emphasis on whole grains, legumes, nuts, fruits, and vegetables and minimal refined and processed foods., Modifying lifestyle, including to participate in moderate physical activity (e.g., walking) at least 150  minutes per week. Assessment:: Controlled Drug: Metformin 500mg  TID Assessment: Appropriate, Effective, Safe, Accessible Additional Info: Patient could benefit from increased dose of metformin, however pt is more interested in continuing this dose and continuing to work on lifestyle modifications. Pt had a Hx of erectile dysfunction. With blood sugar levels going down he endorses that he no longer has this issue and no longer needs to take sildenafil. HC Follow up: N/A Pharmacist Follow up: Reassess A1c at next visit  GERD Assess this condition today?: Yes How frequently do you have symptoms of GERD or reflux?: occasionally Does patient experience difficulty or pain with swallowing?: No Does patient experience any of the following?: Frequent burping What OTC medications have you tried and what was the response?: Nexium, but it was too expensive OTC for the pt, so switched to Protonix Famotidine 40mg  is working for this patient Is the patient a smoker or exposed to second-hand smoke?: No How long have you taken prescription medications for GERD?: Protonix since 2014 We discussed: Identifying and avoiding / limiting trigger foods, Wearing loose fitting clothes, Eating smaller, more frequent meals, Remaining upright for 2-3 hours after eating and avoiding eating for 2-3 hours before bedtime Assessment:: Controlled Drug: Protonix 40mg  QD Assessment: Appropriate, Effective, Safe, Accessible Drug: Famotidine 40mg  HS Assessment: Appropriate, Effective, Safe, Accessible HC Follow up: N/A Pharmacist Follow up: Follow up with pt on GERD symptoms, at next visit. Consider slow taper of Protonix as pt symptoms are controlled  BPH Current  PSA: 2.63 taken on: 01/07/2021 Assess this condition today?: Yes Are you experiencing any side effects from your BPH medication?: None What changes have you made to your diet / lifestyle to help manage your BPH symptoms?: Reducing liquid consumption for 2 hours  before bedtime, Taking extra time to ensure complete voiding of the bladder (i.e. waiting a few moments then trying to void again) Completing BPH AUA Questionnaire today?: No How would you feel if you had to live with your urinary condition the way it is now, no better, no worse, for the rest of your life?: Delighted We discussed: Double voiding, Limit fluid intake for 2-3 hours before bedtime Assessment:: Controlled Drug: Tamsulosin 0.4mg  QD Assessment: Appropriate, Effective, Safe, Accessible HC Follow up: N/A Pharmacist Follow up: Follow/up side effects and symptoms with pt at next visit  Chronic kidney disease (CKD) Previous GFR: 65 taken on: 07/09/2021 The current microalbumin ratio is: 9 tested on: 01/07/2021 Assess this condition today?: Yes CKD Stage: Stage 2 (GFR 61-90 ml/min) Albuminuria Stage: A1 (<30) Contributing factors for developing CKD: Diabetes, HTN Is Patient taking statin medication: Yes Is patient taking ACEi / ARB?: No Renal dose adjustments recommended?: No We discussed: Limiting dietary sodium intake to less than 2000 mg / day, Maintaining blood pressure control, Maintaining blood glucose control, Avoidance of nephrotoxic drugs (NSAIDs) Assessment:: Controlled Drug: N/A Assessment: Query need Additional Info: Pt would benefit from ACEi or ARB for renal protection, BP and diabetes. I will consider starting this medication for this patient after reviewing BP RPM  HC Follow up: N/A Pharmacist Follow up: Will follow up with patient in 1 month to assess BP readings and consider starting an ACEi or ARB if indicated.  Tobacco Use Disorder Assess this condition today?: Yes Is patient currently Smoking or Vaping?: No Did patient smoke/vape before?: Yes Details on prior smoking/vaping history: Former smoker, quit in [place]:: Controlled Drug: N/A Assessment: Appropriate, Effective, Safe, Accessible HC Follow up: N/A Pharmacist Follow up: N/A  Exercise,  Diet and Non-Drug Coordination Needs Additional exercise counseling points. We discussed: targeting at least 150 minutes per week of moderate-intensity aerobic exercise. Additional diet counseling points. We discussed: key components of the DASH diet, aiming to consume at least 8 cups of water day Discussed Non-Drug Care Coordination Needs: Yes Does Patient have Medication financial barriers?: No  Accountable Health Communities Health-Related Social Needs Screening Tool -  SDOH  ([email or web address]) What is your living situation today? (ref #1): I have a steady place to live Think about the place you live. Do you have problems with any of the following? (ref #2): None of the above Within the past 12 months, you worried that your food would run out before you got money to buy more (ref #3): Never true Within the past 12 months, the food you bought just didn't last and you didn't have money to get more (ref #4): Never true In the past 12 months, has lack of reliable transportation kept you from medical appointments, meetings, work or from getting things needed for daily living? (ref #5): No In the past 12 months, has the electric, gas, oil, or water [name] threatened to shut off services in your home? (ref #6): No How often does anyone, including family and friends, physically hurt you? (ref #7): Never (1) How often does anyone, including family and friends, insult or talk down to you? (ref #8): Never (1) How often does anyone, including friends and family, threaten you with harm? (ref #9): Never (1) How  often does anyone, including family and friends, scream or curse at you? (ref #10): Never (1)  Engagement Notes Newton Pigg on 07/14/2021 10:55 AM CPP chart review: 30 min  CPP office visit: 40 min  CPP office visit documentation: 67 min CPP Coordination of Care: 0 min [facility] Care Plan Completion:10 min CPP Care Plan Review: 12  min  Engagement Notes Newton Pigg on 07/14/2021 10:58 AM HC F/u:&nbsp;  BP Assessment in 1 month [place]   Pharmacist F/u:&nbsp;  MD visit 01/11/22 (Dr. Melford Aase) [place] 04/08/22 Caryl Pina) F/u visit 04/19/22 - reasses lipids, A1c, GERD symptoms, consider slow taper   CARE PLAN  Patient Name: Xzayvion Vaeth Chandler DOB:??[birth year]  Last Care Plan Update:?07/13/2021  COMPREHENSIVE CARE PLAN AND GOALS?   HYPERTENSION?   MOST RECENT BLOOD PRESSURE:? Current BP: 140/74  Current HR: 57  taken on: 07/09/2021  Previous BP: 122/70  Previous HR: 44  taken on: 04/08/2021  CURRENT BLOOD PRESSURE:  BP today is: 150/81  Heart Rate is: 57   MY GOAL BLOOD PRESSURE:?<130/80  CURRENT MEDICATION AND DOSING:?   None  THE GOALS WE HAVE CHOSEN ARE:??  Check your blood pressure every day and record it in your log. If consistently greater than 130/80 call pharmacy team or provider.  Targeting  150 minutes of aerobic activity per week, Reducing the amount of salt intake to 1500mg /per day., Weight reduction- We discussed losing 5-10% of body weight., Proper Home BP Measurement  Maintain an at goal blood pressure?   BARRIERS TO ACHIEVING GOALS: Diet and lifestyle   PLAN TO WORK ON THESE GOALS:?   Check BP at home?every day and keep a log. Bring to your doctor and pharmacist appointments  Reduce salt intake (< 1500mg / day)?  Diet: DASH diet (Choose fruits, vegetables, and low-fat dairy products. Increase whole grains, fish, poultry, nuts. Reduce red meats and sugars)?  Weight: 1 kg = ~1 mmHg reduction?  Exercise?    CHOLESTEROL?   MOST RECENT LABS:????07/09/2021  TOTAL CHOLESTEROL:?117  TRIGLYCERIDES:?58  HDL:?49  LDL:?55  CURRENT MEDICATION AND DOSING:?  Drug: Atorvastatin 40mg  daily   THE GOALS WE HAVE CHOSEN ARE:??   Total Cholesterol goal under 200, Triglycerides goal under 150, HDL goal above 40, LDL goal under 100  BARRIERS TO ACHIEVING GOALS:? controlled   PLAN TO WORK ON  THESE GOALS:?   Take medication regularly?  Diet: high in plant sterols (fruits/ vegetables/ nuts/ whole grains/ legumes). Increase fiber intake (10-25g/day). Avoid foods high in cholesterol (red meat, egg yolks, dairy, oils/ butter). Choose low-fat options.?  Exercise?  Weight Management?    DIABETES?   MOST RECENT A1C:????6.3% on 07/09/2021  MY GOAL A1C:?<7%  LAST FOOT EXAM:?  LAST EYE EXAM:?  CURRENT MEDICATION AND DOSING:?  Metformin 500mg  three times a day   THE GOALS WE HAVE CHOSEN ARE:??  Maintain an at goal A1C level?   BARRIERS TO ACHIEVING GOALS: Controlled   PLAN TO WORK ON THESE GOALS:?   -Take medications as prescribed?  -Check blood glucose 2 to 3 times a week or whenever you have symptoms of low blood sugar or high blood sugar.   -Diet: natural carbs and sugars (vegetables, fruits, whole grains, legumes, dairy), avoid alcohol, reduce carbohydrate intake and processed sugars. Maintain a low carbohydrate diet, eating 45 grams of carbohydrates per meal (3 servings of carbohydrates per meal), 15 grams of carbohydrates per snack (1 serving of carbohydrate per snack)?  -Weight Loss: waist circumference goal < 35 inches for females; < 40  inches for males?  -Exercise: 150 minutes of moderate-intensity aerobic activity per week (at least 3 days)?   -Foot care: wash, dry examine feet daily. Moisturize the top and bottom (not in between). Trim nails with nail file (no sharp edges). Wear socks and shoes. Elevate feet when sitting. Annual foot exam by podiatrist.?   Recognize sign and symptoms of hypoglycemia and understand treatment as outlined below?   -Hypoglycemia sign and symptoms: dizziness, anxiety/ irritability, shakiness, headache, diaphoresis, hunger, confusion, nausea, ataxia, tremors, palpitations/ tachycardia, blurred vision?  -Hypoglycemia Treatment: "Rule of 15": ?  (1) 15-20 grams of glucose/ simple carb (4 oz juice, 8oz milk, 4oz regular soda, 1 tablespoon sugar/  honey/ corn syrup, 3-4 glucose tabs/ 1 serving glucose gel)?  (2) Recheck BG after 15 mins. If hypoglycemia continues?  (3) Repeat steps 1&2, when BG normalizes, eat small meal or snack?      GERD   CURRENT REGIMEN AND DOSING:   Protonix 40mg  daily  Famotidine 40mg  every night   THE GOALS WE HAVE CHOSEN ARE:  Minimize reflux symptoms.   BARRIERS TO ACHIEVING GOALS: Controlled   PLAN TO WORK ON THESE GOALS:     -Educated on ways to prevent acid reflux, such as avoid spicy foods, smaller portion sizes, wearing loose clothing, avoiding caffeine, and to raise head of bed.        Chronic Kidney Disease?   Labs:?   Previous GFR: 65 taken on: 07/09/2021  The current microalbumin ratio is: 9 tested on: 01/07/2021 CKD Stage: Stage 2 (GFR 61-90 ml/min)  Albuminuria Stage: A1 (<30)   PLAN TO WORK ON THESE GOALS:???   -Maintaining normal BP and BG?  -Diet: Decrease salt and fatty foods. Increase fruit and vegetables?    BPH   CURRENT REGIMEN AND DOSING:  Drug: Tamsulosin 0.4mg  daily   THE GOALS WE HAVE CHOSEN ARE:   Double voiding, Limit fluid intake for 2-3 hours before bedtime   BARRIERS TO ACHIEVING GOALS: Controlled   PLAN TO WORK ON THESE GOALS:  Reducing liquid consumption for 2 hours before bedtime, Take extra time to ensure complete voiding of the bladder (i.e. waiting a few moments then trying to void again)      ACTIVE MEDICATION LIST?   Your current medication list has been updated. To view, log in to your patient portal.?   Call if any changes need to be made.?    ?  MEDICATION REVIEW?  MEDICATION REVIEW CONDUCTED:?? Yes?  DATE:??? 07/13/2021 BEST POSSIBLE MEDICATION HISTORY?  SOURCE:?? Medical Records?   ?  HOW DO I? - WHEN DO I??   GET AHOLD OF MY DOCTOR DURING BUSINESS HOURS WHEN THE OFFICE IS OPEN?  PHONE NUMBER: [phone]?    AFTER HOURS [facility] N[facility]SE WHEN THE OFFICE IS CLOSED?? PHONE: [phone]?   TALK TO MY CARE COORDINATOR??  NAME: Newton Pigg   PHONE: [phone]   EMAIL:??   [email or web address]?   CARE COORDINATOR STAFF?  Rosary Lively, [address], [place], [place]?  Contact Phone Number: [phone]?      NOTE SECTION  Thank you for participating in the Chronic Care Management (CCM) program with Dr. Melford Aase    This program takes a proactive approach to your health and my team will serve as a resource for you throughout the year. Please follow up at [phone] if you have any questions or experience changes to your overall health. Your next CCM appointment will be conducted with Newton Pigg, PharmD as follows:    Date  April 19, 2022  Time  3:30 pm  Over the phone

## 2021-08-11 DIAGNOSIS — I10 Essential (primary) hypertension: Secondary | ICD-10-CM | POA: Diagnosis not present

## 2021-08-11 DIAGNOSIS — E1122 Type 2 diabetes mellitus with diabetic chronic kidney disease: Secondary | ICD-10-CM | POA: Diagnosis not present

## 2021-08-11 DIAGNOSIS — E663 Overweight: Secondary | ICD-10-CM | POA: Diagnosis not present

## 2021-08-11 DIAGNOSIS — N1831 Chronic kidney disease, stage 3a: Secondary | ICD-10-CM | POA: Diagnosis not present

## 2021-08-17 ENCOUNTER — Ambulatory Visit (INDEPENDENT_AMBULATORY_CARE_PROVIDER_SITE_OTHER): Payer: HMO | Admitting: Adult Health

## 2021-08-17 ENCOUNTER — Encounter: Payer: Self-pay | Admitting: Adult Health

## 2021-08-17 ENCOUNTER — Other Ambulatory Visit: Payer: Self-pay

## 2021-08-17 VITALS — BP 158/82 | HR 53 | Temp 97.7°F | Wt 179.4 lb

## 2021-08-17 DIAGNOSIS — H1010 Acute atopic conjunctivitis, unspecified eye: Secondary | ICD-10-CM | POA: Diagnosis not present

## 2021-08-17 DIAGNOSIS — I10 Essential (primary) hypertension: Secondary | ICD-10-CM

## 2021-08-17 DIAGNOSIS — J069 Acute upper respiratory infection, unspecified: Secondary | ICD-10-CM

## 2021-08-17 DIAGNOSIS — J309 Allergic rhinitis, unspecified: Secondary | ICD-10-CM

## 2021-08-17 MED ORDER — FLUTICASONE PROPIONATE 50 MCG/ACT NA SUSP: NASAL | 1 refills | Status: DC

## 2021-08-17 MED ORDER — PROMETHAZINE-DM 6.25-15 MG/5ML PO SYRP
5.0000 mL | ORAL_SOLUTION | Freq: Four times a day (QID) | ORAL | 1 refills | Status: DC | PRN
Start: 2021-08-17 — End: 2021-10-14

## 2021-08-17 NOTE — Patient Instructions (Signed)
Continue allegra or other daily allergy med  Saline irrigations of nose and wash face after coming in from outdoors  Looks like getting over a virus but also some allergies   START FLONASE NASAL SPRAYS  Medicines you can use  Nasal congestion [employer] saline spray (aerosol/mist)- can try this, it is in the kids section - pseudoephedrine (Sudafed)- behind the counter, do not use if you have high blood pressure, medicine that have -D in them. - phenylephrine (Sudafed PE) -Dextormethorphan + chlorpheniramine (Coridcidin HBP)- okay if you have high blood pressure -Oxymetazoline (Afrin) nasal spray- LIMIT to 3 days -Saline nasal spray -Neti pot (used distilled or bottled water)  Ear pain/congestion -pseudoephedrine (sudafed) - Nasonex/flonase nasal spray  Fever -Acetaminophen (Tyelnol) -Ibuprofen (Advil, motrin, aleve)  Sore Throat -Acetaminophen (Tyelnol) -Ibuprofen (Advil, motrin, aleve) -Drink a lot of water -Gargle with salt water - Rest your voice (don't talk) -Throat sprays -Cough drops  Body Aches -Acetaminophen (Tyelnol) -Ibuprofen (Advil, motrin, aleve)  Headache -Acetaminophen (Tyelnol) -Ibuprofen (Advil, motrin, aleve) - Exedrin, Exedrin Migraine  Allergy symptoms (cough, sneeze, runny nose, itchy eyes) -Claritin or loratadine cheapest but likely the weakest -Zyrtec or certizine at night because it can make you sleepy -The strongest is allegra or fexafinadine Cheapest at [employer], [employer], [employer]  Cough -Dextromethorphan (Delsym)- medicine that has DM in it -Guafenesin (Mucinex/Robitussin) - cough drops - drink lots of water  Chest Congestion -Guafenesin (Mucinex/Robitussin)  Red Itchy Eyes - Naphcon-A  Upset Stomach - Bland diet (nothing spicy, greasy, fried, and high acid foods like tomatoes, oranges, berries) -OKAY- cereal, bread, soup, crackers, rice -Eat smaller more frequent meals -reduce caffeine, no alcohol -Loperamide  (Imodium-AD) if diarrhea -Prevacid for heart burn  General health when sick -Hydration -wash your hands frequently -keep surfaces clean -change pillow cases and sheets often -Get fresh air but do not exercise strenuously -Vitamin D, double up on it - Vitamin C -Zinc              HYPERTENSION INFORMATION  Monitor your blood pressure at home, please keep a record and bring that in with you to your next office visit.   Go to the ER if any CP, SOB, nausea, dizziness, severe HA, changes vision/speech  Testing/Procedures: HOW TO TAKE YOUR BLOOD PRESSURE: Rest 5 minutes before taking your blood pressure. Don't smoke or drink caffeinated beverages for at least 30 minutes before. Take your blood pressure before (not after) you eat. Sit comfortably with your back supported and both feet on the floor (don't cross your legs). Elevate your arm to heart level on a table or a desk. Use the proper sized cuff. It should fit smoothly and snugly around your bare upper arm. There should be enough room to slip a fingertip under the cuff. The bottom edge of the cuff should be 1 inch above the crease of the elbow.  Your most recent BP: BP: (!) 158/82   Take your medications faithfully as instructed. Maintain a healthy weight. Get at least 150 minutes of aerobic exercise per week. Minimize salt intake. Minimize alcohol intake  DASH Eating Plan DASH stands for "Dietary Approaches to Stop Hypertension." The DASH eating plan is a healthy eating plan that has been shown to reduce high blood pressure (hypertension). Additional health benefits may include reducing the risk of type 2 diabetes mellitus, heart disease, and stroke. The DASH eating plan may also help with weight loss. WHAT DO I NEED TO KNOW ABOUT THE DASH EATING PLAN? For the DASH eating  plan, you will follow these general guidelines: Choose foods with a percent daily value for sodium of less than 5% (as listed on the food  label). Use salt-free seasonings or herbs instead of table salt or sea salt. Check with your health care provider or pharmacist before using salt substitutes. Eat lower-sodium products, often labeled as "lower sodium" or "no salt added." Eat fresh foods. Eat more vegetables, fruits, and low-fat dairy products. Choose whole grains. Look for the word "whole" as the first word in the ingredient list. Choose fish and skinless chicken or [place] more often than red meat. Limit fish, poultry, and meat to 6 oz (170 g) each day. Limit sweets, desserts, sugars, and sugary drinks. Choose heart-healthy fats. Limit cheese to 1 oz (28 g) per day. Eat more home-cooked food and less restaurant, buffet, and fast food. Limit fried foods. Cook foods using methods other than frying. Limit canned vegetables. If you do use them, rinse them well to decrease the sodium. When eating at a restaurant, ask that your food be prepared with less salt, or no salt if possible. WHAT FOODS CAN I EAT? Seek help from a dietitian for individual calorie needs. Grains Whole grain or whole wheat bread. Brown rice. Whole grain or whole wheat pasta. Quinoa, bulgur, and whole grain cereals. Low-sodium cereals. Corn or whole wheat flour tortillas. Whole grain cornbread. Whole grain crackers. Low-sodium crackers. Vegetables Fresh or frozen vegetables (raw, steamed, roasted, or grilled). Low-sodium or reduced-sodium tomato and vegetable juices. Low-sodium or reduced-sodium tomato sauce and paste. Low-sodium or reduced-sodium canned vegetables.  Fruits All fresh, canned (in natural juice), or frozen fruits. Meat and Other Protein Products Ground beef (85% or leaner), grass-fed beef, or beef trimmed of fat. Skinless chicken or [place]. Ground chicken or [place]. Pork trimmed of fat. All fish and seafood. Eggs. Dried beans, peas, or lentils. Unsalted nuts and seeds. Unsalted canned beans. Dairy Low-fat dairy products, such as skim or 1%  milk, 2% or reduced-fat cheeses, low-fat ricotta or cottage cheese, or plain low-fat yogurt. Low-sodium or reduced-sodium cheeses. Fats and Oils Tub margarines without trans fats. Light or reduced-fat mayonnaise and salad dressings (reduced sodium). Avocado. Safflower, olive, or canola oils. Natural peanut or almond butter. Other Unsalted popcorn and pretzels. The items listed above may not be a complete list of recommended foods or beverages. Contact your dietitian for more options. WHAT FOODS ARE NOT RECOMMENDED? Grains White bread. White pasta. White rice. Refined cornbread. Bagels and croissants. Crackers that contain trans fat. Vegetables Creamed or fried vegetables. Vegetables in a cheese sauce. Regular canned vegetables. Regular canned tomato sauce and paste. Regular tomato and vegetable juices. Fruits Dried fruits. Canned fruit in light or heavy syrup. Fruit juice. Meat and Other Protein Products Fatty cuts of meat. Ribs, chicken wings, bacon, sausage, bologna, salami, chitterlings, fatback, hot dogs, bratwurst, and packaged luncheon meats. Salted nuts and seeds. Canned beans with salt. Dairy Whole or 2% milk, cream, half-and-half, and cream cheese. Whole-fat or sweetened yogurt. Full-fat cheeses or blue cheese. Nondairy creamers and whipped toppings. Processed cheese, cheese spreads, or cheese curds. Condiments Onion and garlic salt, seasoned salt, table salt, and sea salt. Canned and packaged gravies. Worcestershire sauce. Tartar sauce. Barbecue sauce. Teriyaki sauce. Soy sauce, including reduced sodium. Steak sauce. Fish sauce. Oyster sauce. Cocktail sauce. Horseradish. Ketchup and mustard. Meat flavorings and tenderizers. Bouillon cubes. Hot sauce. Tabasco sauce. Marinades. Taco seasonings. Relishes. Fats and Oils Butter, stick margarine, lard, shortening, ghee, and bacon fat. Coconut, palm kernel, or  palm oils. Regular salad dressings. Other Pickles and olives. Salted popcorn and  pretzels. The items listed above may not be a complete list of foods and beverages to avoid. Contact your dietitian for more information. WHERE CAN I FIND MORE INFORMATION? National Heart, [employer], and [employer] Institute: travelstabloid.com Document Released: 08/18/2011 Document Revised: 01/13/2014 Document Reviewed: 07/03/2013 [facility] Patient Information 2015 [place], [place]. This information is not intended to replace advice given to you by your health care provider. Make sure you discuss any questions you have with your health care provider.

## 2021-08-17 NOTE — Progress Notes (Signed)
Assessment and Plan:  Edward [name] was seen today for acute visit.  Diagnoses and all orders for this visit:  URI with cough and congestion Suspect had viral, resolving - benign exam Discussed the importance of avoiding unnecessary antibiotic therapy. Suggested symptomatic OTC remedies. Nasal saline spray for congestion. Nasal steroids, allergy pill, oral steroids offered Follow up as needed if not resolving or with any new worsening sx, fever/chills.  -     promethazine-dextromethorphan (PROMETHAZINE-DM) 6.25-15 MG/5ML syrup; Take 5 mLs by mouth 4 (four) times daily as needed for cough.   Allergic rhinoconjunctivitis Allergic hygiene reivewed; continue allegra-  -     fluticasone (FLONASE) 50 MCG/ACT nasal spray; 1-2 Sprays in each nostril once daily for allergies and runny nose.   Essential hypertension Persistently elevated on recheck; not currently on med Monitor blood pressure at home daily and return for recheck in 2 weeks Call if consistently over 130/80 Plan losartan if remains 140/80+ Reviewed DASH diet and given handout Reminder to go to the ER if any CP, SOB, nausea, dizziness, severe HA, changes vision/speech, left arm numbness and tingling and jaw pain.   Further disposition pending results of labs. Discussed med's effects and SE's.   Over 20 minutes of exam, counseling, chart review, and critical decision making was performed.   Future Appointments  Date Time Provider [place]  10/14/2021 11:30 AM Magda Bernheim, NP [facility] None  01/11/2022 11:00 AM Unk Pinto, MD [facility] None  04/08/2022 11:00 AM Liane Comber, NP [facility] None  04/19/2022  3:00 PM Newton Pigg, RPH [facility] None    ------------------------------------------------------------------------------------------------------------------   HPI BP (!) 158/82   Pulse (!) 53   Temp 97.7 F (36.5 C)   Wt 179 lb 6.4 oz (81.4 kg)   SpO2 98%   BMI 26.88 kg/m  80 y.o.male presents for  evaluation of 1.5 weeks of cough with new watery eyes and rhinitis.   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 He has been taking dayquil/nyquil with some benefit, he feels cough and chest congestion is loosening up (recently productive of thick white phlegm) but wife called to make this appointment.   He reports mild intermittent HA, resolves with tylenol as needed.  He denies fever/chills, chest or back aching, fatigue, dyspnea. Denies sore throat, sinus tenderness.   He did have flu vaccine for this season.   Past Medical History:  Diagnosis Date   BPH (benign prostatic hyperplasia)    Cataract    surgical removal bilateral   Diabetes mellitus without complication ([place])    ED (erectile dysfunction)    GERD (gastroesophageal reflux disease)    History of adenomatous polyp of colon 11/02/2017   Tubular adenoma 2013; recommended 5 year follow up   History of kidney stones    Hyperlipidemia    Hypertension    IBS (irritable bowel syndrome)    Kidney stones 02/11/2020   LBBB (left bundle branch block)    Personal history of kidney stones    Pre-diabetes    Prediabetes    Vitamin D deficiency      Allergies  Allergen Reactions   Bystolic [Nebivolol Hcl]     Current Outpatient Medications on File Prior to Visit  Medication Sig   APPLE CIDER VINEGAR PO Take 1-2 tablets by mouth  daily.   aspirin 81 MG tablet Take 81 mg by mouth 3 (three) times a week.   atorvastatin (LIPITOR) 40 MG tablet TAKE 1 TABLET BY MOUTH ONCE DAILY FOR CHOLESTEROL   Blood Glucose Monitoring Suppl DEVI Use device to test blood sugar once daily   Cholecalciferol (VITAMIN D PO) Take 5,000 Units by mouth daily.   CINNAMON PO Take 1-2 tablets by mouth daily.   Cyanocobalamin (VITAMIN B 12  PO) Take 1 tablet by mouth daily.   famotidine (PEPCID) 40 MG tablet Take  1 tablet  at Bedtime  to Prevent Heartburn & Indigestion / Patient knows to take by mouth   glucose blood (ONETOUCH VERIO) test strip USE TO TEST BLOOD SUGAR ONCE DAILY   Lancets MISC Test blood sugar once daily   metFORMIN (GLUCOPHAGE) 500 MG tablet Take  1 tablet  3 x /day  with Meals  for Diabetes   OMEGA-3 FATTY ACIDS PO Take 700 mg by mouth daily.   pantoprazole (PROTONIX) 40 MG tablet Take  1 tablet  Daily  to Prevent Heartburn & Indigestion   tamsulosin (FLOMAX) 0.4 MG CAPS capsule Take  1 capsule  Daily  for Prostate   zinc gluconate 50 MG tablet Take 50 mg by mouth daily.   No current facility-administered medications on file prior to visit.    ROS: all negative except above.   Physical Exam:  BP (!) 158/82   Pulse (!) 53   Temp 97.7 F (36.5 C)   Wt 179 lb 6.4 oz (81.4 kg)   SpO2 98%   BMI 26.88 kg/m   General Appearance: Well nourished, in no apparent distress. Eyes: PERRLA, EOMs, conjunctiva no swelling or erythema Sinuses: No Frontal/maxillary tenderness ENT/Mouth: Ext aud canals clear, TMs without erythema, bulging. No erythema, swelling, or exudate on post pharynx. Soft pallet pale with cobblestoning Tonsils not swollen or erythematous. Hearing normal.  Neck: Supple, thyroid normal.  Respiratory: Respiratory effort normal, BS equal bilaterally without rales, rhonchi, wheezing or stridor.  Cardio: RRR with no MRGs. Brisk peripheral pulses without edema.  Abdomen: Soft, + BS.  Non tender, no guarding, rebound, hernias, masses. Lymphatics: Non tender without lymphadenopathy.  Musculoskeletal: normal gait.  Skin: Warm, dry without rashes, lesions, ecchymosis.  Neuro: Normal muscle tone Psych: Awake and oriented X 3, normal affect, Insight and Judgment appropriate.     Izora Ribas, NP 2:18 PM [facility]

## 2021-08-31 ENCOUNTER — Other Ambulatory Visit: Payer: Self-pay

## 2021-08-31 ENCOUNTER — Ambulatory Visit (INDEPENDENT_AMBULATORY_CARE_PROVIDER_SITE_OTHER): Payer: HMO

## 2021-08-31 ENCOUNTER — Encounter: Payer: Self-pay | Admitting: Adult Health

## 2021-08-31 ENCOUNTER — Other Ambulatory Visit: Payer: Self-pay | Admitting: Adult Health

## 2021-08-31 DIAGNOSIS — I10 Essential (primary) hypertension: Secondary | ICD-10-CM | POA: Diagnosis not present

## 2021-08-31 MED ORDER — LOSARTAN POTASSIUM 50 MG PO TABS: ORAL_TABLET | ORAL | 3 refills | Status: DC

## 2021-08-31 NOTE — Progress Notes (Signed)
Patient presents today for a Nurse Visit to recheck his BP. I asked him if he had any changes to the way he was taking his medication and he stated no change. He also brought in hid BP log. Most of his ranges were 130's-140's/ 60's-70's. The highest one was December 17th at 11:30am which was 156/76. He did take his BP throughout the day, not all at the same time of day.

## 2021-09-08 ENCOUNTER — Other Ambulatory Visit: Payer: Self-pay | Admitting: Nurse Practitioner

## 2021-09-08 DIAGNOSIS — N401 Enlarged prostate with lower urinary tract symptoms: Secondary | ICD-10-CM

## 2021-09-08 DIAGNOSIS — K219 Gastro-esophageal reflux disease without esophagitis: Secondary | ICD-10-CM

## 2021-09-08 DIAGNOSIS — I10 Essential (primary) hypertension: Secondary | ICD-10-CM

## 2021-09-08 DIAGNOSIS — E785 Hyperlipidemia, unspecified: Secondary | ICD-10-CM

## 2021-09-08 DIAGNOSIS — E1122 Type 2 diabetes mellitus with diabetic chronic kidney disease: Secondary | ICD-10-CM

## 2021-09-08 MED ORDER — METFORMIN HCL 500 MG PO TABS: ORAL_TABLET | ORAL | 3 refills | Status: DC

## 2021-09-08 MED ORDER — PANTOPRAZOLE SODIUM 40 MG PO TBEC: DELAYED_RELEASE_TABLET | ORAL | 3 refills | Status: DC

## 2021-09-08 MED ORDER — LOSARTAN POTASSIUM 50 MG PO TABS: ORAL_TABLET | ORAL | 3 refills | Status: DC

## 2021-09-08 MED ORDER — TAMSULOSIN HCL 0.4 MG PO CAPS
ORAL_CAPSULE | ORAL | 3 refills | Status: DC
Start: 2021-09-08 — End: 2022-11-07

## 2021-09-08 MED ORDER — FAMOTIDINE 40 MG PO TABS: ORAL_TABLET | ORAL | 3 refills | Status: DC

## 2021-09-08 MED ORDER — ATORVASTATIN CALCIUM 40 MG PO TABS: ORAL_TABLET | ORAL | 2 refills | Status: DC

## 2021-10-12 NOTE — Progress Notes (Signed)
3 MONTH FOLLOW UP   Assessment:    Essential hypertension Started on Losartan 50 mg 1 month ago, BP running 110-120/50's.  Will decrease Losartan to 1/2 tab daily Monitor blood pressure at home; call if consistently over 130/80 Continue DASH diet.   Reminder to go to the ER if any CP, SOB, nausea, dizziness, severe HA, changes vision/speech, left arm numbness and tingling and jaw pain.  Type 2 diabetes mellitus with stage 2 chronic kidney disease, without long-term current use of insulin ([place]) Discussed general issues about diabetes pathophysiology and management., Educational material distributed., Suggested low cholesterol diet., Encouraged aerobic exercise., Discussed foot care., Reminded to get yearly retinal exam, reports requested Continue metformin - A1C  CKD stage 2 due to type 2 diabetes mellitus ([place]) Borderline stage 2/3 Not on ACE/ARB due to low BPs; has been stable Increase fluids, avoid NSAIDS, monitor sugars, will monitor  GERD Doing well at this time Continue: Pantropazole 1 tab daily and famotidine 40 mg at night Diet discussed Monitor for triggers Avoid food with high acid content Avoid excessive cafeine Increase water intake  Erectile dysfunction asociated with T2DM (H[facility]) Continue PRN medication Control blood pressure, cholesterol, glucose, increase exercise.   Mixed hyperlipidemia associated with T2DM ([place]) discussed LDL goal <70 with diabetes Consider reducing atorvastatin if remains aggressively controlled Continue low cholesterol diet and exercise.   Overweight (BMI 27.0-27.9) Long discussion about weight loss, diet, and exercise Recommended diet heavy in fruits and veggies and low in animal meats, cheeses, and dairy products, appropriate calorie intake Discussed appropriate weight for height  Follow up at next visit  Vitamin D deficiency Essentially at recent check;  continue to recommend supplementation for goal of 70-100 Defer vitamin D  level  Medication Management Continued  Over 30 minutes of face to face exam, counseling, chart review and critical decision making was performed.   Future Appointments  Date Time Provider [place]  01/11/2022 11:00 AM Unk Pinto, MD [facility] None  04/08/2022 11:00 AM Liane Comber, NP [facility] None  04/19/2022  3:00 PM Newton Pigg, [facility] None     Subjective:  Edward [name] is a 81 y.o. male who presents for and 3 month follow up - T2DM with CKD, htn/hld, weight, vitamin D def, GERD.   Reports he has not health or medication concerns today.  He has a diagnosis of GERD which is currently well controlled by Protonix 40 mg once a day in AM and Famotidine 40 mg at night  BMI is Body mass index is 27.09 kg/m., he has been working on diet and exercise, works taking care of 6 yards, also lifts weights at home.  Wt Readings from Last 3 Encounters:  10/14/21 180 lb 12.8 oz (82 kg)  08/31/21 179 lb 6.4 oz (81.4 kg)  08/17/21 179 lb 6.4 oz (81.4 kg)   Former remote smoker, quit in 1969.   His blood pressure has been controlled at home running 120's/50's, today their BP is BP: (!) 124/58   BP Readings from Last 3 Encounters:  10/14/21 (!) 124/58  08/31/21 (!) 148/82  08/17/21 (!) 158/82    He does workout. He denies chest pain, shortness of breath, dizziness.   He is on cholesterol medication (lipitor 40 mg daily) and denies myalgias. His cholesterol is at goal. The cholesterol last visit was:   Lab Results  Component Value Date   CHOL 117 07/09/2021   HDL 49 07/09/2021   LDLCALC 55 07/09/2021   TRIG 58 07/09/2021  CHOLHDL 2.4 07/09/2021    He has been working on diet and exercise for T2 diabetes with CKD II/III and ED (sildenafil), well controlled by metformin 500 mg TID, and denies increased appetite, nausea, paresthesia of the feet, polydipsia, polyuria, visual disturbances, vomiting and weight loss.  He does check fasting glucose, ranges  80-110  His BS this AM was 49 did have some hand shaking associated ate an oatmeal cookie and piece of candy and feels much better. Last A1C in the office was:  Lab Results  Component Value Date   HGBA1C 6.3 (H) 07/09/2021   He has CKD borderline II/III associated with T2DM monitored at this office, not on ace/arb, due to low BPs:  Lab Results  Component Value Date   GFRNONAA 52 (L) 01/07/2021   Patient is on Vitamin D supplement and mildly above goal at last check, he is unsure how much taking, "2 caps":    Lab Results  Component Value Date   VD25OH 95 07/09/2021      Medication Review: Current Outpatient Medications on File Prior to Visit  Medication Sig Dispense Refill   APPLE CIDER VINEGAR PO Take 1-2 tablets by mouth daily.     aspirin 81 MG tablet Take 81 mg by mouth 3 (three) times a week.     atorvastatin (LIPITOR) 40 MG tablet TAKE 1 TABLET BY MOUTH ONCE DAILY FOR CHOLESTEROL 90 tablet 2   Cholecalciferol (VITAMIN D PO) Take 5,000 Units by mouth daily.     CINNAMON PO Take 1-2 tablets by mouth daily.     Cyanocobalamin (VITAMIN B 12 PO) Take 1 tablet by mouth daily.     famotidine (PEPCID) 40 MG tablet Take  1 tablet  at Bedtime  to Prevent Heartburn & Indigestion / Patient knows to take by mouth 90 tablet 3   fluticasone (FLONASE) 50 MCG/ACT nasal spray 1-2 Sprays in each nostril once daily for allergies and runny nose. 16 g 1   losartan (COZAAR) 50 MG tablet Take 1 tab daily for blood pressure goal <130/80. 90 tablet 3   metFORMIN (GLUCOPHAGE) 500 MG tablet Take  1 tablet  3 x /day  with Meals  for Diabetes 270 tablet 3   OMEGA-3 FATTY ACIDS PO Take 700 mg by mouth daily.     pantoprazole (PROTONIX) 40 MG tablet Take  1 tablet  Daily  to Prevent Heartburn & Indigestion 90 tablet 3   tamsulosin (FLOMAX) 0.4 MG CAPS capsule Take  1 capsule  Daily  for Prostate 90 capsule 3   zinc gluconate 50 MG tablet Take 50 mg by mouth daily.     Blood Glucose Monitoring Suppl DEVI Use  device to test blood sugar once daily 1 each 0   glucose blood (ONETOUCH VERIO) test strip USE TO TEST BLOOD SUGAR ONCE DAILY 100 strip 12   Lancets MISC Test blood sugar once daily 100 each 11   promethazine-dextromethorphan (PROMETHAZINE-DM) 6.25-15 MG/5ML syrup Take 5 mLs by mouth 4 (four) times daily as needed for cough. (Patient not taking: Reported on 10/14/2021) 240 mL 1   No current facility-administered medications on file prior to visit.    Allergies  Allergen Reactions   Bystolic [Nebivolol Hcl]     Current Problems (verified) Patient Active Problem List   Diagnosis Date Noted   Erectile dysfunction due to type 2 diabetes mellitus ([place]) 04/09/2021   Former smoker 09/02/2018   FHx: heart disease 09/02/2018   CKD stage 3 due to type 2 diabetes  mellitus ([place]) 11/02/2017   Type 2 diabetes mellitus with stage 3a chronic kidney disease, without long-term current use of insulin ([place]) 11/01/2014   Overweight (BMI 25.0-29.9) 07/25/2014   Hyperlipidemia associated with type 2 diabetes mellitus ([place])    Gastroesophageal reflux disease    Essential hypertension    Vitamin D deficiency    BPH with obstruction/lower urinary tract symptoms    SURGICAL HISTORY He  has a past surgical history that includes Cataract extraction w/ intraocular lens implant (2013); Kidney stone surgery; Extracorporeal shock wave lithotripsy (Left, 12/11/2017); Colonoscopy (2013); Polypectomy; and Cataract extraction (Right, 2015). FAMILY HISTORY His family history includes Diabetes in his sister and sister; Heart disease in his mother; Hypertension in his sister and sister. SOCIAL HISTORY He  reports that he quit smoking about 54 years ago. His smoking use included cigarettes. He has a 5.00 pack-year smoking history. He has never used smokeless tobacco. He reports that he does not drink alcohol and does not use drugs.   Review of Systems  Constitutional:  Negative for malaise/fatigue and weight loss.  HENT:   Negative for congestion, ear discharge, ear pain, hearing loss, sore throat and tinnitus.   Eyes:  Negative for blurred vision and double vision.  Respiratory:  Negative for cough, sputum production, shortness of breath and wheezing.   Cardiovascular:  Negative for chest pain, palpitations, orthopnea, claudication, leg swelling and PND.  Gastrointestinal:  Negative for abdominal pain, blood in stool, constipation, diarrhea, heartburn, melena, nausea and vomiting.  Genitourinary: Negative.   Musculoskeletal:  Negative for falls, joint pain and myalgias.  Skin:  Negative for rash.  Neurological:  Negative for dizziness, tingling, sensory change, weakness and headaches.  Endo/Heme/Allergies:  Negative for polydipsia.  Psychiatric/Behavioral: Negative.  Negative for depression, memory loss, substance abuse and suicidal ideas. The patient is not nervous/anxious and does not have insomnia.   All other systems reviewed and are negative.   Objective:     Today's Vitals   10/14/21 1108  BP: (!) 124/58  Pulse: 70  Temp: 97.7 F (36.5 C)  SpO2: 97%  Weight: 180 lb 12.8 oz (82 kg)   Body mass index is 27.09 kg/m.  General appearance: alert, no distress, WD/WN, male HEENT: normocephalic, sclerae anicteric, bilateral external auditory canals clear, pharynx normal Oral cavity: MMM, no lesions Neck: supple, no lymphadenopathy, no thyromegaly, no masses Heart: RRR, normal S1, S2, no murmurs Lungs: CTA bilaterally, no wheezes, rhonchi, or rales Abdomen: +bs, soft, non tender, non distended, no masses, no hepatomegaly, no splenomegaly Musculoskeletal: nontender, no swelling, no obvious deformity Extremities: no edema, no cyanosis, no clubbing Pulses: 2+ symmetric, upper and lower extremities, normal cap refill Neurological: alert, oriented x 3, CN2-12 intact, strength normal upper extremities and lower extremities, sensation normal throughout, DTRs 2+ throughout, no cerebellar signs, gait  normal Psychiatric: normal affect, behavior normal, pleasant     Magda Bernheim, NP   10/14/2021

## 2021-10-14 ENCOUNTER — Other Ambulatory Visit: Payer: Self-pay

## 2021-10-14 ENCOUNTER — Ambulatory Visit (INDEPENDENT_AMBULATORY_CARE_PROVIDER_SITE_OTHER): Payer: HMO | Admitting: Nurse Practitioner

## 2021-10-14 ENCOUNTER — Encounter: Payer: Self-pay | Admitting: Nurse Practitioner

## 2021-10-14 VITALS — BP 124/58 | HR 70 | Temp 97.7°F | Wt 180.8 lb

## 2021-10-14 DIAGNOSIS — N521 Erectile dysfunction due to diseases classified elsewhere: Secondary | ICD-10-CM

## 2021-10-14 DIAGNOSIS — Z0000 Encounter for general adult medical examination without abnormal findings: Secondary | ICD-10-CM

## 2021-10-14 DIAGNOSIS — K219 Gastro-esophageal reflux disease without esophagitis: Secondary | ICD-10-CM

## 2021-10-14 DIAGNOSIS — I10 Essential (primary) hypertension: Secondary | ICD-10-CM | POA: Diagnosis not present

## 2021-10-14 DIAGNOSIS — N183 Chronic kidney disease, stage 3 unspecified: Secondary | ICD-10-CM

## 2021-10-14 DIAGNOSIS — N138 Other obstructive and reflux uropathy: Secondary | ICD-10-CM

## 2021-10-14 DIAGNOSIS — N1831 Chronic kidney disease, stage 3a: Secondary | ICD-10-CM | POA: Diagnosis not present

## 2021-10-14 DIAGNOSIS — E1169 Type 2 diabetes mellitus with other specified complication: Secondary | ICD-10-CM

## 2021-10-14 DIAGNOSIS — E663 Overweight: Secondary | ICD-10-CM | POA: Diagnosis not present

## 2021-10-14 DIAGNOSIS — E559 Vitamin D deficiency, unspecified: Secondary | ICD-10-CM

## 2021-10-14 DIAGNOSIS — Z6827 Body mass index (BMI) 27.0-27.9, adult: Secondary | ICD-10-CM

## 2021-10-14 DIAGNOSIS — E785 Hyperlipidemia, unspecified: Secondary | ICD-10-CM | POA: Diagnosis not present

## 2021-10-14 DIAGNOSIS — E1122 Type 2 diabetes mellitus with diabetic chronic kidney disease: Secondary | ICD-10-CM | POA: Diagnosis not present

## 2021-10-14 DIAGNOSIS — Z79899 Other long term (current) drug therapy: Secondary | ICD-10-CM | POA: Diagnosis not present

## 2021-10-14 DIAGNOSIS — Z86010 Personal history of colonic polyps: Secondary | ICD-10-CM

## 2021-10-14 NOTE — Patient Instructions (Signed)
Decrease Losartan 50 mg to 1/2 tab daily Continue to check Blood pressure if begins running greater than 130/80 please notify the office  Managing Your Hypertension Hypertension, also called high blood pressure, is when the force of the blood pressing against the walls of the arteries is too strong. Arteries are blood vessels that carry blood from your heart throughout your body. Hypertension forces the heart to work harder to pump blood and may cause the arteries to become narrow or stiff. Understanding blood pressure readings Your personal target blood pressure may vary depending on your medical conditions, your age, and other factors. A blood pressure reading includes a higher number over a lower number. Ideally, your blood pressure should be below 120/80. You should know that: The first, or top, number is called the systolic pressure. It is a measure of the pressure in your arteries as your heart beats. The second, or bottom number, is called the diastolic pressure. It is a measure of the pressure in your arteries as the heart relaxes. Blood pressure is classified into four stages. Based on your blood pressure reading, your health care provider may use the following stages to determine what type of treatment you need, if any. Systolic pressure and diastolic pressure are measured in a unit called mmHg. Normal Systolic pressure: below 403. Diastolic pressure: below 80. Elevated Systolic pressure: 474-259. Diastolic pressure: below 80. Hypertension stage 1 Systolic pressure: 563-875. Diastolic pressure: 64-33. Hypertension stage 2 Systolic pressure: 295 or above. Diastolic pressure: 90 or above. How can this condition affect me? Managing your hypertension is an important responsibility. Over time, hypertension can damage the arteries and decrease blood flow to important parts of the body, including the brain, heart, and kidneys. Having untreated or uncontrolled hypertension can lead to: A  heart attack. A stroke. A weakened blood vessel (aneurysm). Heart failure. Kidney damage. Eye damage. Metabolic syndrome. Memory and concentration problems. Vascular dementia. What actions can I take to manage this condition? Hypertension can be managed by making lifestyle changes and possibly by taking medicines. Your health care provider will help you make a plan to bring your blood pressure within a normal range. Nutrition  Eat a diet that is high in fiber and potassium, and low in salt (sodium), added sugar, and fat. An example eating plan is called the Dietary Approaches to Stop Hypertension (DASH) diet. To eat this way: Eat plenty of fresh fruits and vegetables. Try to fill one-half of your plate at each meal with fruits and vegetables. Eat whole grains, such as whole-wheat pasta, brown rice, or whole-grain bread. Fill about one-fourth of your plate with whole grains. Eat low-fat dairy products. Avoid fatty cuts of meat, processed or cured meats, and poultry with skin. Fill about one-fourth of your plate with lean proteins such as fish, chicken without skin, beans, eggs, and tofu. Avoid pre-made and processed foods. These tend to be higher in sodium, added sugar, and fat. Reduce your daily sodium intake. Most people with hypertension should eat less than 1,500 mg of sodium a day. Lifestyle  Work with your health care provider to maintain a healthy body weight or to lose weight. Ask what an ideal weight is for you. Get at least 30 minutes of exercise that causes your heart to beat faster (aerobic exercise) most days of the week. Activities may include walking, swimming, or biking. Include exercise to strengthen your muscles (resistance exercise), such as weight lifting, as part of your weekly exercise routine. Try to do these types of  exercises for 30 minutes at least 3 days a week. Do not use any products that contain nicotine or tobacco, such as cigarettes, e-cigarettes, and chewing  tobacco. If you need help quitting, ask your health care provider. Control any long-term (chronic) conditions you have, such as high cholesterol or diabetes. Identify your sources of stress and find ways to manage stress. This may include meditation, deep breathing, or making time for fun activities. Alcohol use Do not drink alcohol if: Your health care provider tells you not to drink. You are pregnant, may be pregnant, or are planning to become pregnant. If you drink alcohol: Limit how much you use to: 0-1 drink a day for women. 0-2 drinks a day for men. Be aware of how much alcohol is in your drink. In the [place] one drink equals one 12 oz bottle of beer (355 mL), one 5 oz glass of wine (148 mL), or one 1 oz glass of hard liquor (44 mL). Medicines Your health care provider may prescribe medicine if lifestyle changes are not enough to get your blood pressure under control and if: Your systolic blood pressure is 130 or higher. Your diastolic blood pressure is 80 or higher. Take medicines only as told by your health care provider. Follow the directions carefully. Blood pressure medicines must be taken as told by your health care provider. The medicine does not work as well when you skip doses. Skipping doses also puts you at risk for problems. Monitoring Before you monitor your blood pressure: Do not smoke, drink caffeinated beverages, or exercise within 30 minutes before taking a measurement. Use the bathroom and empty your bladder (urinate). Sit quietly for at least 5 minutes before taking measurements. Monitor your blood pressure at home as told by your health care provider. To do this: Sit with your back straight and supported. Place your feet flat on the floor. Do not cross your legs. Support your arm on a flat surface, such as a table. Make sure your upper arm is at heart level. Each time you measure, take two or three readings one minute apart and record the results. You may also  need to have your blood pressure checked regularly by your health care provider. General information Talk with your health care provider about your diet, exercise habits, and other lifestyle factors that may be contributing to hypertension. Review all the medicines you take with your health care provider because there may be side effects or interactions. Keep all visits as told by your health care provider. Your health care provider can help you create and adjust your plan for managing your high blood pressure. Where to find more information National Heart, Lung, and Blood Institute: https://wilson-eaton.com/ American Heart Association: www.heart.org Contact a health care provider if: You think you are having a reaction to medicines you have taken. You have repeated (recurrent) headaches. You feel dizzy. You have swelling in your ankles. You have trouble with your vision. Get help right away if: You develop a severe headache or confusion. You have unusual weakness or numbness, or you feel faint. You have severe pain in your chest or abdomen. You vomit repeatedly. You have trouble breathing. These symptoms may represent a serious problem that is an emergency. Do not wait to see if the symptoms will go away. Get medical help right away. Call your local emergency services (911 in the [place] Do not drive yourself to the hospital. Summary Hypertension is when the force of blood pumping through your arteries is too strong.  If this condition is not controlled, it may put you at risk for serious complications. Your personal target blood pressure may vary depending on your medical conditions, your age, and other factors. For most people, a normal blood pressure is less than 120/80. Hypertension is managed by lifestyle changes, medicines, or both. Lifestyle changes to help manage hypertension include losing weight, eating a healthy, low-sodium diet, exercising more, stopping smoking, and limiting  alcohol. This information is not intended to replace advice given to you by your health care provider. Make sure you discuss any questions you have with your health care provider. Document Revised: 09/16/2019 Document Reviewed: 07/30/2019 Elsevier Patient Education  2022 [employer].

## 2021-10-15 LAB — CBC WITH DIFFERENTIAL/PLATELET
Absolute Monocytes: 451 cells/uL (ref 200–950)
Basophils Absolute: 19 cells/uL (ref 0–200)
Basophils Relative: 0.4 %
Eosinophils Absolute: 82 cells/uL (ref 15–500)
Eosinophils Relative: 1.7 %
HCT: 41.1 % (ref 38.5–50.0)
Hemoglobin: 13.7 g/dL (ref 13.2–17.1)
Lymphs Abs: 1229 cells/uL (ref 850–3900)
MCH: 29.8 pg (ref 27.0–33.0)
MCHC: 33.3 g/dL (ref 32.0–36.0)
MCV: 89.3 fL (ref 80.0–100.0)
MPV: 11.9 fL (ref 7.5–12.5)
Monocytes Relative: 9.4 %
Neutro Abs: 3019 cells/uL (ref 1500–7800)
Neutrophils Relative %: 62.9 %
Platelets: 143 10*3/uL (ref 140–400)
RBC: 4.6 10*6/uL (ref 4.20–5.80)
RDW: 13 % (ref 11.0–15.0)
Total Lymphocyte: 25.6 %
WBC: 4.8 10*3/uL (ref 3.8–10.8)

## 2021-10-15 LAB — HEMOGLOBIN A1C
Hgb A1c MFr Bld: 6.4 % of total Hgb — ABNORMAL HIGH (ref <5.7)
Mean Plasma Glucose: 137 mg/dL
eAG (mmol/L): 7.6 mmol/L

## 2021-10-15 LAB — COMPLETE METABOLIC PANEL WITH GFR
AG Ratio: 1.9 (calc) (ref 1.0–2.5)
ALT: 6 U/L — ABNORMAL LOW (ref 9–46)
AST: 8 U/L — ABNORMAL LOW (ref 10–35)
Albumin: 4 g/dL (ref 3.6–5.1)
Alkaline phosphatase (APISO): 63 U/L (ref 35–144)
BUN: 15 mg/dL (ref 7–25)
CO2: 29 mmol/L (ref 20–32)
Calcium: 9.1 mg/dL (ref 8.6–10.3)
Chloride: 107 mmol/L (ref 98–110)
Creat: 1.18 mg/dL (ref 0.70–1.28)
Globulin: 2.1 g/dL (calc) (ref 1.9–3.7)
Glucose, Bld: 99 mg/dL (ref 65–99)
Potassium: 4.2 mmol/L (ref 3.5–5.3)
Sodium: 143 mmol/L (ref 135–146)
Total Bilirubin: 0.7 mg/dL (ref 0.2–1.2)
Total Protein: 6.1 g/dL (ref 6.1–8.1)
eGFR: 63 mL/min/{1.73_m2} (ref >=60)

## 2021-10-15 LAB — LIPID PANEL
Cholesterol: 171 mg/dL (ref <200)
HDL: 51 mg/dL (ref >=40)
LDL Cholesterol (Calc): 106 mg/dL (calc) — ABNORMAL HIGH
Non-HDL Cholesterol (Calc): 120 mg/dL (calc) (ref <130)
Total CHOL/HDL Ratio: 3.4 (calc) (ref <5.0)
Triglycerides: 56 mg/dL (ref <150)

## 2021-10-29 ENCOUNTER — Telehealth: Payer: Self-pay

## 2021-10-29 NOTE — Telephone Encounter (Signed)
10/29/21-Called patient to r/s initial visit w/ cpp. Patient politely declined at this time to schedule. Encouraged patient to reach out if he would like to r/s. Patient verbalized in agreement and understanding.  Total time spent: [place], [facility]

## 2021-12-28 ENCOUNTER — Telehealth: Payer: Self-pay

## 2021-12-28 NOTE — Telephone Encounter (Signed)
LM-12/28/21-Called pt. at [phone] to complete HTN review call. Pt. stated he has been checking his BP with good readings but does not have a written log. Encouraged pt. to start checking daily and record readings and I will call pt. to assess BP readings in 1-2 weeks. Pt. verbalized understanding and agreed. ? ?Total time spent: 12 min. ?

## 2022-01-04 NOTE — Telephone Encounter (Signed)
LM-01/04/22-Called pt. To assess BP readings and complete HTN review call. Unable to reach pt.  [place]. ? ?Total time spent: 2 min. ?

## 2022-01-10 ENCOUNTER — Encounter: Payer: Self-pay | Admitting: Internal Medicine

## 2022-01-10 NOTE — Progress Notes (Signed)
? ?Annual  Screening/Preventative Visit  ?& Comprehensive Evaluation & Examination ? ?Future Appointments  ?Date Time Provider Department  ?01/11/2022 11:00 AM Unk Pinto, MD [facility]  ?04/08/2022 11:00 AM Kenney Houseman [place] , DNP [facility]  ?04/19/2022  3:00 PM Carleene Mains, [place] [facility]  ?01/16/2023 11:00 AM Unk Pinto, MD [facility]  ? ?    ?     This very nice 81 y.o. MWM presents for a Screening /Preventative Visit & comprehensive evaluation and management of multiple medical co-morbidities.  Patient has been followed for HTN, HLD, T2_NIDDM  and Vitamin D Deficiency. Patient has GERD controlled with diet & his meds.  ? ? ?    HTN predates since 2010. Patient's BP has been controlled and today's BP  is at goal - 118/62. Patient denies any cardiac symptoms as chest pain, palpitations, shortness of breath, dizziness or ankle swelling. ? ? ?    Patient's hyperlipidemia is controlled with diet and medications. Patient denies myalgias or other medication SE's. Last lipids were not at goal : ? ?Lab Results  ?Component Value Date  ? CHOL 171 10/14/2021  ? HDL 51 10/14/2021  ? LDLCALC 106 (H) 10/14/2021  ? TRIG 56 10/14/2021  ? CHOLHDL 3.4 10/14/2021  ? ? ? ?    Patient was initially PreDiabetes (A1c 6.1% /2011), then T2_NIDDM (A1c 6.5% /2015 & 6.7% /2017) w/CKD3a (GFR 52).   Patient takes Metformin, Cinnamon & Vinegar and denies reactive hypoglycemic symptoms, visual blurring, diabetic polys or paresthesias. Last A1c was not at goal : ?  ?Lab Results  ?Component Value Date  ? HGBA1C 6.4 (H) 10/14/2021  ?  ? ? ?    Finally, patient has history of Vitamin D Deficiency ("29" /2008) and last vitamin D was at goal : ?  ?Lab Results  ?Component Value Date  ? VD25OH 95 07/09/2021  ? ? ? ?Current Outpatient Medications on File Prior to Visit  ?Medication Sig  ? APPLE CIDER VINEGAR Take 1-2 tablets daily.  ? aspirin 81 MG  Take  3 times a week.  ? atorvastatin 40 MG  TAKE 1 TABLET DAILY   ? VITAMIN D 5,000 Units  Take  daily.  ? CINNAMON PO Take 1-2 tablets  daily.  ? VITAMIN B 12  Take 1 tablet daily.  ? famotidine 40 MG  Take  1 tablet  at Bedtime  ? FLONASE nasal spray 1-2 Sprays in each nostril daily  ? losartan 50 MG  Take 1 tab daily  ? metFORMIN  500 MG Take  1 tablet  3 x /day   ? OMEGA-3  cap 700 mg Take   daily.  ? pantoprazole  40 MG  Take  1 tablet  Daily  to Prevent Heartburn & Indigestion  ? tamsulosin 0.4 MG  Take  1 capsule  Daily  for Prostate  ? zinc 50 MG  Take 50 mg by mouth daily.  ? ? ?Allergies  ?Allergen Reactions  ? Bystolic [Nebivolol Hcl]   ? ? ?Past Medical History:  ?Diagnosis Date  ? BPH (benign prostatic hyperplasia)   ? Cataract   ? surgical removal bilateral  ? Diabetes mellitus without complication ([place])   ? ED (erectile dysfunction)   ? GERD (gastroesophageal reflux disease)   ? History of adenomatous polyp of colon 11/02/2017  ? Tubular adenoma 2013; recommended 5 year follow up  ? History of kidney stones   ? Hyperlipidemia   ? Hypertension   ? IBS (irritable bowel syndrome)   ?  Kidney stones 02/11/2020  ? LBBB (left bundle branch block)   ? Personal history of kidney stones   ? Pre-diabetes   ? Prediabetes   ? Vitamin D deficiency   ? ? ?Health Maintenance  ?Topic Date Due  ? Hepatitis C Screening  Never done  ? Pneumonia Vaccine 74+ Years old (2 - PPSV23 if available, else PCV20) 04/08/2016  ? Zoster Vaccines- Shingrix (2 of 2) 07/23/2020  ? COVID-19 Vaccine (4 - Booster for [place] series) 08/12/2020  ? FOOT EXAM  01/06/2022  ? INFLUENZA VACCINE  04/12/2022  ? HEMOGLOBIN A1C  04/13/2022  ? OPHTHALMOLOGY EXAM  06/18/2022  ? TETANUS/TDAP  04/08/2024  ? HPV VACCINES  Aged Out  ? ? ?Immunization History  ?Administered Date(s) Administered  ? DT 04/08/2014  ? Influenza, High Dose 05/30/2018, 06/20/2019, 06/08/2020, 07/09/2021  ? Influenza 07/02/2013  ? PFIZER-SARS-COV-2 Vacc 10/03/2019, 10/24/2019, 06/17/2020  ? Pneumococcal-13 04/09/2015  ? Pneumococcal-23 10/02/2007  ? Td 10/02/2003  ? Zoster  Recombinat (Shingrix) 05/28/2020  ? Zoster, Live 10/01/2006  ? ? ?Last Colon - 12/24/2019 - Dr  Henrene Pastor - recommended No f/u due to age.  ? ? ?Past Surgical History:  ?Procedure Laterality Date  ? CATARACT EXTRACTION Right 2015  ? CATARACT EXTRACTION W/ INTRAOCULAR LENS IMPLANT  2013  ? left  ? COLONOSCOPY  2013  ? TA  ? EXTRACORPOREAL SHOCK WAVE LITHOTRIPSY Left 12/11/2017  ? Procedure: LEFT EXTRACORPOREAL SHOCK WAVE LITHOTRIPSY (ESWL);  Surgeon: Lucas Mallow, MD;  Location: [facility];  Service: Urology;  Laterality: Left;  ? KIDNEY STONE SURGERY    ? POLYPECTOMY    ? ? ?Family History  ?Problem Relation Age of Onset  ? Diabetes Sister   ? Hypertension Sister   ? Heart disease Mother   ? Diabetes Sister   ? Hypertension Sister   ? Colon cancer Neg Hx   ? Colon polyps Neg Hx   ? Esophageal cancer Neg Hx   ? Rectal cancer Neg Hx   ? Stomach cancer Neg Hx   ? ? ?Social History  ? ?Tobacco Use  ? Smoking status: Former  ?  Packs/day: 0.50  ?  Years: 10.00  ?  Pack years: 5.00  ?  Types: Cigarettes  ?  Quit date: 09/13/1967  ?  Years since quitting: 54.3  ? Smokeless tobacco: Never  ?Vaping Use  ? Vaping Use: Never used  ?Substance Use Topics  ? Alcohol use: No  ? Drug use: No  ? ? ? ROS ?Constitutional: Denies fever, chills, weight loss/gain, headaches, insomnia,  night sweats or change in appetite. Does c/o fatigue. ?Eyes: Denies redness, blurred vision, diplopia, discharge, itchy or watery eyes.  ?ENT: Denies discharge, congestion, post nasal drip, epistaxis, sore throat, earache, hearing loss, dental pain, Tinnitus, Vertigo, Sinus pain or snoring.  ?Cardio: Denies chest pain, palpitations, irregular heartbeat, syncope, dyspnea, diaphoresis, orthopnea, PND, claudication or edema ?Respiratory: denies cough, dyspnea, DOE, pleurisy, hoarseness, laryngitis or wheezing.  ?Gastrointestinal: Denies dysphagia, heartburn, reflux, water brash, pain, cramps, nausea, vomiting, bloating, diarrhea, constipation, hematemesis, melena,  hematochezia, jaundice or hemorrhoids ?Genitourinary: Denies dysuria, frequency,  discharge, hematuria or flank pain. Has urgency, nocturia x 2-3 & occasional hesitancy. ?Musculoskeletal: Denies arthralgia, myalgia, stiffness, Jt. Swelling, pain, limp or strain/sprain. Denies Falls. ?Skin: Denies puritis, rash, hives, warts, acne, eczema or change in skin lesion ?Neuro: No weakness, tremor, incoordination, spasms, paresthesia or pain ?Psychiatric: Denies confusion, memory loss or sensory loss. Denies Depression. ?Endocrine: Denies change in weight, skin,  hair change, nocturia, and paresthesia, diabetic polys, visual blurring or hyper / hypo glycemic episodes.  ?Heme/Lymph: No excessive bleeding, bruising or enlarged lymph nodes. ? ? ?Physical Exam ? ?BP 118/62   Pulse 62   Temp 97.9 ?F (36.6 ?C)   Resp 16   Ht 5' 8.5" (1.74 m)   Wt 175 lb 12.8 oz (79.7 kg)   SpO2 95%   BMI 26.34 kg/m?  ? ?General Appearance: Well nourished and well groomed and in no apparent distress. ? ?Eyes: PERRLA, EOMs, conjunctiva no swelling or erythema, normal fundi and vessels. ?Sinuses: No frontal/maxillary tenderness ?ENT/Mouth: EACs patent / TMs  nl. Nares clear without erythema, swelling, mucoid exudates. Oral hygiene is good. No erythema, swelling, or exudate. Tongue normal, non-obstructing. Tonsils not swollen or erythematous. Hearing normal.  ?Neck: Supple, thyroid not palpable. No bruits, nodes or JVD. ?Respiratory: Respiratory effort normal.  BS equal and clear bilateral without rales, rhonci, wheezing or stridor. ?Cardio: Heart sounds are normal with regular rate and rhythm and no murmurs, rubs or gallops. Peripheral pulses are normal and equal bilaterally without edema. No aortic or femoral bruits. ?Chest: symmetric with normal excursions and percussion.  ?Abdomen: Soft, with Nl bowel sounds. Nontender, no guarding, rebound, hernias, masses, or organomegaly.  ?Lymphatics: Non tender without lymphadenopathy.   ?Musculoskeletal: Full ROM all peripheral extremities, joint stability, 5/5 strength, and normal gait. ?Skin: Warm and dry without rashes, lesions, cyanosis, clubbing or  ecchymosis.  ?Neuro: Cranial nerves intact, reflexes eq

## 2022-01-10 NOTE — Patient Instructions (Signed)

## 2022-01-11 ENCOUNTER — Ambulatory Visit (INDEPENDENT_AMBULATORY_CARE_PROVIDER_SITE_OTHER): Payer: HMO | Admitting: Internal Medicine

## 2022-01-11 ENCOUNTER — Encounter: Payer: Self-pay | Admitting: Internal Medicine

## 2022-01-11 VITALS — BP 118/62 | HR 62 | Temp 97.9°F | Resp 16 | Ht 68.5 in | Wt 175.8 lb

## 2022-01-11 DIAGNOSIS — E1122 Type 2 diabetes mellitus with diabetic chronic kidney disease: Secondary | ICD-10-CM

## 2022-01-11 DIAGNOSIS — Z125 Encounter for screening for malignant neoplasm of prostate: Secondary | ICD-10-CM

## 2022-01-11 DIAGNOSIS — Z87891 Personal history of nicotine dependence: Secondary | ICD-10-CM | POA: Diagnosis not present

## 2022-01-11 DIAGNOSIS — Z136 Encounter for screening for cardiovascular disorders: Secondary | ICD-10-CM | POA: Diagnosis not present

## 2022-01-11 DIAGNOSIS — I10 Essential (primary) hypertension: Secondary | ICD-10-CM | POA: Diagnosis not present

## 2022-01-11 DIAGNOSIS — Z1211 Encounter for screening for malignant neoplasm of colon: Secondary | ICD-10-CM

## 2022-01-11 DIAGNOSIS — Z0000 Encounter for general adult medical examination without abnormal findings: Secondary | ICD-10-CM

## 2022-01-11 DIAGNOSIS — Z79899 Other long term (current) drug therapy: Secondary | ICD-10-CM | POA: Diagnosis not present

## 2022-01-11 DIAGNOSIS — N1831 Chronic kidney disease, stage 3a: Secondary | ICD-10-CM | POA: Diagnosis not present

## 2022-01-11 DIAGNOSIS — Z8249 Family history of ischemic heart disease and other diseases of the circulatory system: Secondary | ICD-10-CM | POA: Diagnosis not present

## 2022-01-11 DIAGNOSIS — E785 Hyperlipidemia, unspecified: Secondary | ICD-10-CM | POA: Diagnosis not present

## 2022-01-11 DIAGNOSIS — E1169 Type 2 diabetes mellitus with other specified complication: Secondary | ICD-10-CM

## 2022-01-11 DIAGNOSIS — E559 Vitamin D deficiency, unspecified: Secondary | ICD-10-CM | POA: Diagnosis not present

## 2022-01-11 DIAGNOSIS — N138 Other obstructive and reflux uropathy: Secondary | ICD-10-CM | POA: Diagnosis not present

## 2022-01-11 DIAGNOSIS — K219 Gastro-esophageal reflux disease without esophagitis: Secondary | ICD-10-CM | POA: Diagnosis not present

## 2022-01-11 DIAGNOSIS — I700 Atherosclerosis of aorta: Secondary | ICD-10-CM | POA: Diagnosis not present

## 2022-01-11 DIAGNOSIS — Z0001 Encounter for general adult medical examination with abnormal findings: Secondary | ICD-10-CM

## 2022-01-11 DIAGNOSIS — N401 Enlarged prostate with lower urinary tract symptoms: Secondary | ICD-10-CM | POA: Diagnosis not present

## 2022-01-12 ENCOUNTER — Telehealth: Payer: Self-pay

## 2022-01-12 LAB — PSA: PSA: 3.47 ng/mL (ref <=4.00)

## 2022-01-12 LAB — COMPLETE METABOLIC PANEL WITH GFR
AG Ratio: 2.3 (calc) (ref 1.0–2.5)
ALT: 11 U/L (ref 9–46)
AST: 9 U/L — ABNORMAL LOW (ref 10–35)
Albumin: 4.1 g/dL (ref 3.6–5.1)
Alkaline phosphatase (APISO): 70 U/L (ref 35–144)
BUN: 16 mg/dL (ref 7–25)
CO2: 29 mmol/L (ref 20–32)
Calcium: 9.5 mg/dL (ref 8.6–10.3)
Chloride: 107 mmol/L (ref 98–110)
Creat: 1.23 mg/dL (ref 0.70–1.28)
Globulin: 1.8 g/dL (calc) — ABNORMAL LOW (ref 1.9–3.7)
Glucose, Bld: 123 mg/dL — ABNORMAL HIGH (ref 65–99)
Potassium: 4.3 mmol/L (ref 3.5–5.3)
Sodium: 143 mmol/L (ref 135–146)
Total Bilirubin: 0.8 mg/dL (ref 0.2–1.2)
Total Protein: 5.9 g/dL — ABNORMAL LOW (ref 6.1–8.1)
eGFR: 60 mL/min/{1.73_m2} (ref >=60)

## 2022-01-12 LAB — CBC WITH DIFFERENTIAL/PLATELET
Absolute Monocytes: 427 cells/uL (ref 200–950)
Basophils Absolute: 22 cells/uL (ref 0–200)
Basophils Relative: 0.4 %
Eosinophils Absolute: 59 cells/uL (ref 15–500)
Eosinophils Relative: 1.1 %
HCT: 43 % (ref 38.5–50.0)
Hemoglobin: 14.1 g/dL (ref 13.2–17.1)
Lymphs Abs: 1237 cells/uL (ref 850–3900)
MCH: 29.6 pg (ref 27.0–33.0)
MCHC: 32.8 g/dL (ref 32.0–36.0)
MCV: 90.1 fL (ref 80.0–100.0)
MPV: 12.5 fL (ref 7.5–12.5)
Monocytes Relative: 7.9 %
Neutro Abs: 3656 cells/uL (ref 1500–7800)
Neutrophils Relative %: 67.7 %
Platelets: 135 10*3/uL — ABNORMAL LOW (ref 140–400)
RBC: 4.77 10*6/uL (ref 4.20–5.80)
RDW: 12.8 % (ref 11.0–15.0)
Total Lymphocyte: 22.9 %
WBC: 5.4 10*3/uL (ref 3.8–10.8)

## 2022-01-12 LAB — LIPID PANEL
Cholesterol: 107 mg/dL (ref <200)
HDL: 53 mg/dL (ref >=40)
LDL Cholesterol (Calc): 42 mg/dL (calc)
Non-HDL Cholesterol (Calc): 54 mg/dL (calc) (ref <130)
Total CHOL/HDL Ratio: 2 (calc) (ref <5.0)
Triglycerides: 50 mg/dL (ref <150)

## 2022-01-12 LAB — MICROALBUMIN / CREATININE URINE RATIO
Creatinine, Urine: 138 mg/dL (ref 20–320)
Microalb Creat Ratio: 14 mcg/mg creat (ref <30)
Microalb, Ur: 1.9 mg/dL

## 2022-01-12 LAB — TSH: TSH: 2.06 mIU/L (ref 0.40–4.50)

## 2022-01-12 LAB — INSULIN, RANDOM: Insulin: 10.9 u[IU]/mL

## 2022-01-12 LAB — PTH, INTACT AND CALCIUM
Calcium: 9.5 mg/dL (ref 8.6–10.3)
PTH: 14 pg/mL — ABNORMAL LOW (ref 16–77)

## 2022-01-12 LAB — URINALYSIS, ROUTINE W REFLEX MICROSCOPIC
Bilirubin Urine: NEGATIVE
Glucose, UA: NEGATIVE
Hgb urine dipstick: NEGATIVE
Ketones, ur: NEGATIVE
Leukocytes,Ua: NEGATIVE
Nitrite: NEGATIVE
Protein, ur: NEGATIVE
Specific Gravity, Urine: 1.023 (ref 1.001–1.035)
pH: 6 (ref 5.0–8.0)

## 2022-01-12 LAB — HEMOGLOBIN A1C
Hgb A1c MFr Bld: 6.4 % of total Hgb — ABNORMAL HIGH (ref <5.7)
Mean Plasma Glucose: 137 mg/dL
eAG (mmol/L): 7.6 mmol/L

## 2022-01-12 LAB — VITAMIN D 25 HYDROXY (VIT D DEFICIENCY, FRACTURES): Vit D, 25-Hydroxy: 95 ng/mL (ref 30–100)

## 2022-01-12 LAB — MAGNESIUM: Magnesium: 1.7 mg/dL (ref 1.5–2.5)

## 2022-01-12 NOTE — Telephone Encounter (Signed)
LM-01/12/22-Called pt.at [phone] to complete HTN review call and complete CCM to [facility] transfer. Unable to reach patient. [place]. ? ? ?Total time spent: 2 min. ?

## 2022-01-14 NOTE — Telephone Encounter (Signed)
LM-01/14/22-Calling pt. To complete program transfer from CCM to [place]. Spoke to pt. And completed CCM to [facility] transfer. Encouraged pt. To reach out if any new health concerns arise. Pt. Verbalized understanding and agreed. ? ?Total time spent: 4 min. ?

## 2022-01-17 ENCOUNTER — Telehealth: Payer: Self-pay

## 2022-01-17 NOTE — Telephone Encounter (Signed)
LM-01/17/22-Called pt. To complete HTN review call. Unable to reach pt. No VM box setup. ? ?Total time spent: 2 min ?

## 2022-04-08 ENCOUNTER — Ambulatory Visit: Payer: Self-pay | Admitting: Nurse Practitioner

## 2022-04-13 ENCOUNTER — Ambulatory Visit: Payer: PPO | Admitting: Nurse Practitioner

## 2022-04-19 ENCOUNTER — Telehealth: Payer: PPO | Admitting: Pharmacy Technician

## 2022-04-25 ENCOUNTER — Ambulatory Visit (INDEPENDENT_AMBULATORY_CARE_PROVIDER_SITE_OTHER): Payer: HMO | Admitting: Nurse Practitioner

## 2022-04-25 ENCOUNTER — Encounter: Payer: Self-pay | Admitting: Nurse Practitioner

## 2022-04-25 VITALS — BP 128/74 | HR 62 | Temp 97.7°F | Ht 68.5 in | Wt 172.8 lb

## 2022-04-25 DIAGNOSIS — R6889 Other general symptoms and signs: Secondary | ICD-10-CM

## 2022-04-25 DIAGNOSIS — Z79899 Other long term (current) drug therapy: Secondary | ICD-10-CM

## 2022-04-25 DIAGNOSIS — E663 Overweight: Secondary | ICD-10-CM

## 2022-04-25 DIAGNOSIS — N1831 Chronic kidney disease, stage 3a: Secondary | ICD-10-CM

## 2022-04-25 DIAGNOSIS — N521 Erectile dysfunction due to diseases classified elsewhere: Secondary | ICD-10-CM

## 2022-04-25 DIAGNOSIS — Z0001 Encounter for general adult medical examination with abnormal findings: Secondary | ICD-10-CM

## 2022-04-25 DIAGNOSIS — K2100 Gastro-esophageal reflux disease with esophagitis, without bleeding: Secondary | ICD-10-CM

## 2022-04-25 DIAGNOSIS — I10 Essential (primary) hypertension: Secondary | ICD-10-CM

## 2022-04-25 DIAGNOSIS — E785 Hyperlipidemia, unspecified: Secondary | ICD-10-CM | POA: Diagnosis not present

## 2022-04-25 DIAGNOSIS — E1169 Type 2 diabetes mellitus with other specified complication: Secondary | ICD-10-CM | POA: Diagnosis not present

## 2022-04-25 DIAGNOSIS — N183 Chronic kidney disease, stage 3 unspecified: Secondary | ICD-10-CM | POA: Diagnosis not present

## 2022-04-25 DIAGNOSIS — E1122 Type 2 diabetes mellitus with diabetic chronic kidney disease: Secondary | ICD-10-CM

## 2022-04-25 DIAGNOSIS — Z0000 Encounter for general adult medical examination without abnormal findings: Secondary | ICD-10-CM

## 2022-04-25 DIAGNOSIS — E559 Vitamin D deficiency, unspecified: Secondary | ICD-10-CM

## 2022-04-25 NOTE — Patient Instructions (Signed)

## 2022-04-25 NOTE — Progress Notes (Signed)
3 MONTH FOLLOW UP   Assessment:    Essential hypertension Controlled Discussed DASH (Dietary Approaches to Stop Hypertension) DASH diet is lower in sodium than a typical American diet. Cut back on foods that are high in saturated fat, cholesterol, and trans fats. Eat more whole-grain foods, fish, poultry, and nuts Remain active and exercise as tolerated daily.  Monitor BP at home-Call if greater than 130/80.  Check CMP/CBC   Type 2 diabetes mellitus with stage 2 chronic kidney disease, without long-term current use of insulin [facility]) Education: Reviewed 'ABCs' of diabetes management  Discussed goals to be met and/or maintained include A1C (<7) Blood pressure (<130/80) Cholesterol (LDL <70) Continue Eye Exam yearly  Continue Dental Exam Q6 mo Discussed dietary recommendations Discussed Physical Activity recommendations Foot exam UTD Check A1C   CKD stage 2 due to type 2 diabetes mellitus (HCC) Continue ACE, bASA, statin Discussed how what you eat and drink can aide in kidney protection. Stay well hydrated. Avoid high salt foods. Avoid NSAIDS. Keep BP and BG well controlled.   Take medications as prescribed. Remain active and exercise as tolerated daily. Maintain weight.  Continue to monitor. Check CMP/GFR/Microablumin   GERD Continue Pepcid PRN. No suspected reflux complications ([place]/stricture). Lifestyle modification:  wt loss, avoid meals 2-3h before bedtime. Consider eliminating food triggers:  chocolate, caffeine, EtOH, acid/spicy food.   Mixed hyperlipidemia associated with T2DM (HCC) Continue Atorvastatin. Discussed lifestyle modifications. Recommended diet heavy in fruits and veggies, omega 3's. Decrease consumption of animal meats, cheeses, and dairy products. Remain active and exercise as tolerated. Continue to monitor. Check lipids/TSH   Overweight (BMI 27.0-27.9) Discussed appropriate BMI Goal of losing 1 lb per month. Diet  modification. Physical activity. Encouraged/praised to build confidence.   Vitamin D deficiency Essentially at recent check;  continue to recommend supplementation for goal of 70-100 Defer vitamin D level  Medication Management All medications discussed and reviewed in full. All questions and concerns regarding medications addressed.    Orders Placed This Encounter  Procedures   CBC with Differential/Platelet   COMPLETE METABOLIC PANEL WITH GFR   Lipid panel   Hemoglobin A1c   VITAMIN D 25 Hydroxy (Vit-D Deficiency, Fractures)      Over 30 minutes of face to face exam, counseling, chart review and critical decision making was performed.   Future Appointments  Date Time Provider [place]  07/28/2022  2:30 PM Unk Pinto, MD [facility] None  01/16/2023 11:00 AM Unk Pinto, MD [facility] None     Subjective:  Edward [name] is a 81 y.o. male who presents for and 3 month follow up - T2DM with CKD, htn/hld, weight, vitamin D def, GERD.   Overall he reports feeling well.  He has not additional concerns or changes to medical needs today.  He does report having Covid in 02/2022.  Has a lingering "clearing of throat" issues.  Takes antihistamine PRN with some relief.    He has a diagnosis of GERD which is currently well controlled by Protonix 40 mg once a day in AM and Famotidine 40 mg at night.  Now taking PRN.  Tries to avoid triggers.  BMI is Body mass index is 25.89 kg/m., he has been working on diet and exercise, works taking care of 6 yards, also lifts weights at home up to 130  lbs.  Plays volleyball weekly with church group. Wt Readings from Last 3 Encounters:  04/25/22 172 lb 12.8 oz (78.4 kg)  01/11/22 175 lb 12.8 oz (79.7 kg)  10/14/21 180 lb 12.8 oz (82 kg)   Former remote smoker, quit in 1969.   His blood pressure has been controlled at home running 120's/50's, today their BP is BP: 128/74   BP Readings from Last 3 Encounters:  04/25/22  128/74  01/11/22 118/62  10/14/21 (!) 124/58    He does workout. He denies chest pain, shortness of breath, dizziness.   He is on cholesterol medication (lipitor 40 mg daily) and denies myalgias. His cholesterol is at goal. The cholesterol last visit was:   Lab Results  Component Value Date   CHOL 107 01/11/2022   HDL 53 01/11/2022   LDLCALC 42 01/11/2022   TRIG 50 01/11/2022   CHOLHDL 2.0 01/11/2022    He has been working on diet and exercise for T2 diabetes with CKD II/III and ED (sildenafil), well controlled by metformin 500 mg TID, and denies increased appetite, nausea, paresthesia of the feet, polydipsia, polyuria, visual disturbances, vomiting and weight loss.  He does check fasting glucose, ranges 80-110  His BS this AM was 49 did have some hand shaking associated ate an oatmeal cookie and piece of candy and feels much better. Last A1C in the office was:  Lab Results  Component Value Date   HGBA1C 6.4 (H) 01/11/2022   He has CKD borderline II/III associated with T2DM monitored at this office, not on ace/arb, due to low BPs:  Lab Results  Component Value Date   GFRNONAA 52 (L) 01/07/2021   Patient is on Vitamin D supplement and mildly above goal at last check, he is unsure how much taking, "2 caps":    Lab Results  Component Value Date   VD25OH 95 01/11/2022      Medication Review: Current Outpatient Medications on File Prior to Visit  Medication Sig Dispense Refill   APPLE CIDER VINEGAR PO Take 1-2 tablets by mouth daily.     aspirin 81 MG tablet Take 81 mg by mouth 3 (three) times a week.     atorvastatin (LIPITOR) 40 MG tablet TAKE 1 TABLET BY MOUTH ONCE DAILY FOR CHOLESTEROL 90 tablet 2   Blood Glucose Monitoring Suppl DEVI Use device to test blood sugar once daily 1 each 0   Cholecalciferol (VITAMIN D PO) Take 5,000 Units by mouth daily.     CINNAMON PO Take 1-2 tablets by mouth daily.     Cyanocobalamin (VITAMIN B 12 PO) Take 1 tablet by mouth daily.      famotidine (PEPCID) 40 MG tablet Take  1 tablet  at Bedtime  to Prevent Heartburn & Indigestion / Patient knows to take by mouth 90 tablet 3   fluticasone (FLONASE) 50 MCG/ACT nasal spray 1-2 Sprays in each nostril once daily for allergies and runny nose. 16 g 1   glucose blood (ONETOUCH VERIO) test strip USE TO TEST BLOOD SUGAR ONCE DAILY 100 strip 12   Lancets MISC Test blood sugar once daily 100 each 11   losartan (COZAAR) 50 MG tablet Take 1 tab daily for blood pressure goal <130/80. 90 tablet 3   metFORMIN (GLUCOPHAGE) 500 MG tablet Take  1 tablet  3 x /day  with Meals  for Diabetes 270 tablet 3   OMEGA-3 FATTY ACIDS PO Take 700 mg by mouth daily.     pantoprazole (PROTONIX) 40 MG tablet Take  1 tablet  Daily  to Prevent Heartburn & Indigestion 90 tablet 3   tamsulosin (FLOMAX) 0.4 MG CAPS capsule Take  1 capsule  Daily  for Prostate  90 capsule 3   zinc gluconate 50 MG tablet Take 50 mg by mouth daily.     No current facility-administered medications on file prior to visit.    Allergies  Allergen Reactions   Bystolic [Nebivolol Hcl]     Current Problems (verified) Patient Active Problem List   Diagnosis Date Noted   Erectile dysfunction due to type 2 diabetes mellitus ([place]) 04/09/2021   Former smoker 09/02/2018   FHx: heart disease 09/02/2018   CKD stage 3 due to type 2 diabetes mellitus ([place]) 11/02/2017   Type 2 diabetes mellitus with stage 3a chronic kidney disease, without long-term current use of insulin ([place]) 11/01/2014   Overweight (BMI 25.0-29.9) 07/25/2014   Hyperlipidemia associated with type 2 diabetes mellitus ([place])    Gastroesophageal reflux disease    Essential hypertension    Vitamin D deficiency    BPH with obstruction/lower urinary tract symptoms    SURGICAL HISTORY He  has a past surgical history that includes Cataract extraction w/ intraocular lens implant (2013); Kidney stone surgery; Extracorporeal shock wave lithotripsy (Left, 12/11/2017); Colonoscopy  (2013); Polypectomy; and Cataract extraction (Right, 2015). FAMILY HISTORY His family history includes Diabetes in his sister and sister; Heart disease in his mother; Hypertension in his sister and sister. SOCIAL HISTORY He  reports that he quit smoking about 54 years ago. His smoking use included cigarettes. He has a 5.00 pack-year smoking history. He has never used smokeless tobacco. He reports that he does not drink alcohol and does not use drugs.   Review of Systems  Constitutional:  Negative for malaise/fatigue and weight loss.  HENT:  Negative for congestion, ear discharge, ear pain, hearing loss, sore throat and tinnitus.   Eyes:  Negative for blurred vision and double vision.  Respiratory:  Negative for cough, sputum production, shortness of breath and wheezing.   Cardiovascular:  Negative for chest pain, palpitations, orthopnea, claudication, leg swelling and PND.  Gastrointestinal:  Negative for abdominal pain, blood in stool, constipation, diarrhea, heartburn, melena, nausea and vomiting.  Genitourinary: Negative.   Musculoskeletal:  Negative for falls, joint pain and myalgias.  Skin:  Negative for rash.  Neurological:  Negative for dizziness, tingling, sensory change, weakness and headaches.  Endo/Heme/Allergies:  Negative for polydipsia.  Psychiatric/Behavioral: Negative.  Negative for depression, memory loss, substance abuse and suicidal ideas. The patient is not nervous/anxious and does not have insomnia.   All other systems reviewed and are negative.    Objective:     Today's Vitals   04/25/22 1051  BP: 128/74  Pulse: 62  Temp: 97.7 F (36.5 C)  SpO2: 96%  Weight: 172 lb 12.8 oz (78.4 kg)  Height: 5' 8.5" (1.74 m)   Body mass index is 25.89 kg/m.  General appearance: alert, no distress, WD/WN, male HEENT: normocephalic, sclerae anicteric, bilateral external auditory canals clear, pharynx normal Oral cavity: MMM, no lesions Neck: supple, no lymphadenopathy, no  thyromegaly, no masses Heart: RRR, normal S1, S2, no murmurs Lungs: CTA bilaterally, no wheezes, rhonchi, or rales Abdomen: +bs, soft, non tender, non distended, no masses, no hepatomegaly, no splenomegaly Musculoskeletal: nontender, no swelling, no obvious deformity Extremities: no edema, no cyanosis, no clubbing Pulses: 2+ symmetric, upper and lower extremities, normal cap refill Neurological: alert, oriented x 3, CN2-12 intact, strength normal upper extremities and lower extremities, sensation normal throughout, DTRs 2+ throughout, no cerebellar signs, gait normal Psychiatric: normal affect, behavior normal, pleasant   Screening Tests Immunization History  Administered Date(s) Administered   DT (Pediatric) 04/08/2014  Influenza, High Dose Seasonal PF 06/24/2014, 07/28/2015, 05/02/2016, 07/31/2017, 05/30/2018, 06/20/2019, 06/08/2020, 07/09/2021   Influenza-Unspecified 07/02/2013   PFIZER(Purple Top)SARS-COV-2 Vaccination 10/03/2019, 10/24/2019, 06/17/2020   Pneumococcal Conjugate-13 04/09/2015   Pneumococcal-Unspecified 10/02/2007   Td 10/02/2003   Zoster Recombinat (Shingrix) 05/28/2020   Zoster, Live 10/01/2006   Health Maintenance  Topic Date Due   Pneumonia Vaccine 69+ Years old (2 - PPSV23 or PCV20) 04/08/2016   Zoster Vaccines- Shingrix (2 of 2) 07/23/2020   COVID-19 Vaccine (4 - Pfizer risk series) 08/12/2020   INFLUENZA VACCINE  04/12/2022   OPHTHALMOLOGY EXAM  06/18/2022   HEMOGLOBIN A1C  07/14/2022   FOOT EXAM  01/11/2023   TETANUS/TDAP  04/08/2024   HPV VACCINES  Aged Out    Last colonoscopy: 12/2019 Repeat colonoscopy is not recommended for surveillance.  Names of Other Physician/Practitioners you currently use: 1. [facility] here for primary care 2. 2023, eye doctor, last visit  3. 2023, dentist, last visit  4. PRN, derm, last visit   Patient Care Team: Unk Pinto, MD as PCP - General (Internal Medicine) Luberta Mutter, MD as Consulting Physician (Ophthalmology) Inda Castle, MD (Inactive) as Consulting Physician (Gastroenterology) Lucas Mallow, MD as Consulting Physician (Urology) Rush Landmark, [facility] as Pharmacist (Pharmacist)  Surgical: He  has a past surgical history that includes Cataract extraction w/ intraocular lens implant (2013); Kidney stone surgery; Extracorporeal shock wave lithotripsy (Left, 12/11/2017); Colonoscopy (2013); Polypectomy; and Cataract extraction (Right, 2015). Family His family history includes Diabetes in his sister and sister; Heart disease in his mother; Hypertension in his sister and sister. Social history  He reports that he quit smoking about 54 years ago. His smoking use included cigarettes. He has a 5.00 pack-year smoking history. He has never used smokeless tobacco. He reports that he does not drink alcohol and does not use drugs.  MEDICARE WELLNESS OBJECTIVES: Physical activity: Exercise limited by: cardiac condition(s) Cardiac risk factors: Cardiac Risk Factors include: advanced age (>35mn, >>58women);diabetes mellitus;dyslipidemia;male gender Depression/mood screen:      04/25/2022   11:57 AM  Depression screen PHQ 2/9  Decreased Interest 0  Down, Depressed, Hopeless 0  PHQ - 2 Score 0    ADLs:     04/25/2022   11:57 AM 01/10/2022   11:38 PM  In your present state of health, do you have any difficulty performing the following activities:  Hearing? 0 0  Vision? 0 0  Difficulty concentrating or making decisions? 0 0  Walking or climbing stairs? 0 0  Dressing or bathing? 0 0  Doing errands, shopping? 0 0  Preparing Food and eating ? N   Using the Toilet? N   In the past six months, have you accidently leaked urine? N   Do you have problems with loss of bowel control? N   Managing your Medications? N   Managing your Finances? N   Housekeeping or managing your Housekeeping? N      Cognitive Testing  Alert? Yes  Normal  Appearance?Yes  Oriented to person? Yes  Place? Yes   Time? Yes  Recall of three objects?  Yes  Can perform simple calculations? Yes  Displays appropriate judgment?Yes  Can read the correct time from a watch face?Yes  EOL planning: Does Patient Have a Medical Advance Directive?: Yes Type of Advance Directive: Living will Does patient want to make changes to medical advance directive?: No - Patient declined  EKG: 01/2022   Medicare Attestation I have personally  reviewed: The patient's medical and social history Their use of alcohol, tobacco or illicit drugs Their current medications and supplements The patient's functional ability including ADLs,fall risks, home safety risks, cognitive, and hearing and visual impairment Diet and physical activities Evidence for depression or mood disorders      Radwan Cowley, NP   04/25/2022

## 2022-04-26 LAB — COMPLETE METABOLIC PANEL WITH GFR
AG Ratio: 1.9 (calc) (ref 1.0–2.5)
ALT: 8 U/L — ABNORMAL LOW (ref 9–46)
AST: 6 U/L — ABNORMAL LOW (ref 10–35)
Albumin: 4 g/dL (ref 3.6–5.1)
Alkaline phosphatase (APISO): 63 U/L (ref 35–144)
BUN/Creatinine Ratio: 16 (calc) (ref 6–22)
BUN: 20 mg/dL (ref 7–25)
CO2: 26 mmol/L (ref 20–32)
Calcium: 9.2 mg/dL (ref 8.6–10.3)
Chloride: 109 mmol/L (ref 98–110)
Creat: 1.23 mg/dL — ABNORMAL HIGH (ref 0.70–1.22)
Globulin: 2.1 g/dL (calc) (ref 1.9–3.7)
Glucose, Bld: 103 mg/dL — ABNORMAL HIGH (ref 65–99)
Potassium: 4.4 mmol/L (ref 3.5–5.3)
Sodium: 143 mmol/L (ref 135–146)
Total Bilirubin: 0.6 mg/dL (ref 0.2–1.2)
Total Protein: 6.1 g/dL (ref 6.1–8.1)
eGFR: 59 mL/min/{1.73_m2} — ABNORMAL LOW (ref >=60)

## 2022-04-26 LAB — VITAMIN D 25 HYDROXY (VIT D DEFICIENCY, FRACTURES): Vit D, 25-Hydroxy: 91 ng/mL (ref 30–100)

## 2022-04-26 LAB — CBC WITH DIFFERENTIAL/PLATELET
Absolute Monocytes: 410 cells/uL (ref 200–950)
Basophils Absolute: 18 cells/uL (ref 0–200)
Basophils Relative: 0.4 %
Eosinophils Absolute: 68 cells/uL (ref 15–500)
Eosinophils Relative: 1.5 %
HCT: 41.9 % (ref 38.5–50.0)
Hemoglobin: 13.9 g/dL (ref 13.2–17.1)
Lymphs Abs: 1161 cells/uL (ref 850–3900)
MCH: 29.7 pg (ref 27.0–33.0)
MCHC: 33.2 g/dL (ref 32.0–36.0)
MCV: 89.5 fL (ref 80.0–100.0)
MPV: 11.7 fL (ref 7.5–12.5)
Monocytes Relative: 9.1 %
Neutro Abs: 2844 cells/uL (ref 1500–7800)
Neutrophils Relative %: 63.2 %
Platelets: 138 10*3/uL — ABNORMAL LOW (ref 140–400)
RBC: 4.68 10*6/uL (ref 4.20–5.80)
RDW: 13 % (ref 11.0–15.0)
Total Lymphocyte: 25.8 %
WBC: 4.5 10*3/uL (ref 3.8–10.8)

## 2022-04-26 LAB — LIPID PANEL
Cholesterol: 106 mg/dL (ref <200)
HDL: 50 mg/dL (ref >=40)
LDL Cholesterol (Calc): 43 mg/dL (calc)
Non-HDL Cholesterol (Calc): 56 mg/dL (calc) (ref <130)
Total CHOL/HDL Ratio: 2.1 (calc) (ref <5.0)
Triglycerides: 47 mg/dL (ref <150)

## 2022-04-26 LAB — HEMOGLOBIN A1C
Hgb A1c MFr Bld: 6.4 % of total Hgb — ABNORMAL HIGH (ref <5.7)
Mean Plasma Glucose: 137 mg/dL
eAG (mmol/L): 7.6 mmol/L

## 2022-06-20 DIAGNOSIS — D485 Neoplasm of uncertain behavior of skin: Secondary | ICD-10-CM | POA: Diagnosis not present

## 2022-06-20 DIAGNOSIS — L821 Other seborrheic keratosis: Secondary | ICD-10-CM | POA: Diagnosis not present

## 2022-06-20 DIAGNOSIS — L820 Inflamed seborrheic keratosis: Secondary | ICD-10-CM | POA: Diagnosis not present

## 2022-06-20 DIAGNOSIS — L570 Actinic keratosis: Secondary | ICD-10-CM | POA: Diagnosis not present

## 2022-07-08 LAB — HM DIABETES EYE EXAM

## 2022-07-11 ENCOUNTER — Ambulatory Visit: Payer: PPO | Admitting: Internal Medicine

## 2022-07-28 ENCOUNTER — Ambulatory Visit (INDEPENDENT_AMBULATORY_CARE_PROVIDER_SITE_OTHER): Payer: HMO | Admitting: Internal Medicine

## 2022-07-28 ENCOUNTER — Encounter: Payer: Self-pay | Admitting: Internal Medicine

## 2022-07-28 VITALS — BP 130/60 | HR 51 | Temp 97.9°F | Resp 16 | Ht 68.5 in | Wt 174.6 lb

## 2022-07-28 DIAGNOSIS — Z23 Encounter for immunization: Secondary | ICD-10-CM | POA: Diagnosis not present

## 2022-07-28 DIAGNOSIS — I10 Essential (primary) hypertension: Secondary | ICD-10-CM | POA: Diagnosis not present

## 2022-07-28 DIAGNOSIS — E785 Hyperlipidemia, unspecified: Secondary | ICD-10-CM

## 2022-07-28 DIAGNOSIS — Z79899 Other long term (current) drug therapy: Secondary | ICD-10-CM

## 2022-07-28 DIAGNOSIS — E1122 Type 2 diabetes mellitus with diabetic chronic kidney disease: Secondary | ICD-10-CM

## 2022-07-28 DIAGNOSIS — N182 Chronic kidney disease, stage 2 (mild): Secondary | ICD-10-CM

## 2022-07-28 DIAGNOSIS — E1169 Type 2 diabetes mellitus with other specified complication: Secondary | ICD-10-CM

## 2022-07-28 DIAGNOSIS — E559 Vitamin D deficiency, unspecified: Secondary | ICD-10-CM

## 2022-07-28 NOTE — Progress Notes (Signed)
Future Appointments  Date Time Provider [place]  07/09/2021 10:30 AM Unk Pinto, MD [facility] None  07/13/2021  3:00 PM Newton Pigg, [facility] None  01/11/2022 11:00 AM Unk Pinto, MD [facility] None  04/08/2022 11:00 AM Liane Comber, NP [facility] None    History of Present Illness:       This very nice 81 y.o. MWM presents for 6 month follow up with HTN, HLD, T2_NIDDM and Vitamin D Deficiency.        Patient is treated for HTN (2010)  & BP has been controlled at home. Today's BP is at goal -  130/60. Patient has had no complaints of any cardiac type chest pain, palpitations, dyspnea / orthopnea / PND, dizziness, claudication, or dependent edema.  Atorvastatin     Hyperlipidemia is controlled with diet /. Patient denies myalgias or other med SE's. Last Lipids were at goal:  Lab Results  Component Value Date   CHOL 124 04/08/2021   HDL 51 04/08/2021   LDLCALC 60 04/08/2021   TRIG 58 04/08/2021   CHOLHDL 2.4 04/08/2021     Also, the patient has history of  PreDM  (A1c 6.1% /2011 ) and then T2_NIDDM (A1c 6.5% -2015 and 6.7% -2017)  w/CKD2  (GFR 63). Patient is on Metformin  and has had no symptoms of reactive hypoglycemia, diabetic polys, paresthesias or visual blurring.  Last A1c was not at goal:  Lab Results  Component Value Date   HGBA1C 6.5 (H) 04/08/2021                                                            Further, the patient also has history of Vitamin D Deficiency ("29" /2008) and supplements vitamin D without any suspected side-effects. Last vitamin D was elevated & dose was tapered :   Lab Results  Component Value Date   VD25OH 130 (H) 01/07/2021     Current Outpatient Medications on File Prior to Visit  Medication Sig   APPLE CIDER VINEGAR  Take daily   aspirin 81 MG tablet Take daily.   atorvastatin  40 MG tablet DAILY FOR CHOLESTEROL   VITAMIN D  5,000 Units  Take daily.   CINNAMON  Take Take 1-2 tablets daily    VITAMIN B 12  Take 1 tablet daily.   famotidine ( 40 MG tablet Take  1 tablet  at Bedtime     metFORMIN 500 MG tablet Take  1 tablet  3 x /day  with Meals    Omega-3 FISH OIL 1000 MG  Take 1 capsule by mouth daily.   pantoprazole 40 MG tablet Take  1 tablet  Daily  to Prevent Heartburn & Indigestion   sildenafil (REVATIO) 20 MG  Take 1-5 tablets 1 hour  before XXXX   tamsulosin 0.4 MG CAPS capsule Take  1 capsule  Daily  for Prostate   zinc 50 MG tablet Take daily.    Allergies  Allergen Reactions   Bystolic [Nebivolol Hcl] ?    PMHx:   Past Medical History:  Diagnosis Date   BPH (benign prostatic hyperplasia)    Cataract    surgical removal bilateral   Diabetes mellitus without complication [facility])    ED (erectile dysfunction)    GERD (gastroesophageal reflux disease)    History  of adenomatous polyp of colon 11/02/2017   Tubular adenoma 2013; recommended 5 year follow up   History of kidney stones    Hyperlipidemia    Hypertension    IBS (irritable bowel syndrome)    Kidney stones 02/11/2020   LBBB (left bundle branch block)    Personal history of kidney stones    Vitamin D deficiency      Immunization History  Administered Date(s) Administered   DT (Pediatric) 04/08/2014   Influenza, High Dose  07/31/2017, 05/30/2018, 06/20/2019, 06/08/2020   Influenza 07/02/2013   PFIZER SARS-COV-2 Vacc 10/03/2019, 10/24/2019, 06/17/2020   Pneumococcal -13 04/09/2015   Pneumococcal-23 10/02/2007   Td 10/02/2003   Zoster Recombinat (Shingrix) 05/28/2020   Zoster, Live 10/01/2006     Past Surgical History:  Procedure Laterality Date   CATARACT EXTRACTION Right 2015   CATARACT EXTRACTION W/ INTRAOCULAR LENS IMPLANT  2013   left   COLONOSCOPY  2013   TA   EXTRACORPOREAL SHOCK WAVE LITHOTRIPSY Left 12/11/2017   Procedure: LEFT EXTRACORPOREAL SHOCK WAVE LITHOTRIPSY (ESWL);  Surgeon: Lucas Mallow, MD;  Location: [facility];  Service: Urology;  Laterality: Left;   KIDNEY STONE  SURGERY     POLYPECTOMY      FHx:    Reviewed / unchanged  SHx:    Reviewed / unchanged   Systems Review:  Constitutional: Denies fever, chills, wt changes, headaches, insomnia, fatigue, night sweats, change in appetite. Eyes: Denies redness, blurred vision, diplopia, discharge, itchy, watery eyes.  ENT: Denies discharge, congestion, post nasal drip, epistaxis, sore throat, earache, hearing loss, dental pain, tinnitus, vertigo, sinus pain, snoring.  CV: Denies chest pain, palpitations, irregular heartbeat, syncope, dyspnea, diaphoresis, orthopnea, PND, claudication or edema. Respiratory: denies cough, dyspnea, DOE, pleurisy, hoarseness, laryngitis, wheezing.  Gastrointestinal: Denies dysphagia, odynophagia, heartburn, reflux, water brash, abdominal pain or cramps, nausea, vomiting, bloating, diarrhea, constipation, hematemesis, melena, hematochezia  or hemorrhoids. Genitourinary: Denies dysuria, frequency, urgency, nocturia, hesitancy, discharge, hematuria or flank pain. Musculoskeletal: Denies arthralgias, myalgias, stiffness, jt. swelling, pain, limping or strain/sprain.  Skin: Denies pruritus, rash, hives, warts, acne, eczema or change in skin lesion(s). Neuro: No weakness, tremor, incoordination, spasms, paresthesia or pain. Psychiatric: Denies confusion, memory loss or sensory loss. Endo: Denies change in weight, skin or hair change.  Heme/Lymph: No excessive bleeding, bruising or enlarged lymph nodes.  Physical Exam  BP 130/60   Pulse (!) 51   Temp 97.9 F (36.6 C)   Resp 16   Ht 5' 8.5" (1.74 m)   Wt 174 lb 9.6 oz (79.2 kg)   SpO2 95%   BMI 26.16 kg/m   Appears  well nourished, well groomed  and in no distress.  Eyes: PERRLA, EOMs, conjunctiva no swelling or erythema. Sinuses: No frontal/maxillary tenderness ENT/Mouth: EAC's clear, TM's nl w/o erythema, bulging. Nares clear w/o erythema, swelling, exudates. Oropharynx clear without erythema or exudates. Oral hygiene is  good. Tongue normal, non obstructing. Hearing intact.  Neck: Supple. Thyroid not palpable. Car 2+/2+ without bruits, nodes or JVD. Chest: Respirations nl with BS clear & equal w/o rales, rhonchi, wheezing or stridor.  Cor: Heart sounds normal w/ regular rate and rhythm without sig. murmurs, gallops, clicks or rubs. Peripheral pulses normal and equal  without edema.  Abdomen: Soft & bowel sounds normal. Non-tender w/o guarding, rebound, hernias, masses or organomegaly.  Lymphatics: Unremarkable.  Musculoskeletal: Full ROM all peripheral extremities, joint stability, 5/5 strength and normal gait.  Skin: Warm, dry without exposed rashes, lesions or ecchymosis apparent.  Neuro: Cranial nerves intact, reflexes equal bilaterally. Sensory-motor testing grossly intact. Tendon reflexes grossly intact.  Pysch: Alert & oriented x 3.  Insight and judgement nl & appropriate. No ideations.  Assessment and Plan:  1. Essential hypertension  - Continue medication, monitor blood pressure at home.  - Continue DASH diet.  Reminder to go to the ER if any CP,  SOB, nausea, dizziness, severe HA, changes vision/speech.   - CBC with Differential/Platelet - COMPLETE METABOLIC PANEL WITH GFR - Magnesium - TSH  2. Hyperlipidemia associated with type 2 diabetes mellitus ([place])  - Continue diet/meds, exercise,& lifestyle modifications.  - Continue monitor periodic cholesterol/liver & renal functions     - Lipid panel  3. Type 2 diabetes mellitus with stage 2 chronic kidney  disease, without long-term current use of insulin (HCC)  - Continue diet, exercise  - Lifestyle modifications.  - Monitor appropriate labs   - Hemoglobin A1c - Insulin, random  4. Vitamin D deficiency  - Continue supplementation   - VITAMIN D 25 Hydroxy   5. Medication management  - CBC with Differential/Platelet - COMPLETE METABOLIC PANEL WITH GFR - Magnesium - Lipid panel - TSH - Hemoglobin A1c - Insulin, random -  VITAMIN D 25 Hydroxy          Discussed  regular exercise, BP monitoring, weight control to achieve/maintain BMI less than 25 and discussed med and SE's. Recommended labs to assess and monitor clinical status with further disposition pending results of labs.  I discussed the assessment and treatment plan with the patient. The patient was provided an opportunity to ask questions and all were answered. The patient agreed with the plan and demonstrated an understanding of the instructions.  I provided over 30 minutes of exam, counseling, chart review and  complex critical decision making.        The patient was advised to call back or seek an in-person evaluation if the symptoms worsen or if the condition fails to improve as anticipated.   Kirtland Bouchard, MD

## 2022-07-28 NOTE — Patient Instructions (Signed)

## 2022-07-29 LAB — VITAMIN D 25 HYDROXY (VIT D DEFICIENCY, FRACTURES): Vit D, 25-Hydroxy: 96 ng/mL (ref 30–100)

## 2022-07-29 LAB — COMPLETE METABOLIC PANEL WITH GFR
AG Ratio: 1.8 (calc) (ref 1.0–2.5)
ALT: 8 U/L — ABNORMAL LOW (ref 9–46)
AST: 7 U/L — ABNORMAL LOW (ref 10–35)
Albumin: 3.8 g/dL (ref 3.6–5.1)
Alkaline phosphatase (APISO): 61 U/L (ref 35–144)
BUN/Creatinine Ratio: 13 (calc) (ref 6–22)
BUN: 19 mg/dL (ref 7–25)
CO2: 29 mmol/L (ref 20–32)
Calcium: 9.2 mg/dL (ref 8.6–10.3)
Chloride: 108 mmol/L (ref 98–110)
Creat: 1.41 mg/dL — ABNORMAL HIGH (ref 0.70–1.22)
Globulin: 2.1 g/dL (calc) (ref 1.9–3.7)
Glucose, Bld: 119 mg/dL — ABNORMAL HIGH (ref 65–99)
Potassium: 4.2 mmol/L (ref 3.5–5.3)
Sodium: 142 mmol/L (ref 135–146)
Total Bilirubin: 0.6 mg/dL (ref 0.2–1.2)
Total Protein: 5.9 g/dL — ABNORMAL LOW (ref 6.1–8.1)
eGFR: 50 mL/min/{1.73_m2} — ABNORMAL LOW (ref >=60)

## 2022-07-29 LAB — CBC WITH DIFFERENTIAL/PLATELET
Absolute Monocytes: 461 cells/uL (ref 200–950)
Basophils Absolute: 19 cells/uL (ref 0–200)
Basophils Relative: 0.4 %
Eosinophils Absolute: 101 cells/uL (ref 15–500)
Eosinophils Relative: 2.1 %
HCT: 40.4 % (ref 38.5–50.0)
Hemoglobin: 13.5 g/dL (ref 13.2–17.1)
Lymphs Abs: 1397 cells/uL (ref 850–3900)
MCH: 30 pg (ref 27.0–33.0)
MCHC: 33.4 g/dL (ref 32.0–36.0)
MCV: 89.8 fL (ref 80.0–100.0)
MPV: 12.3 fL (ref 7.5–12.5)
Monocytes Relative: 9.6 %
Neutro Abs: 2822 cells/uL (ref 1500–7800)
Neutrophils Relative %: 58.8 %
Platelets: 153 10*3/uL (ref 140–400)
RBC: 4.5 10*6/uL (ref 4.20–5.80)
RDW: 12.2 % (ref 11.0–15.0)
Total Lymphocyte: 29.1 %
WBC: 4.8 10*3/uL (ref 3.8–10.8)

## 2022-07-29 LAB — LIPID PANEL
Cholesterol: 107 mg/dL (ref <200)
HDL: 48 mg/dL (ref >=40)
LDL Cholesterol (Calc): 46 mg/dL (calc)
Non-HDL Cholesterol (Calc): 59 mg/dL (calc) (ref <130)
Total CHOL/HDL Ratio: 2.2 (calc) (ref <5.0)
Triglycerides: 44 mg/dL (ref <150)

## 2022-07-29 LAB — MAGNESIUM: Magnesium: 1.7 mg/dL (ref 1.5–2.5)

## 2022-07-29 LAB — HEMOGLOBIN A1C
Hgb A1c MFr Bld: 6.6 % of total Hgb — ABNORMAL HIGH (ref <5.7)
Mean Plasma Glucose: 143 mg/dL
eAG (mmol/L): 7.9 mmol/L

## 2022-07-29 LAB — TSH: TSH: 1.63 mIU/L (ref 0.40–4.50)

## 2022-07-29 LAB — INSULIN, RANDOM: Insulin: 19.4 u[IU]/mL — ABNORMAL HIGH

## 2022-07-30 ENCOUNTER — Encounter: Payer: Self-pay | Admitting: Internal Medicine

## 2022-07-30 NOTE — Progress Notes (Signed)
<><><><><><><><><><><><><><><><><><><><><><><><><><><><><><><><><> <><><><><><><><><><><><><><><><><><><><><><><><><><><><><><><><><> -   Test results slightly outside the reference range are not unusual. If there is anything important, I will review this with you,  otherwise it is considered normal test values.  If you have further questions,  please do not hesitate to contact me at the office or via My Chart.  <><><><><><><><><><><><><><><><><><><><><><><><><><><><><><><><><> <><><><><><><><><><><><><><><><><><><><><><><><><><><><><><><><><>  -  Kidney Functions still Stage 3a  & Stable <><><><><><><><><><><><><><><><><><><><><><><><><><><><><><><><><>  -  A1c = 6.6%  still too high - Ideal or goal is less than 5.7 %   - So Please work harder on diet  <><><><><><><><><><><><><><><><><><><><><><><><><><><><><><><><><>  -   Magnesium  -  1.7   -  very  low- goal is betw 2.0 - 2.5,   - So..............Marland Kitchen  Recommend that you take  Magnesium 500 mg tablet  2 x /day   - also important to eat lots of  leafy green vegetables   - spinach - Kale - collards - greens - okra - asparagus  - broccoli - quinoa - squash - almonds   - black, red, white beans -  peas - green beans <><><><><><><><><><><><><><><><><><><><><><><><><><><><><><><><><> <><><><><><><><><><><><><><><><><><><><><><><><><><><><><><><><><>  -  Total  Chol =    107   - Excellent            (  Ideal  or  Goal is less than 180  !  )  & -  Bad / Dangerous LDL  Chol =  46   - also Excellent             (  Ideal  or  Goal is less than 70  !  )   - Very low risk for Heart Attack  / Stroke <><><><><><><><><><><><><><><><><><><><><><><><><><><><><><><><><> <><><><><><><><><><><><><><><><><><><><><><><><><><><><><><><><><>  - Vitamin D = 96 =- Excellent   - Please keep dose same  <><><><><><><><><><><><><><><><><><><><><><><><><><><><><><><><><> <><><><><><><><><><><><><><><><><><><><><><><><><><><><><><><><><>  -  All Else -  CBC - Kidneys - Electrolytes - Liver - Magnesium & Thyroid    - all  Normal / OK  <><><><><><><><><><><><><><><><><><><><><><><><><><><><><><><><><> <><><><><><><><><><><><><><><><><><><><><><><><><><><><><><><><><>

## 2022-08-07 ENCOUNTER — Other Ambulatory Visit: Payer: Self-pay | Admitting: Nurse Practitioner

## 2022-08-07 DIAGNOSIS — E1169 Type 2 diabetes mellitus with other specified complication: Secondary | ICD-10-CM

## 2022-08-10 ENCOUNTER — Other Ambulatory Visit: Payer: Self-pay | Admitting: Nurse Practitioner

## 2022-08-10 DIAGNOSIS — E1122 Type 2 diabetes mellitus with diabetic chronic kidney disease: Secondary | ICD-10-CM

## 2022-08-10 MED ORDER — METFORMIN HCL 500 MG PO TABS: ORAL_TABLET | ORAL | 3 refills | Status: DC

## 2022-08-26 ENCOUNTER — Encounter: Payer: Self-pay | Admitting: Internal Medicine

## 2022-10-05 ENCOUNTER — Encounter: Payer: Self-pay | Admitting: Internal Medicine

## 2022-10-14 ENCOUNTER — Ambulatory Visit: Payer: PPO | Admitting: Nurse Practitioner

## 2022-11-04 ENCOUNTER — Ambulatory Visit: Payer: PPO | Admitting: Nurse Practitioner

## 2022-11-04 ENCOUNTER — Other Ambulatory Visit: Payer: Self-pay | Admitting: Nurse Practitioner

## 2022-11-04 DIAGNOSIS — I10 Essential (primary) hypertension: Secondary | ICD-10-CM

## 2022-11-04 DIAGNOSIS — K219 Gastro-esophageal reflux disease without esophagitis: Secondary | ICD-10-CM

## 2022-11-06 ENCOUNTER — Other Ambulatory Visit: Payer: Self-pay | Admitting: Nurse Practitioner

## 2022-11-06 DIAGNOSIS — N138 Other obstructive and reflux uropathy: Secondary | ICD-10-CM

## 2022-11-07 ENCOUNTER — Ambulatory Visit (INDEPENDENT_AMBULATORY_CARE_PROVIDER_SITE_OTHER): Payer: HMO | Admitting: Nurse Practitioner

## 2022-11-07 ENCOUNTER — Encounter: Payer: Self-pay | Admitting: Nurse Practitioner

## 2022-11-07 VITALS — BP 158/82 | HR 69 | Temp 97.7°F | Ht 68.5 in | Wt 175.0 lb

## 2022-11-07 DIAGNOSIS — N1831 Chronic kidney disease, stage 3a: Secondary | ICD-10-CM

## 2022-11-07 DIAGNOSIS — I10 Essential (primary) hypertension: Secondary | ICD-10-CM | POA: Diagnosis not present

## 2022-11-07 DIAGNOSIS — K2100 Gastro-esophageal reflux disease with esophagitis, without bleeding: Secondary | ICD-10-CM | POA: Diagnosis not present

## 2022-11-07 DIAGNOSIS — N138 Other obstructive and reflux uropathy: Secondary | ICD-10-CM | POA: Diagnosis not present

## 2022-11-07 DIAGNOSIS — R6889 Other general symptoms and signs: Secondary | ICD-10-CM

## 2022-11-07 DIAGNOSIS — N401 Enlarged prostate with lower urinary tract symptoms: Secondary | ICD-10-CM

## 2022-11-07 DIAGNOSIS — N183 Chronic kidney disease, stage 3 unspecified: Secondary | ICD-10-CM | POA: Diagnosis not present

## 2022-11-07 DIAGNOSIS — E663 Overweight: Secondary | ICD-10-CM | POA: Diagnosis not present

## 2022-11-07 DIAGNOSIS — E1169 Type 2 diabetes mellitus with other specified complication: Secondary | ICD-10-CM

## 2022-11-07 DIAGNOSIS — E1122 Type 2 diabetes mellitus with diabetic chronic kidney disease: Secondary | ICD-10-CM

## 2022-11-07 DIAGNOSIS — E559 Vitamin D deficiency, unspecified: Secondary | ICD-10-CM | POA: Diagnosis not present

## 2022-11-07 DIAGNOSIS — Z79899 Other long term (current) drug therapy: Secondary | ICD-10-CM

## 2022-11-07 DIAGNOSIS — E785 Hyperlipidemia, unspecified: Secondary | ICD-10-CM | POA: Diagnosis not present

## 2022-11-07 DIAGNOSIS — Z0000 Encounter for general adult medical examination without abnormal findings: Secondary | ICD-10-CM

## 2022-11-07 DIAGNOSIS — Z0001 Encounter for general adult medical examination with abnormal findings: Secondary | ICD-10-CM | POA: Diagnosis not present
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
## 2022-11-07 NOTE — Patient Instructions (Signed)

## 2022-11-08 LAB — LIPID PANEL
Cholesterol: 117 mg/dL (ref <200)
HDL: 56 mg/dL (ref >=40)
LDL Cholesterol (Calc): 48 mg/dL (calc)
Non-HDL Cholesterol (Calc): 61 mg/dL (calc) (ref <130)
Total CHOL/HDL Ratio: 2.1 (calc) (ref <5.0)
Triglycerides: 47 mg/dL (ref <150)

## 2022-11-08 LAB — CBC WITH DIFFERENTIAL/PLATELET
Absolute Monocytes: 440 cells/uL (ref 200–950)
Basophils Absolute: 11 cells/uL (ref 0–200)
Basophils Relative: 0.2 %
Eosinophils Absolute: 83 cells/uL (ref 15–500)
Eosinophils Relative: 1.5 %
HCT: 42.8 % (ref 38.5–50.0)
Hemoglobin: 14.2 g/dL (ref 13.2–17.1)
Lymphs Abs: 1375 cells/uL (ref 850–3900)
MCH: 29.6 pg (ref 27.0–33.0)
MCHC: 33.2 g/dL (ref 32.0–36.0)
MCV: 89.4 fL (ref 80.0–100.0)
MPV: 11.9 fL (ref 7.5–12.5)
Monocytes Relative: 8 %
Neutro Abs: 3592 cells/uL (ref 1500–7800)
Neutrophils Relative %: 65.3 %
Platelets: 147 10*3/uL (ref 140–400)
RBC: 4.79 10*6/uL (ref 4.20–5.80)
RDW: 12.7 % (ref 11.0–15.0)
Total Lymphocyte: 25 %
WBC: 5.5 10*3/uL (ref 3.8–10.8)

## 2022-11-08 LAB — COMPLETE METABOLIC PANEL WITH GFR
AG Ratio: 1.9 (calc) (ref 1.0–2.5)
ALT: 12 U/L (ref 9–46)
AST: 11 U/L (ref 10–35)
Albumin: 4.2 g/dL (ref 3.6–5.1)
Alkaline phosphatase (APISO): 68 U/L (ref 35–144)
BUN: 18 mg/dL (ref 7–25)
CO2: 30 mmol/L (ref 20–32)
Calcium: 9.4 mg/dL (ref 8.6–10.3)
Chloride: 106 mmol/L (ref 98–110)
Creat: 1.19 mg/dL (ref 0.70–1.22)
Globulin: 2.2 g/dL (calc) (ref 1.9–3.7)
Glucose, Bld: 137 mg/dL — ABNORMAL HIGH (ref 65–99)
Potassium: 4.4 mmol/L (ref 3.5–5.3)
Sodium: 141 mmol/L (ref 135–146)
Total Bilirubin: 0.7 mg/dL (ref 0.2–1.2)
Total Protein: 6.4 g/dL (ref 6.1–8.1)
eGFR: 62 mL/min/{1.73_m2} (ref >=60)

## 2022-11-08 LAB — HEMOGLOBIN A1C
Hgb A1c MFr Bld: 6.8 % of total Hgb — ABNORMAL HIGH (ref <5.7)
Mean Plasma Glucose: 148 mg/dL
eAG (mmol/L): 8.2 mmol/L

## 2022-11-08 LAB — VITAMIN D 25 HYDROXY (VIT D DEFICIENCY, FRACTURES): Vit D, 25-Hydroxy: 101 ng/mL — ABNORMAL HIGH (ref 30–100)

## 2022-11-13 ENCOUNTER — Encounter: Payer: Self-pay | Admitting: Internal Medicine

## 2022-11-28 ENCOUNTER — Other Ambulatory Visit: Payer: Self-pay | Admitting: Nurse Practitioner

## 2022-11-28 DIAGNOSIS — K219 Gastro-esophageal reflux disease without esophagitis: Secondary | ICD-10-CM

## 2023-01-16 ENCOUNTER — Encounter: Payer: PPO | Admitting: Internal Medicine

## 2023-02-02 ENCOUNTER — Other Ambulatory Visit: Payer: Self-pay | Admitting: Nurse Practitioner

## 2023-02-02 DIAGNOSIS — I10 Essential (primary) hypertension: Secondary | ICD-10-CM

## 2023-02-09 ENCOUNTER — Encounter: Payer: Self-pay | Admitting: Internal Medicine

## 2023-02-09 NOTE — Patient Instructions (Signed)

## 2023-02-09 NOTE — Progress Notes (Signed)
Annual  Screening/Preventative Visit  & Comprehensive Evaluation & Examination  Future Appointments  Date Time Provider Department  02/10/2023 10:00 AM Lucky Cowboy, MD [id number]         This very nice 82 y.o. MWM  with   HTN, HLD, T2_NIDDM  and Vitamin D Deficiency  presents for a Screening /Preventative Visit & comprehensive evaluation and management of multiple medical co-morbidities.  Patient has GERD controlled with diet & his meds.        HTN predates circa 2010. Patient's BP has been controlled and today's BP  is at goal -   122/60 . Patient denies any cardiac symptoms as chest pain, palpitations, shortness of breath, dizziness or ankle swelling.       Patient's hyperlipidemia is controlled with diet and medications. Patient denies myalgias or other medication SE's. Last lipids were not at goal :  Lab Results  Component Value Date   CHOL 117 11/07/2022   HDL 56 11/07/2022   LDLCALC 48 11/07/2022   TRIG 47 11/07/2022   CHOLHDL 2.1 11/07/2022         Patient was initially PreDiabetes (A1c 6.1% /2011), then T2_NIDDM (A1c 6.5% /2015 & 6.7% /2017) w/CKD3a (GFR 52).   Patient takes Metformin, Cinnamon & Vinegar and denies reactive hypoglycemic symptoms, visual blurring, diabetic polys or paresthesias. Last A1c was not at goal :   Lab Results  Component Value Date   HGBA1C 6.8 (H) 11/07/2022         Finally, patient has history of Vitamin D Deficiency ("29" /2008) and last vitamin D was at goal :   Lab Results  Component Value Date   VD25OH 101 (H) 11/07/2022        Current Outpatient Medications on File Prior to Visit  Medication Sig   APPLE CIDER VINEGAR Take 1-2 tablets daily.   aspirin 81 MG  Take  3 times a week.   atorvastatin 40 MG  TAKE 1 TABLET DAILY    VITAMIN D 5,000 Units  Take daily.   CINNAMON PO Take 1-2 tablets  daily.   VITAMIN B 12  Take 1 tablet daily.   famotidine 40 MG  Take  1 tablet  at Bedtime   FLONASE nasal spray 1-2 Sprays in  each nostril daily   losartan 50 MG  Take 1 tab daily   metFORMIN  500 MG Take  1 tablet  3 x /day    OMEGA-3  cap 700 mg Take   daily.   pantoprazole  40 MG  Take  1 tablet  Daily  to Prevent Heartburn & Indigestion   tamsulosin 0.4 MG  Take  1 capsule  Daily  for Prostate   zinc 50 MG  Take 50 mg by mouth daily.    Allergies  Allergen Reactions   Bystolic [Nebivolol Hcl]     Past Medical History:  Diagnosis Date   BPH (benign prostatic hyperplasia)    Cataract    surgical removal bilateral   Diabetes mellitus without complication (HCC)    ED (erectile dysfunction)    GERD (gastroesophageal reflux disease)    History of adenomatous polyp of colon 11/02/2017   Tubular adenoma 2013; recommended 5 year follow up   History of kidney stones    Hyperlipidemia    Hypertension    IBS (irritable bowel syndrome)    Kidney stones 02/11/2020   LBBB (left bundle branch block)    Personal history of kidney stones    Pre-diabetes  Prediabetes    Vitamin D deficiency     Health Maintenance  Topic Date Due   Hepatitis C Screening  Never done   Pneumonia Vaccine 36+ Years old (2 - PPSV23 if available, else PCV20) 04/08/2016   Zoster Vaccines- Shingrix (2 of 2) 07/23/2020   COVID-19 Vaccine (4 - Booster for Pfizer series) 08/12/2020   FOOT EXAM  01/06/2022   INFLUENZA VACCINE  04/12/2022   HEMOGLOBIN A1C  04/13/2022   OPHTHALMOLOGY EXAM  06/18/2022   TETANUS/TDAP  04/08/2024   HPV VACCINES  Aged Out    Immunization History  Administered Date(s) Administered   DT 04/08/2014   Influenza, High Dose 05/30/2018, 06/20/2019, 06/08/2020, 07/09/2021   Influenza 07/02/2013   PFIZER-SARS-COV-2 Vacc 10/03/2019, 10/24/2019, 06/17/2020   Pneumococcal-13 04/09/2015   Pneumococcal-23 10/02/2007   Td 10/02/2003   Zoster Recombinat (Shingrix) 05/28/2020   Zoster, Live 10/01/2006    Last Colon - 12/24/2019 - Dr  Marina Goodell - recommended No f/u due to age.    Past Surgical History:  Procedure  Laterality Date   CATARACT EXTRACTION Right 2015   CATARACT EXTRACTION W/ INTRAOCULAR LENS IMPLANT  2013   left   COLONOSCOPY  2013   TA   EXTRACORPOREAL SHOCK WAVE LITHOTRIPSY Left 12/11/2017   Procedure: LEFT EXTRACORPOREAL SHOCK WAVE LITHOTRIPSY (ESWL);  Surgeon: Crista Elliot, MD;  Location: [facility];  Service: Urology;  Laterality: Left;   KIDNEY STONE SURGERY     POLYPECTOMY      Family History  Problem Relation Age of Onset   Diabetes Sister    Hypertension Sister    Heart disease Mother    Diabetes Sister    Hypertension Sister    Colon cancer Neg Hx    Colon polyps Neg Hx    Esophageal cancer Neg Hx    Rectal cancer Neg Hx    Stomach cancer Neg Hx     Social History   Tobacco Use   Smoking status: Former    Packs/day: 0.50    Years: 10.00    Pack years: 5.00    Types: Cigarettes    Quit date: 09/13/1967    Years since quitting: 54.3   Smokeless tobacco: Never  Vaping Use   Vaping Use: Never used  Substance Use Topics   Alcohol use: No   Drug use: No     ROS Constitutional: Denies fever, chills, weight loss/gain, headaches, insomnia,  night sweats or change in appetite. Does c/o fatigue. Eyes: Denies redness, blurred vision, diplopia, discharge, itchy or watery eyes.  ENT: Denies discharge, congestion, post nasal drip, epistaxis, sore throat, earache, hearing loss, dental pain, Tinnitus, Vertigo, Sinus pain or snoring.  Cardio: Denies chest pain, palpitations, irregular heartbeat, syncope, dyspnea, diaphoresis, orthopnea, PND, claudication or edema Respiratory: denies cough, dyspnea, DOE, pleurisy, hoarseness, laryngitis or wheezing.  Gastrointestinal: Denies dysphagia, heartburn, reflux, water brash, pain, cramps, nausea, vomiting, bloating, diarrhea, constipation, hematemesis, melena, hematochezia, jaundice or hemorrhoids Genitourinary: Denies dysuria, frequency,  discharge, hematuria or flank pain. Has urgency, nocturia x 2-3 & occasional  hesitancy. Musculoskeletal: Denies arthralgia, myalgia, stiffness, Jt. Swelling, pain, limp or strain/sprain. Denies Falls. Skin: Denies puritis, rash, hives, warts, acne, eczema or change in skin lesion Neuro: No weakness, tremor, incoordination, spasms, paresthesia or pain Psychiatric: Denies confusion, memory loss or sensory loss. Denies Depression. Endocrine: Denies change in weight, skin, hair change, nocturia, and paresthesia, diabetic polys, visual blurring or hyper / hypo glycemic episodes.  Heme/Lymph: No excessive bleeding, bruising or enlarged lymph nodes.  Physical Exam  BP 122/60   Pulse (!) 54   Temp 97.9 F (36.6 C)   Resp 16   Ht 5' 8.5" (1.74 m)   Wt 173 lb 9.6 oz (78.7 kg)   SpO2 97%   BMI 26.01 kg/m   General Appearance: Well nourished and well groomed and in no apparent distress.  Eyes: PERRLA, EOMs, conjunctiva no swelling or erythema, normal fundi and vessels. Sinuses: No frontal/maxillary tenderness ENT/Mouth: EACs patent / TMs  nl. Nares clear without erythema, swelling, mucoid exudates. Oral hygiene is good. No erythema, swelling, or exudate. Tongue normal, non-obstructing. Tonsils not swollen or erythematous. Hearing normal.  Neck: Supple, thyroid not palpable. No bruits, nodes or JVD. Respiratory: Respiratory effort normal.  BS equal and clear bilateral without rales, rhonci, wheezing or stridor. Cardio: Heart sounds are normal with regular rate and rhythm and no murmurs, rubs or gallops. Peripheral pulses are normal and equal bilaterally without edema. No aortic or femoral bruits. Chest: symmetric with normal excursions and percussion.  Abdomen: Soft, with Nl bowel sounds. Nontender, no guarding, rebound, hernias, masses, or organomegaly.  Lymphatics: Non tender without lymphadenopathy.  Musculoskeletal: Full ROM all peripheral extremities, joint stability, 5/5 strength, and normal gait. Skin: Warm and dry without rashes, lesions, cyanosis, clubbing or   ecchymosis.  Neuro: Cranial nerves intact, reflexes equal bilaterally. Normal muscle tone, no cerebellar symptoms. Sensation intact.  Pysch: Alert and oriented X 3 with normal affect, insight and judgment appropriate.   Assessment and Plan  1. Annual Preventative/Screening Exam    2. Essential hypertension  - EKG 12-Lead - [place], RETROPERITNL ABD,  LTD - Urinalysis, Routine w reflex microscopic - Microalbumin / creatinine urine ratio - CBC with Differential/Platelet - COMPLETE METABOLIC PANEL WITH GFR - Magnesium - TSH  3. Hyperlipidemia associated with type 2 diabetes mellitus (HCC)  - EKG 12-Lead - [place], RETROPERITNL ABD,  LTD - Lipid panel - TSH  4. Type 2 diabetes mellitus with stage 3a chronic kidney                                       disease, without long-term current use of insulin (HCC)  - EKG 12-Lead - [place], RETROPERITNL ABD,  LTD - HM DIABETES FOOT EXAM - PR LOW EXTEMITY NEUR EXAM DOCUM - PTH, intact and calcium - Hemoglobin A1c - Insulin, random  5. Vitamin D deficiency  - VITAMIN D 25 Hydroxy   6. Gastroesophageal reflux disease   - CBC with Differential/Platelet  7. BPH with obstruction/lower urinary tract symptoms  - PSA  8. Prostate cancer screening  - PSA  9. Screening for colorectal cancer  - POC Hemoccult Bld/Stl   10. Screening for heart disease  - EKG 12-Lead  11. FHx: heart disease  - EKG 12-Lead - [place], RETROPERITNL ABD,  LTD  12. Former smoker  - EKG 12-Lead - [place], RETROPERITNL ABD,  LTD  13. Screening for AAA (aortic abdominal aneurysm)  - [place], RETROPERITNL ABD,  LTD  14. Medication management  - Urinalysis, Routine w reflex microscopic - Microalbumin / creatinine urine ratio - CBC with Differential/Platelet - COMPLETE METABOLIC PANEL WITH GFR - Magnesium - Lipid panel - TSH - Hemoglobin A1c - Insulin, random - VITAMIN D 25 Hydroxy            Patient was counseled in prudent diet, weight control to  achieve/maintain BMI less than 25, BP monitoring,  regular exercise and medications as discussed.  Discussed med effects and SE's. Routine screening labs and tests as requested with regular follow-up as recommended. Over 40 minutes of exam, counseling, chart review and high complex critical decision making was performed   Edward Maw, MD.

## 2023-02-10 ENCOUNTER — Ambulatory Visit: Payer: PPO | Admitting: Internal Medicine

## 2023-02-10 ENCOUNTER — Encounter: Payer: Self-pay | Admitting: Internal Medicine

## 2023-02-10 VITALS — BP 122/60 | HR 54 | Temp 97.9°F | Resp 16 | Ht 68.5 in | Wt 173.6 lb

## 2023-02-10 DIAGNOSIS — Z136 Encounter for screening for cardiovascular disorders: Secondary | ICD-10-CM

## 2023-02-10 DIAGNOSIS — K2100 Gastro-esophageal reflux disease with esophagitis, without bleeding: Secondary | ICD-10-CM

## 2023-02-10 DIAGNOSIS — E1122 Type 2 diabetes mellitus with diabetic chronic kidney disease: Secondary | ICD-10-CM

## 2023-02-10 DIAGNOSIS — N401 Enlarged prostate with lower urinary tract symptoms: Secondary | ICD-10-CM | POA: Diagnosis not present

## 2023-02-10 DIAGNOSIS — N1831 Chronic kidney disease, stage 3a: Secondary | ICD-10-CM | POA: Diagnosis not present

## 2023-02-10 DIAGNOSIS — Z87891 Personal history of nicotine dependence: Secondary | ICD-10-CM

## 2023-02-10 DIAGNOSIS — Z79899 Other long term (current) drug therapy: Secondary | ICD-10-CM | POA: Diagnosis not present

## 2023-02-10 DIAGNOSIS — Z125 Encounter for screening for malignant neoplasm of prostate: Secondary | ICD-10-CM | POA: Diagnosis not present

## 2023-02-10 DIAGNOSIS — Z0000 Encounter for general adult medical examination without abnormal findings: Secondary | ICD-10-CM | POA: Diagnosis not present

## 2023-02-10 DIAGNOSIS — Z0001 Encounter for general adult medical examination with abnormal findings: Secondary | ICD-10-CM

## 2023-02-10 DIAGNOSIS — E559 Vitamin D deficiency, unspecified: Secondary | ICD-10-CM | POA: Diagnosis not present

## 2023-02-10 DIAGNOSIS — I10 Essential (primary) hypertension: Secondary | ICD-10-CM

## 2023-02-10 DIAGNOSIS — Z1211 Encounter for screening for malignant neoplasm of colon: Secondary | ICD-10-CM

## 2023-02-10 DIAGNOSIS — E1169 Type 2 diabetes mellitus with other specified complication: Secondary | ICD-10-CM | POA: Diagnosis not present

## 2023-02-10 DIAGNOSIS — E785 Hyperlipidemia, unspecified: Secondary | ICD-10-CM | POA: Diagnosis not present

## 2023-02-10 DIAGNOSIS — N138 Other obstructive and reflux uropathy: Secondary | ICD-10-CM | POA: Diagnosis not present

## 2023-02-10 DIAGNOSIS — Z8249 Family history of ischemic heart disease and other diseases of the circulatory system: Secondary | ICD-10-CM

## 2023-02-10 DIAGNOSIS — I700 Atherosclerosis of aorta: Secondary | ICD-10-CM | POA: Diagnosis not present

## 2023-02-10 LAB — CBC WITH DIFFERENTIAL/PLATELET
Eosinophils Relative: 1.4 %
Lymphs Abs: 1170 cells/uL (ref 850–3900)
MPV: 12.3 fL (ref 7.5–12.5)
Monocytes Relative: 8.5 %
Platelets: 122 10*3/uL — ABNORMAL LOW (ref 140–400)

## 2023-02-11 ENCOUNTER — Encounter: Payer: Self-pay | Admitting: Internal Medicine

## 2023-02-11 LAB — CBC WITH DIFFERENTIAL/PLATELET
Basophils Absolute: 21 cells/uL (ref 0–200)
Basophils Relative: 0.4 %
Eosinophils Absolute: 73 cells/uL (ref 15–500)
HCT: 41.6 % (ref 38.5–50.0)
MCV: 90.8 fL (ref 80.0–100.0)
Neutro Abs: 3494 cells/uL (ref 1500–7800)
Total Lymphocyte: 22.5 %
WBC: 5.2 10*3/uL (ref 3.8–10.8)

## 2023-02-11 LAB — COMPLETE METABOLIC PANEL WITH GFR
ALT: 11 U/L (ref 9–46)
Alkaline phosphatase (APISO): 74 U/L (ref 35–144)
Chloride: 109 mmol/L (ref 98–110)
Potassium: 4 mmol/L (ref 3.5–5.3)

## 2023-02-11 LAB — URINALYSIS, ROUTINE W REFLEX MICROSCOPIC
Glucose, UA: NEGATIVE
pH: 5.5 (ref 5.0–8.0)

## 2023-02-11 LAB — LIPID PANEL
HDL: 52 mg/dL (ref >=40)
Total CHOL/HDL Ratio: 1.9 (calc) (ref <5.0)
Triglycerides: 49 mg/dL (ref <150)

## 2023-02-11 LAB — MAGNESIUM: Magnesium: 2 mg/dL (ref 1.5–2.5)

## 2023-02-11 LAB — MICROALBUMIN / CREATININE URINE RATIO
Creatinine, Urine: 101 mg/dL (ref 20–320)
Microalb Creat Ratio: 16 mg/g creat (ref <30)

## 2023-02-11 LAB — PSA: PSA: 3.13 ng/mL (ref <=4.00)

## 2023-02-12 NOTE — Progress Notes (Signed)
^<^<^<^<^<^<^<^<^<^<^<^<^<^<^<^<^<^<^<^<^<^<^<^<^<^<^<^<^<^<^<^<^<^<^<^<^ ^>^>^>^>^>^>^>^>^>^>^>>^>^>^>^>^>^>^>^>^>^>^>^>^>^>^>^>^>^>^>^>^>^>^>^>^>  -  Test results slightly outside the reference range are not unusual. If there is anything important, I will review this with you,  otherwise it is considered normal test values.  If you have further questions,  please do not hesitate to contact me at the office or via My Chart.   ^<^<^<^<^<^<^<^<^<^<^<^<^<^<^<^<^<^<^<^<^<^<^<^<^<^<^<^<^<^<^<^<^<^<^<^<^ ^>^>^>^>^>^>^>^>^>^>^>^>^>^>^>^>^>^>^>^>^>^>^>^>^>^>^>^>^>^>^>^>^>^>^>^>^  -  Kidney functions Stable & OK  ^>^>^>^>^>^>^>^>^>^>^>^>^>^>^>^>^>^>^>^>^>^>^>^>^>^>^>^>^>^>^>^>^>^>^>^>^  - A1c = 6.6% - still in Diabetic Range - Be sure taking your Metformin &   - Avoid Sweets, Candy & White Stuff   - White Rice, White Potatoes, White Flour  - Breads &  Pasta ^>^>^>^>^>^>^>^>^>^>^>^>^>^>^>^>^>^>^>^>^>^>^>^>^>^>^>^>^>^>^>^>^>^>^>^>^ ^>^>^>^>^>^>^>^>^>^>^>^>^>^>^>^>^>^>^>^>^>^>^>^>^>^>^>^>^>^>^>^>^>^>^>^>^  -  PSA - OK - No Prostate cancer  ^>^>^>^>^>^>^>^>^>^>^>^>^>^>^>^>^>^>^>^>^>^>^>^>^>^>^>^>^>^>^>^>^>^>^>^>^  - Chol = 99     &   LDL = 33 both  Excellent   - Very low risk for Heart Attack  / Stroke  - Recommend that you decrease your Atorvastatin /Lipitor to 1/2 tablet  (20 mg)  Daily ^>^>^>^>^>^>^>^>^>^>^>^>^>^>^>^>^>^>^>^>^>^>^>^>^>^>^>^>^>^>^>^>^>^>^>^>^  -   Vitamin D = 73  - Excellent - please continue dosage same  ^>^>^>^>^>^>^>^>^>^>^>^>^>^>^>^>^>^>^>^>^>^>^>^>^>^>^>^>^>^>^>^>^>^>^>^>^  - All Else - CBC - Electrolytes - Liver - Magnesium & Thyroid    - all  Normal / OK ^>^>^>^>^>^>^>^>^>^>^>^>^>^>^>^>^>^>^>^>^>^>^>^>^>^>^>^>^>^>^>^>^>^>^>^>^ ^>^>^>^>^>^>^>^>^>^>^>^>^>^>^>^>^>^>^>^>^>^>^>^>^>^>^>^>^>^>^>^>^>^>^>^>^  - Keep up the [place] Work  !    ^>^>^>^>^>^>^>^>^>^>^>^>^>^>^>^>^>^>^>^>^>^>^>^>^>^>^>^>^>^>^>^>^>^>^>^>^ ^>^>^>^>^>^>^>^>^>^>^>^>^>^>^>^>^>^>^>^>^>^>^>^>^>^>^>^>^>^>^>^>^>^>^>^>^

## 2023-02-13 LAB — HEMOGLOBIN A1C
Hgb A1c MFr Bld: 6.6 % of total Hgb — ABNORMAL HIGH (ref <5.7)
Mean Plasma Glucose: 143 mg/dL
eAG (mmol/L): 7.9 mmol/L

## 2023-02-13 LAB — COMPLETE METABOLIC PANEL WITH GFR
AG Ratio: 2.2 (calc) (ref 1.0–2.5)
AST: 10 U/L (ref 10–35)
Albumin: 4.3 g/dL (ref 3.6–5.1)
BUN/Creatinine Ratio: 14 (calc) (ref 6–22)
BUN: 18 mg/dL (ref 7–25)
CO2: 27 mmol/L (ref 20–32)
Calcium: 9 mg/dL (ref 8.6–10.3)
Creat: 1.27 mg/dL — ABNORMAL HIGH (ref 0.70–1.22)
Globulin: 2 g/dL (calc) (ref 1.9–3.7)
Glucose, Bld: 123 mg/dL — ABNORMAL HIGH (ref 65–99)
Sodium: 144 mmol/L (ref 135–146)
Total Bilirubin: 0.8 mg/dL (ref 0.2–1.2)
Total Protein: 6.3 g/dL (ref 6.1–8.1)
eGFR: 57 mL/min/{1.73_m2} — ABNORMAL LOW (ref >=60)

## 2023-02-13 LAB — URINALYSIS, ROUTINE W REFLEX MICROSCOPIC
Bilirubin Urine: NEGATIVE
Hgb urine dipstick: NEGATIVE
Ketones, ur: NEGATIVE
Leukocytes,Ua: NEGATIVE
Nitrite: NEGATIVE
Protein, ur: NEGATIVE
Specific Gravity, Urine: 1.017 (ref 1.001–1.035)

## 2023-02-13 LAB — LIPID PANEL
Cholesterol: 99 mg/dL (ref <200)
LDL Cholesterol (Calc): 33 mg/dL (calc)
Non-HDL Cholesterol (Calc): 47 mg/dL (calc) (ref <130)

## 2023-02-13 LAB — TSH: TSH: 1.4 mIU/L (ref 0.40–4.50)

## 2023-02-13 LAB — INSULIN, RANDOM: Insulin: 9 u[IU]/mL

## 2023-02-13 LAB — CBC WITH DIFFERENTIAL/PLATELET
Absolute Monocytes: 442 cells/uL (ref 200–950)
Hemoglobin: 13.6 g/dL (ref 13.2–17.1)
MCH: 29.7 pg (ref 27.0–33.0)
MCHC: 32.7 g/dL (ref 32.0–36.0)
Neutrophils Relative %: 67.2 %
RBC: 4.58 10*6/uL (ref 4.20–5.80)
RDW: 12.8 % (ref 11.0–15.0)

## 2023-02-13 LAB — PTH, INTACT AND CALCIUM
Calcium: 9 mg/dL (ref 8.6–10.3)
PTH: 20 pg/mL (ref 16–77)

## 2023-02-13 LAB — MICROALBUMIN / CREATININE URINE RATIO: Microalb, Ur: 1.6 mg/dL

## 2023-02-13 LAB — VITAMIN D 25 HYDROXY (VIT D DEFICIENCY, FRACTURES): Vit D, 25-Hydroxy: 73 ng/mL (ref 30–100)

## 2023-02-17 NOTE — Progress Notes (Signed)
Unviewed labs   Please send " Lab Letter "

## 2023-02-22 NOTE — Progress Notes (Signed)
Lmom to have patient log into mychart to review his lab results and told him that if he was unable to log into mychart, to please call the office to get these results as there are changes to his medication. Copy of labs are also being mailed to the patient. - e welch

## 2023-05-01 ENCOUNTER — Other Ambulatory Visit: Payer: Self-pay | Admitting: Nurse Practitioner

## 2023-05-01 ENCOUNTER — Other Ambulatory Visit: Payer: Self-pay | Admitting: Internal Medicine

## 2023-05-01 DIAGNOSIS — I10 Essential (primary) hypertension: Secondary | ICD-10-CM

## 2023-05-01 DIAGNOSIS — E785 Hyperlipidemia, unspecified: Secondary | ICD-10-CM

## 2023-05-18 ENCOUNTER — Ambulatory Visit (INDEPENDENT_AMBULATORY_CARE_PROVIDER_SITE_OTHER): Payer: PPO | Admitting: Nurse Practitioner

## 2023-05-18 ENCOUNTER — Encounter: Payer: Self-pay | Admitting: Nurse Practitioner

## 2023-05-18 VITALS — BP 122/76 | HR 47 | Temp 97.7°F | Ht 68.5 in | Wt 170.2 lb

## 2023-05-18 DIAGNOSIS — N138 Other obstructive and reflux uropathy: Secondary | ICD-10-CM | POA: Diagnosis not present

## 2023-05-18 DIAGNOSIS — E1169 Type 2 diabetes mellitus with other specified complication: Secondary | ICD-10-CM

## 2023-05-18 DIAGNOSIS — Z79899 Other long term (current) drug therapy: Secondary | ICD-10-CM

## 2023-05-18 DIAGNOSIS — I10 Essential (primary) hypertension: Secondary | ICD-10-CM | POA: Diagnosis not present

## 2023-05-18 DIAGNOSIS — E559 Vitamin D deficiency, unspecified: Secondary | ICD-10-CM

## 2023-05-18 DIAGNOSIS — E785 Hyperlipidemia, unspecified: Secondary | ICD-10-CM

## 2023-05-18 DIAGNOSIS — N183 Chronic kidney disease, stage 3 unspecified: Secondary | ICD-10-CM

## 2023-05-18 DIAGNOSIS — N1831 Chronic kidney disease, stage 3a: Secondary | ICD-10-CM

## 2023-05-18 DIAGNOSIS — E663 Overweight: Secondary | ICD-10-CM

## 2023-05-18 DIAGNOSIS — N401 Enlarged prostate with lower urinary tract symptoms: Secondary | ICD-10-CM

## 2023-05-18 DIAGNOSIS — E1122 Type 2 diabetes mellitus with diabetic chronic kidney disease: Secondary | ICD-10-CM

## 2023-05-18 DIAGNOSIS — K2100 Gastro-esophageal reflux disease with esophagitis, without bleeding: Secondary | ICD-10-CM | POA: Diagnosis not present

## 2023-05-18 NOTE — Progress Notes (Signed)
FOLLOW UP  Assessment:   Essential hypertension Above goal in clinic. Continue to take medications as directed. Discussed DASH (Dietary Approaches to Stop Hypertension) DASH diet is lower in sodium than a typical American diet. Cut back on foods that are high in saturated fat, cholesterol, and trans fats. Eat more whole-grain foods, fish, poultry, and nuts Remain active and exercise as tolerated daily.  Monitor BP at home-Call if greater than 130/80.  Check CMP/CBC   Type 2 diabetes mellitus with stage 3 chronic kidney disease, without long-term current use of insulin [facility]) Education: Reviewed 'ABCs' of diabetes management  Discussed goals to be met and/or maintained include A1C (<7) Blood pressure (<130/80) Cholesterol (LDL <70) Continue Eye Exam yearly  Continue Dental Exam Q6 mo Discussed dietary recommendations Discussed Physical Activity recommendations Foot exam UTD Check A1C   CKD stage 2 due to type 2 diabetes mellitus (HCC) Discussed how what you eat and drink can aide in kidney protection. Stay well hydrated. Avoid high salt foods. Avoid NSAIDS. Keep BP and BG well controlled.   Take medications as prescribed. Remain active and exercise as tolerated daily. Maintain weight.  Continue to monitor. Check CMP/GFR/Microablumin   GERD Continue Pepcid PRN. No suspected reflux complications (Barret/stricture). Lifestyle modification:  wt loss, avoid meals 2-3h before bedtime. Consider eliminating food triggers:  chocolate, caffeine, EtOH, acid/spicy food.   Mixed hyperlipidemia associated with T2DM (HCC) Continue Atorvastatin. Discussed lifestyle modifications. Recommended diet heavy in fruits and veggies, omega 3's. Decrease consumption of animal meats, cheeses, and dairy products. Remain active and exercise as tolerated. Continue to monitor. Check lipids/TSH   Overweight (BMI 27.0-27.9) Discussed appropriate BMI Goal of losing 1 lb per month. Diet  modification. Physical activity. Encouraged/praised to build confidence.   Vitamin D deficiency Essentially at recent check;  continue to recommend supplementation for goal of 70-100 Defer vitamin D level  Medication Management All medications discussed and reviewed in full. All questions and concerns regarding medications addressed.    BPH Symptoms controlled  Continue Tamsulosin   Orders Placed This Encounter  Procedures   CBC with Differential/Platelet   COMPLETE METABOLIC PANEL WITH GFR   Lipid panel   Hemoglobin A1c   Notify office for further evaluation and treatment, questions or concerns if any reported s/s fail to improve.   The patient was advised to call back or seek an in-person evaluation if any symptoms worsen or if the condition fails to improve as anticipated.   Further disposition pending results of labs. Discussed med's effects and SE's.    I discussed the assessment and treatment plan with the patient. The patient was provided an opportunity to ask questions and all were answered. The patient agreed with the plan and demonstrated an understanding of the instructions.  Discussed med's effects and SE's. Screening labs and tests as requested with regular follow-up as recommended.  I provided 25 minutes of face-to-face time during this encounter including counseling, chart review, and critical decision making was preformed.  No future appointments.    Subjective:  Edward [name] is a 82 y.o. male who presents for and 3 month follow up and Medicare Wellness- T2DM with CKD, htn/hld, weight, vitamin D def, GERD.   Overall he reports feeling well.  He has not additional concerns or changes to medical needs today.  He saw [facility] Dr. Aileen Pilot on 06/20/22 for skin biopsy right mid medial upper abdomen shave with dysplastic junctional nevus with severe atypia, close to margin.  The lesion was noted to  have a marked degree of melanocytic atypia.   Re-excision was recommended in order to ensure complete removal. Has now completed and continues to follow up as directed.  He has a diagnosis of GERD which is currently well controlled by Protonix 40 mg once a day in AM and Famotidine 40 mg at night.  Now taking PRN.  Tries to avoid triggers.  BMI is Body mass index is 25.5 kg/m., he has been working on diet and exercise. Continues to mow yards. Wt Readings from Last 3 Encounters:  05/18/23 170 lb 3.2 oz (77.2 kg)  02/10/23 173 lb 9.6 oz (78.7 kg)  11/07/22 175 lb (79.4 kg)   Former remote smoker, quit in 1969.   His blood pressure has been controlled at home running 120's/50's, today their BP is BP: 122/76 He has not taken his BP medication this am.   BP Readings from Last 3 Encounters:  05/18/23 122/76  02/10/23 122/60  11/07/22 (!) 158/82    He does workout. He denies chest pain, shortness of breath, dizziness.   He is on cholesterol medication (lipitor 40 mg daily) and denies myalgias. His cholesterol is at goal. The cholesterol last visit was:   Lab Results  Component Value Date   CHOL 99 02/10/2023   HDL 52 02/10/2023   LDLCALC 33 02/10/2023   TRIG 49 02/10/2023   CHOLHDL 1.9 02/10/2023    He has been working on diet and exercise for T2 diabetes with CKD II/III and ED (sildenafil), well controlled by metformin 500 mg TID, and denies increased appetite, nausea, paresthesia of the feet, polydipsia, polyuria, visual disturbances, vomiting and weight loss.  He does check fasting glucose, ranges 80-110  His BS this AM was 49 did have some hand shaking associated ate an oatmeal cookie and piece of candy and feels much better. Last A1C in the office was:  Lab Results  Component Value Date   HGBA1C 6.6 (H) 02/10/2023   He has CKD borderline II/III associated with T2DM monitored at this office, not on ace/arb, due to low BPs:  Lab Results  Component Value Date   GFRNONAA 52 (L) 01/07/2021   Patient is on Vitamin D supplement  and mildly above goal at last check, he is unsure how much taking, "2 caps":    Lab Results  Component Value Date   VD25OH 73 02/10/2023      Medication Review: Current Outpatient Medications on File Prior to Visit  Medication Sig Dispense Refill   APPLE CIDER VINEGAR PO Take 1-2 tablets by mouth daily.     aspirin 81 MG tablet Take 81 mg by mouth 3 (three) times a week.     atorvastatin (LIPITOR) 40 MG tablet TAKE 1 TABLET BY MOUTH IN THE EVENING FOR CHOLESTEROL 270 tablet 0   Blood Glucose Monitoring Suppl DEVI Use device to test blood sugar once daily 1 each 0   Cholecalciferol (VITAMIN D PO) Take 5,000 Units by mouth daily.     CINNAMON PO Take 1-2 tablets by mouth daily.     Cyanocobalamin (VITAMIN B 12 PO) Take 1 tablet by mouth daily.     famotidine (PEPCID) 40 MG tablet TAKE 1 TABLET BY MOUTH IN THE EVENING TO  PREVENT  HEARTBURN/  INDIGESTION 90 tablet 0   fluticasone (FLONASE) 50 MCG/ACT nasal spray 1-2 Sprays in each nostril once daily for allergies and runny nose. 16 g 1   glucose blood (ONETOUCH VERIO) test strip USE TO TEST BLOOD SUGAR ONCE DAILY 100  strip 12   Lancets MISC Test blood sugar once daily 100 each 11   losartan (COZAAR) 50 MG tablet Take 1 tablet by mouth once daily for blood pressure 90 tablet 0   metFORMIN (GLUCOPHAGE) 500 MG tablet Take  1 tablet  3 x /day  with Meals  for Diabetes 270 tablet 3   OMEGA-3 FATTY ACIDS PO Take 700 mg by mouth daily.     pantoprazole (PROTONIX) 40 MG tablet TAKE 1 TABLET BY MOUTH IN THE EVENING TO  PREVENT  HEARTBURN/  INDIGESTION 90 tablet 0   tamsulosin (FLOMAX) 0.4 MG CAPS capsule TAKE 1 CAPSULE BY MOUTH IN THE EVENING FOR  PROSTATE 90 capsule 0   zinc gluconate 50 MG tablet Take 50 mg by mouth daily.     No current facility-administered medications on file prior to visit.    Allergies  Allergen Reactions   Bystolic [Nebivolol Hcl]     Current Problems (verified) Patient Active Problem List   Diagnosis Date Noted    Erectile dysfunction due to type 2 diabetes mellitus (HCC) 04/09/2021   Former smoker 09/02/2018   FHx: heart disease 09/02/2018   CKD stage 3 due to type 2 diabetes mellitus (HCC) 11/02/2017   Type 2 diabetes mellitus with stage 3a chronic kidney disease, without long-term current use of insulin (HCC) 11/01/2014   Overweight (BMI 25.0-29.9) 07/25/2014   Hyperlipidemia associated with type 2 diabetes mellitus (HCC)    Gastroesophageal reflux disease    Essential hypertension    Vitamin D deficiency    BPH with obstruction/lower urinary tract symptoms    SURGICAL HISTORY He  has a past surgical history that includes Cataract extraction w/ intraocular lens implant (2013); Kidney stone surgery; Extracorporeal shock wave lithotripsy (Left, 12/11/2017); Colonoscopy (2013); Polypectomy; and Cataract extraction (Right, 2015). FAMILY HISTORY His family history includes Diabetes in his sister and sister; Heart disease in his mother; Hypertension in his sister and sister. SOCIAL HISTORY He  reports that he quit smoking about 55 years ago. His smoking use included cigarettes. He started smoking about 65 years ago. He has a 5 pack-year smoking history. He has never used smokeless tobacco. He reports that he does not drink alcohol and does not use drugs.   Review of Systems  Constitutional:  Negative for malaise/fatigue and weight loss.  HENT:  Negative for congestion, ear discharge, ear pain, hearing loss, sore throat and tinnitus.   Eyes:  Negative for blurred vision and double vision.  Respiratory:  Negative for cough, sputum production, shortness of breath and wheezing.   Cardiovascular:  Negative for chest pain, palpitations, orthopnea, claudication, leg swelling and PND.  Gastrointestinal:  Negative for abdominal pain, blood in stool, constipation, diarrhea, heartburn, melena, nausea and vomiting.  Genitourinary: Negative.   Musculoskeletal:  Negative for falls, joint pain and myalgias.  Skin:   Negative for rash.  Neurological:  Negative for dizziness, tingling, sensory change, weakness and headaches.  Endo/Heme/Allergies:  Negative for polydipsia.  Psychiatric/Behavioral: Negative.  Negative for depression, memory loss, substance abuse and suicidal ideas. The patient is not nervous/anxious and does not have insomnia.   All other systems reviewed and are negative.    Objective:     Today's Vitals   05/18/23 1056  BP: 122/76  Pulse: (!) 47  Temp: 97.7 F (36.5 C)  SpO2: 98%  Weight: 170 lb 3.2 oz (77.2 kg)  Height: 5' 8.5" (1.74 m)   Body mass index is 25.5 kg/m.  General appearance: alert, no distress,  WD/WN, male HEENT: normocephalic, sclerae anicteric, bilateral external auditory canals clear, pharynx normal Oral cavity: MMM, no lesions Neck: supple, no lymphadenopathy, no thyromegaly, no masses Heart: RRR, normal S1, S2, no murmurs Lungs: CTA bilaterally, no wheezes, rhonchi, or rales Abdomen: +bs, soft, non tender, non distended, no masses, no hepatomegaly, no splenomegaly Musculoskeletal: nontender, no swelling, no obvious deformity Extremities: no edema, no cyanosis, no clubbing Pulses: 2+ symmetric, upper and lower extremities, normal cap refill Neurological: alert, oriented x 3, CN2-12 intact, strength normal upper extremities and lower extremities, sensation normal throughout, DTRs 2+ throughout, no cerebellar signs, gait normal Psychiatric: normal affect, behavior normal, pleasant   Screening Tests Immunization History  Administered Date(s) Administered   DT (Pediatric) 04/08/2014   Influenza, High Dose Seasonal PF 06/24/2014, 07/28/2015, 05/02/2016, 07/31/2017, 05/30/2018, 06/20/2019, 06/08/2020, 07/09/2021, 07/28/2022   Influenza-Unspecified 07/02/2013   PFIZER(Purple Top)SARS-COV-2 Vaccination 10/03/2019, 10/24/2019, 06/17/2020   Pneumococcal Conjugate-13 04/09/2015   Pneumococcal-Unspecified 10/02/2007   Td 10/02/2003   Zoster  Recombinant(Shingrix) 05/28/2020   Zoster, Live 10/01/2006   Health Maintenance  Topic Date Due   Pneumonia Vaccine 51+ Years old (2 of 2 - PPSV23 or PCV20) 04/08/2016   Zoster Vaccines- Shingrix (2 of 2) 07/23/2020   INFLUENZA VACCINE  04/13/2023   COVID-19 Vaccine (4 - 2023-24 season) 05/14/2023   OPHTHALMOLOGY EXAM  07/09/2023   HEMOGLOBIN A1C  08/12/2023   Medicare Annual Wellness (AWV)  11/08/2023   FOOT EXAM  02/09/2024   Diabetic kidney evaluation - eGFR measurement  02/10/2024   Diabetic kidney evaluation - Urine ACR  02/10/2024   DTaP/Tdap/Td (3 - Tdap) 04/08/2024   HPV VACCINES  Aged Out    Patient Care Team: Lucky Cowboy, MD as PCP - General (Internal Medicine) Maris Berger, MD as Consulting Physician (Ophthalmology) Louis Meckel, MD (Inactive) as Consulting Physician (Gastroenterology) Crista Elliot, MD as Consulting Physician (Urology) Lajoyce Corners, [facility] (Inactive) as Pharmacist (Pharmacist)  Surgical: He  has a past surgical history that includes Cataract extraction w/ intraocular lens implant (2013); Kidney stone surgery; Extracorporeal shock wave lithotripsy (Left, 12/11/2017); Colonoscopy (2013); Polypectomy; and Cataract extraction (Right, 2015). Family His family history includes Diabetes in his sister and sister; Heart disease in his mother; Hypertension in his sister and sister. Social history  He reports that he quit smoking about 55 years ago. His smoking use included cigarettes. He started smoking about 65 years ago. He has a 5 pack-year smoking history. He has never used smokeless tobacco. He reports that he does not drink alcohol and does not use drugs.  Adela Glimpse, NP   05/18/2023

## 2023-05-19 LAB — CBC WITH DIFFERENTIAL/PLATELET
Absolute Monocytes: 409 {cells}/uL (ref 200–950)
Basophils Absolute: 19 {cells}/uL (ref 0–200)
Basophils Relative: 0.4 %
Eosinophils Absolute: 52 {cells}/uL (ref 15–500)
Eosinophils Relative: 1.1 %
HCT: 41.7 % (ref 38.5–50.0)
Hemoglobin: 13.5 g/dL (ref 13.2–17.1)
Lymphs Abs: 1222 {cells}/uL (ref 850–3900)
MCH: 29.1 pg (ref 27.0–33.0)
MCHC: 32.4 g/dL (ref 32.0–36.0)
MCV: 89.9 fL (ref 80.0–100.0)
MPV: 12.1 fL (ref 7.5–12.5)
Monocytes Relative: 8.7 %
Neutro Abs: 2999 {cells}/uL (ref 1500–7800)
Neutrophils Relative %: 63.8 %
Platelets: 118 10*3/uL — ABNORMAL LOW (ref 140–400)
RBC: 4.64 10*6/uL (ref 4.20–5.80)
RDW: 12.9 % (ref 11.0–15.0)
Total Lymphocyte: 26 %
WBC: 4.7 10*3/uL (ref 3.8–10.8)

## 2023-05-19 LAB — COMPLETE METABOLIC PANEL WITH GFR
AG Ratio: 1.9 (calc) (ref 1.0–2.5)
ALT: 6 U/L — ABNORMAL LOW (ref 9–46)
AST: 9 U/L — ABNORMAL LOW (ref 10–35)
Albumin: 4.1 g/dL (ref 3.6–5.1)
Alkaline phosphatase (APISO): 68 U/L (ref 35–144)
BUN/Creatinine Ratio: 12 (calc) (ref 6–22)
BUN: 15 mg/dL (ref 7–25)
CO2: 28 mmol/L (ref 20–32)
Calcium: 9.1 mg/dL (ref 8.6–10.3)
Chloride: 106 mmol/L (ref 98–110)
Creat: 1.25 mg/dL — ABNORMAL HIGH (ref 0.70–1.22)
Globulin: 2.2 g/dL (ref 1.9–3.7)
Glucose, Bld: 96 mg/dL (ref 65–99)
Potassium: 4.6 mmol/L (ref 3.5–5.3)
Sodium: 142 mmol/L (ref 135–146)
Total Bilirubin: 0.7 mg/dL (ref 0.2–1.2)
Total Protein: 6.3 g/dL (ref 6.1–8.1)
eGFR: 58 mL/min/{1.73_m2} — ABNORMAL LOW (ref >=60)

## 2023-05-19 LAB — HEMOGLOBIN A1C
Hgb A1c MFr Bld: 6.8 %{Hb} — ABNORMAL HIGH (ref <5.7)
Mean Plasma Glucose: 148 mg/dL
eAG (mmol/L): 8.2 mmol/L

## 2023-05-19 LAB — LIPID PANEL
Cholesterol: 110 mg/dL (ref <200)
HDL: 54 mg/dL (ref >=40)
LDL Cholesterol (Calc): 43 mg/dL
Non-HDL Cholesterol (Calc): 56 mg/dL (ref <130)
Total CHOL/HDL Ratio: 2 (calc) (ref <5.0)
Triglycerides: 46 mg/dL (ref <150)

## 2023-05-22 NOTE — Progress Notes (Signed)
Patient is aware of lab results and instructions and states that he doesn't take any NSAIDS. -e welch

## 2023-05-24 ENCOUNTER — Telehealth: Payer: Self-pay

## 2023-05-24 NOTE — Telephone Encounter (Signed)
LVM

## 2023-05-26 ENCOUNTER — Telehealth: Payer: Self-pay

## 2023-05-26 NOTE — Telephone Encounter (Signed)
Unable to reach the patient, mailed out results.

## 2023-05-26 NOTE — Telephone Encounter (Signed)
-----   Message from [facility] sent at 05/23/2023  2:12 PM EDT ----- Regarding: Labs Kidney function is about the same as 4 months but was better 6 months prior  Stay well hydrated. Avoid high salt foods. Avoid NSAIDS. Keep BP and BG well controlled.   Take medications as prescribed. Remain active and exercise as tolerated daily. Monitor weight  Platelet levesl keep dropping.  Suggest that he take a Folate supplement  as  [place] Methyl Folate  1,000 mcg cap.  He can get on [employer].  Folate is important for platelet production & function. He must get the correct Folic Acid   A1c continues to be elevated.  Continue to focus on limiting intake of sugars, carbohydrates and starches.  Remain active and monitor weight.

## 2023-06-07 ENCOUNTER — Other Ambulatory Visit: Payer: Self-pay | Admitting: Nurse Practitioner

## 2023-06-07 DIAGNOSIS — N138 Other obstructive and reflux uropathy: Secondary | ICD-10-CM

## 2023-06-12 ENCOUNTER — Other Ambulatory Visit: Payer: Self-pay | Admitting: Nurse Practitioner

## 2023-06-12 DIAGNOSIS — K219 Gastro-esophageal reflux disease without esophagitis: Secondary | ICD-10-CM

## 2023-06-27 ENCOUNTER — Ambulatory Visit: Payer: PPO

## 2023-06-29 ENCOUNTER — Ambulatory Visit (INDEPENDENT_AMBULATORY_CARE_PROVIDER_SITE_OTHER): Payer: PPO

## 2023-06-29 VITALS — Temp 97.9°F

## 2023-06-29 DIAGNOSIS — Z23 Encounter for immunization: Secondary | ICD-10-CM

## 2023-07-14 DIAGNOSIS — H02403 Unspecified ptosis of bilateral eyelids: Secondary | ICD-10-CM | POA: Diagnosis not present

## 2023-07-14 DIAGNOSIS — H02105 Unspecified ectropion of left lower eyelid: Secondary | ICD-10-CM | POA: Diagnosis not present

## 2023-07-14 DIAGNOSIS — Z961 Presence of intraocular lens: Secondary | ICD-10-CM | POA: Diagnosis not present

## 2023-07-14 DIAGNOSIS — E119 Type 2 diabetes mellitus without complications: Secondary | ICD-10-CM | POA: Diagnosis not present

## 2023-07-14 DIAGNOSIS — H524 Presbyopia: Secondary | ICD-10-CM | POA: Diagnosis not present

## 2023-07-14 LAB — HM DIABETES EYE EXAM

## 2023-08-02 ENCOUNTER — Encounter: Payer: Self-pay | Admitting: Internal Medicine

## 2023-08-22 ENCOUNTER — Ambulatory Visit (INDEPENDENT_AMBULATORY_CARE_PROVIDER_SITE_OTHER): Payer: PPO | Admitting: Nurse Practitioner

## 2023-08-22 ENCOUNTER — Encounter: Payer: Self-pay | Admitting: Nurse Practitioner

## 2023-08-22 ENCOUNTER — Other Ambulatory Visit: Payer: Self-pay

## 2023-08-22 VITALS — BP 142/90 | HR 59 | Temp 98.0°F | Ht 68.5 in | Wt 172.6 lb

## 2023-08-22 DIAGNOSIS — Z87891 Personal history of nicotine dependence: Secondary | ICD-10-CM

## 2023-08-22 DIAGNOSIS — N183 Chronic kidney disease, stage 3 unspecified: Secondary | ICD-10-CM

## 2023-08-22 DIAGNOSIS — I10 Essential (primary) hypertension: Secondary | ICD-10-CM | POA: Diagnosis not present

## 2023-08-22 DIAGNOSIS — E785 Hyperlipidemia, unspecified: Secondary | ICD-10-CM

## 2023-08-22 DIAGNOSIS — E1122 Type 2 diabetes mellitus with diabetic chronic kidney disease: Secondary | ICD-10-CM | POA: Diagnosis not present

## 2023-08-22 DIAGNOSIS — N138 Other obstructive and reflux uropathy: Secondary | ICD-10-CM

## 2023-08-22 DIAGNOSIS — E1169 Type 2 diabetes mellitus with other specified complication: Secondary | ICD-10-CM

## 2023-08-22 DIAGNOSIS — K2100 Gastro-esophageal reflux disease with esophagitis, without bleeding: Secondary | ICD-10-CM

## 2023-08-22 DIAGNOSIS — E663 Overweight: Secondary | ICD-10-CM | POA: Diagnosis not present

## 2023-08-22 DIAGNOSIS — R062 Wheezing: Secondary | ICD-10-CM

## 2023-08-22 DIAGNOSIS — N1831 Chronic kidney disease, stage 3a: Secondary | ICD-10-CM

## 2023-08-22 DIAGNOSIS — E559 Vitamin D deficiency, unspecified: Secondary | ICD-10-CM | POA: Diagnosis not present

## 2023-08-22 DIAGNOSIS — J069 Acute upper respiratory infection, unspecified: Secondary | ICD-10-CM

## 2023-08-22 DIAGNOSIS — Z79899 Other long term (current) drug therapy: Secondary | ICD-10-CM | POA: Diagnosis not present

## 2023-08-22 DIAGNOSIS — R051 Acute cough: Secondary | ICD-10-CM

## 2023-08-22 DIAGNOSIS — N401 Enlarged prostate with lower urinary tract symptoms: Secondary | ICD-10-CM | POA: Diagnosis not present

## 2023-08-22 MED ORDER — DOXYCYCLINE HYCLATE 100 MG PO TABS
100.0000 mg | ORAL_TABLET | Freq: Two times a day (BID) | ORAL | 0 refills | Status: AC
Start: 2023-08-22 — End: 2023-08-29

## 2023-08-22 MED ORDER — PREDNISONE 10 MG PO TABS
ORAL_TABLET | ORAL | 0 refills | Status: DC
Start: 2023-08-22 — End: 2023-12-08

## 2023-08-22 MED ORDER — PROMETHAZINE-DM 6.25-15 MG/5ML PO SYRP: 2.5000 mL | ORAL_SOLUTION | Freq: Four times a day (QID) | ORAL | 0 refills | Status: DC | PRN

## 2023-08-22 MED ORDER — TAMSULOSIN HCL 0.4 MG PO CAPS
ORAL_CAPSULE | ORAL | 0 refills | Status: DC
Start: 2023-08-22 — End: 2023-12-14

## 2023-08-22 NOTE — Progress Notes (Signed)
FOLLOW UP  Assessment:   Essential hypertension Above goal in clinic. Continue to take medications as directed. Discussed DASH (Dietary Approaches to Stop Hypertension) DASH diet is lower in sodium than a typical American diet. Cut back on foods that are high in saturated fat, cholesterol, and trans fats. Eat more whole-grain foods, fish, poultry, and nuts Remain active and exercise as tolerated daily.  Monitor BP at home-Call if greater than 130/80.  Check CMP/CBC  Type 2 diabetes mellitus with stage 3 chronic kidney disease, without long-term current use of insulin [facility]) Education: Reviewed 'ABCs' of diabetes management  Discussed goals to be met and/or maintained include A1C (<7) Blood pressure (<130/80) Cholesterol (LDL <70) Continue Eye Exam yearly  Continue Dental Exam Q6 mo Discussed dietary recommendations Discussed Physical Activity recommendations Foot exam UTD Check A1C  CKD stage 2 due to type 2 diabetes mellitus (HCC) Discussed how what you eat and drink can aide in kidney protection. Stay well hydrated. Avoid high salt foods. Avoid NSAIDS. Keep BP and BG well controlled.   Take medications as prescribed. Remain active and exercise as tolerated daily. Maintain weight.  Continue to monitor. Check CMP/GFR/Microablumin  GERD Continue Pepcid PRN. No suspected reflux complications (Barret/stricture). Lifestyle modification:  wt loss, avoid meals 2-3h before bedtime. Consider eliminating food triggers:  chocolate, caffeine, EtOH, acid/spicy food.  Mixed hyperlipidemia associated with T2DM (HCC) Continue Atorvastatin. Discussed lifestyle modifications. Recommended diet heavy in fruits and veggies, omega 3's. Decrease consumption of animal meats, cheeses, and dairy products. Remain active and exercise as tolerated. Continue to monitor. Check lipids/TSH  Overweight  Discussed appropriate BMI Diet modification. Physical activity. Encouraged/praised to  build confidence.  Vitamin D Vitamin D deficiency Continue supplement for goal of 60-100 Monitor Vitamin D levels  Medication Management All medications discussed and reviewed in full. All questions and concerns regarding medications addressed.    BPH Symptoms controlled  Continue Tamsulosin  Former smoker Total pack years:  5 Quit 09/1967 No residuals or symptoms noted   Upper respiratory infection/Acute cough//Wheezing Start Doxycycline as directed. Start Promethazine as directed. Start steroid taper as directed. Stay well hydrated to keep mucus thin and productive Report to ER for any increase in difficulty breathing  Orders Placed This Encounter  Procedures   CBC with Differential/Platelet   COMPLETE METABOLIC PANEL WITH GFR   Lipid panel   Hemoglobin A1c   Meds ordered this encounter  Medications   tamsulosin (FLOMAX) 0.4 MG CAPS capsule    Sig: TAKE 1 CAPSULE BY MOUTH IN THE EVENING FOR  PROSTATE    Dispense:  90 capsule    Refill:  0    Order Specific Question:   Supervising Provider    Answer:   Lucky Cowboy [[id number]]   doxycycline (VIBRA-TABS) 100 MG tablet    Sig: Take 1 tablet (100 mg total) by mouth 2 (two) times daily for 7 days.    Dispense:  14 tablet    Refill:  0    Order Specific Question:   Supervising Provider    Answer:   Lucky Cowboy [phone]   promethazine-dextromethorphan (PROMETHAZINE-DM) 6.25-15 MG/5ML syrup    Sig: Take 2.5 mLs by mouth 4 (four) times daily as needed for cough.    Dispense:  240 mL    Refill:  0    Order Specific Question:   Supervising Provider    Answer:   Lucky Cowboy [[id number]]   predniSONE (DELTASONE) 10 MG tablet    Sig: 1 tab 3 x day for  2 days, then 1 tab 2 x day for 2 days, then 1 tab 1 x day for 3 days    Dispense:  13 tablet    Refill:  0    Order Specific Question:   Supervising Provider    Answer:   Lucky Cowboy [phone]   Notify office for further evaluation and treatment, questions or concerns  if any reported s/s fail to improve.   The patient was advised to call back or seek an in-person evaluation if any symptoms worsen or if the condition fails to improve as anticipated.   Further disposition pending results of labs. Discussed med's effects and SE's.    I discussed the assessment and treatment plan with the patient. The patient was provided an opportunity to ask questions and all were answered. The patient agreed with the plan and demonstrated an understanding of the instructions.  Discussed med's effects and SE's. Screening labs and tests as requested with regular follow-up as recommended.  I provided 25 minutes of face-to-face time during this encounter including counseling, chart review, and critical decision making was preformed.  Future Appointments  Date Time Provider Department Center  11/22/2023  3:30 PM Adela Glimpse, NP [facility] None  03/01/2024 11:00 AM Lucky Cowboy, MD [facility] None  06/03/2024  2:30 PM Adela Glimpse, NP [facility] None  09/02/2024  2:30 PM Lucky Cowboy, MD [facility] None      Subjective:  Edward [name] is a 82 y.o. male who presents for and 3 month follow up and Medicare Wellness- T2DM with CKD, htn/hld, weight, vitamin D def, GERD.   Overall he reports feeling well however he has been experiencing a cough with nasal congestion, wheezing, fatigue, DOE x 3 weeks.  Has taken OTC cold and cough medication without improvement.  He is also using Flonase nasal spray.  Has tried a histamine HS without effectiveness.  Associated chills, no fever, N/V.    He saw [facility] Dr. Aileen Pilot on 06/20/22 for skin biopsy right mid medial upper abdomen shave with dysplastic junctional nevus with severe atypia, close to margin.  The lesion was noted to have a marked degree of melanocytic atypia.  Re-excision was recommended in order to ensure complete removal. Has now completed and continues to follow up as directed.  He has a  diagnosis of GERD which is currently well controlled by Protonix 40 mg once a day in AM and Famotidine 40 mg at night.  Now taking PRN.  Tries to avoid triggers.  BMI is Body mass index is 25.86 kg/m., he has been working on diet and exercise. Continues to mow yards. Wt Readings from Last 3 Encounters:  08/22/23 172 lb 9.6 oz (78.3 kg)  05/18/23 170 lb 3.2 oz (77.2 kg)  02/10/23 173 lb 9.6 oz (78.7 kg)   Former remote smoker, quit in 1969.   His blood pressure has been controlled at home running 120's/50's, today their BP is BP: (!) 142/90 He has not taken his BP medication this am.   BP Readings from Last 3 Encounters:  08/22/23 (!) 142/90  05/18/23 122/76  02/10/23 122/60    He does workout. He denies chest pain, shortness of breath, dizziness.   He is on cholesterol medication (lipitor 40 mg daily) and denies myalgias. His cholesterol is at goal. The cholesterol last visit was:   Lab Results  Component Value Date   CHOL 110 05/18/2023   HDL 54 05/18/2023   LDLCALC 43 05/18/2023   TRIG 46 05/18/2023  CHOLHDL 2.0 05/18/2023    He has been working on diet and exercise for T2 diabetes with CKD II/III and ED (sildenafil), well controlled by metformin 500 mg TID, and denies increased appetite, nausea, paresthesia of the feet, polydipsia, polyuria, visual disturbances, vomiting and weight loss.  He does check fasting glucose, ranges 80-110  His BS this AM was 49 did have some hand shaking associated ate an oatmeal cookie and piece of candy and feels much better. Last A1C in the office was:  Lab Results  Component Value Date   HGBA1C 6.8 (H) 05/18/2023   He has CKD borderline II/III associated with T2DM monitored at this office, not on ace/arb, due to low BPs:  Lab Results  Component Value Date   GFRNONAA 52 (L) 01/07/2021   Patient is on Vitamin D supplement and mildly above goal at last check, he is unsure how much taking, "2 caps":    Lab Results  Component Value Date    VD25OH 73 02/10/2023      Medication Review: Current Outpatient Medications on File Prior to Visit  Medication Sig Dispense Refill   APPLE CIDER VINEGAR PO Take 1-2 tablets by mouth daily.     aspirin 81 MG tablet Take 81 mg by mouth 3 (three) times a week.     atorvastatin (LIPITOR) 40 MG tablet TAKE 1 TABLET BY MOUTH IN THE EVENING FOR CHOLESTEROL 270 tablet 0   Blood Glucose Monitoring Suppl DEVI Use device to test blood sugar once daily 1 each 0   Cholecalciferol (VITAMIN D PO) Take 5,000 Units by mouth daily.     CINNAMON PO Take 1-2 tablets by mouth daily.     Cyanocobalamin (VITAMIN B 12 PO) Take 1 tablet by mouth daily.     famotidine (PEPCID) 40 MG tablet TAKE 1 TABLET BY MOUTH IN THE EVENING TO  PREVENT  HEARTBURN/INDIGESTION 90 tablet 0   fluticasone (FLONASE) 50 MCG/ACT nasal spray 1-2 Sprays in each nostril once daily for allergies and runny nose. 16 g 1   glucose blood (ONETOUCH VERIO) test strip USE TO TEST BLOOD SUGAR ONCE DAILY 100 strip 12   Lancets MISC Test blood sugar once daily 100 each 11   losartan (COZAAR) 50 MG tablet Take 1 tablet by mouth once daily for blood pressure 90 tablet 0   metFORMIN (GLUCOPHAGE) 500 MG tablet Take  1 tablet  3 x /day  with Meals  for Diabetes 270 tablet 3   OMEGA-3 FATTY ACIDS PO Take 700 mg by mouth daily.     pantoprazole (PROTONIX) 40 MG tablet TAKE 1 TABLET BY MOUTH IN THE EVENING TO  PREVENT  HEARTBURN/  INDIGESTION 90 tablet 0   tamsulosin (FLOMAX) 0.4 MG CAPS capsule TAKE 1 CAPSULE BY MOUTH IN THE EVENING FOR PROSTATE 90 capsule 0   zinc gluconate 50 MG tablet Take 50 mg by mouth daily.     No current facility-administered medications on file prior to visit.    Allergies  Allergen Reactions   Bystolic [Nebivolol Hcl]     Current Problems (verified) Patient Active Problem List   Diagnosis Date Noted   Erectile dysfunction due to type 2 diabetes mellitus (HCC) 04/09/2021   Former smoker 09/02/2018   FHx: heart disease  09/02/2018   CKD stage 3 due to type 2 diabetes mellitus (HCC) 11/02/2017   Type 2 diabetes mellitus with stage 3a chronic kidney disease, without long-term current use of insulin (HCC) 11/01/2014   Overweight (BMI 25.0-29.9) 07/25/2014  Hyperlipidemia associated with type 2 diabetes mellitus (HCC)    Gastroesophageal reflux disease    Essential hypertension    Vitamin D deficiency    BPH with obstruction/lower urinary tract symptoms    SURGICAL HISTORY He  has a past surgical history that includes Cataract extraction w/ intraocular lens implant (2013); Kidney stone surgery; Extracorporeal shock wave lithotripsy (Left, 12/11/2017); Colonoscopy (2013); Polypectomy; and Cataract extraction (Right, 2015). FAMILY HISTORY His family history includes Diabetes in his sister and sister; Heart disease in his mother; Hypertension in his sister and sister. SOCIAL HISTORY He  reports that he quit smoking about 55 years ago. His smoking use included cigarettes. He started smoking about 65 years ago. He has a 5 pack-year smoking history. He has never used smokeless tobacco. He reports that he does not drink alcohol and does not use drugs.   Review of Systems  Constitutional:  Negative for malaise/fatigue and weight loss.  HENT:  Negative for congestion, ear discharge, ear pain, hearing loss, sore throat and tinnitus.   Eyes:  Negative for blurred vision and double vision.  Respiratory:  Negative for cough, sputum production, shortness of breath and wheezing.   Cardiovascular:  Negative for chest pain, palpitations, orthopnea, claudication, leg swelling and PND.  Gastrointestinal:  Negative for abdominal pain, blood in stool, constipation, diarrhea, heartburn, melena, nausea and vomiting.  Genitourinary: Negative.   Musculoskeletal:  Negative for falls, joint pain and myalgias.  Skin:  Negative for rash.  Neurological:  Negative for dizziness, tingling, sensory change, weakness and headaches.   Endo/Heme/Allergies:  Negative for polydipsia.  Psychiatric/Behavioral: Negative.  Negative for depression, memory loss, substance abuse and suicidal ideas. The patient is not nervous/anxious and does not have insomnia.   All other systems reviewed and are negative.    Objective:     Today's Vitals   08/22/23 1141  BP: (!) 142/90  Pulse: (!) 59  Temp: 98 F (36.7 C)  SpO2: 98%  Weight: 172 lb 9.6 oz (78.3 kg)  Height: 5' 8.5" (1.74 m)   Body mass index is 25.86 kg/m.  General appearance: alert, no distress, WD/WN, male HEENT: normocephalic, sclerae anicteric, bilateral external auditory canals clear, pharynx normal Oral cavity: MMM, no lesions Neck: supple, no lymphadenopathy, no thyromegaly, no masses Heart: RRR, normal S1, S2, no murmurs Lungs: CTA bilaterally, no wheezes, rhonchi, or rales Abdomen: +bs, soft, non tender, non distended, no masses, no hepatomegaly, no splenomegaly Musculoskeletal: nontender, no swelling, no obvious deformity Extremities: no edema, no cyanosis, no clubbing Pulses: 2+ symmetric, upper and lower extremities, normal cap refill Neurological: alert, oriented x 3, CN2-12 intact, strength normal upper extremities and lower extremities, sensation normal throughout, DTRs 2+ throughout, no cerebellar signs, gait normal Psychiatric: normal affect, behavior normal, pleasant   Screening Tests Immunization History  Administered Date(s) Administered   DT (Pediatric) 04/08/2014   Influenza, High Dose Seasonal PF 06/24/2014, 07/28/2015, 05/02/2016, 07/31/2017, 05/30/2018, 06/20/2019, 06/08/2020, 07/09/2021, 07/28/2022, 06/29/2023   Influenza-Unspecified 07/02/2013   PFIZER(Purple Top)SARS-COV-2 Vaccination 10/03/2019, 10/24/2019, 06/17/2020   Pfizer Covid-19 Vaccine Bivalent Booster 82yrs & up 07/30/2022   Pneumococcal Conjugate-13 04/09/2015   Pneumococcal-Unspecified 10/02/2007   Rsv, Bivalent, Protein Subunit Rsvpref,pf Verdis Frederickson) 07/29/2022   Td  10/02/2003   Zoster Recombinant(Shingrix) 05/28/2020   Zoster, Live 10/01/2006   Health Maintenance  Topic Date Due   Pneumonia Vaccine 72+ Years old (2 of 2 - PPSV23 or PCV20) 06/04/2015   Zoster Vaccines- Shingrix (2 of 2) 07/23/2020   COVID-19 Vaccine (5 - 2023-24 season)  05/14/2023   Medicare Annual Wellness (AWV)  11/08/2023   HEMOGLOBIN A1C  11/15/2023   FOOT EXAM  02/09/2024   Diabetic kidney evaluation - Urine ACR  02/10/2024   DTaP/Tdap/Td (3 - Tdap) 04/08/2024   Diabetic kidney evaluation - eGFR measurement  05/17/2024   OPHTHALMOLOGY EXAM  07/13/2024   INFLUENZA VACCINE  Completed   HPV VACCINES  Aged Out    Patient Care Team: Lucky Cowboy, MD as PCP - General (Internal Medicine) Maris Berger, MD as Consulting Physician (Ophthalmology) Louis Meckel, MD (Inactive) as Consulting Physician (Gastroenterology) Crista Elliot, MD as Consulting Physician (Urology) Lajoyce Corners, [facility] (Inactive) as Pharmacist (Pharmacist)  Surgical: He  has a past surgical history that includes Cataract extraction w/ intraocular lens implant (2013); Kidney stone surgery; Extracorporeal shock wave lithotripsy (Left, 12/11/2017); Colonoscopy (2013); Polypectomy; and Cataract extraction (Right, 2015). Family His family history includes Diabetes in his sister and sister; Heart disease in his mother; Hypertension in his sister and sister. Social history  He reports that he quit smoking about 55 years ago. His smoking use included cigarettes. He started smoking about 65 years ago. He has a 5 pack-year smoking history. He has never used smokeless tobacco. He reports that he does not drink alcohol and does not use drugs.  Adela Glimpse, NP   08/22/2023

## 2023-08-22 NOTE — Patient Instructions (Signed)

## 2023-08-23 LAB — CBC WITH DIFFERENTIAL/PLATELET
Absolute Lymphocytes: 1282 {cells}/uL (ref 850–3900)
Absolute Monocytes: 429 {cells}/uL (ref 200–950)
Basophils Absolute: 22 {cells}/uL (ref 0–200)
Basophils Relative: 0.4 %
Eosinophils Absolute: 72 {cells}/uL (ref 15–500)
Eosinophils Relative: 1.3 %
HCT: 43.5 % (ref 38.5–50.0)
Hemoglobin: 14.2 g/dL (ref 13.2–17.1)
MCH: 29.8 pg (ref 27.0–33.0)
MCHC: 32.6 g/dL (ref 32.0–36.0)
MCV: 91.4 fL (ref 80.0–100.0)
MPV: 11.2 fL (ref 7.5–12.5)
Monocytes Relative: 7.8 %
Neutro Abs: 3696 {cells}/uL (ref 1500–7800)
Neutrophils Relative %: 67.2 %
Platelets: 177 10*3/uL (ref 140–400)
RBC: 4.76 10*6/uL (ref 4.20–5.80)
RDW: 12.4 % (ref 11.0–15.0)
Total Lymphocyte: 23.3 %
WBC: 5.5 10*3/uL (ref 3.8–10.8)

## 2023-08-23 LAB — COMPLETE METABOLIC PANEL WITH GFR
AG Ratio: 1.8 (calc) (ref 1.0–2.5)
ALT: 5 U/L — ABNORMAL LOW (ref 9–46)
AST: 9 U/L — ABNORMAL LOW (ref 10–35)
Albumin: 4.1 g/dL (ref 3.6–5.1)
Alkaline phosphatase (APISO): 75 U/L (ref 35–144)
BUN/Creatinine Ratio: 14 (calc) (ref 6–22)
BUN: 19 mg/dL (ref 7–25)
CO2: 28 mmol/L (ref 20–32)
Calcium: 9.4 mg/dL (ref 8.6–10.3)
Chloride: 108 mmol/L (ref 98–110)
Creat: 1.34 mg/dL — ABNORMAL HIGH (ref 0.70–1.22)
Globulin: 2.3 g/dL (ref 1.9–3.7)
Glucose, Bld: 111 mg/dL — ABNORMAL HIGH (ref 65–99)
Potassium: 5.1 mmol/L (ref 3.5–5.3)
Sodium: 143 mmol/L (ref 135–146)
Total Bilirubin: 0.5 mg/dL (ref 0.2–1.2)
Total Protein: 6.4 g/dL (ref 6.1–8.1)
eGFR: 53 mL/min/{1.73_m2} — ABNORMAL LOW (ref >=60)

## 2023-08-23 LAB — LIPID PANEL
Cholesterol: 104 mg/dL (ref <200)
HDL: 44 mg/dL (ref >=40)
LDL Cholesterol (Calc): 47 mg/dL
Non-HDL Cholesterol (Calc): 60 mg/dL (ref <130)
Total CHOL/HDL Ratio: 2.4 (calc) (ref <5.0)
Triglycerides: 47 mg/dL (ref <150)

## 2023-08-23 LAB — HEMOGLOBIN A1C
Hgb A1c MFr Bld: 6.9 %{Hb} — ABNORMAL HIGH (ref <5.7)
Mean Plasma Glucose: 151 mg/dL
eAG (mmol/L): 8.4 mmol/L

## 2023-08-24 ENCOUNTER — Other Ambulatory Visit: Payer: Self-pay | Admitting: Nurse Practitioner

## 2023-08-24 DIAGNOSIS — K219 Gastro-esophageal reflux disease without esophagitis: Secondary | ICD-10-CM

## 2023-08-24 DIAGNOSIS — N183 Chronic kidney disease, stage 3 unspecified: Secondary | ICD-10-CM

## 2023-08-24 DIAGNOSIS — E1122 Type 2 diabetes mellitus with diabetic chronic kidney disease: Secondary | ICD-10-CM

## 2023-11-20 ENCOUNTER — Other Ambulatory Visit: Payer: Self-pay

## 2023-11-20 DIAGNOSIS — I10 Essential (primary) hypertension: Secondary | ICD-10-CM

## 2023-11-20 MED ORDER — LOSARTAN POTASSIUM 50 MG PO TABS: ORAL_TABLET | ORAL | 0 refills | Status: DC

## 2023-11-22 ENCOUNTER — Ambulatory Visit: Payer: PPO | Admitting: Nurse Practitioner

## 2023-12-08 ENCOUNTER — Ambulatory Visit: Payer: PPO | Admitting: Family Medicine

## 2023-12-08 ENCOUNTER — Encounter: Payer: Self-pay | Admitting: Family Medicine

## 2023-12-08 VITALS — BP 130/72 | HR 60 | Temp 97.7°F | Ht 68.5 in | Wt 172.0 lb

## 2023-12-08 DIAGNOSIS — E559 Vitamin D deficiency, unspecified: Secondary | ICD-10-CM | POA: Diagnosis not present

## 2023-12-08 DIAGNOSIS — N1831 Chronic kidney disease, stage 3a: Secondary | ICD-10-CM

## 2023-12-08 DIAGNOSIS — I10 Essential (primary) hypertension: Secondary | ICD-10-CM | POA: Diagnosis not present

## 2023-12-08 DIAGNOSIS — E1122 Type 2 diabetes mellitus with diabetic chronic kidney disease: Secondary | ICD-10-CM | POA: Diagnosis not present

## 2023-12-08 DIAGNOSIS — N401 Enlarged prostate with lower urinary tract symptoms: Secondary | ICD-10-CM

## 2023-12-08 DIAGNOSIS — E785 Hyperlipidemia, unspecified: Secondary | ICD-10-CM

## 2023-12-08 DIAGNOSIS — N183 Chronic kidney disease, stage 3 unspecified: Secondary | ICD-10-CM

## 2023-12-08 DIAGNOSIS — K2100 Gastro-esophageal reflux disease with esophagitis, without bleeding: Secondary | ICD-10-CM

## 2023-12-08 DIAGNOSIS — Z7984 Long term (current) use of oral hypoglycemic drugs: Secondary | ICD-10-CM

## 2023-12-08 DIAGNOSIS — Z87891 Personal history of nicotine dependence: Secondary | ICD-10-CM

## 2023-12-08 DIAGNOSIS — E1169 Type 2 diabetes mellitus with other specified complication: Secondary | ICD-10-CM

## 2023-12-08 DIAGNOSIS — N138 Other obstructive and reflux uropathy: Secondary | ICD-10-CM

## 2023-12-08 NOTE — Progress Notes (Signed)
 New Patient Office Visit  Subjective    Patient ID: Edward [name], male    DOB: 1941-01-28  Age: 83 y.o. MRN: [id number]  CC:  Chief Complaint  Patient presents with   Establish Care    No concerns    HPI Edward [name] presents to establish care Previous PCP- Dr. Oneta Rack   Last CPE 02/10/2023.   Last ov and labs for chronic health in December.   Upcoming eyelid surgery   GERD- avoids certain triggers  Take PPI as needed   Former smoker.   HTN and DM type 2 with CKD.   A1c 6.9% in mid December 2024.   GFR 53 and stable   LDL 47   Married. 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   Outpatient Encounter Medications as of 12/08/2023  Medication Sig   APPLE CIDER VINEGAR PO Take 1-2 tablets by mouth daily.   aspirin 81 MG tablet Take 81 mg by mouth 3 (three) times a week.   atorvastatin (LIPITOR) 40 MG tablet TAKE 1 TABLET BY MOUTH IN THE EVENING FOR CHOLESTEROL   Cholecalciferol (VITAMIN D PO) Take 5,000 Units by mouth daily.   CINNAMON PO Take 1-2 tablets by mouth daily.   Cyanocobalamin (VITAMIN B 12 PO) Take 1 tablet by mouth daily.   famotidine (PEPCID) 40 MG tablet TAKE 1 TABLET BY MOUTH IN THE EVENING TO  PREVENT  HEARTBURN/  INDIGESTION   fluticasone (FLONASE) 50 MCG/ACT nasal spray 1-2 Sprays in each nostril once daily for allergies and runny nose.   losartan (COZAAR) 50 MG tablet Take 1 tablet by mouth once daily for blood pressure   metFORMIN (GLUCOPHAGE) 500 MG tablet TAKE 1 TABLET BY MOUTH THREE TIMES DAILY WITH MEALS FOR DIABETES   OMEGA-3 FATTY ACIDS PO Take 700 mg by mouth daily.   pantoprazole (PROTONIX) 40 MG tablet TAKE 1 TABLET BY MOUTH IN THE EVENING TO  PREVENT  HEARTBURN/INDIGESTION   tamsulosin (FLOMAX) 0.4 MG CAPS capsule TAKE 1 CAPSULE BY MOUTH IN THE EVENING FOR  PROSTATE   zinc gluconate 50 MG tablet Take 50 mg by mouth daily.   glucose blood (ONETOUCH VERIO) test strip USE TO TEST BLOOD SUGAR ONCE DAILY   [DISCONTINUED]  Blood Glucose Monitoring Suppl DEVI Use device to test blood sugar once daily   [DISCONTINUED] Lancets MISC Test blood sugar once daily   [DISCONTINUED] predniSONE (DELTASONE) 10 MG tablet 1 tab 3 x day for 2 days, then 1 tab 2 x day for 2 days, then 1 tab 1 x day for 3 days   [DISCONTINUED] promethazine-dextromethorphan (PROMETHAZINE-DM) 6.25-15 MG/5ML syrup Take 2.5 mLs by mouth 4 (four) times daily as needed for cough.   No facility-administered encounter medications on file as of 12/08/2023.    Past Medical History:  Diagnosis Date   BPH (benign prostatic hyperplasia)    Cataract    surgical removal bilateral   Diabetes mellitus without complication (HCC)    ED (erectile dysfunction)    GERD (gastroesophageal reflux disease)    History of adenomatous polyp of colon 11/02/2017   Tubular adenoma 2013; recommended 5 year follow up   History of kidney stones    Hyperlipidemia    Hypertension    IBS (irritable bowel syndrome)    Kidney stones 02/11/2020   LBBB (left bundle branch block)    Personal history of kidney stones    Pre-diabetes    Prediabetes    Vitamin D deficiency  Past Surgical History:  Procedure Laterality Date   CATARACT EXTRACTION Right 2015   CATARACT EXTRACTION W/ INTRAOCULAR LENS IMPLANT  2013   left   COLONOSCOPY  2013   TA   EXTRACORPOREAL SHOCK WAVE LITHOTRIPSY Left 12/11/2017   Procedure: LEFT EXTRACORPOREAL SHOCK WAVE LITHOTRIPSY (ESWL);  Surgeon: Crista Elliot, MD;  Location: [facility];  Service: Urology;  Laterality: Left;   KIDNEY STONE SURGERY     POLYPECTOMY      Family History  Problem Relation Age of Onset   Diabetes Sister    Hypertension Sister    Heart disease Mother    Diabetes Sister    Hypertension Sister    Colon cancer Neg Hx    Colon polyps Neg Hx    Esophageal cancer Neg Hx    Rectal cancer Neg Hx    Stomach cancer Neg Hx     Social History   Socioeconomic History   Marital status: Married    Spouse name: Not on  file   Number of children: Not on file   Years of education: Not on file   Highest education level: Not on file  Occupational History   Not on file  Tobacco Use   Smoking status: Former    Current packs/day: 0.00    Average packs/day: 0.5 packs/day for 10.0 years (5.0 ttl pk-yrs)    Types: Cigarettes    Start date: 09/12/1957    Quit date: 09/13/1967    Years since quitting: 56.2   Smokeless tobacco: Never  Vaping Use   Vaping status: Never Used  Substance and Sexual Activity   Alcohol use: No   Drug use: No   Sexual activity: Not Currently  Other Topics Concern   Not on file  Social History Narrative   Not on file   Social Drivers of Health   Financial Resource Strain: Not on file  Food Insecurity: Not on file  Transportation Needs: No Transportation Needs (07/14/2021)   PRAPARE - Administrator, Civil Service (Medical): No    Lack of Transportation (Non-Medical): No  Physical Activity: Not on file  Stress: Not on file  Social Connections: Not on file  Intimate Partner Violence: Not on file    Review of Systems  Constitutional:  Negative for chills, fever and malaise/fatigue.  Respiratory:  Negative for shortness of breath.   Cardiovascular:  Negative for chest pain, palpitations and leg swelling.  Gastrointestinal:  Negative for abdominal pain, constipation, diarrhea, nausea and vomiting.  Genitourinary:  Negative for dysuria, frequency and urgency.  Neurological:  Negative for dizziness and focal weakness.  Psychiatric/Behavioral:  Negative for depression. The patient is not nervous/anxious.         Objective    BP 130/72 (BP Location: Left Arm, Patient Position: Sitting)   Pulse 60   Temp 97.7 F (36.5 C) (Temporal)   Ht 5' 8.5" (1.74 m)   Wt 172 lb (78 kg)   SpO2 96%   BMI 25.77 kg/m   Physical Exam Constitutional:      General: He is not in acute distress.    Appearance: He is not ill-appearing.  Eyes:     Extraocular Movements:  Extraocular movements intact.     Conjunctiva/sclera: Conjunctivae normal.  Cardiovascular:     Rate and Rhythm: Normal rate and regular rhythm.  Pulmonary:     Effort: Pulmonary effort is normal.     Breath sounds: Normal breath sounds.  Musculoskeletal:     Cervical  back: Normal range of motion and neck supple.     Right lower leg: No edema.     Left lower leg: No edema.  Skin:    General: Skin is warm and dry.  Neurological:     General: No focal deficit present.     Mental Status: He is alert and oriented to person, place, and time.     Motor: No weakness.     Coordination: Coordination normal.     Gait: Gait normal.  Psychiatric:        Mood and Affect: Mood normal.        Behavior: Behavior normal.        Thought Content: Thought content normal.         Assessment & Plan:   Problem List Items Addressed This Visit     BPH with obstruction/lower urinary tract symptoms   Stable.       CKD stage 3 due to type 2 diabetes mellitus (HCC)   Stable in December. Monitor.       Essential hypertension - Primary   Controlled. Continue current medications and low sodium diet.       Former smoker   Gastroesophageal reflux disease   Controlled       Hyperlipidemia associated with type 2 diabetes mellitus (HCC)   LDL in goal.       Type 2 diabetes mellitus with stage 3a chronic kidney disease, without long-term current use of insulin (HCC)   A1c in December 6.9%. Continue current therapies. Follow up in June for fasting CPE and DM follow up      Vitamin D deficiency   Check vitamin D level and replace as warranted        Return in about 3 months (around 03/09/2024).   Hetty Blend, NP-C

## 2023-12-08 NOTE — Patient Instructions (Signed)
 Thank you for trusting [place] with your healthcare.   I will see you back in June for your fasting physical exam.   Let us know if you need anything sooner.

## 2023-12-11 NOTE — Assessment & Plan Note (Signed)
 Stable

## 2023-12-11 NOTE — Assessment & Plan Note (Signed)
 Stable in December. Monitor.

## 2023-12-11 NOTE — Assessment & Plan Note (Signed)
 A1c in December 6.9%. Continue current therapies. Follow up in June for fasting CPE and DM follow up

## 2023-12-11 NOTE — Assessment & Plan Note (Signed)
 Check vitamin D level and replace as warranted

## 2023-12-11 NOTE — Assessment & Plan Note (Signed)
 LDL in goal.

## 2023-12-11 NOTE — Assessment & Plan Note (Signed)
Controlled. Continue current medications and low sodium diet.

## 2023-12-11 NOTE — Assessment & Plan Note (Signed)
 Controlled.

## 2023-12-14 ENCOUNTER — Telehealth: Payer: Self-pay | Admitting: Family Medicine

## 2023-12-14 DIAGNOSIS — K219 Gastro-esophageal reflux disease without esophagitis: Secondary | ICD-10-CM

## 2023-12-14 DIAGNOSIS — N138 Other obstructive and reflux uropathy: Secondary | ICD-10-CM

## 2023-12-14 NOTE — Telephone Encounter (Signed)
 Copied from CRM [id number]. Topic: Clinical - Medication Refill >> Dec 14, 2023 10:16 AM Alcus Dad wrote: Most Recent Primary Care Visit:  Provider: Avanell Shackleton  Department: [facility]  Visit Type: NEW PT - OFFICE VISIT  Date: 12/08/2023  Medication: tamsulosin (FLOMAX) 0.4 MG CAPS capsule famotidine (PEPCID) 40 MG tablet pantoprazole (PROTONIX) 40 MG tablet Has the patient contacted their pharmacy? Yes (Agent: If no, request that the patient contact the pharmacy for the refill. If patient does not wish to contact the pharmacy document the reason why and proceed with request.) (Agent: If yes, when and what did the pharmacy advise?)  Is this the correct pharmacy for this prescription? Yes If no, delete pharmacy and type the correct one.  This is the patient's preferred pharmacy:  [facility] [address] [place] - [address] [place] [address] Phone: [phone] Fax: [phone]  Has the prescription been filled recently? No  Is the patient out of the medication? No  Has the patient been seen for an appointment in the last year OR does the patient have an upcoming appointment? Yes  Can we respond through MyChart? Yes  Agent: Please be advised that Rx refills may take up to 3 business days. We ask that you follow-up with your pharmacy.

## 2023-12-18 MED ORDER — TAMSULOSIN HCL 0.4 MG PO CAPS: ORAL_CAPSULE | ORAL | 0 refills | Status: DC

## 2023-12-18 MED ORDER — FAMOTIDINE 40 MG PO TABS: ORAL_TABLET | ORAL | 0 refills | Status: DC

## 2023-12-18 MED ORDER — PANTOPRAZOLE SODIUM 40 MG PO TBEC: DELAYED_RELEASE_TABLET | ORAL | 0 refills | Status: DC

## 2023-12-18 NOTE — Telephone Encounter (Signed)
 Copied from CRM [id number]. Topic: Clinical - Prescription Issue >> Dec 18, 2023  9:02 AM Efraim Kaufmann C wrote: Reason for CRM: patient made request on 4/3 for famotidine (PEPCID) 40 MG tablet, tamsulosin (FLOMAX) 0.4 MG CAPS capsule, pantoprazole (PROTONIX) 40 MG tablet.  Patient just started seeing Hetty Blend after seeing Dr. Oneta Rack and is in need of prescriptions. Please advise. Thank you

## 2023-12-18 NOTE — Telephone Encounter (Signed)
 Original request was never routed to me so we were unaware of request. Now that it has been brought to my attention the rx has been sent.

## 2024-01-09 ENCOUNTER — Ambulatory Visit (INDEPENDENT_AMBULATORY_CARE_PROVIDER_SITE_OTHER)

## 2024-01-09 VITALS — BP 138/80 | HR 66 | Ht 65.0 in | Wt 170.6 lb

## 2024-01-09 DIAGNOSIS — Z0000 Encounter for general adult medical examination without abnormal findings: Secondary | ICD-10-CM | POA: Diagnosis not present

## 2024-01-09 NOTE — Patient Instructions (Addendum)
 Mr. Edward [name] , Thank you for taking time to come for your Medicare Wellness Visit. I appreciate your ongoing commitment to your health goals. Please review the following plan we discussed and let me know if I can assist you in the future.   Referrals/Orders/Follow-Ups/Clinician Recommendations: Aim for 30 minutes of exercise or brisk walking, 6-8 glasses of water, and 5 servings of fruits and vegetables each day. Educated and advised on getting the Tdap (Tetenus), Pneumonia, 2nd Shingles, and COVID vaccines in 2025.  This is a list of the screening recommended for you and due dates:  Health Maintenance  Topic Date Due   Pneumonia Vaccine (2 of 2 - PPSV23) 06/04/2015   Zoster (Shingles) Vaccine (2 of 2) 07/23/2020   COVID-19 Vaccine (5 - 2024-25 season) 05/14/2023   Complete foot exam   02/09/2024   Yearly kidney health urinalysis for diabetes  02/10/2024   Hemoglobin A1C  02/20/2024   DTaP/Tdap/Td vaccine (3 - Tdap) 04/08/2024   Flu Shot  04/12/2024   Eye exam for diabetics  07/13/2024   Yearly kidney function blood test for diabetes  08/21/2024   Medicare Annual Wellness Visit  01/08/2025   HPV Vaccine  Aged Out   Meningitis B Vaccine  Aged Out    Advanced directives: (Declined) Advance directive discussed with you today. Even though you declined this today, please call our office should you change your mind, and we can give you the proper paperwork for you to fill out.  Next Medicare Annual Wellness Visit scheduled for next year: Yes

## 2024-01-09 NOTE — Progress Notes (Signed)
 Subjective:   Edward [name] is a 83 y.o. who presents for a Medicare Wellness preventive visit.  Visit Complete: In person  Persons Participating in Visit: Patient.  AWV Questionnaire: No: Patient Medicare AWV questionnaire was not completed prior to this visit.  Cardiac Risk Factors include: advanced age (>48men, >50 women);diabetes mellitus;dyslipidemia;hypertension;male gender     Objective:    Today's Vitals   01/09/24 1520  BP: 138/80  Pulse: 66  SpO2: 95%  Weight: 170 lb 9.6 oz (77.4 kg)  Height: 5\' 5"  (1.651 m)   Body mass index is 28.39 kg/m.     01/09/2024    3:18 PM 11/07/2022   12:19 PM 04/25/2022   11:56 AM 04/08/2021   11:47 AM 02/11/2020    3:12 PM 12/28/2018    9:55 AM 12/11/2017    4:49 PM  Advanced Directives  Does Patient Have a Medical Advance Directive? No No Yes Yes Yes Yes Yes  Type of Advance Directive   Living will Healthcare Power of [place];Living will Healthcare Power of [place];Living will  Healthcare Power of [place];Living will  Does patient want to make changes to medical advance directive?   No - Patient declined No - Patient declined No - Patient declined No - Patient declined No - Patient declined  Copy of Healthcare Power of Attorney in Chart?    No - copy requested No - copy requested  No - copy requested  Would patient like information on creating a medical advance directive? No - Patient declined          Current Medications (verified) Outpatient Encounter Medications as of 01/09/2024  Medication Sig   APPLE CIDER VINEGAR PO Take 1-2 tablets by mouth daily.   aspirin 81 MG tablet Take 81 mg by mouth 3 (three) times a week.   atorvastatin  (LIPITOR) 40 MG tablet TAKE 1 TABLET BY MOUTH IN THE EVENING FOR CHOLESTEROL   Cholecalciferol (VITAMIN D  PO) Take 5,000 Units by mouth daily.   CINNAMON PO Take 1-2 tablets by mouth daily.   Cyanocobalamin (VITAMIN B 12 PO) Take 1 tablet by mouth daily.   famotidine  (PEPCID ) 40 MG tablet TAKE  1 TABLET BY MOUTH IN THE EVENING TO  PREVENT  HEARTBURN/  INDIGESTION   fluticasone  (FLONASE ) 50 MCG/ACT nasal spray 1-2 Sprays in each nostril once daily for allergies and runny nose.   glucose blood (ONETOUCH VERIO) test strip USE TO TEST BLOOD SUGAR ONCE DAILY   losartan  (COZAAR ) 50 MG tablet Take 1 tablet by mouth once daily for blood pressure   metFORMIN  (GLUCOPHAGE ) 500 MG tablet TAKE 1 TABLET BY MOUTH THREE TIMES DAILY WITH MEALS FOR DIABETES   OMEGA-3 FATTY ACIDS PO Take 700 mg by mouth daily.   pantoprazole  (PROTONIX ) 40 MG tablet TAKE 1 TABLET BY MOUTH IN THE EVENING TO  PREVENT  HEARTBURN/INDIGESTION   tamsulosin  (FLOMAX ) 0.4 MG CAPS capsule TAKE 1 CAPSULE BY MOUTH IN THE EVENING FOR  PROSTATE   zinc gluconate 50 MG tablet Take 50 mg by mouth daily.   No facility-administered encounter medications on file as of 01/09/2024.    Allergies (verified) Bystolic [nebivolol hcl]   History: Past Medical History:  Diagnosis Date   BPH (benign prostatic hyperplasia)    Cataract    surgical removal bilateral   Diabetes mellitus without complication [facility])    ED (erectile dysfunction)    GERD (gastroesophageal reflux disease)    History of adenomatous polyp of colon 11/02/2017   Tubular adenoma 2013; recommended  5 year follow up   History of kidney stones    Hyperlipidemia    Hypertension    IBS (irritable bowel syndrome)    Kidney stones 02/11/2020   LBBB (left bundle branch block)    Personal history of kidney stones    Pre-diabetes    Prediabetes    Vitamin D  deficiency    Past Surgical History:  Procedure Laterality Date   CATARACT EXTRACTION Right 2015   CATARACT EXTRACTION W/ INTRAOCULAR LENS IMPLANT  2013   left   COLONOSCOPY  2013   TA   EXTRACORPOREAL SHOCK WAVE LITHOTRIPSY Left 12/11/2017   Procedure: LEFT EXTRACORPOREAL SHOCK WAVE LITHOTRIPSY (ESWL);  Surgeon: Samson Croak, MD;  Location: [facility];  Service: Urology;  Laterality: Left;   KIDNEY STONE SURGERY      POLYPECTOMY     Family History  Problem Relation Age of Onset   Diabetes Sister    Hypertension Sister    Heart disease Mother    Diabetes Sister    Hypertension Sister    Colon cancer Neg Hx    Colon polyps Neg Hx    Esophageal cancer Neg Hx    Rectal cancer Neg Hx    Stomach cancer Neg Hx    Social History   Socioeconomic History   Marital status: Married    Spouse name: Not on file   Number of children: Not on file   Years of education: Not on file   Highest education level: Not on file  Occupational History   Not on file  Tobacco Use   Smoking status: Former    Current packs/day: 0.00    Average packs/day: 0.5 packs/day for 10.0 years (5.0 ttl pk-yrs)    Types: Cigarettes    Start date: 09/12/1957    Quit date: 09/13/1967    Years since quitting: 56.3    Passive exposure: Past   Smokeless tobacco: Never  Vaping Use   Vaping status: Never Used  Substance and Sexual Activity   Alcohol use: No   Drug use: No   Sexual activity: Not Currently  Other Topics Concern   Not on file  Social History Narrative   Married   Social Drivers of Health   Financial Resource Strain: Low Risk  (01/09/2024)   Overall Financial Resource Strain (CARDIA)    Difficulty of Paying Living Expenses: Not hard at all  Food Insecurity: No Food Insecurity (01/09/2024)   Hunger Vital Sign    Worried About Running Out of Food in the Last Year: Never true    Ran Out of Food in the Last Year: Never true  Transportation Needs: No Transportation Needs (01/09/2024)   PRAPARE - Administrator, Civil Service (Medical): No    Lack of Transportation (Non-Medical): No  Physical Activity: Sufficiently Active (01/09/2024)   Exercise Vital Sign    Days of Exercise per Week: 4 days    Minutes of Exercise per Session: 60 min  Stress: No Stress Concern Present (01/09/2024)   [employer] of Occupational Health - Occupational Stress Questionnaire    Feeling of Stress : Not at all  Social  Connections: Socially Integrated (01/09/2024)   Social Connection and Isolation Panel [NHANES]    Frequency of Communication with Friends and Family: More than three times a week    Frequency of Social Gatherings with Friends and Family: More than three times a week    Attends Religious Services: More than 4 times per year  Active Member of Clubs or Organizations: Yes    Attends Engineer, structural: More than 4 times per year    Marital Status: Married    Tobacco Counseling Counseling given: No    Clinical Intake:  Pre-visit preparation completed: Yes  Pain : No/denies pain     BMI - recorded: 28.39 Nutritional Status: BMI 25 -29 Overweight Nutritional Risks: None Diabetes: Yes CBG done?: No Did pt. bring in CBG monitor from home?: No  Lab Results  Component Value Date   HGBA1C 6.9 (H) 08/22/2023   HGBA1C 6.8 (H) 05/18/2023   HGBA1C 6.6 (H) 02/10/2023     How often do you need to have someone help you when you read instructions, pamphlets, or other written materials from your doctor or pharmacy?: 1 - Never  Interpreter Needed?: No  Information entered by :: Kandy Orris, CMA   Activities of Daily Living     01/09/2024    3:22 PM 02/09/2023   11:46 PM  In your present state of health, do you have any difficulty performing the following activities:  Hearing? 0 0  Vision? 0 0  Difficulty concentrating or making decisions? 0 0  Walking or climbing stairs? 0 0  Dressing or bathing? 0 0  Doing errands, shopping? 0 0  Preparing Food and eating ? N   Using the Toilet? N   In the past six months, have you accidently leaked urine? N   Do you have problems with loss of bowel control? N   Managing your Medications? N   Managing your Finances? N   Housekeeping or managing your Housekeeping? N     Patient Care Team: Abram Abraham, NP-C as PCP - General (Family Medicine) Dema Filler, MD as Consulting Physician (Ophthalmology) Claudette Cue,  MD (Inactive) as Consulting Physician (Gastroenterology) Samson Croak, MD as Consulting Physician (Urology) Alonza Arthurs, [facility] (Inactive) as Pharmacist (Pharmacist)  Indicate any recent Medical Services you may have received from other than [facility] providers in the past year (date may be approximate).     Assessment:   This is a routine wellness examination for [place].  Hearing/Vision screen Hearing Screening - Comments:: Denies hearing difficulties   Vision Screening - Comments:: Wears rx glasses - up to date with routine eye exams with Dr Dema Filler   Goals Addressed               This Visit's Progress     Patient Stated (pt-stated)        Patient stated that he plans to stay active - plays volleyball       Depression Screen     01/09/2024    3:27 PM 12/08/2023    1:02 PM 02/09/2023   11:46 PM 11/07/2022   12:20 PM 04/25/2022   11:57 AM 01/10/2022   11:38 PM 04/08/2021   11:50 AM  PHQ 2/9 Scores  PHQ - 2 Score 0 0 0 0 0 0 0  PHQ- 9 Score 0          Fall Risk     01/09/2024    3:23 PM 12/08/2023    1:02 PM 02/09/2023   11:46 PM 11/07/2022   12:20 PM 04/25/2022   11:57 AM  Fall Risk   Falls in the past year? 0 0 0 0   Number falls in past yr: 0 0   0  Injury with Fall? 0 0   0  Risk for fall due to : No Fall  Risks No Fall Risks     Follow up Falls prevention discussed;Falls evaluation completed Falls evaluation completed       MEDICARE RISK AT HOME:  Medicare Risk at Home Any stairs in or around the home?: No If so, are there any without handrails?: No Home free of loose throw rugs in walkways, pet beds, electrical cords, etc?: Yes Adequate lighting in your home to reduce risk of falls?: Yes Life alert?: No Use of a cane, walker or w/c?: No Grab bars in the bathroom?: Yes Shower chair or bench in shower?: No Elevated toilet seat or a handicapped toilet?: Yes  TIMED UP AND GO:  Was the test performed?  No  Cognitive Function: 6CIT completed         01/09/2024    3:27 PM  6CIT Screen  What Year? 0 points  What month? 0 points  What time? 0 points  Count back from 20 0 points  Months in reverse 0 points  Repeat phrase 2 points  Total Score 2 points    Immunizations Immunization History  Administered Date(s) Administered   DT (Pediatric) 04/08/2014   Influenza, High Dose Seasonal PF 06/24/2014, 07/28/2015, 05/02/2016, 07/31/2017, 05/30/2018, 06/20/2019, 06/08/2020, 07/09/2021, 07/28/2022, 06/29/2023   Influenza-Unspecified 07/02/2013   PFIZER(Purple Top)SARS-COV-2 Vaccination 10/03/2019, 10/24/2019, 06/17/2020   Pfizer Covid-19 Vaccine Bivalent Booster 33yrs & up 07/30/2022   Pneumococcal Conjugate-13 04/09/2015   Pneumococcal-Unspecified 10/02/2007   Rsv, Bivalent, Protein Subunit Rsvpref,pf Pattricia Bores) 07/29/2022   Td 10/02/2003   Zoster Recombinant(Shingrix) 05/28/2020   Zoster, Live 10/01/2006    Screening Tests Health Maintenance  Topic Date Due   Pneumonia Vaccine 27+ Years old (2 of 2 - PPSV23) 06/04/2015   Zoster Vaccines- Shingrix (2 of 2) 07/23/2020   COVID-19 Vaccine (5 - 2024-25 season) 05/14/2023   FOOT EXAM  02/09/2024   Diabetic kidney evaluation - Urine ACR  02/10/2024   HEMOGLOBIN A1C  02/20/2024   DTaP/Tdap/Td (3 - Tdap) 04/08/2024   INFLUENZA VACCINE  04/12/2024   OPHTHALMOLOGY EXAM  07/13/2024   Diabetic kidney evaluation - eGFR measurement  08/21/2024   Medicare Annual Wellness (AWV)  01/08/2025   HPV VACCINES  Aged Out   Meningococcal B Vaccine  Aged Out    Health Maintenance  Health Maintenance Due  Topic Date Due   Pneumonia Vaccine 55+ Years old (2 of 2 - PPSV23) 06/04/2015   Zoster Vaccines- Shingrix (2 of 2) 07/23/2020   COVID-19 Vaccine (5 - 2024-25 season) 05/14/2023   Health Maintenance Items Addressed: 01/09/2024   Additional Screening:  Vision Screening: Recommended annual ophthalmology exams for early detection of glaucoma and other disorders of the eye.  Dental Screening:  Recommended annual dental exams for proper oral hygiene  Community Resource Referral / Chronic Care Management: CRR required this visit?  No   CCM required this visit?  No     Plan:     I have personally reviewed and noted the following in the patient's chart:   Medical and social history Use of alcohol, tobacco or illicit drugs  Current medications and supplements including opioid prescriptions. Patient is not currently taking opioid prescriptions. Functional ability and status Nutritional status Physical activity Advanced directives List of other physicians Hospitalizations, surgeries, and ER visits in previous 12 months Vitals Screenings to include cognitive, depression, and falls Referrals and appointments  In addition, I have reviewed and discussed with patient certain preventive protocols, quality metrics, and best practice recommendations. A written personalized care plan for preventive services as well  as general preventive health recommendations were provided to patient.     Patria Bookbinder, CMA   01/09/2024   After Visit Summary: (In Person-Printed) AVS printed and given to the patient  Notes: Nothing significant to report at this time.

## 2024-02-11 ENCOUNTER — Other Ambulatory Visit: Payer: Self-pay | Admitting: Family Medicine

## 2024-02-11 DIAGNOSIS — K219 Gastro-esophageal reflux disease without esophagitis: Secondary | ICD-10-CM

## 2024-02-16 ENCOUNTER — Other Ambulatory Visit: Payer: Self-pay | Admitting: Family

## 2024-02-16 DIAGNOSIS — I10 Essential (primary) hypertension: Secondary | ICD-10-CM

## 2024-03-01 ENCOUNTER — Encounter: Payer: PPO | Admitting: Internal Medicine

## 2024-03-09 ENCOUNTER — Other Ambulatory Visit: Payer: Self-pay | Admitting: Family Medicine

## 2024-03-09 DIAGNOSIS — N138 Other obstructive and reflux uropathy: Secondary | ICD-10-CM

## 2024-03-12 ENCOUNTER — Encounter: Payer: Self-pay | Admitting: Family Medicine

## 2024-03-12 ENCOUNTER — Ambulatory Visit (INDEPENDENT_AMBULATORY_CARE_PROVIDER_SITE_OTHER): Admitting: Family Medicine

## 2024-03-12 ENCOUNTER — Ambulatory Visit: Payer: Self-pay | Admitting: Family Medicine

## 2024-03-12 VITALS — BP 118/68 | HR 64 | Temp 97.8°F | Ht 65.0 in | Wt 170.0 lb

## 2024-03-12 DIAGNOSIS — E785 Hyperlipidemia, unspecified: Secondary | ICD-10-CM

## 2024-03-12 DIAGNOSIS — E1169 Type 2 diabetes mellitus with other specified complication: Secondary | ICD-10-CM | POA: Diagnosis not present

## 2024-03-12 DIAGNOSIS — E559 Vitamin D deficiency, unspecified: Secondary | ICD-10-CM | POA: Diagnosis not present

## 2024-03-12 DIAGNOSIS — N401 Enlarged prostate with lower urinary tract symptoms: Secondary | ICD-10-CM | POA: Diagnosis not present

## 2024-03-12 DIAGNOSIS — N138 Other obstructive and reflux uropathy: Secondary | ICD-10-CM

## 2024-03-12 DIAGNOSIS — K219 Gastro-esophageal reflux disease without esophagitis: Secondary | ICD-10-CM | POA: Diagnosis not present

## 2024-03-12 DIAGNOSIS — Z0000 Encounter for general adult medical examination without abnormal findings: Secondary | ICD-10-CM | POA: Diagnosis not present

## 2024-03-12 DIAGNOSIS — I10 Essential (primary) hypertension: Secondary | ICD-10-CM

## 2024-03-12 DIAGNOSIS — Z23 Encounter for immunization: Secondary | ICD-10-CM | POA: Diagnosis not present

## 2024-03-12 DIAGNOSIS — E1122 Type 2 diabetes mellitus with diabetic chronic kidney disease: Secondary | ICD-10-CM

## 2024-03-12 DIAGNOSIS — Z0001 Encounter for general adult medical examination with abnormal findings: Secondary | ICD-10-CM | POA: Insufficient documentation

## 2024-03-12 DIAGNOSIS — N183 Chronic kidney disease, stage 3 unspecified: Secondary | ICD-10-CM | POA: Diagnosis not present

## 2024-03-12 LAB — CBC WITH DIFFERENTIAL/PLATELET
Basophils Absolute: 0 10*3/uL (ref 0.0–0.1)
Basophils Relative: 0.4 % (ref 0.0–3.0)
Eosinophils Absolute: 0.1 10*3/uL (ref 0.0–0.7)
Eosinophils Relative: 1.1 % (ref 0.0–5.0)
HCT: 43.3 % (ref 39.0–52.0)
Hemoglobin: 14 g/dL (ref 13.0–17.0)
Lymphocytes Relative: 20.6 % (ref 12.0–46.0)
Lymphs Abs: 1 10*3/uL (ref 0.7–4.0)
MCHC: 32.4 g/dL (ref 30.0–36.0)
MCV: 89.2 fl (ref 78.0–100.0)
Monocytes Absolute: 0.4 10*3/uL (ref 0.1–1.0)
Monocytes Relative: 8.8 % (ref 3.0–12.0)
Neutro Abs: 3.5 10*3/uL (ref 1.4–7.7)
Neutrophils Relative %: 69.1 % (ref 43.0–77.0)
Platelets: 150 10*3/uL (ref 150.0–400.0)
RBC: 4.85 Mil/uL (ref 4.22–5.81)
RDW: 13.9 % (ref 11.5–15.5)
WBC: 5.1 10*3/uL (ref 4.0–10.5)

## 2024-03-12 LAB — COMPREHENSIVE METABOLIC PANEL WITH GFR
ALT: 9 U/L (ref 0–53)
AST: 10 U/L (ref 0–37)
Albumin: 4.2 g/dL (ref 3.5–5.2)
Alkaline Phosphatase: 67 U/L (ref 39–117)
BUN: 17 mg/dL (ref 6–23)
CO2: 28 meq/L (ref 19–32)
Calcium: 8.9 mg/dL (ref 8.4–10.5)
Chloride: 106 meq/L (ref 96–112)
Creatinine, Ser: 1.23 mg/dL (ref 0.40–1.50)
GFR: 54.86 mL/min — ABNORMAL LOW (ref >60.00)
Glucose, Bld: 119 mg/dL — ABNORMAL HIGH (ref 70–99)
Potassium: 3.8 meq/L (ref 3.5–5.1)
Sodium: 142 meq/L (ref 135–145)
Total Bilirubin: 0.8 mg/dL (ref 0.2–1.2)
Total Protein: 6.3 g/dL (ref 6.0–8.3)

## 2024-03-12 LAB — HEMOGLOBIN A1C: Hgb A1c MFr Bld: 7 % — ABNORMAL HIGH (ref 4.6–6.5)

## 2024-03-12 LAB — MICROALBUMIN / CREATININE URINE RATIO
Creatinine,U: 30.5 mg/dL
Microalb Creat Ratio: 31.9 mg/g — ABNORMAL HIGH (ref 0.0–30.0)
Microalb, Ur: 1 mg/dL (ref 0.0–1.9)

## 2024-03-12 LAB — T4, FREE: Free T4: 1.05 ng/dL (ref 0.60–1.60)

## 2024-03-12 LAB — VITAMIN D 25 HYDROXY (VIT D DEFICIENCY, FRACTURES): VITD: 73.99 ng/mL (ref 30.00–100.00)

## 2024-03-12 LAB — PSA, MEDICARE: PSA: 4.08 ng/mL — ABNORMAL HIGH (ref 0.10–4.00)

## 2024-03-12 LAB — TSH: TSH: 1.84 u[IU]/mL (ref 0.35–5.50)

## 2024-03-12 MED ORDER — METFORMIN HCL 500 MG PO TABS: ORAL_TABLET | ORAL | 0 refills | Status: DC

## 2024-03-12 MED ORDER — EMPAGLIFLOZIN 10 MG PO TABS: 10.0000 mg | ORAL_TABLET | Freq: Every day | ORAL | 0 refills | Status: DC

## 2024-03-12 NOTE — Assessment & Plan Note (Signed)
 A1c in December 6.9%. Continue current therapies.  Monitor renal function.  Get regular eye exams.

## 2024-03-12 NOTE — Assessment & Plan Note (Signed)
 LDL in goal.

## 2024-03-12 NOTE — Assessment & Plan Note (Signed)
 Controlled on current medications

## 2024-03-12 NOTE — Patient Instructions (Signed)
 Please go downstairs for labs and a urine test before you leave.   We will be in touch with your results.

## 2024-03-12 NOTE — Assessment & Plan Note (Signed)
Controlled. Continue current medications and low sodium diet.

## 2024-03-12 NOTE — Progress Notes (Signed)
 Please call him and schedule him to follow up with me in 3 months since I sent in a new medication. Please see if he has any questions.  Your diabetes is not as well controlled. Your A1c is 7.0% now. I sent a new medication to your pharmacy to help lower your blood sugars and help to improve your kidney function. Please drink water and stay hydrated. Cut back on sugar, sweets, and carbohydrates.  Your PSA (prostate blood test) is slightly higher than last year. We will continue to monitor this. Follow up with me in 3 months or sooner if you have any concerns.

## 2024-03-12 NOTE — Assessment & Plan Note (Signed)
 Check vitamin D level and replace as warranted

## 2024-03-12 NOTE — Assessment & Plan Note (Addendum)
Preventive health care reviewed.  Counseling on healthy lifestyle including diet and exercise.  Recommend regular dental and eye exams.  Immunizations reviewed.  Discussed safety.  

## 2024-03-12 NOTE — Progress Notes (Signed)
 Complete physical exam  Patient: Edward [name]   DOB: 11-Dec-1940   83 y.o. Male  MRN: [id number]  Subjective:    Chief Complaint  Patient presents with   Annual Exam   He is here for a complete physical exam.  Needs eyelid surgery but has not scheduled it.   Sees dermatology yearly   We are also following up on HTN and DM.   LDL 47  Denies chest pain, palpitations or DOE and he exercises.   Vitamin D  - takes a supplement   Played volleyball last night for 2 hours   Shingrix vaccine done per patient   Married. 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    Health Maintenance  Topic Date Due   Zoster (Shingles) Vaccine (2 of 2) 07/23/2020   COVID-19 Vaccine (5 - 2024-25 season) 03/28/2024*   DTaP/Tdap/Td vaccine (3 - Tdap) 04/08/2024   Flu Shot  04/12/2024   Eye exam for diabetics  07/13/2024   Hemoglobin A1C  09/12/2024   Medicare Annual Wellness Visit  01/08/2025   Yearly kidney function blood test for diabetes  03/12/2025   Yearly kidney health urinalysis for diabetes  03/12/2025   Complete foot exam   03/12/2025   Pneumococcal Vaccine for age over 44  Completed   Hepatitis B Vaccine  Aged Out   HPV Vaccine  Aged Out   Meningitis B Vaccine  Aged Out  *Topic was postponed. The date shown is not the original due date.    Wears seatbelt always, uses sunscreen, smoke detectors in home and functioning, does not text while driving, feels safe in home environment.  Depression screening:    01/09/2024    3:27 PM 12/08/2023    1:02 PM 02/09/2023   11:46 PM  Depression screen PHQ 2/9  Decreased Interest 0 0 0  Down, Depressed, Hopeless 0 0 0  PHQ - 2 Score 0 0 0  Altered sleeping 0    Tired, decreased energy 0    Change in appetite 0    Feeling bad or failure about yourself  0    Trouble concentrating 0    Moving slowly or fidgety/restless 0    Suicidal thoughts 0    PHQ-9 Score 0    Difficult doing work/chores Not difficult at all     Anxiety  Screening:     No data to display          Vision:Within last year, Dental: No current dental problems, and PSA: Agrees to PSA testing  Patient Active Problem List   Diagnosis Date Noted   Encounter for general adult medical examination with abnormal findings 03/12/2024   Erectile dysfunction due to type 2 diabetes mellitus (HCC) 04/09/2021   Former smoker 09/02/2018   FHx: heart disease 09/02/2018   CKD stage 3 due to type 2 diabetes mellitus (HCC) 11/02/2017   Type 2 diabetes mellitus with stage 3 chronic kidney disease, without long-term current use of insulin  (HCC) 11/01/2014   Overweight (BMI 25.0-29.9) 07/25/2014   Hyperlipidemia associated with type 2 diabetes mellitus (HCC)    Gastroesophageal reflux disease    Essential hypertension    Vitamin D  deficiency    BPH with obstruction/lower urinary tract symptoms    Past Medical History:  Diagnosis Date   BPH (benign prostatic hyperplasia)    Cataract    surgical removal bilateral   Diabetes mellitus without complication (HCC)    ED (erectile dysfunction)    GERD (  gastroesophageal reflux disease)    History of adenomatous polyp of colon 11/02/2017   Tubular adenoma 2013; recommended 5 year follow up   History of kidney stones    Hyperlipidemia    Hypertension    IBS (irritable bowel syndrome)    Kidney stones 02/11/2020   LBBB (left bundle branch block)    Personal history of kidney stones    Pre-diabetes    Prediabetes    Vitamin D  deficiency    Past Surgical History:  Procedure Laterality Date   CATARACT EXTRACTION Right 2015   CATARACT EXTRACTION W/ INTRAOCULAR LENS IMPLANT  2013   left   COLONOSCOPY  2013   TA   EXTRACORPOREAL SHOCK WAVE LITHOTRIPSY Left 12/11/2017   Procedure: LEFT EXTRACORPOREAL SHOCK WAVE LITHOTRIPSY (ESWL);  Surgeon: Carolee Sherwood JONETTA DOUGLAS, MD;  Location: [facility];  Service: Urology;  Laterality: Left;   KIDNEY STONE SURGERY     POLYPECTOMY     Social History   Tobacco Use   Smoking  status: Former    Current packs/day: 0.00    Average packs/day: 0.5 packs/day for 10.0 years (5.0 ttl pk-yrs)    Types: Cigarettes    Start date: 09/12/1957    Quit date: 09/13/1967    Years since quitting: 56.5    Passive exposure: Past   Smokeless tobacco: Never  Vaping Use   Vaping status: Never Used  Substance Use Topics   Alcohol use: No   Drug use: No      Patient Care Team: [name], NP-C as PCP - General (Family Medicine) Leslee Reusing, MD as Consulting Physician (Ophthalmology) Debrah Lamar JONETTA, MD (Inactive) as Consulting Physician (Gastroenterology) Carolee Sherwood JONETTA DOUGLAS, MD as Consulting Physician (Urology) Nathanael Davies, [facility] (Inactive) as Pharmacist (Pharmacist)   Outpatient Medications Prior to Visit  Medication Sig   APPLE CIDER VINEGAR PO Take 1-2 tablets by mouth daily.   aspirin 81 MG tablet Take 81 mg by mouth 3 (three) times a week.   atorvastatin  (LIPITOR) 40 MG tablet TAKE 1 TABLET BY MOUTH IN THE EVENING FOR CHOLESTEROL   Cholecalciferol (VITAMIN D  PO) Take 5,000 Units by mouth daily.   CINNAMON PO Take 1-2 tablets by mouth daily.   Cyanocobalamin (VITAMIN B 12 PO) Take 1 tablet by mouth daily.   famotidine  (PEPCID ) 40 MG tablet TAKE 1 TABLET BY MOUTH IN THE EVENING TO  PREVENT  HEARTBURN/  INDIGESTION   fluticasone  (FLONASE ) 50 MCG/ACT nasal spray 1-2 Sprays in each nostril once daily for allergies and runny nose.   glucose blood (ONETOUCH VERIO) test strip USE TO TEST BLOOD SUGAR ONCE DAILY   losartan  (COZAAR ) 50 MG tablet Take 1 tablet by mouth once daily for blood pressure   OMEGA-3 FATTY ACIDS PO Take 700 mg by mouth daily.   pantoprazole  (PROTONIX ) 40 MG tablet TAKE 1 TABLET BY MOUTH IN THE EVENING TO  PREVENT  HEARTBURN/INDIGESTION   tamsulosin  (FLOMAX ) 0.4 MG CAPS capsule TAKE 1 CAPSULE BY MOUTH IN THE EVENING FOR PROSTATE   zinc gluconate 50 MG tablet Take 50 mg by mouth daily.   [DISCONTINUED] metFORMIN  (GLUCOPHAGE ) 500 MG tablet TAKE 1  TABLET BY MOUTH THREE TIMES DAILY WITH MEALS FOR DIABETES   No facility-administered medications prior to visit.    Review of Systems  Constitutional:  Negative for chills, fever and malaise/fatigue.  HENT:  Positive for hearing loss. Negative for congestion, ear pain, sinus pain, sore throat and tinnitus.        Does  not want hearing aids   Eyes:  Negative for blurred vision, double vision and pain.  Respiratory:  Negative for cough, shortness of breath and wheezing.   Cardiovascular:  Negative for chest pain, palpitations and leg swelling.  Gastrointestinal:  Negative for abdominal pain, constipation, diarrhea, nausea and vomiting.  Genitourinary:  Negative for dysuria, frequency and urgency.  Musculoskeletal:  Negative for back pain, joint pain and myalgias.  Skin:  Negative for rash.  Neurological:  Negative for dizziness, tingling, focal weakness and headaches.  Psychiatric/Behavioral:  Negative for depression, memory loss and suicidal ideas. The patient is not nervous/anxious.        Objective:    BP 118/68   Pulse 64   Temp 97.8 F (36.6 C) (Temporal)   Ht 5' 5 (1.651 m)   Wt 170 lb (77.1 kg)   SpO2 95%   BMI 28.29 kg/m  BP Readings from Last 3 Encounters:  03/12/24 118/68  01/09/24 138/80  12/08/23 130/72   Wt Readings from Last 3 Encounters:  03/12/24 170 lb (77.1 kg)  01/09/24 170 lb 9.6 oz (77.4 kg)  12/08/23 172 lb (78 kg)    Physical Exam Constitutional:      General: He is not in acute distress.    Appearance: He is not ill-appearing.  HENT:     Right Ear: Tympanic membrane, ear canal and external ear normal.     Left Ear: Tympanic membrane, ear canal and external ear normal.     Nose: Nose normal.     Mouth/Throat:     Mouth: Mucous membranes are moist.     Pharynx: Oropharynx is clear.  Eyes:     Extraocular Movements: Extraocular movements intact.     Conjunctiva/sclera: Conjunctivae normal.     Pupils: Pupils are equal, round, and reactive  to light.  Neck:     Thyroid : No thyroid  mass, thyromegaly or thyroid  tenderness.     Vascular: No carotid bruit or JVD.  Cardiovascular:     Rate and Rhythm: Normal rate and regular rhythm.     Pulses: Normal pulses.     Heart sounds: Normal heart sounds.  Pulmonary:     Effort: Pulmonary effort is normal.     Breath sounds: Normal breath sounds.  Abdominal:     General: Bowel sounds are normal. There is no distension.     Palpations: Abdomen is soft.     Tenderness: There is no abdominal tenderness. There is no right CVA tenderness, left CVA tenderness, guarding or rebound.     Hernia: There is no hernia in the left inguinal area or right inguinal area.  Genitourinary:    Epididymis:     Right: Normal.     Left: Normal.  Musculoskeletal:        General: Normal range of motion.     Cervical back: Normal range of motion and neck supple. No tenderness.     Right lower leg: No edema.     Left lower leg: No edema.  Lymphadenopathy:     Cervical: No cervical adenopathy.     Lower Body: No right inguinal adenopathy. No left inguinal adenopathy.  Skin:    General: Skin is warm and dry.     Findings: No lesion or rash.     Comments: Sun damaged skin   Neurological:     General: No focal deficit present.     Mental Status: He is alert and oriented to person, place, and time.     Cranial  Nerves: No cranial nerve deficit.     Sensory: No sensory deficit.     Motor: No weakness.  Psychiatric:        Mood and Affect: Mood normal.        Behavior: Behavior normal.        Thought Content: Thought content normal.      Results for orders placed or performed in visit on 03/12/24  PSA, Medicare  Result Value Ref Range   PSA 4.08 (H) 0.10 - 4.00 ng/ml  VITAMIN D  25 Hydroxy (Vit-D Deficiency, Fractures)  Result Value Ref Range   VITD 73.99 30.00 - 100.00 ng/mL  T4, free  Result Value Ref Range   Free T4 1.05 0.60 - 1.60 ng/dL  TSH  Result Value Ref Range   TSH 1.84 0.35 - 5.50  uIU/mL  Microalbumin / creatinine urine ratio  Result Value Ref Range   Microalb, Ur 1.0 0.0 - 1.9 mg/dL   Creatinine,U 69.4 mg/dL   Microalb Creat Ratio 31.9 (H) 0.0 - 30.0 mg/g  Hemoglobin A1c  Result Value Ref Range   Hgb A1c MFr Bld 7.0 (H) 4.6 - 6.5 %  Comprehensive metabolic panel with GFR  Result Value Ref Range   Sodium 142 135 - 145 mEq/L   Potassium 3.8 3.5 - 5.1 mEq/L   Chloride 106 96 - 112 mEq/L   CO2 28 19 - 32 mEq/L   Glucose, Bld 119 (H) 70 - 99 mg/dL   BUN 17 6 - 23 mg/dL   Creatinine, Ser 8.76 0.40 - 1.50 mg/dL   Total Bilirubin 0.8 0.2 - 1.2 mg/dL   Alkaline Phosphatase 67 39 - 117 U/L   AST 10 0 - 37 U/L   ALT 9 0 - 53 U/L   Total Protein 6.3 6.0 - 8.3 g/dL   Albumin 4.2 3.5 - 5.2 g/dL   GFR 45.13 (L) >39.99 mL/min   Calcium  8.9 8.4 - 10.5 mg/dL  CBC with Differential/Platelet  Result Value Ref Range   WBC 5.1 4.0 - 10.5 K/uL   RBC 4.85 4.22 - 5.81 Mil/uL   Hemoglobin 14.0 13.0 - 17.0 g/dL   HCT 56.6 60.9 - 47.9 %   MCV 89.2 78.0 - 100.0 fl   MCHC 32.4 30.0 - 36.0 g/dL   RDW 86.0 88.4 - 84.4 %   Platelets 150.0 150.0 - 400.0 K/uL   Neutrophils Relative % 69.1 43.0 - 77.0 %   Lymphocytes Relative 20.6 12.0 - 46.0 %   Monocytes Relative 8.8 3.0 - 12.0 %   Eosinophils Relative 1.1 0.0 - 5.0 %   Basophils Relative 0.4 0.0 - 3.0 %   Neutro Abs 3.5 1.4 - 7.7 K/uL   Lymphs Abs 1.0 0.7 - 4.0 K/uL   Monocytes Absolute 0.4 0.1 - 1.0 K/uL   Eosinophils Absolute 0.1 0.0 - 0.7 K/uL   Basophils Absolute 0.0 0.0 - 0.1 K/uL      Assessment & Plan:    Routine Health Maintenance and Physical Exam  Problem List Items Addressed This Visit     BPH with obstruction/lower urinary tract symptoms   Relevant Orders   PSA, Medicare (Completed)   Encounter for general adult medical examination with abnormal findings   Preventive health care reviewed.  Counseling on healthy lifestyle including diet and exercise.  Recommend regular dental and eye exams.  Immunizations  reviewed.  Discussed safety.         Relevant Orders   EKG 12-Lead   Essential hypertension  Controlled. Continue current medications and low sodium diet.       Relevant Orders   CBC with Differential/Platelet (Completed)   Comprehensive metabolic panel with GFR (Completed)   TSH (Completed)   T4, free (Completed)   Gastroesophageal reflux disease   Controlled on current medications       Hyperlipidemia associated with type 2 diabetes mellitus (HCC)   LDL in goal.       Relevant Medications   metFORMIN  (GLUCOPHAGE ) 500 MG tablet   Type 2 diabetes mellitus with stage 3 chronic kidney disease, without long-term current use of insulin  (HCC) - Primary   A1c in December 6.9%. Continue current therapies.  Monitor renal function.  Get regular eye exams.       Relevant Medications   metFORMIN  (GLUCOPHAGE ) 500 MG tablet   Other Relevant Orders   CBC with Differential/Platelet (Completed)   Comprehensive metabolic panel with GFR (Completed)   Hemoglobin A1c (Completed)   Microalbumin / creatinine urine ratio (Completed)   TSH (Completed)   T4, free (Completed)   Vitamin D  deficiency   Check vitamin D  level and replace as warranted       Relevant Orders   VITAMIN D  25 Hydroxy (Vit-D Deficiency, Fractures) (Completed)   Other Visit Diagnoses       Immunization due       Relevant Orders   Pneumococcal polysaccharide vaccine 23-valent greater than or equal to 2yo subcutaneous/IM (Completed)      EKG shows sinus bradycardia. LAD and LBBB present on previous EKG.   Return in about 6 months (around 09/12/2024).     Boby Mackintosh, NP-C

## 2024-03-18 ENCOUNTER — Telehealth: Payer: Self-pay

## 2024-03-18 NOTE — Telephone Encounter (Signed)
 Copied from CRM [id number]. Topic: Clinical - Medication Question >> Mar 18, 2024  1:50 PM Burnard DEL wrote: Reason for CRM: Patient would like to know if he still takes the jardiance  along with metformin  for blood sugar?

## 2024-03-19 NOTE — Telephone Encounter (Signed)
 Called pt and informed him that he is supposed to be taking both medications, pt verbalized understanding

## 2024-03-22 ENCOUNTER — Other Ambulatory Visit: Payer: Self-pay | Admitting: Family Medicine

## 2024-03-22 DIAGNOSIS — K219 Gastro-esophageal reflux disease without esophagitis: Secondary | ICD-10-CM

## 2024-03-29 ENCOUNTER — Other Ambulatory Visit: Payer: Self-pay | Admitting: Family Medicine

## 2024-03-29 DIAGNOSIS — K219 Gastro-esophageal reflux disease without esophagitis: Secondary | ICD-10-CM

## 2024-05-06 ENCOUNTER — Other Ambulatory Visit: Payer: Self-pay | Admitting: Nurse Practitioner

## 2024-05-06 DIAGNOSIS — E1169 Type 2 diabetes mellitus with other specified complication: Secondary | ICD-10-CM

## 2024-05-23 ENCOUNTER — Other Ambulatory Visit: Payer: Self-pay | Admitting: Family Medicine

## 2024-05-29 ENCOUNTER — Telehealth: Payer: Self-pay | Admitting: Family Medicine

## 2024-05-29 ENCOUNTER — Other Ambulatory Visit: Payer: Self-pay | Admitting: Family Medicine

## 2024-05-29 DIAGNOSIS — E1169 Type 2 diabetes mellitus with other specified complication: Secondary | ICD-10-CM

## 2024-05-29 DIAGNOSIS — E1159 Type 2 diabetes mellitus with other circulatory complications: Secondary | ICD-10-CM

## 2024-05-29 DIAGNOSIS — N183 Chronic kidney disease, stage 3 unspecified: Secondary | ICD-10-CM

## 2024-05-29 NOTE — Telephone Encounter (Unsigned)
 Copied from CRM [id number]. Topic: Clinical - Medication Refill >> May 29, 2024  2:33 PM Shereese L wrote: Medication: atorvastatin  (LIPITOR) 40 MG tablet glucose blood (ONETOUCH VERIO) test strip  Has the patient contacted their pharmacy? Yes (Agent: If no, request that the patient contact the pharmacy for the refill. If patient does not wish to contact the pharmacy document the reason why and proceed with request.) (Agent: If yes, when and what did the pharmacy advise?)  This is the patient's preferred pharmacy:  [facility] [address] [place] - [address] [place] [address] Phone: [phone] Fax: [phone]  [facility] [address] [place] [address] Phone: [phone]   [id number] [place], [place] - [address] [place] [address] Phone: [phone] Fax: [phone]  [id number] [place], [place] - [address] [place] [address] Phone: [phone] Fax: [phone]  Is this the correct pharmacy for this prescription? Yes If no, delete pharmacy and type the correct one.   Has the prescription been filled recently? Yes  Is the patient out of the medication? Yes  Has the patient been seen for an appointment in the last year OR does the patient have an upcoming appointment? Yes  Can we respond through MyChart? Yes  Agent: Please be advised that Rx refills may take up to 3 business days. We ask that you follow-up with your pharmacy.

## 2024-05-30 MED ORDER — ONETOUCH VERIO VI STRP: ORAL_STRIP | 12 refills | Status: DC

## 2024-05-30 MED ORDER — ATORVASTATIN CALCIUM 40 MG PO TABS: ORAL_TABLET | ORAL | 0 refills | Status: DC

## 2024-05-31 ENCOUNTER — Other Ambulatory Visit: Payer: Self-pay

## 2024-05-31 DIAGNOSIS — E1159 Type 2 diabetes mellitus with other circulatory complications: Secondary | ICD-10-CM

## 2024-05-31 MED ORDER — ONETOUCH VERIO VI STRP: ORAL_STRIP | 12 refills | Status: DC

## 2024-06-03 ENCOUNTER — Ambulatory Visit: Payer: PPO | Admitting: Nurse Practitioner

## 2024-06-07 ENCOUNTER — Telehealth: Payer: Self-pay | Admitting: Radiology

## 2024-06-07 NOTE — Telephone Encounter (Signed)
 This was documented in the wrong chart as this needs to go Edward [name]'s chart. Will be following up on this through the encounter in Edward [name]'s chart as no monitor was sent in for [place].

## 2024-06-07 NOTE — Telephone Encounter (Signed)
 Copied from CRM #[id number]. Topic: Clinical - Prescription Issue >> Jun 07, 2024 10:20 AM Carlyon D wrote: Reason for CRM: Pt Wife [name] calling in regards to prescription issue, she states pharmacy stated pcp did not specify which monitor ( One touch Vero reflect) is what is needed on prescription. Pt wife is also stating insurance has denied it and she is not sure why as pt has been using this for several years.  Please reach out to pt wife Mrs. [name]. She states they are outside a lot so its ok to leave a VM

## 2024-06-17 ENCOUNTER — Other Ambulatory Visit: Payer: Self-pay | Admitting: Family Medicine

## 2024-06-17 DIAGNOSIS — N138 Other obstructive and reflux uropathy: Secondary | ICD-10-CM

## 2024-06-17 DIAGNOSIS — K219 Gastro-esophageal reflux disease without esophagitis: Secondary | ICD-10-CM

## 2024-06-18 ENCOUNTER — Ambulatory Visit: Admitting: Family Medicine

## 2024-06-21 ENCOUNTER — Encounter: Payer: Self-pay | Admitting: Family Medicine

## 2024-06-21 ENCOUNTER — Ambulatory Visit (INDEPENDENT_AMBULATORY_CARE_PROVIDER_SITE_OTHER): Admitting: Family Medicine

## 2024-06-21 VITALS — BP 130/84 | HR 60 | Temp 97.9°F | Ht 65.0 in | Wt 167.0 lb

## 2024-06-21 DIAGNOSIS — N401 Enlarged prostate with lower urinary tract symptoms: Secondary | ICD-10-CM | POA: Diagnosis not present

## 2024-06-21 DIAGNOSIS — E785 Hyperlipidemia, unspecified: Secondary | ICD-10-CM

## 2024-06-21 DIAGNOSIS — Z23 Encounter for immunization: Secondary | ICD-10-CM | POA: Diagnosis not present

## 2024-06-21 DIAGNOSIS — R809 Proteinuria, unspecified: Secondary | ICD-10-CM | POA: Diagnosis not present

## 2024-06-21 DIAGNOSIS — N1831 Chronic kidney disease, stage 3a: Secondary | ICD-10-CM

## 2024-06-21 DIAGNOSIS — E1122 Type 2 diabetes mellitus with diabetic chronic kidney disease: Secondary | ICD-10-CM | POA: Diagnosis not present

## 2024-06-21 DIAGNOSIS — I10 Essential (primary) hypertension: Secondary | ICD-10-CM | POA: Diagnosis not present

## 2024-06-21 DIAGNOSIS — E1169 Type 2 diabetes mellitus with other specified complication: Secondary | ICD-10-CM | POA: Diagnosis not present

## 2024-06-21 DIAGNOSIS — R972 Elevated prostate specific antigen [PSA]: Secondary | ICD-10-CM

## 2024-06-21 DIAGNOSIS — Z7984 Long term (current) use of oral hypoglycemic drugs: Secondary | ICD-10-CM

## 2024-06-21 DIAGNOSIS — N138 Other obstructive and reflux uropathy: Secondary | ICD-10-CM

## 2024-06-21 LAB — MICROALBUMIN / CREATININE URINE RATIO
Creatinine,U: 61.9 mg/dL
Microalb Creat Ratio: 55.3 mg/g — ABNORMAL HIGH (ref 0.0–30.0)
Microalb, Ur: 3.4 mg/dL — ABNORMAL HIGH (ref 0.0–1.9)

## 2024-06-21 LAB — LIPID PANEL
Cholesterol: 129 mg/dL (ref 0–200)
HDL: 52.5 mg/dL (ref >39.00)
LDL Cholesterol: 59 mg/dL (ref 0–99)
NonHDL: 76.19
Total CHOL/HDL Ratio: 2
Triglycerides: 86 mg/dL (ref 0.0–149.0)
VLDL: 17.2 mg/dL (ref 0.0–40.0)

## 2024-06-21 LAB — CBC WITH DIFFERENTIAL/PLATELET
Basophils Absolute: 0 K/uL (ref 0.0–0.1)
Basophils Relative: 0.3 % (ref 0.0–3.0)
Eosinophils Absolute: 0.1 K/uL (ref 0.0–0.7)
Eosinophils Relative: 1.2 % (ref 0.0–5.0)
HCT: 44 % (ref 39.0–52.0)
Hemoglobin: 14 g/dL (ref 13.0–17.0)
Lymphocytes Relative: 21.2 % (ref 12.0–46.0)
Lymphs Abs: 1 K/uL (ref 0.7–4.0)
MCHC: 31.7 g/dL (ref 30.0–36.0)
MCV: 89.3 fl (ref 78.0–100.0)
Monocytes Absolute: 0.4 K/uL (ref 0.1–1.0)
Monocytes Relative: 9 % (ref 3.0–12.0)
Neutro Abs: 3.3 K/uL (ref 1.4–7.7)
Neutrophils Relative %: 68.3 % (ref 43.0–77.0)
Platelets: 153 K/uL (ref 150.0–400.0)
RBC: 4.93 Mil/uL (ref 4.22–5.81)
RDW: 14.5 % (ref 11.5–15.5)
WBC: 4.8 K/uL (ref 4.0–10.5)

## 2024-06-21 LAB — COMPREHENSIVE METABOLIC PANEL WITH GFR
ALT: 11 U/L (ref 0–53)
AST: 10 U/L (ref 0–37)
Albumin: 4.4 g/dL (ref 3.5–5.2)
Alkaline Phosphatase: 72 U/L (ref 39–117)
BUN: 18 mg/dL (ref 6–23)
CO2: 29 meq/L (ref 19–32)
Calcium: 9 mg/dL (ref 8.4–10.5)
Chloride: 106 meq/L (ref 96–112)
Creatinine, Ser: 1.3 mg/dL (ref 0.40–1.50)
GFR: 51.23 mL/min — ABNORMAL LOW (ref >60.00)
Glucose, Bld: 107 mg/dL — ABNORMAL HIGH (ref 70–99)
Potassium: 4.1 meq/L (ref 3.5–5.1)
Sodium: 142 meq/L (ref 135–145)
Total Bilirubin: 0.8 mg/dL (ref 0.2–1.2)
Total Protein: 6.7 g/dL (ref 6.0–8.3)

## 2024-06-21 LAB — HEMOGLOBIN A1C: Hgb A1c MFr Bld: 7 % — ABNORMAL HIGH (ref 4.6–6.5)

## 2024-06-21 NOTE — Progress Notes (Signed)
 Subjective:     Patient ID: Edward [name], male    DOB: 12-Feb-1941, 83 y.o.   MRN: [id number]  Chief Complaint  Patient presents with   Medical Management of Chronic Issues    3 month f/u    HPI  Discussed the use of AI scribe software for clinical note transcription with the patient, who gave verbal consent to proceed.  History of Present Illness Edward [name] is an 83 year old male with diabetes, chronic kidney disease, and elevated PSA who presents for a three-month follow-up.  Glycemic control and antihyperglycemic therapy - Diabetes managed with Jardiance  and metformin  - Recent initiation of Jardiance  - Stable blood glucose levels around 122 mg/dL - Difficulty obtaining metformin  and test strips from the pharmacy - Continues to take Jardiance  and metformin   Unintentional weight loss and gastrointestinal symptoms - clinic scale shows 167 lbs today and 170 on July 1st  - Weight loss attributed to Jardiance  - Decreased appetite and early satiety - No dizziness, chest pain, or changes in bowel habits  Lower urinary tract symptoms and elevated psa - Elevated PSA - No urinary symptoms or trouble urinating - Slow urination in the morning - No prior consultation with a urologist  Chronic kidney disease awareness - Unaware of chronic kidney disease diagnosis  Physical activity - Remains active, playing volleyball weekly     Health Maintenance Due  Topic Date Due   Zoster Vaccines- Shingrix (2 of 2) 07/23/2020   DTaP/Tdap/Td (3 - Tdap) 04/08/2024    Past Medical History:  Diagnosis Date   BPH (benign prostatic hyperplasia)    Cataract    surgical removal bilateral   Diabetes mellitus without complication (HCC)    ED (erectile dysfunction)    GERD (gastroesophageal reflux disease)    History of adenomatous polyp of colon 11/02/2017   Tubular adenoma 2013; recommended 5 year follow up   History of kidney stones    Hyperlipidemia     Hypertension    IBS (irritable bowel syndrome)    Kidney stones 02/11/2020   LBBB (left bundle branch block)    Personal history of kidney stones    Pre-diabetes    Prediabetes    Vitamin D  deficiency     Past Surgical History:  Procedure Laterality Date   CATARACT EXTRACTION Right 2015   CATARACT EXTRACTION W/ INTRAOCULAR LENS IMPLANT  2013   left   COLONOSCOPY  2013   TA   EXTRACORPOREAL SHOCK WAVE LITHOTRIPSY Left 12/11/2017   Procedure: LEFT EXTRACORPOREAL SHOCK WAVE LITHOTRIPSY (ESWL);  Surgeon: Carolee Sherwood JONETTA DOUGLAS, MD;  Location: [facility];  Service: Urology;  Laterality: Left;   KIDNEY STONE SURGERY     POLYPECTOMY      Family History  Problem Relation Age of Onset   Diabetes Sister    Hypertension Sister    Heart disease Mother    Diabetes Sister    Hypertension Sister    Colon cancer Neg Hx    Colon polyps Neg Hx    Esophageal cancer Neg Hx    Rectal cancer Neg Hx    Stomach cancer Neg Hx     Social History   Socioeconomic History   Marital status: Married    Spouse name: Not on file   Number of children: Not on file   Years of education: Not on file   Highest education level: Not on file  Occupational History   Not on file  Tobacco Use   Smoking status:  Former    Current packs/day: 0.00    Average packs/day: 0.5 packs/day for 10.0 years (5.0 ttl pk-yrs)    Types: Cigarettes    Start date: 09/12/1957    Quit date: 09/13/1967    Years since quitting: 56.8    Passive exposure: Past   Smokeless tobacco: Never  Vaping Use   Vaping status: Never Used  Substance and Sexual Activity   Alcohol use: No   Drug use: No   Sexual activity: Not Currently  Other Topics Concern   Not on file  Social History Narrative   Married   Social Drivers of Health   Financial Resource Strain: Low Risk  (01/09/2024)   Overall Financial Resource Strain (CARDIA)    Difficulty of Paying Living Expenses: Not hard at all  Food Insecurity: No Food Insecurity (01/09/2024)   Hunger  Vital Sign    Worried About Running Out of Food in the Last Year: Never true    Ran Out of Food in the Last Year: Never true  Transportation Needs: No Transportation Needs (01/09/2024)   PRAPARE - Administrator, Civil Service (Medical): No    Lack of Transportation (Non-Medical): No  Physical Activity: Sufficiently Active (01/09/2024)   Exercise Vital Sign    Days of Exercise per Week: 4 days    Minutes of Exercise per Session: 60 min  Stress: No Stress Concern Present (01/09/2024)   [employer] of Occupational Health - Occupational Stress Questionnaire    Feeling of Stress : Not at all  Social Connections: Socially Integrated (01/09/2024)   Social Connection and Isolation Panel    Frequency of Communication with Friends and Family: More than three times a week    Frequency of Social Gatherings with Friends and Family: More than three times a week    Attends Religious Services: More than 4 times per year    Active Member of [employer] or Organizations: Yes    Attends Engineer, structural: More than 4 times per year    Marital Status: Married  Catering manager Violence: Not At Risk (01/09/2024)   Humiliation, Afraid, Rape, and Kick questionnaire    Fear of Current or Ex-Partner: No    Emotionally Abused: No    Physically Abused: No    Sexually Abused: No    Outpatient Medications Prior to Visit  Medication Sig Dispense Refill   APPLE CIDER VINEGAR PO Take 1-2 tablets by mouth daily.     aspirin 81 MG tablet Take 81 mg by mouth 3 (three) times a week.     atorvastatin  (LIPITOR) 40 MG tablet TAKE 1 TABLET BY MOUTH IN THE EVENING FOR CHOLESTEROL 270 tablet 0   Cholecalciferol (VITAMIN D  PO) Take 5,000 Units by mouth daily.     CINNAMON PO Take 1-2 tablets by mouth daily.     Cyanocobalamin (VITAMIN B 12 PO) Take 1 tablet by mouth daily.     famotidine  (PEPCID ) 40 MG tablet TAKE 1 TABLET BY MOUTH IN THE EVENING TO  PREVENT  HEARTBURN/INDIGESTION 90 tablet 0    fluticasone  (FLONASE ) 50 MCG/ACT nasal spray 1-2 Sprays in each nostril once daily for allergies and runny nose. 16 g 1   glucose blood (ONETOUCH VERIO) test strip USE TO TEST BLOOD SUGAR ONCE DAILY 100 strip 12   JARDIANCE  10 MG TABS tablet Take 1 tablet by mouth once daily 90 tablet 0   losartan  (COZAAR ) 50 MG tablet Take 1 tablet by mouth once daily for blood pressure 90  tablet 0   metFORMIN  (GLUCOPHAGE ) 500 MG tablet TAKE 1 TABLET BY MOUTH THREE TIMES DAILY WITH MEALS FOR DIABETES 270 tablet 0   OMEGA-3 FATTY ACIDS PO Take 700 mg by mouth daily.     pantoprazole  (PROTONIX ) 40 MG tablet TAKE 1 TABLET BY MOUTH IN THE EVENING TO  PREVENT  HEARTBURN/INDIGESTION 90 tablet 0   tamsulosin  (FLOMAX ) 0.4 MG CAPS capsule TAKE 1 CAPSULE BY MOUTH IN THE EVENING FOR PROSTATE 90 capsule 0   zinc gluconate 50 MG tablet Take 50 mg by mouth daily.     No facility-administered medications prior to visit.    Allergies  Allergen Reactions   Bystolic [Nebivolol Hcl]     ROS Per HPI     Objective:    Physical Exam Constitutional:      General: He is not in acute distress.    Appearance: He is not ill-appearing.  HENT:     Mouth/Throat:     Mouth: Mucous membranes are moist.     Pharynx: Oropharynx is clear.  Eyes:     Extraocular Movements: Extraocular movements intact.     Conjunctiva/sclera: Conjunctivae normal.     Pupils: Pupils are equal, round, and reactive to light.  Cardiovascular:     Rate and Rhythm: Normal rate and regular rhythm.  Pulmonary:     Effort: Pulmonary effort is normal.     Breath sounds: Normal breath sounds.  Musculoskeletal:     Cervical back: Normal range of motion and neck supple. No tenderness.     Right lower leg: No edema.     Left lower leg: No edema.  Lymphadenopathy:     Cervical: No cervical adenopathy.  Skin:    General: Skin is warm and dry.  Neurological:     General: No focal deficit present.     Mental Status: He is alert and oriented to person,  place, and time.     Motor: No weakness.     Coordination: Coordination normal.     Gait: Gait normal.  Psychiatric:        Mood and Affect: Mood normal.        Behavior: Behavior normal.        Thought Content: Thought content normal.      BP 130/84   Pulse 60   Temp 97.9 F (36.6 C) (Temporal)   Ht 5' 5 (1.651 m)   Wt 167 lb (75.8 kg)   SpO2 96%   BMI 27.79 kg/m  Wt Readings from Last 3 Encounters:  06/21/24 167 lb (75.8 kg)  03/12/24 170 lb (77.1 kg)  01/09/24 170 lb 9.6 oz (77.4 kg)       Assessment & Plan:   Problem List Items Addressed This Visit     BPH with obstruction/lower urinary tract symptoms   Essential hypertension   Relevant Orders   CBC with Differential/Platelet (Completed)   Comprehensive metabolic panel with GFR (Completed)   Hyperlipidemia associated with type 2 diabetes mellitus (HCC)   Relevant Orders   Lipid panel (Completed)   Type 2 diabetes mellitus with stage 3 chronic kidney disease, without long-term current use of insulin  (HCC) - Primary   Relevant Orders   CBC with Differential/Platelet (Completed)   Comprehensive metabolic panel with GFR (Completed)   Hemoglobin A1c (Completed)   Other Visit Diagnoses       Need for influenza vaccination       Relevant Orders   Flu vaccine HIGH DOSE PF(Fluzone Trivalent) (Completed)  Elevated PSA       Relevant Orders   PSA, total and free     Urine test positive for microalbuminuria       Relevant Orders   Microalbumin / creatinine urine ratio (Completed)       Assessment and Plan Assessment & Plan Type 2 diabetes mellitus Type 2 diabetes mellitus managed with Jardiance  and metformin . Blood glucose levels are well-controlled at 122 mg/dL. Weight loss likely due to Jardiance , which is known to cause weight reduction. No dizziness, chest pain, or changes in bowel habits. Appetite reduced with early satiety since starting Jardiance . - Ensure availability of OneTouch Vireo test strips  for blood glucose monitoring - Continue Jardiance  and metformin  - Reinforce importance of medication adherence and follow-up with pharmacy for medication availability  Chronic kidney disease Chronic kidney disease secondary to diabetes and hypertension. Jardiance  is expected to aid in renal function improvement. - Continue Jardiance  for renal protection - Monitor kidney function regularly  Elevated prostate-specific antigen (PSA) Elevated PSA with no urinary symptoms except slow urination in the morning. Differential diagnosis includes benign prostatic hyperplasia. Further evaluation needed. - Recheck PSA levels  Hyperlipidemia Hyperlipidemia managed with atorvastatin . Running low on medication. - Ensure atorvastatin  refill is obtained from pharmacy - Continue atorvastatin  as prescribed  General Health Maintenance Open to receiving flu vaccination. - Administer flu shot  Follow-Up Follow-up arrangements discussed for continuity of care and medication management. - Schedule follow-up appointment in three months - Instruct to inform office if medications or test strips are unavailable at the pharmacy - Ensure preference for phone call communication for appointment reminders and results     I am having Perlie R. Judice Toren maintain his aspirin, Cholecalciferol (VITAMIN D  PO), CINNAMON PO, APPLE CIDER VINEGAR PO, zinc gluconate, Cyanocobalamin (VITAMIN B 12 PO), OMEGA-3 FATTY ACIDS PO, fluticasone , losartan , pantoprazole , Jardiance , metFORMIN , atorvastatin , OneTouch Verio, famotidine , and tamsulosin .  No orders of the defined types were placed in this encounter.

## 2024-06-24 LAB — PSA, TOTAL AND FREE
PSA, % Free: 18 % — ABNORMAL LOW (ref >25)
PSA, Free: 0.6 ng/mL
PSA, Total: 3.3 ng/mL (ref <=4.0)

## 2024-06-26 ENCOUNTER — Ambulatory Visit: Payer: Self-pay | Admitting: Family Medicine

## 2024-06-26 ENCOUNTER — Ambulatory Visit: Payer: Self-pay

## 2024-06-26 NOTE — Progress Notes (Signed)
 He has an acute appt with Corean on Thursday. Please ensure he gets his lab results. I recall that he did not know how to get on the portal. Thanks.

## 2024-06-26 NOTE — Telephone Encounter (Signed)
 Pt is scheduled to see mathews tommorow

## 2024-06-26 NOTE — Telephone Encounter (Signed)
 FYI Only or Action Required?: FYI only for provider.  Patient was last seen in primary care on 06/21/2024 by [name], NP-C.  Called Nurse Triage reporting Spasms.  Symptoms began several days ago.  Interventions attempted: OTC medications: advil; voltaren gel.  Symptoms are: gradually worsening.  Triage Disposition: See PCP When Office is Open (Within 3 Days)  Patient/caregiver understands and will follow disposition?: Yes  Copied from CRM [id number]. Topic: Clinical - Red Word Triage >> Jun 26, 2024  9:20 AM Mia F wrote: Red Word that prompted transfer to Nurse Triage: Pains on right side. Feel like muscle spasams. When moving he feels it more. Pain almost makes him yell. Started Monday. Left shoulder hurts as well. Specifically when lifting. Reason for Disposition  [1] MODERATE pain (e.g., interferes with normal activities) AND [2] present > 3 days  Answer Assessment - Initial Assessment Questions Pt states Monday he started having muscle spasms. They begin on the L side near his ribs and radiate from there. Also states he has pain to his L shoulder at the joint when he tries to lift something. Denies any other sxs at this time. Appointment scheduled for tomorrow.   1. ONSET: When did the muscle aches or body pains start?      monday 2. LOCATION: What part of your body is hurting? (e.g., entire body, arms, legs)      Middle of Left side. Near ribs  3. SEVERITY: How bad is the pain? (Scale 1-10; or mild, moderate, severe)     7/10 4. CAUSE: What do you think is causing the pains?     Muscle spasms but unsure why 5. FEVER: Do you have a fever? If Yes, ask: What is your temperature, how was it measured, and  when did it start?      denies 6. OTHER SYMPTOMS: Do you have any other symptoms? (e.g., chest pain, cold or flu symptoms, rash, weakness, weight loss)     Denies; also states his L shoulder hurts right at the joint and only when lifting.  Protocols used:  Muscle Aches and Body Pain-A-AH

## 2024-06-27 ENCOUNTER — Other Ambulatory Visit (HOSPITAL_COMMUNITY): Payer: Self-pay

## 2024-06-27 ENCOUNTER — Ambulatory Visit: Payer: Self-pay | Admitting: Family Medicine

## 2024-06-27 ENCOUNTER — Ambulatory Visit: Admitting: Family Medicine

## 2024-06-27 ENCOUNTER — Encounter: Payer: Self-pay | Admitting: Family Medicine

## 2024-06-27 ENCOUNTER — Ambulatory Visit (INDEPENDENT_AMBULATORY_CARE_PROVIDER_SITE_OTHER)

## 2024-06-27 VITALS — BP 130/70 | HR 62 | Temp 97.9°F | Ht 65.0 in | Wt 161.8 lb

## 2024-06-27 DIAGNOSIS — H1010 Acute atopic conjunctivitis, unspecified eye: Secondary | ICD-10-CM | POA: Insufficient documentation

## 2024-06-27 DIAGNOSIS — I10 Essential (primary) hypertension: Secondary | ICD-10-CM

## 2024-06-27 DIAGNOSIS — R0789 Other chest pain: Secondary | ICD-10-CM

## 2024-06-27 DIAGNOSIS — E1159 Type 2 diabetes mellitus with other circulatory complications: Secondary | ICD-10-CM | POA: Insufficient documentation

## 2024-06-27 DIAGNOSIS — R918 Other nonspecific abnormal finding of lung field: Secondary | ICD-10-CM | POA: Insufficient documentation

## 2024-06-27 DIAGNOSIS — K219 Gastro-esophageal reflux disease without esophagitis: Secondary | ICD-10-CM

## 2024-06-27 DIAGNOSIS — M62838 Other muscle spasm: Secondary | ICD-10-CM | POA: Diagnosis not present

## 2024-06-27 DIAGNOSIS — N138 Other obstructive and reflux uropathy: Secondary | ICD-10-CM

## 2024-06-27 DIAGNOSIS — E1169 Type 2 diabetes mellitus with other specified complication: Secondary | ICD-10-CM

## 2024-06-27 DIAGNOSIS — E1122 Type 2 diabetes mellitus with diabetic chronic kidney disease: Secondary | ICD-10-CM

## 2024-06-27 MED ORDER — METFORMIN HCL 500 MG PO TABS
500.0000 mg | ORAL_TABLET | Freq: Three times a day (TID) | ORAL | 0 refills | Status: DC
  Filled 2024-06-27: qty 270, fill #0
  Filled 2024-06-27 – 2024-08-16 (×5): qty 270, 90d supply, fill #0
  Filled ????-??-??: fill #0

## 2024-06-27 MED ORDER — LOSARTAN POTASSIUM 50 MG PO TABS
50.0000 mg | ORAL_TABLET | Freq: Every day | ORAL | 0 refills | Status: DC
Start: 2024-06-27 — End: 2024-08-06
  Filled 2024-06-27: qty 90, fill #0
  Filled 2024-06-27: qty 90, 90d supply, fill #0

## 2024-06-27 MED ORDER — ONETOUCH VERIO VI STRP
ORAL_STRIP | 12 refills | Status: DC
  Filled 2024-06-27: qty 100, 90d supply, fill #0
  Filled 2024-06-27: qty 100, fill #0

## 2024-06-27 MED ORDER — EMPAGLIFLOZIN 10 MG PO TABS
10.0000 mg | ORAL_TABLET | Freq: Every day | ORAL | 0 refills | Status: DC
  Filled 2024-06-27 – 2024-08-05 (×6): qty 90, 90d supply, fill #0

## 2024-06-27 MED ORDER — PANTOPRAZOLE SODIUM 40 MG PO TBEC
40.0000 mg | DELAYED_RELEASE_TABLET | Freq: Every evening | ORAL | 0 refills | Status: DC
Start: 2024-06-27 — End: 2024-07-15
  Filled 2024-06-27: qty 90, fill #0
  Filled 2024-06-27: qty 90, 90d supply, fill #0

## 2024-06-27 MED ORDER — TIZANIDINE HCL 2 MG PO CAPS
2.0000 mg | ORAL_CAPSULE | Freq: Two times a day (BID) | ORAL | 0 refills | Status: DC | PRN
  Filled 2024-06-27 (×2): qty 30, 15d supply, fill #0

## 2024-06-27 MED ORDER — FAMOTIDINE 40 MG PO TABS
40.0000 mg | ORAL_TABLET | Freq: Every evening | ORAL | 0 refills | Status: AC
  Filled 2024-06-27: qty 90, fill #0
  Filled 2024-06-27 – 2024-10-01 (×6): qty 90, 90d supply, fill #0
  Filled ????-??-??: fill #0

## 2024-06-27 MED ORDER — ONETOUCH VERIO VI STRP: ORAL_STRIP | 12 refills | Status: DC

## 2024-06-27 MED ORDER — TIZANIDINE HCL 2 MG PO CAPS
2.0000 mg | ORAL_CAPSULE | Freq: Two times a day (BID) | ORAL | 0 refills | Status: DC | PRN
Start: 2024-06-27 — End: 2024-06-27

## 2024-06-27 MED ORDER — TAMSULOSIN HCL 0.4 MG PO CAPS
0.4000 mg | ORAL_CAPSULE | Freq: Every evening | ORAL | 0 refills | Status: AC
  Filled 2024-06-27: qty 90, fill #0
  Filled 2024-06-27 – 2024-08-16 (×5): qty 90, 90d supply, fill #0
  Filled 2024-09-03: qty 30, 30d supply, fill #0
  Filled 2024-10-01: qty 90, 90d supply, fill #0

## 2024-06-27 MED ORDER — FLUTICASONE PROPIONATE 50 MCG/ACT NA SUSP
1.0000 | Freq: Every day | NASAL | 1 refills | Status: DC
  Filled 2024-06-27: qty 16, fill #0
  Filled 2024-06-27: qty 16, 30d supply, fill #0
  Filled 2024-07-15 – 2024-08-04 (×2): qty 16, 30d supply, fill #1

## 2024-06-27 MED ORDER — ATORVASTATIN CALCIUM 40 MG PO TABS
40.0000 mg | ORAL_TABLET | Freq: Every evening | ORAL | 0 refills | Status: DC
  Filled 2024-06-27: qty 270, fill #0
  Filled 2024-06-27: qty 270, 270d supply, fill #0
  Filled 2024-07-15: qty 90, 90d supply, fill #0
  Filled 2024-07-23 – 2024-07-31 (×2): qty 270, 270d supply, fill #0
  Filled 2024-08-06: qty 100, 100d supply, fill #0

## 2024-06-27 NOTE — Progress Notes (Signed)
 Acute Office Visit  Subjective:     Patient ID: Edward [name], male    DOB: 1941/07/25, 83 y.o.   MRN: [id number]  Chief Complaint  Patient presents with   Acute Visit    Rt side rib pain and muscle spasms. Started Monday progressively getting worse    HPI  Discussed the use of AI scribe software for clinical note transcription with the patient, who gave verbal consent to proceed.  History of Present Illness Edward [name] is an 83 year old male who presents with chest and rib pain after playing volleyball.  Right rib pain - Onset after playing volleyball on Monday night - Pain localized to chest and rib area - Pain described as sharp and stabbing - Pain intensifies with coughing or sneezing - Deep breathing does not significantly worsen pain - Pain increased with each hit of the volleyball, leading to cessation of activity - Similar episode in the past responded to muscle relaxer  Functional status and activity - Remains active, including riding a lawnmower and performing yard work - Psychologist, sport and exercise work involves reaching and turning  Symptom management - Applied hot water and athletic rub to affected area - Topical treatments provided some relief     ROS Per HPI      Objective:    BP 130/70 (BP Location: Left Arm, Patient Position: Sitting)   Pulse 62   Temp 97.9 F (36.6 C) (Oral)   Ht 5' 5 (1.651 m)   Wt 161 lb 12.8 oz (73.4 kg)   SpO2 96%   BMI 26.92 kg/m    Physical Exam Vitals and nursing note reviewed.  Constitutional:      General: He is not in acute distress.    Appearance: Normal appearance.  HENT:     Head: Normocephalic and atraumatic.     Right Ear: External ear normal.     Left Ear: External ear normal.     Nose: Nose normal.     Mouth/Throat:     Mouth: Mucous membranes are moist.     Pharynx: Oropharynx is clear.  Eyes:     Extraocular Movements: Extraocular movements intact.  Cardiovascular:     Rate and Rhythm:  Normal rate and regular rhythm.     Pulses: Normal pulses.     Heart sounds: Normal heart sounds.  Pulmonary:     Effort: Pulmonary effort is normal. No respiratory distress.     Breath sounds: Normal breath sounds. No wheezing, rhonchi or rales.  Musculoskeletal:        General: Normal range of motion.       Arms:     Cervical back: Normal range of motion.     Right lower leg: No edema.     Left lower leg: No edema.     Comments: Area of exquisite tenderness, muscles surrounding this area mildly swollen, mildly tender, and spasm.  No erythema, no bruising, no obvious deformity  Lymphadenopathy:     Cervical: No cervical adenopathy.  Skin:    General: Skin is warm and dry.  Neurological:     General: No focal deficit present.     Mental Status: He is alert and oriented to person, place, and time.  Psychiatric:        Mood and Affect: Mood normal.        Behavior: Behavior normal.     No results found for any visits on 06/27/24.      Assessment & Plan:  Assessment and Plan Assessment & Plan Right rib pain, muscle spasm Acute pain post-volleyball, likely muscle strain or rib fracture. No pneumothorax. Previous episode resolved with muscle relaxers. - Order right rib X-ray to rule out fracture. - Prescribe tizanidine for pain, advise evening use due to drowsiness. - Advise rest, avoid pain-exacerbating activities. - Call with X-ray results.     Orders Placed This Encounter  Procedures   DG Ribs Unilateral W/Chest Right    Standing Status:   Future    Number of Occurrences:   1    Expiration Date:   12/26/2024    Reason for Exam (SYMPTOM  OR DIAGNOSIS REQUIRED):   right rib pain    Preferred imaging location?:   [facility]     Meds ordered this encounter  Medications   tizanidine (ZANAFLEX) 2 MG capsule    Sig: Take 1 capsule (2 mg total) by mouth 2 (two) times daily as needed for muscle spasms.    Dispense:  30 capsule    Refill:  0    Return if  symptoms worsen or fail to improve.  Corean LITTIE Ku, FNP

## 2024-06-27 NOTE — Patient Instructions (Signed)
 We are getting an xray today. We will be in contact with any abnormal results that require further attention.  May use heat or ice to the area for relief as needed.  May use topical rubs or gels to the area as needed for relief.  Attached subtle stretching exercises to help relieve pain and strengthen muscles.   I have sent in a muscle relaxer for you to use one tablet as needed for muscle spasms. This medication can make you sleepy. Do not drive until you know how this medication affects you.  Follow-up with me for new or worsening symptoms.

## 2024-06-27 NOTE — Addendum Note (Signed)
 Addended by: ALVIA COREAN CROME on: 06/27/2024 12:52 PM   Modules accepted: Orders, Level of Service

## 2024-06-27 NOTE — Addendum Note (Signed)
 Addended by: Christen Wardrop L on: 06/27/2024 12:24 PM   Modules accepted: Orders

## 2024-06-27 NOTE — Addendum Note (Signed)
 Addended by: Berdell Hostetler L on: 06/27/2024 12:44 PM   Modules accepted: Orders

## 2024-06-27 NOTE — Progress Notes (Signed)
 Discussed in office with patient and his wife. Stat chest, abdomen and pelvis CTs with contrast ordered for further evaluation

## 2024-07-01 ENCOUNTER — Inpatient Hospital Stay: Admission: RE | Admit: 2024-07-01 | Source: Ambulatory Visit

## 2024-07-01 ENCOUNTER — Other Ambulatory Visit

## 2024-07-02 ENCOUNTER — Other Ambulatory Visit: Payer: Self-pay | Admitting: Family Medicine

## 2024-07-02 ENCOUNTER — Telehealth: Payer: Self-pay

## 2024-07-02 ENCOUNTER — Ambulatory Visit
Admission: RE | Admit: 2024-07-02 | Discharge: 2024-07-02 | Disposition: A | Discharge status: Home / Self Care | Source: Ambulatory Visit | Attending: Family Medicine | Admitting: Family Medicine

## 2024-07-02 DIAGNOSIS — R0789 Other chest pain: Secondary | ICD-10-CM

## 2024-07-02 DIAGNOSIS — K219 Gastro-esophageal reflux disease without esophagitis: Secondary | ICD-10-CM

## 2024-07-02 DIAGNOSIS — N138 Other obstructive and reflux uropathy: Secondary | ICD-10-CM

## 2024-07-02 DIAGNOSIS — M62838 Other muscle spasm: Secondary | ICD-10-CM

## 2024-07-02 DIAGNOSIS — N183 Chronic kidney disease, stage 3 unspecified: Secondary | ICD-10-CM

## 2024-07-02 DIAGNOSIS — I10 Essential (primary) hypertension: Secondary | ICD-10-CM

## 2024-07-02 DIAGNOSIS — E1169 Type 2 diabetes mellitus with other specified complication: Secondary | ICD-10-CM

## 2024-07-02 DIAGNOSIS — H1010 Acute atopic conjunctivitis, unspecified eye: Secondary | ICD-10-CM

## 2024-07-02 DIAGNOSIS — R918 Other nonspecific abnormal finding of lung field: Secondary | ICD-10-CM

## 2024-07-02 DIAGNOSIS — E1159 Type 2 diabetes mellitus with other circulatory complications: Secondary | ICD-10-CM

## 2024-07-02 MED ORDER — IOPAMIDOL (ISOVUE-300) INJECTION 61%
100.0000 mL | Freq: Once | INTRAVENOUS | Status: AC | PRN
  Administered 2024-07-02: 100 mL via INTRAVENOUS

## 2024-07-02 NOTE — Telephone Encounter (Signed)
 CRITICAL VALUE STICKER  CRITICAL VALUE: CT results posted, wanted to make aware of:  Large, lobular mass of the inferior right hilum, with frank occlusion of the right middle lobe bronchi and marked narrowing of the right lower lobar and proximal segmental airways. Findings are consistent with primary lung malignancy.  RECEIVER (on-site recipient of call): [name]  DATE & TIME NOTIFIED: 07/02/24 at 3:20 pm

## 2024-07-04 ENCOUNTER — Ambulatory Visit: Payer: Self-pay | Admitting: Family Medicine

## 2024-07-04 ENCOUNTER — Telehealth: Payer: Self-pay | Admitting: Family Medicine

## 2024-07-04 NOTE — Addendum Note (Signed)
 Addended by: Duvid Smalls L on: 07/04/2024 10:02 AM   Modules accepted: Orders

## 2024-07-04 NOTE — Telephone Encounter (Signed)
 Spoke with patient and his wife today about CT results. Urgent referral placed to oncology with [facility]. Questions answered today, directed to follow-up with oncology

## 2024-07-04 NOTE — Progress Notes (Signed)
 Spoke with patient and wife via phone today about CT results. Questions answered, urgent referral to oncology placed with [facility] per their choice, directed to follow-up there

## 2024-07-05 ENCOUNTER — Telehealth: Payer: Self-pay

## 2024-07-05 NOTE — Telephone Encounter (Signed)
 Patient scheduled with Dr.Mohammed already as of this morning

## 2024-07-05 NOTE — Telephone Encounter (Signed)
 Copied from CRM [id number]. Topic: Referral - Status >> Jul 05, 2024  9:35 AM Thersia BROCKS wrote: Reason for CRM: Patient called in stated leaving NP Corean Ku a note Wanted to know if the referral could be sent to   [facility], [place]  [phone]

## 2024-07-10 ENCOUNTER — Other Ambulatory Visit: Payer: Self-pay | Admitting: *Deleted

## 2024-07-10 DIAGNOSIS — R918 Other nonspecific abnormal finding of lung field: Secondary | ICD-10-CM

## 2024-07-11 ENCOUNTER — Inpatient Hospital Stay: Attending: Internal Medicine | Admitting: Internal Medicine

## 2024-07-11 ENCOUNTER — Inpatient Hospital Stay

## 2024-07-11 VITALS — BP 134/86 | HR 95 | Temp 97.0°F | Resp 17 | Ht 69.0 in | Wt 163.0 lb

## 2024-07-11 DIAGNOSIS — J9811 Atelectasis: Secondary | ICD-10-CM | POA: Insufficient documentation

## 2024-07-11 DIAGNOSIS — Z87891 Personal history of nicotine dependence: Secondary | ICD-10-CM | POA: Insufficient documentation

## 2024-07-11 DIAGNOSIS — R918 Other nonspecific abnormal finding of lung field: Secondary | ICD-10-CM

## 2024-07-11 DIAGNOSIS — J438 Other emphysema: Secondary | ICD-10-CM | POA: Insufficient documentation

## 2024-07-11 DIAGNOSIS — C3490 Malignant neoplasm of unspecified part of unspecified bronchus or lung: Secondary | ICD-10-CM

## 2024-07-11 LAB — CMP (CANCER CENTER ONLY)
ALT: 8 U/L (ref 0–44)
AST: 14 U/L — ABNORMAL LOW (ref 15–41)
Albumin: 3.9 g/dL (ref 3.5–5.0)
Alkaline Phosphatase: 65 U/L (ref 38–126)
Anion gap: 4 — ABNORMAL LOW (ref 5–15)
BUN: 18 mg/dL (ref 8–23)
CO2: 29 mmol/L (ref 22–32)
Calcium: 8.9 mg/dL (ref 8.9–10.3)
Chloride: 109 mmol/L (ref 98–111)
Creatinine: 1.33 mg/dL — ABNORMAL HIGH (ref 0.61–1.24)
GFR, Estimated: 53 mL/min — ABNORMAL LOW (ref >60)
Glucose, Bld: 135 mg/dL — ABNORMAL HIGH (ref 70–99)
Potassium: 4.3 mmol/L (ref 3.5–5.1)
Sodium: 142 mmol/L (ref 135–145)
Total Bilirubin: 0.6 mg/dL (ref 0.0–1.2)
Total Protein: 6.3 g/dL — ABNORMAL LOW (ref 6.5–8.1)

## 2024-07-11 LAB — CBC WITH DIFFERENTIAL (CANCER CENTER ONLY)
Abs Immature Granulocytes: 0.02 K/uL (ref 0.00–0.07)
Basophils Absolute: 0 K/uL (ref 0.0–0.1)
Basophils Relative: 1 %
Eosinophils Absolute: 0.1 K/uL (ref 0.0–0.5)
Eosinophils Relative: 1 %
HCT: 41.5 % (ref 39.0–52.0)
Hemoglobin: 13.6 g/dL (ref 13.0–17.0)
Immature Granulocytes: 0 %
Lymphocytes Relative: 22 %
Lymphs Abs: 1 K/uL (ref 0.7–4.0)
MCH: 28.9 pg (ref 26.0–34.0)
MCHC: 32.8 g/dL (ref 30.0–36.0)
MCV: 88.3 fL (ref 80.0–100.0)
Monocytes Absolute: 0.4 K/uL (ref 0.1–1.0)
Monocytes Relative: 8 %
Neutro Abs: 3.3 K/uL (ref 1.7–7.7)
Neutrophils Relative %: 68 %
Platelet Count: 143 K/uL — ABNORMAL LOW (ref 150–400)
RBC: 4.7 MIL/uL (ref 4.22–5.81)
RDW: 14 % (ref 11.5–15.5)
WBC Count: 4.8 K/uL (ref 4.0–10.5)
nRBC: 0 % (ref 0.0–0.2)

## 2024-07-11 NOTE — Progress Notes (Deleted)
 New Patient Pulmonology Office Visit   Subjective:  Patient ID: Edward [name], male    DOB: 11/18/1940  MRN: [id number]  Referred by: Sherrod Sherrod, MD  CC: No chief complaint on file.   HPI Edward [name] is a 83 y.o. male with diabetes mellitus, hypertension, dyslipidemia, GERD, nicotine dependence in remission who presents for initial evaluation of RLL lung mass.  Reason for Referral:   HPI:   Symptoms Associated with Lung cancer:   {Central Tumor Sx:33645}  {Peripheral Tumor Sx:33646}  {Sx of Metastasis:33647}  {Conditions associated with lung cancer & imp to identify prior to bronch:33648}  {STOPBANG:33649}  {Hx of Anesthesia reactions:33650}  PMH:   Important Medications:   Allergies:   Social History:  {Smoking and Biomass Fuel Exposure:33651}  {Occupational Exposures:33652}  {Military Specific Exposures:33653}  Family History: {Cancer-related QY:66345}  ASA grade:  {ASA GRADE:110003}  Karnofsky Performance Status: {Karnofsky Performance Status:33655}  ECOG Performance Status: {findings; ecog performance status:31780}   {PULM QUESTIONNAIRES (Optional):33196}  ROS  Allergies: Bystolic [nebivolol hcl]  Current Outpatient Medications:    APPLE CIDER VINEGAR PO, Take 1-2 tablets by mouth daily., Disp: , Rfl:    aspirin 81 MG tablet, Take 81 mg by mouth 3 (three) times a week., Disp: , Rfl:    atorvastatin  (LIPITOR) 40 MG tablet, TAKE 1 TABLET BY MOUTH IN THE EVENING FOR CHOLESTEROL, Disp: 270 tablet, Rfl: 0   Cholecalciferol (VITAMIN D  PO), Take 5,000 Units by mouth daily., Disp: , Rfl:    CINNAMON PO, Take 1-2 tablets by mouth daily., Disp: , Rfl:    Cyanocobalamin (VITAMIN B 12 PO), Take 1 tablet by mouth daily., Disp: , Rfl:    empagliflozin  (JARDIANCE ) 10 MG TABS tablet, Take 1 tablet (10 mg total) by mouth daily., Disp: 90 tablet, Rfl: 0   famotidine  (PEPCID ) 40 MG tablet, TAKE 1 TABLET BY MOUTH IN THE EVENING TO  PREVENT   HEARTBURN/INDIGESTION, Disp: 90 tablet, Rfl: 0   fluticasone  (FLONASE ) 50 MCG/ACT nasal spray, Place 1-2 sprays into both nostrils daily., Disp: 16 g, Rfl: 1   glucose blood (ONETOUCH VERIO) test strip, USE TO TEST BLOOD SUGAR ONCE DAILY, Disp: 100 strip, Rfl: 12   losartan  (COZAAR ) 50 MG tablet, Take 1 tablet (50 mg total) by mouth daily for blood pressure., Disp: 90 tablet, Rfl: 0   metFORMIN  (GLUCOPHAGE ) 500 MG tablet, TAKE 1 TABLET BY MOUTH THREE TIMES DAILY WITH MEALS FOR DIABETES, Disp: 270 tablet, Rfl: 0   OMEGA-3 FATTY ACIDS PO, Take 700 mg by mouth daily., Disp: , Rfl:    pantoprazole  (PROTONIX ) 40 MG tablet, Take 1 tablet (40 mg total) by mouth every evening to prevent heartburn and indigestion., Disp: 90 tablet, Rfl: 0   tamsulosin  (FLOMAX ) 0.4 MG CAPS capsule, TAKE 1 CAPSULE BY MOUTH IN THE EVENING FOR  PROSTATE, Disp: 90 capsule, Rfl: 0   tizanidine (ZANAFLEX) 2 MG capsule, Take 1 capsule (2 mg total) by mouth 2 (two) times daily as needed for muscle spasms., Disp: 30 capsule, Rfl: 0   zinc gluconate 50 MG tablet, Take 50 mg by mouth daily., Disp: , Rfl:  Past Medical History:  Diagnosis Date   BPH (benign prostatic hyperplasia)    Cataract    surgical removal bilateral   Diabetes mellitus without complication [facility])    ED (erectile dysfunction)    GERD (gastroesophageal reflux disease)    History of adenomatous polyp of colon 11/02/2017   Tubular adenoma 2013; recommended 5 year follow up  History of kidney stones    Hyperlipidemia    Hypertension    IBS (irritable bowel syndrome)    Kidney stones 02/11/2020   LBBB (left bundle branch block)    Personal history of kidney stones    Pre-diabetes    Prediabetes    Vitamin D  deficiency    Past Surgical History:  Procedure Laterality Date   CATARACT EXTRACTION Right 2015   CATARACT EXTRACTION W/ INTRAOCULAR LENS IMPLANT  2013   left   COLONOSCOPY  2013   TA   EXTRACORPOREAL SHOCK WAVE LITHOTRIPSY Left 12/11/2017    Procedure: LEFT EXTRACORPOREAL SHOCK WAVE LITHOTRIPSY (ESWL);  Surgeon: Carolee Sherwood JONETTA DOUGLAS, MD;  Location: [facility];  Service: Urology;  Laterality: Left;   KIDNEY STONE SURGERY     POLYPECTOMY     Family History  Problem Relation Age of Onset   Diabetes Sister    Hypertension Sister    Heart disease Mother    Diabetes Sister    Hypertension Sister    Colon cancer Neg Hx    Colon polyps Neg Hx    Esophageal cancer Neg Hx    Rectal cancer Neg Hx    Stomach cancer Neg Hx    Social History   Socioeconomic History   Marital status: Married    Spouse name: Not on file   Number of children: Not on file   Years of education: Not on file   Highest education level: Not on file  Occupational History   Not on file  Tobacco Use   Smoking status: Former    Current packs/day: 0.00    Average packs/day: 0.5 packs/day for 10.0 years (5.0 ttl pk-yrs)    Types: Cigarettes    Start date: 09/12/1957    Quit date: 09/13/1967    Years since quitting: 56.8    Passive exposure: Past   Smokeless tobacco: Never  Vaping Use   Vaping status: Never Used  Substance and Sexual Activity   Alcohol use: No   Drug use: No   Sexual activity: Not Currently  Other Topics Concern   Not on file  Social History Narrative   Married   Social Drivers of Health   Financial Resource Strain: Low Risk  (01/09/2024)   Overall Financial Resource Strain (CARDIA)    Difficulty of Paying Living Expenses: Not hard at all  Food Insecurity: No Food Insecurity (01/09/2024)   Hunger Vital Sign    Worried About Running Out of Food in the Last Year: Never true    Ran Out of Food in the Last Year: Never true  Transportation Needs: No Transportation Needs (01/09/2024)   PRAPARE - Administrator, Civil Service (Medical): No    Lack of Transportation (Non-Medical): No  Physical Activity: Sufficiently Active (01/09/2024)   Exercise Vital Sign    Days of Exercise per Week: 4 days    Minutes of Exercise per Session:  60 min  Stress: No Stress Concern Present (01/09/2024)   [employer] of Occupational Health - Occupational Stress Questionnaire    Feeling of Stress : Not at all  Social Connections: Socially Integrated (01/09/2024)   Social Connection and Isolation Panel    Frequency of Communication with Friends and Family: More than three times a week    Frequency of Social Gatherings with Friends and Family: More than three times a week    Attends Religious Services: More than 4 times per year    Active Member of [employer] or Organizations: Yes  Attends Banker Meetings: More than 4 times per year    Marital Status: Married  Catering Manager Violence: Not At Risk (01/09/2024)   Humiliation, Afraid, Rape, and Kick questionnaire    Fear of Current or Ex-Partner: No    Emotionally Abused: No    Physically Abused: No    Sexually Abused: No       Objective:  There were no vitals taken for this visit. {Pulm Vitals (Optional):32837}  Physical Exam  Diagnostic Review:  {Labs (Optional):32838}     Assessment & Plan:   Assessment & Plan   No orders of the defined types were placed in this encounter.     No follow-ups on file.   Dayshia Ballinas, MD

## 2024-07-11 NOTE — Progress Notes (Signed)
 [facility] [phone]   [phone]  CONSULT NOTE  REFERRING PHYSICIAN: Corean Cough, FNP  REASON FOR CONSULTATION:  83 years old white male with suspicious lung mass  HPI Edward [name] is a 83 y.o. male with past medical history significant for multiple medical problems including diabetes mellitus, hypertension, dyslipidemia, GERD, benign prostatic hypertrophy, irritable bowel syndrome, kidney stone, vitamin D  deficiency and history of smoking for around 10 years but quit September 13, 1967.   HPI  Discussed the use of AI scribe software for clinical note transcription with the patient, who gave verbal consent to proceed.  History of Present Illness Edward [name] is an 83 year old male who presents for evaluation of a right lung mass. He is accompanied by his wife, Edward [name], and his son, Edward [name]. He was referred by Madison Senters for evaluation of the lung mass.  The lung mass was discovered incidentally during an evaluation for right-sided pain after playing volleyball. Initially, the pain was attributed to kidney stones, but a subsequent x-ray on October 16 revealed a mass in the right infrahilar area of the lung. A follow-up CT scan of the chest, abdomen, and pelvis showed a 7.3 by 5.3 cm mass in the right hilar area.  He experiences coughing, especially with exertion, such as walking fast or carrying objects, but denies coughing up blood. No significant shortness of breath, headaches, vision changes, nausea, vomiting, or diarrhea, although he occasionally experiences loose stools, which he attributes to ice cream consumption. He has lost approximately 10 to 12 pounds recently.  His past medical history includes high blood pressure, diabetes, high cholesterol, kidney stones, irritable bowel syndrome, vitamin D  deficiency, and an enlarged prostate. He takes medication for high blood pressure daily. No history of heart attacks or  strokes.  Socially, he is retired from [employer] and remains active, playing volleyball regularly. He smoked for ten years, quitting in 1969, and denies alcohol or drug use. He is not allergic to any medications. His family history includes a sister with diabetes and heart disease. His father was healthy but died in an accident. His mother had heart disease.     Past Medical History:  Diagnosis Date   BPH (benign prostatic hyperplasia)    Cataract    surgical removal bilateral   Diabetes mellitus without complication (HCC)    ED (erectile dysfunction)    GERD (gastroesophageal reflux disease)    History of adenomatous polyp of colon 11/02/2017   Tubular adenoma 2013; recommended 5 year follow up   History of kidney stones    Hyperlipidemia    Hypertension    IBS (irritable bowel syndrome)    Kidney stones 02/11/2020   LBBB (left bundle branch block)    Personal history of kidney stones    Pre-diabetes    Prediabetes    Vitamin D  deficiency       Past Surgical History:  Procedure Laterality Date   CATARACT EXTRACTION Right 2015   CATARACT EXTRACTION W/ INTRAOCULAR LENS IMPLANT  2013   left   COLONOSCOPY  2013   TA   EXTRACORPOREAL SHOCK WAVE LITHOTRIPSY Left 12/11/2017   Procedure: LEFT EXTRACORPOREAL SHOCK WAVE LITHOTRIPSY (ESWL);  Surgeon: Carolee Sherwood JONETTA DOUGLAS, MD;  Location: [facility];  Service: Urology;  Laterality: Left;   KIDNEY STONE SURGERY     POLYPECTOMY      Family History  Problem Relation Age of Onset   Diabetes Sister    Hypertension Sister  Heart disease Mother    Diabetes Sister    Hypertension Sister    Colon cancer Neg Hx    Colon polyps Neg Hx    Esophageal cancer Neg Hx    Rectal cancer Neg Hx    Stomach cancer Neg Hx     Social History Social History   Tobacco Use   Smoking status: Former    Current packs/day: 0.00    Average packs/day: 0.5 packs/day for 10.0 years (5.0 ttl pk-yrs)    Types: Cigarettes    Start date: 09/12/1957     Quit date: 09/13/1967    Years since quitting: 56.8    Passive exposure: Past   Smokeless tobacco: Never  Vaping Use   Vaping status: Never Used  Substance Use Topics   Alcohol use: No   Drug use: No    Allergies  Allergen Reactions   Bystolic [Nebivolol Hcl]     Current Outpatient Medications  Medication Sig Dispense Refill   APPLE CIDER VINEGAR PO Take 1-2 tablets by mouth daily.     aspirin 81 MG tablet Take 81 mg by mouth 3 (three) times a week.     atorvastatin  (LIPITOR) 40 MG tablet TAKE 1 TABLET BY MOUTH IN THE EVENING FOR CHOLESTEROL 270 tablet 0   Cholecalciferol (VITAMIN D  PO) Take 5,000 Units by mouth daily.     CINNAMON PO Take 1-2 tablets by mouth daily.     Cyanocobalamin (VITAMIN B 12 PO) Take 1 tablet by mouth daily.     empagliflozin  (JARDIANCE ) 10 MG TABS tablet Take 1 tablet (10 mg total) by mouth daily. 90 tablet 0   famotidine  (PEPCID ) 40 MG tablet TAKE 1 TABLET BY MOUTH IN THE EVENING TO  PREVENT  HEARTBURN/INDIGESTION 90 tablet 0   fluticasone  (FLONASE ) 50 MCG/ACT nasal spray Place 1-2 sprays into both nostrils daily. 16 g 1   glucose blood (ONETOUCH VERIO) test strip USE TO TEST BLOOD SUGAR ONCE DAILY 100 strip 12   losartan  (COZAAR ) 50 MG tablet Take 1 tablet (50 mg total) by mouth daily for blood pressure. 90 tablet 0   metFORMIN  (GLUCOPHAGE ) 500 MG tablet TAKE 1 TABLET BY MOUTH THREE TIMES DAILY WITH MEALS FOR DIABETES 270 tablet 0   OMEGA-3 FATTY ACIDS PO Take 700 mg by mouth daily.     pantoprazole  (PROTONIX ) 40 MG tablet Take 1 tablet (40 mg total) by mouth every evening to prevent heartburn and indigestion. 90 tablet 0   tamsulosin  (FLOMAX ) 0.4 MG CAPS capsule TAKE 1 CAPSULE BY MOUTH IN THE EVENING FOR  PROSTATE 90 capsule 0   tizanidine (ZANAFLEX) 2 MG capsule Take 1 capsule (2 mg total) by mouth 2 (two) times daily as needed for muscle spasms. 30 capsule 0   zinc gluconate 50 MG tablet Take 50 mg by mouth daily.     No current facility-administered  medications for this visit.    Review of Systems  Constitutional: negative Eyes: negative Ears, nose, mouth, throat, and face: negative Respiratory: positive for cough Cardiovascular: negative Gastrointestinal: negative Genitourinary:negative Integument/breast: negative Hematologic/lymphatic: negative Musculoskeletal:negative Neurological: negative Behavioral/Psych: negative Endocrine: negative Allergic/Immunologic: negative  Physical Exam  MJO:jozmu, healthy, no distress, well nourished, and well developed SKIN: skin color, texture, turgor are normal, no rashes or significant lesions HEAD: Normocephalic, No masses, lesions, tenderness or abnormalities EYES: normal, PERRLA, Conjunctiva are pink and non-injected EARS: External ears normal, Canals clear OROPHARYNX:no exudate, no erythema, and lips, buccal mucosa, and tongue normal  NECK: supple, no adenopathy, no  JVD LYMPH:  no palpable lymphadenopathy, no hepatosplenomegaly LUNGS: clear to auscultation , and palpation HEART: regular rate & rhythm, no murmurs, and no gallops ABDOMEN:abdomen soft, non-tender, normal bowel sounds, and no masses or organomegaly BACK: Back symmetric, no curvature., No CVA tenderness EXTREMITIES:no joint deformities, effusion, or inflammation, no edema  NEURO: alert & oriented x 3 with fluent speech, no focal motor/sensory deficits  PERFORMANCE STATUS: ECOG 1  LABORATORY DATA: Lab Results  Component Value Date   WBC 4.8 07/11/2024   HGB 13.6 07/11/2024   HCT 41.5 07/11/2024   MCV 88.3 07/11/2024   PLT 143 (L) 07/11/2024      Chemistry      Component Value Date/Time   NA 142 07/11/2024 1303   K 4.3 07/11/2024 1303   CL 109 07/11/2024 1303   CO2 29 07/11/2024 1303   BUN 18 07/11/2024 1303   CREATININE 1.33 (H) 07/11/2024 1303   CREATININE 1.34 (H) 08/22/2023 1156      Component Value Date/Time   CALCIUM  8.9 07/11/2024 1303   ALKPHOS 65 07/11/2024 1303   AST 14 (L) 07/11/2024 1303    ALT 8 07/11/2024 1303   BILITOT 0.6 07/11/2024 1303       RADIOGRAPHIC STUDIES: CT CHEST ABDOMEN PELVIS W CONTRAST Result Date: 07/02/2024 CLINICAL DATA:  Abdominal pain, right hilar mass suspected by prior chest radiographs * Tracking Code: BO * EXAM: CT CHEST, ABDOMEN, AND PELVIS WITH CONTRAST TECHNIQUE: Multidetector CT imaging of the chest, abdomen and pelvis was performed following the standard protocol during bolus administration of intravenous contrast. RADIATION DOSE REDUCTION: This exam was performed according to the departmental dose-optimization program which includes automated exposure control, adjustment of the mA and/or kV according to patient size and/or use of iterative reconstruction technique. CONTRAST:  ISOVUE-300 IOPAMIDOL (ISOVUE-300) INJECTION 61% COMPARISON:  Chest and rib radiographs, 06/27/2024 FINDINGS: CT CHEST FINDINGS Cardiovascular: No significant vascular findings. Cardiomegaly. Left coronary artery calcifications. Small pericardial effusion. Marked narrowing of the right lower lobe and right middle lobe pulmonary arteries due to right hilar mass effect. Mediastinum/Nodes: Right hilar mass as detailed below; no other enlarged mediastinal lymph nodes. No enlarged mediastinal, hilar, or axillary lymph nodes. Thyroid  gland, trachea, and esophagus demonstrate no significant findings. Lungs/Pleura: Lobulated mass of the inferior right hilum, with frank occlusion of the right middle lobe bronchi and marked narrowing of the right lower lobar and proximal segmental airways mass measuring approximately 7.3 x 5.3 cm (series 4, image 43). Resultant, near complete postobstructive atelectasis of the right middle lobe. Minimal predominantly paraseptal emphysema. Diffuse bilateral bronchial wall thickening. Small right pleural effusion. Musculoskeletal: No chest wall abnormality. No acute osseous findings. CT ABDOMEN PELVIS FINDINGS Hepatobiliary: No solid liver abnormality is seen.  No gallstones, gallbladder wall thickening, or biliary dilatation. Pancreas: Unremarkable. No pancreatic ductal dilatation or surrounding inflammatory changes. Spleen: Normal in size without significant abnormality. Adrenals/Urinary Tract: Adrenal glands are unremarkable. Small nonobstructive calculus of the inferior pole of the right kidney no left-sided calculi or hydronephrosis simple, benign bilateral renal cortical and parapelvic cysts for which no specific further follow-up or characterization is required. Calculus in the dependent midline bladder measuring 0.7 cm, as well as multiple additional tiny calculi and gravel (series 4, image 113) Stomach/Bowel: Stomach is within normal limits. Appendix appears normal. No evidence of bowel wall thickening, distention, or inflammatory changes. Sigmoid diverticulosis. Vascular/Lymphatic: No significant vascular findings are present. No enlarged abdominal or pelvic lymph nodes. Reproductive: Prostatomegaly. Other: No small fat containing left inguinal hernia.  No ascites. Musculoskeletal: No acute osseous findings. Chronic bilateral pars defects of L5 with degenerative anterolisthesis of L5 on S1. IMPRESSION: 1. Large, lobular mass of the inferior right hilum, with frank occlusion of the right middle lobe bronchi and marked narrowing of the right lower lobar and proximal segmental airways. Findings are consistent with primary lung malignancy. 2. Mass is inseparable from the right hilum; no other evidence of mediastinal lymphadenopathy nor distant metastatic disease in the chest, abdomen, or pelvis. 3. Small right pleural effusion. 4. Cardiomegaly and coronary artery disease. 5. Nonobstructive right nephrolithiasis.  Multiple bladder calculi. These results will be called to the ordering clinician or representative by the Radiologist Assistant, and communication documented in the PACS or [employer]. Emphysema (ICD10-J43.9). Electronically Signed   By: Marolyn JONETTA Jaksch  M.D.   On: 07/02/2024 15:09   DG Ribs Unilateral W/Chest Right Result Date: 06/27/2024 EXAM: AP VIEW(S) XRAY OF THE RIGHT RIBS AND CHEST 06/27/2024 11:02:13 AM COMPARISON: None available. CLINICAL HISTORY: right rib pain. Right anterior/axillary rib pain after reaching behind while riding a lawnmower x 1 week. FINDINGS: BONES: No acute displaced rib fracture. LUNGS AND PLEURA: Right infrahilar prominence is noted concerning for mass or malignancy. CT scan of the chest with intravenous contrast is recommended for further evaluation. Small right pleural effusion is noted. Minimal right basilar sepsis atelectasis or scarring. No pneumothorax. HEART AND MEDIASTINUM: No acute abnormality of the cardiac and mediastinal silhouettes. IMPRESSION: 1. Right infrahilar prominence suspicious for mass; recommend chest CT with IV contrast for further evaluation. 2. Small right pleural effusion. 3. Minimal right basilar atelectasis or scarring. Electronically signed by: Lynwood Seip MD 06/27/2024 11:18 AM EDT RP Workstation: [id number]    ASSESSMENT: This is a very pleasant 83 years old white male with highly suspicious stage IIIa (T4, N1, M0) lung cancer pending tissue diagnosis and fully staging workup presented with right suprahilar mass with occlusion of the right middle lobe and constriction of the right lower lobe bronchus.   PLAN: I had a lengthy discussion with the patient and his family today about his current disease and further investigation to confirm his diagnosis as well as the staging workup. I personally independently reviewed the imaging studies and discussed the result with the patient and his family. Assessment and Plan Assessment & Plan Right lung mass, suspected malignancy A 7.3 x 5.3 cm mass in the right hilar area is obstructing the right middle lobe and narrowing the right lower lobe bronchial tree, causing coughing and exertional dyspnea. The mass is suspected to be malignant, likely stage III  lung cancer, but a biopsy is needed for confirmation. Differential diagnosis includes other lung pathologies, but malignancy is highly suspected. He has a history of smoking, which increases the risk of lung cancer. Surgery is unlikely due to the central location and obstruction of the mass. Treatment options may include chemotherapy, radiation, and potential oral medications if mutations are found in the tumor. - Ordered PET scan to assess the extent of the disease. - Ordered MRI brain to check for metastasis. - Referred to pulmonologist for bronchoscopy and biopsy of the mass. - Advised to report any increase in dyspnea or hemoptysis immediately for potential radiation intervention. - Scheduled follow-up appointment in three weeks to discuss further treatment options based on test results. The patient was advised to call immediately if he has any concerning symptoms in the [place].  The patient voices understanding of current disease status and treatment options and is in agreement with the current  care plan.  All questions were answered. The patient knows to call the clinic with any problems, questions or concerns. We can certainly see the patient much sooner if necessary.  Thank you so much for allowing me to participate in the care of Santez R Moen. I will continue to follow up the patient with you and assist in his care.  The total time spent in the appointment was 90 minutes including review of chart and various tests results, discussions about plan of care and coordination of care plan .   Disclaimer: This note was dictated with voice recognition software. Similar sounding words can inadvertently be transcribed and may not be corrected upon review.   Sherrod MARLA Sherrod July 11, 2024, 2:34 PM

## 2024-07-15 ENCOUNTER — Encounter (HOSPITAL_BASED_OUTPATIENT_CLINIC_OR_DEPARTMENT_OTHER): Payer: Self-pay | Admitting: Pulmonary Disease

## 2024-07-15 ENCOUNTER — Telehealth: Payer: Self-pay | Admitting: Pulmonary Disease

## 2024-07-15 ENCOUNTER — Other Ambulatory Visit: Payer: Self-pay | Admitting: Family Medicine

## 2024-07-15 ENCOUNTER — Ambulatory Visit (HOSPITAL_BASED_OUTPATIENT_CLINIC_OR_DEPARTMENT_OTHER): Admitting: Pulmonary Disease

## 2024-07-15 ENCOUNTER — Encounter: Payer: Self-pay | Admitting: Pulmonary Disease

## 2024-07-15 VITALS — BP 132/69 | HR 62 | Ht 69.0 in | Wt 162.5 lb

## 2024-07-15 DIAGNOSIS — R918 Other nonspecific abnormal finding of lung field: Secondary | ICD-10-CM | POA: Diagnosis not present

## 2024-07-15 DIAGNOSIS — E119 Type 2 diabetes mellitus without complications: Secondary | ICD-10-CM | POA: Diagnosis not present

## 2024-07-15 DIAGNOSIS — K219 Gastro-esophageal reflux disease without esophagitis: Secondary | ICD-10-CM

## 2024-07-15 DIAGNOSIS — Z87891 Personal history of nicotine dependence: Secondary | ICD-10-CM | POA: Diagnosis not present

## 2024-07-15 NOTE — Patient Instructions (Signed)
  VISIT SUMMARY: Today, we discussed your persistent cough and weight loss, and reviewed the findings of a lung mass that was discovered during an evaluation for side pain. We have planned further tests and procedures to understand the nature of the lung mass and to manage your symptoms.  YOUR PLAN: RIGHT LUNG MASS: A large mass in your right lung, which is likely malignant and causing airway obstruction and symptoms. -You are scheduled for a lung mass biopsy on Thursday at [facility]. -Stop taking aspirin this week before the procedure. -Fast from midnight before the procedure. -Arrange for transportation after the procedure due to sedation. -You are scheduled for a PET scan and brain MRI on November 10th at [facility]. -A baseline pulmonary function test has been ordered. -Follow-up in two weeks after the biopsy to discuss the results.  TYPE 2 DIABETES MELLITUS: Your diabetes is currently managed with metformin . -Continue taking metformin  as prescribed. No changes to your medication are needed before the procedure.   Contains text generated by [employer].

## 2024-07-15 NOTE — H&P (View-Only) (Signed)
 New Patient Pulmonology Office Visit   Subjective:  Patient ID: Edward [name], male    DOB: [birth year]  MRN: [id number]  Referred by: Sherrod Sherrod, MD  CC:  Chief Complaint  Patient presents with   Consult    HPI Edward [name] is a 83 y.o. male with DM, HTN, HLD, and GERD who presents for initial evaluation of R lung mass.  Discussed the use of AI scribe software for clinical note transcription with the patient, who gave verbal consent to proceed.  History of Present Illness Edward [name] is an 83 year old male with a lung mass who presents with persistent cough and weight loss.  A lung mass was discovered incidentally during an evaluation for severe side pain, initially suspected to be due to kidney stones. An x-ray on July 02, 2024, revealed the mass, and a subsequent CT scan confirmed its presence, measuring approximately 6 cm by 5 cm, located on the right side and partially obstructing an airway.  He has experienced a persistent cough for the past few months, which worsens with physical exertion or touching certain areas. No shortness of breath unless exerting himself, and no wheezing, phlegm production, or hemoptysis.  He has had an unintentional weight loss of 10 to 12 pounds, attributed to a decreased appetite. No fevers, chills, or night sweats.  His past medical history includes diabetes, managed with metformin , and a history of kidney stones. 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 He also has a history of exposure to tobacco and wood smoke. Family history is notable for a mother who lived to 56 with dementia, a sister with diabetes, and no known family history of cancer.  Symptoms Associated with Lung cancer:   Central Tumor Sx: Dyspnea and Cough  Peripheral Tumor Sx: Cough and Dyspnea  Sx of Metastasis: Weight  loss   Hx of anesthesia reactions: none  PMH:  - DM - Kidney Stones - HTN - HLD  Important Medications: Aspirin 81 mg three times weekly  Allergies: no latex or other allergies  Social History:  Smoking and Biomass Fuel Exposure: Tobacco Smoking 8 PY  Occupational Exposures: Asbestos - holiday representative workers, social research officer, government, therapist, sports, railroad, Customer Service Manager exposures  Family History: no lung or other cancer in family  ASA grade:  ASA 2 - Patient with mild systemic disease with no functional limitations  Karnofsky Performance Status: 90 Able to carry on normal activity, minor signs, or symptoms of disease  ECOG Performance Status: (1) Restricted in physically strenuous activity, ambulatory and able to do work of light nature  ROS  Allergies: Bystolic [nebivolol hcl]  Current Outpatient Medications:    APPLE CIDER VINEGAR PO, Take 1-2 tablets by mouth daily., Disp: , Rfl:    aspirin 81 MG tablet, Take 81 mg by mouth 3 (three) times a week., Disp: , Rfl:    atorvastatin  (LIPITOR) 40 MG tablet, TAKE 1 TABLET BY MOUTH IN THE EVENING FOR CHOLESTEROL, Disp: 270 tablet, Rfl: 0   Cholecalciferol (VITAMIN D  PO), Take 5,000 Units by mouth daily., Disp: , Rfl:    CINNAMON PO, Take 1-2 tablets by mouth daily., Disp: , Rfl:    Cyanocobalamin (VITAMIN B 12 PO), Take 1 tablet by mouth daily., Disp: , Rfl:    empagliflozin  (JARDIANCE ) 10 MG TABS tablet, Take 1 tablet (10  mg total) by mouth daily., Disp: 90 tablet, Rfl: 0   famotidine  (PEPCID ) 40 MG tablet, TAKE 1 TABLET BY MOUTH IN THE EVENING TO  PREVENT  HEARTBURN/INDIGESTION, Disp: 90 tablet, Rfl: 0   fluticasone  (FLONASE ) 50 MCG/ACT nasal spray, Place 1-2 sprays into both nostrils daily., Disp: 16 g, Rfl: 1   glucose blood (ONETOUCH VERIO) test strip, USE TO TEST BLOOD SUGAR ONCE DAILY, Disp: 100 strip, Rfl: 12   losartan  (COZAAR ) 50 MG tablet, Take 1 tablet (50 mg total) by mouth daily for blood pressure., Disp: 90  tablet, Rfl: 0   metFORMIN  (GLUCOPHAGE ) 500 MG tablet, TAKE 1 TABLET BY MOUTH THREE TIMES DAILY WITH MEALS FOR DIABETES, Disp: 270 tablet, Rfl: 0   OMEGA-3 FATTY ACIDS PO, Take 700 mg by mouth daily., Disp: , Rfl:    pantoprazole  (PROTONIX ) 40 MG tablet, Take 1 tablet (40 mg total) by mouth every evening to prevent heartburn and indigestion., Disp: 90 tablet, Rfl: 0   tamsulosin  (FLOMAX ) 0.4 MG CAPS capsule, TAKE 1 CAPSULE BY MOUTH IN THE EVENING FOR  PROSTATE, Disp: 90 capsule, Rfl: 0   tizanidine (ZANAFLEX) 2 MG capsule, Take 1 capsule (2 mg total) by mouth 2 (two) times daily as needed for muscle spasms., Disp: 30 capsule, Rfl: 0   zinc gluconate 50 MG tablet, Take 50 mg by mouth daily., Disp: , Rfl:  Past Medical History:  Diagnosis Date   BPH (benign prostatic hyperplasia)    Cataract    surgical removal bilateral   Diabetes mellitus without complication (HCC)    ED (erectile dysfunction)    GERD (gastroesophageal reflux disease)    History of adenomatous polyp of colon 11/02/2017   Tubular adenoma 2013; recommended 5 year follow up   History of kidney stones    Hyperlipidemia    Hypertension    IBS (irritable bowel syndrome)    Kidney stones 02/11/2020   LBBB (left bundle branch block)    Personal history of kidney stones    Pre-diabetes    Prediabetes    Vitamin D  deficiency    Past Surgical History:  Procedure Laterality Date   CATARACT EXTRACTION Right 2015   CATARACT EXTRACTION W/ INTRAOCULAR LENS IMPLANT  2013   left   COLONOSCOPY  2013   TA   EXTRACORPOREAL SHOCK WAVE LITHOTRIPSY Left 12/11/2017   Procedure: LEFT EXTRACORPOREAL SHOCK WAVE LITHOTRIPSY (ESWL);  Surgeon: Carolee Sherwood JONETTA DOUGLAS, MD;  Location: [facility];  Service: Urology;  Laterality: Left;   KIDNEY STONE SURGERY     POLYPECTOMY     Family History  Problem Relation Age of Onset   Diabetes Sister    Hypertension Sister    Heart disease Mother    Diabetes Sister    Hypertension Sister    Colon cancer Neg Hx     Colon polyps Neg Hx    Esophageal cancer Neg Hx    Rectal cancer Neg Hx    Stomach cancer Neg Hx    Social History   Socioeconomic History   Marital status: Married    Spouse name: Not on file   Number of children: Not on file   Years of education: Not on file   Highest education level: Not on file  Occupational History   Not on file  Tobacco Use   Smoking status: Former    Current packs/day: 0.00    Average packs/day: 0.5 packs/day for 10.0 years (5.0 ttl pk-yrs)    Types: Cigarettes    Start date:  09/12/1957    Quit date: 09/13/1967    Years since quitting: 56.8    Passive exposure: Past   Smokeless tobacco: Never  Vaping Use   Vaping status: Never Used  Substance and Sexual Activity   Alcohol use: No   Drug use: No   Sexual activity: Not Currently  Other Topics Concern   Not on file  Social History Narrative   Married   Social Drivers of Health   Financial Resource Strain: Low Risk  (01/09/2024)   Overall Financial Resource Strain (CARDIA)    Difficulty of Paying Living Expenses: Not hard at all  Food Insecurity: No Food Insecurity (01/09/2024)   Hunger Vital Sign    Worried About Running Out of Food in the Last Year: Never true    Ran Out of Food in the Last Year: Never true  Transportation Needs: No Transportation Needs (01/09/2024)   PRAPARE - Administrator, Civil Service (Medical): No    Lack of Transportation (Non-Medical): No  Physical Activity: Sufficiently Active (01/09/2024)   Exercise Vital Sign    Days of Exercise per Week: 4 days    Minutes of Exercise per Session: 60 min  Stress: No Stress Concern Present (01/09/2024)   [employer] of Occupational Health - Occupational Stress Questionnaire    Feeling of Stress : Not at all  Social Connections: Socially Integrated (01/09/2024)   Social Connection and Isolation Panel    Frequency of Communication with Friends and Family: More than three times a week    Frequency of Social Gatherings  with Friends and Family: More than three times a week    Attends Religious Services: More than 4 times per year    Active Member of [employer] or Organizations: Yes    Attends Engineer, Structural: More than 4 times per year    Marital Status: Married  Catering Manager Violence: Not At Risk (01/09/2024)   Humiliation, Afraid, Rape, and Kick questionnaire    Fear of Current or Ex-Partner: No    Emotionally Abused: No    Physically Abused: No    Sexually Abused: No       Objective:  BP 132/69   Pulse 62   Ht 5' 9 (1.753 m)   Wt 162 lb 8 oz (73.7 kg)   SpO2 94%   BMI 24.00 kg/m  Wt Readings from Last 3 Encounters:  07/15/24 162 lb 8 oz (73.7 kg)  07/11/24 163 lb (73.9 kg)  06/27/24 161 lb 12.8 oz (73.4 kg)   BMI Readings from Last 3 Encounters:  07/15/24 24.00 kg/m  07/11/24 24.07 kg/m  06/27/24 26.92 kg/m   SpO2 Readings from Last 3 Encounters:  07/15/24 94%  07/11/24 96%  06/27/24 96%   Physical Exam General: NAD, alert, WD, WN Eyes: PERRL, no scleral icterus ENMT: oropharynx clear, good dentition, no oral lesions, mallampati score III Skin: warm, intact, no rashes Neck: JVD flat, ROM and lymph node assessment normal CV: RRR, no MRG, nl S1 and S2, no peripheral edema Resp: clear to auscultation bilaterally, no wheezes, rales, or rhonchi, normal effort, no clubbing/cyanosis Neuro: Awake alert oriented to person place time and situation  Diagnostic Review:  Last CBC Lab Results  Component Value Date   WBC 4.8 07/11/2024   HGB 13.6 07/11/2024   HCT 41.5 07/11/2024   MCV 88.3 07/11/2024   MCH 28.9 07/11/2024   RDW 14.0 07/11/2024   PLT 143 (L) 07/11/2024   Last metabolic panel Lab Results  Component  Value Date   GLUCOSE 135 (H) 07/11/2024   NA 142 07/11/2024   K 4.3 07/11/2024   CL 109 07/11/2024   CO2 29 07/11/2024   BUN 18 07/11/2024   CREATININE 1.33 (H) 07/11/2024   GFRNONAA 53 (L) 07/11/2024   CALCIUM  8.9 07/11/2024   PROT 6.3 (L)  07/11/2024   ALBUMIN 3.9 07/11/2024   BILITOT 0.6 07/11/2024   ALKPHOS 65 07/11/2024   AST 14 (L) 07/11/2024   ALT 8 07/11/2024   ANIONGAP 4 (L) 07/11/2024    CT CHEST/ABD/PLV 07/02/2024: IMPRESSION: 1. Large, lobular mass of the inferior right hilum, with frank occlusion of the right middle lobe bronchi and marked narrowing of the right lower lobar and proximal segmental airways. Findings are consistent with primary lung malignancy. 2. Mass is inseparable from the right hilum; no other evidence of mediastinal lymphadenopathy nor distant metastatic disease in the chest, abdomen, or pelvis. 3. Small right pleural effusion. 4. Cardiomegaly and coronary artery disease. 5. Nonobstructive right nephrolithiasis.  Multiple bladder calculi.    Assessment & Plan:   Assessment & Plan Right lower lobe lung mass Right lung mass suspicious for malignancy Large right lung mass, 7x5 cm, likely malignant, causing airway obstruction and symptoms. Differential includes lung cancer. Further evaluation required. - Scheduled lung mass biopsy on Thursday at [facility]. - Instructed to stop aspirin this week before procedure. - Instructed to fast from midnight before procedure. - Instructed to arrange transportation post-procedure due to sedation. - Scheduled PET scan and brain MRI on November 10th at [facility]. - Ordered baseline pulmonary function test. - Follow-up in two weeks post-biopsy to discuss results.  Type 2 diabetes mellitus Managed with metformin . No medication changes needed before procedure. - Continue metformin  as prescribed.  Orders Placed This Encounter  Procedures   Procedural/ Surgical Case Request: ENDOBRONCHIAL ULTRASOUND (EBUS)   Pulmonary function test   I spent 45 minutes reviewing patient's chart including prior consultant notes, imaging, and PFTs as well as face-to-face with the patient, over half in discussion of the diagnosis and the  importance of compliance with the treatment plan.  Return in about 4 weeks (around 08/12/2024).   Genella Bas, MD

## 2024-07-15 NOTE — Telephone Encounter (Signed)
 Please schedule the following:  Provider performing procedure:Dorthy Hustead Diagnosis: Lung mass Which side if for nodule / mass? Right Procedure: EBUS  Has patient been spoken to by Provider and given informed consent? Yes Anesthesia: General Do you need Fluro? NO Duration of procedure: 1.5 hours Date: 07/18/2024  Alternate Date: N/A Time: Any Location: [facility] Does patient have OSA? No DM? Yes Or Latex allergy? No Medication Restriction/ Anticoagulate/Antiplatelet: Aspirin three times a week, patient held it Pre-op Labs Ordered:determined by Anesthesia Imaging request: N/A  (If, SuperDimension CT Chest, please have STAT courier sent to ENDO)

## 2024-07-15 NOTE — Progress Notes (Signed)
 New Patient Pulmonology Office Visit   Subjective:  Patient ID: Edward [name], male    DOB: [birth year]  MRN: [id number]  Referred by: Sherrod Sherrod, MD  CC:  Chief Complaint  Patient presents with   Consult    HPI Edward [name] is a 83 y.o. male with DM, HTN, HLD, and GERD who presents for initial evaluation of R lung mass.  Discussed the use of AI scribe software for clinical note transcription with the patient, who gave verbal consent to proceed.  History of Present Illness Edward [name] is an 83 year old male with a lung mass who presents with persistent cough and weight loss.  A lung mass was discovered incidentally during an evaluation for severe side pain, initially suspected to be due to kidney stones. An x-ray on July 02, 2024, revealed the mass, and a subsequent CT scan confirmed its presence, measuring approximately 6 cm by 5 cm, located on the right side and partially obstructing an airway.  He has experienced a persistent cough for the past few months, which worsens with physical exertion or touching certain areas. No shortness of breath unless exerting himself, and no wheezing, phlegm production, or hemoptysis.  He has had an unintentional weight loss of 10 to 12 pounds, attributed to a decreased appetite. No fevers, chills, or night sweats.  His past medical history includes diabetes, managed with metformin , and a history of kidney stones. 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 He also has a history of exposure to tobacco and wood smoke. Family history is notable for a mother who lived to 56 with dementia, a sister with diabetes, and no known family history of cancer.  Symptoms Associated with Lung cancer:   Central Tumor Sx: Dyspnea and Cough  Peripheral Tumor Sx: Cough and Dyspnea  Sx of Metastasis: Weight  loss   Hx of anesthesia reactions: none  PMH:  - DM - Kidney Stones - HTN - HLD  Important Medications: Aspirin 81 mg three times weekly  Allergies: no latex or other allergies  Social History:  Smoking and [employer] Exposure: Tobacco Smoking 8 PY  Occupational Exposures: Asbestos - holiday representative workers, social research officer, government, therapist, sports, railroad, Customer Service Manager exposures  Family History: no lung or other cancer in family  ASA grade:  ASA 2 - Patient with mild systemic disease with no functional limitations  Karnofsky Performance Status: 90 Able to carry on normal activity, minor signs, or symptoms of disease  ECOG Performance Status: (1) Restricted in physically strenuous activity, ambulatory and able to do work of light nature  ROS  Allergies: Bystolic [nebivolol hcl]  Current Outpatient Medications:    APPLE CIDER VINEGAR PO, Take 1-2 tablets by mouth daily., Disp: , Rfl:    aspirin 81 MG tablet, Take 81 mg by mouth 3 (three) times a week., Disp: , Rfl:    atorvastatin  (LIPITOR) 40 MG tablet, TAKE 1 TABLET BY MOUTH IN THE EVENING FOR CHOLESTEROL, Disp: 270 tablet, Rfl: 0   Cholecalciferol (VITAMIN D  PO), Take 5,000 Units by mouth daily., Disp: , Rfl:    CINNAMON PO, Take 1-2 tablets by mouth daily., Disp: , Rfl:    Cyanocobalamin (VITAMIN B 12 PO), Take 1 tablet by mouth daily., Disp: , Rfl:    empagliflozin  (JARDIANCE ) 10 MG TABS tablet, Take 1 tablet (10  mg total) by mouth daily., Disp: 90 tablet, Rfl: 0   famotidine  (PEPCID ) 40 MG tablet, TAKE 1 TABLET BY MOUTH IN THE EVENING TO  PREVENT  HEARTBURN/INDIGESTION, Disp: 90 tablet, Rfl: 0   fluticasone  (FLONASE ) 50 MCG/ACT nasal spray, Place 1-2 sprays into both nostrils daily., Disp: 16 g, Rfl: 1   glucose blood (ONETOUCH VERIO) test strip, USE TO TEST BLOOD SUGAR ONCE DAILY, Disp: 100 strip, Rfl: 12   losartan  (COZAAR ) 50 MG tablet, Take 1 tablet (50 mg total) by mouth daily for blood pressure., Disp: 90  tablet, Rfl: 0   metFORMIN  (GLUCOPHAGE ) 500 MG tablet, TAKE 1 TABLET BY MOUTH THREE TIMES DAILY WITH MEALS FOR DIABETES, Disp: 270 tablet, Rfl: 0   OMEGA-3 FATTY ACIDS PO, Take 700 mg by mouth daily., Disp: , Rfl:    pantoprazole  (PROTONIX ) 40 MG tablet, Take 1 tablet (40 mg total) by mouth every evening to prevent heartburn and indigestion., Disp: 90 tablet, Rfl: 0   tamsulosin  (FLOMAX ) 0.4 MG CAPS capsule, TAKE 1 CAPSULE BY MOUTH IN THE EVENING FOR  PROSTATE, Disp: 90 capsule, Rfl: 0   tizanidine (ZANAFLEX) 2 MG capsule, Take 1 capsule (2 mg total) by mouth 2 (two) times daily as needed for muscle spasms., Disp: 30 capsule, Rfl: 0   zinc gluconate 50 MG tablet, Take 50 mg by mouth daily., Disp: , Rfl:  Past Medical History:  Diagnosis Date   BPH (benign prostatic hyperplasia)    Cataract    surgical removal bilateral   Diabetes mellitus without complication (HCC)    ED (erectile dysfunction)    GERD (gastroesophageal reflux disease)    History of adenomatous polyp of colon 11/02/2017   Tubular adenoma 2013; recommended 5 year follow up   History of kidney stones    Hyperlipidemia    Hypertension    IBS (irritable bowel syndrome)    Kidney stones 02/11/2020   LBBB (left bundle branch block)    Personal history of kidney stones    Pre-diabetes    Prediabetes    Vitamin D  deficiency    Past Surgical History:  Procedure Laterality Date   CATARACT EXTRACTION Right 2015   CATARACT EXTRACTION W/ INTRAOCULAR LENS IMPLANT  2013   left   COLONOSCOPY  2013   TA   EXTRACORPOREAL SHOCK WAVE LITHOTRIPSY Left 12/11/2017   Procedure: LEFT EXTRACORPOREAL SHOCK WAVE LITHOTRIPSY (ESWL);  Surgeon: Carolee Sherwood JONETTA DOUGLAS, MD;  Location: [facility];  Service: Urology;  Laterality: Left;   KIDNEY STONE SURGERY     POLYPECTOMY     Family History  Problem Relation Age of Onset   Diabetes Sister    Hypertension Sister    Heart disease Mother    Diabetes Sister    Hypertension Sister    Colon cancer Neg Hx     Colon polyps Neg Hx    Esophageal cancer Neg Hx    Rectal cancer Neg Hx    Stomach cancer Neg Hx    Social History   Socioeconomic History   Marital status: Married    Spouse name: Not on file   Number of children: Not on file   Years of education: Not on file   Highest education level: Not on file  Occupational History   Not on file  Tobacco Use   Smoking status: Former    Current packs/day: 0.00    Average packs/day: 0.5 packs/day for 10.0 years (5.0 ttl pk-yrs)    Types: Cigarettes    Start date:  09/12/1957    Quit date: 09/13/1967    Years since quitting: 56.8    Passive exposure: Past   Smokeless tobacco: Never  Vaping Use   Vaping status: Never Used  Substance and Sexual Activity   Alcohol use: No   Drug use: No   Sexual activity: Not Currently  Other Topics Concern   Not on file  Social History Narrative   Married   Social Drivers of Health   Financial Resource Strain: Low Risk  (01/09/2024)   Overall Financial Resource Strain (CARDIA)    Difficulty of Paying Living Expenses: Not hard at all  Food Insecurity: No Food Insecurity (01/09/2024)   Hunger Vital Sign    Worried About Running Out of Food in the Last Year: Never true    Ran Out of Food in the Last Year: Never true  Transportation Needs: No Transportation Needs (01/09/2024)   PRAPARE - Administrator, Civil Service (Medical): No    Lack of Transportation (Non-Medical): No  Physical Activity: Sufficiently Active (01/09/2024)   Exercise Vital Sign    Days of Exercise per Week: 4 days    Minutes of Exercise per Session: 60 min  Stress: No Stress Concern Present (01/09/2024)   [employer] of Occupational Health - Occupational Stress Questionnaire    Feeling of Stress : Not at all  Social Connections: Socially Integrated (01/09/2024)   Social Connection and Isolation Panel    Frequency of Communication with Friends and Family: More than three times a week    Frequency of Social Gatherings  with Friends and Family: More than three times a week    Attends Religious Services: More than 4 times per year    Active Member of [employer] or Organizations: Yes    Attends Engineer, Structural: More than 4 times per year    Marital Status: Married  Catering Manager Violence: Not At Risk (01/09/2024)   Humiliation, Afraid, Rape, and Kick questionnaire    Fear of Current or Ex-Partner: No    Emotionally Abused: No    Physically Abused: No    Sexually Abused: No       Objective:  BP 132/69   Pulse 62   Ht 5' 9 (1.753 m)   Wt 162 lb 8 oz (73.7 kg)   SpO2 94%   BMI 24.00 kg/m  Wt Readings from Last 3 Encounters:  07/15/24 162 lb 8 oz (73.7 kg)  07/11/24 163 lb (73.9 kg)  06/27/24 161 lb 12.8 oz (73.4 kg)   BMI Readings from Last 3 Encounters:  07/15/24 24.00 kg/m  07/11/24 24.07 kg/m  06/27/24 26.92 kg/m   SpO2 Readings from Last 3 Encounters:  07/15/24 94%  07/11/24 96%  06/27/24 96%   Physical Exam General: NAD, alert, WD, WN Eyes: PERRL, no scleral icterus ENMT: oropharynx clear, good dentition, no oral lesions, mallampati score III Skin: warm, intact, no rashes Neck: JVD flat, ROM and lymph node assessment normal CV: RRR, no MRG, nl S1 and S2, no peripheral edema Resp: clear to auscultation bilaterally, no wheezes, rales, or rhonchi, normal effort, no clubbing/cyanosis Neuro: Awake alert oriented to person place time and situation  Diagnostic Review:  Last CBC Lab Results  Component Value Date   WBC 4.8 07/11/2024   HGB 13.6 07/11/2024   HCT 41.5 07/11/2024   MCV 88.3 07/11/2024   MCH 28.9 07/11/2024   RDW 14.0 07/11/2024   PLT 143 (L) 07/11/2024   Last metabolic panel Lab Results  Component  Value Date   GLUCOSE 135 (H) 07/11/2024   NA 142 07/11/2024   K 4.3 07/11/2024   CL 109 07/11/2024   CO2 29 07/11/2024   BUN 18 07/11/2024   CREATININE 1.33 (H) 07/11/2024   GFRNONAA 53 (L) 07/11/2024   CALCIUM  8.9 07/11/2024   PROT 6.3 (L)  07/11/2024   ALBUMIN 3.9 07/11/2024   BILITOT 0.6 07/11/2024   ALKPHOS 65 07/11/2024   AST 14 (L) 07/11/2024   ALT 8 07/11/2024   ANIONGAP 4 (L) 07/11/2024    CT CHEST/ABD/PLV 07/02/2024: IMPRESSION: 1. Large, lobular mass of the inferior right hilum, with frank occlusion of the right middle lobe bronchi and marked narrowing of the right lower lobar and proximal segmental airways. Findings are consistent with primary lung malignancy. 2. Mass is inseparable from the right hilum; no other evidence of mediastinal lymphadenopathy nor distant metastatic disease in the chest, abdomen, or pelvis. 3. Small right pleural effusion. 4. Cardiomegaly and coronary artery disease. 5. Nonobstructive right nephrolithiasis.  Multiple bladder calculi.    Assessment & Plan:   Assessment & Plan Right lower lobe lung mass Right lung mass suspicious for malignancy Large right lung mass, 7x5 cm, likely malignant, causing airway obstruction and symptoms. Differential includes lung cancer. Further evaluation required. - Scheduled lung mass biopsy on Thursday at [facility]. - Instructed to stop aspirin this week before procedure. - Instructed to fast from midnight before procedure. - Instructed to arrange transportation post-procedure due to sedation. - Scheduled PET scan and brain MRI on November 10th at [facility]. - Ordered baseline pulmonary function test. - Follow-up in two weeks post-biopsy to discuss results.  Type 2 diabetes mellitus Managed with metformin . No medication changes needed before procedure. - Continue metformin  as prescribed.  Orders Placed This Encounter  Procedures   Procedural/ Surgical Case Request: ENDOBRONCHIAL ULTRASOUND (EBUS)   Pulmonary function test   I spent 45 minutes reviewing patient's chart including prior consultant notes, imaging, and PFTs as well as face-to-face with the patient, over half in discussion of the diagnosis and the  importance of compliance with the treatment plan.  Return in about 4 weeks (around 08/12/2024).   Genella Bas, MD

## 2024-07-15 NOTE — Assessment & Plan Note (Signed)
 Right lung mass suspicious for malignancy Large right lung mass, 7x5 cm, likely malignant, causing airway obstruction and symptoms. Differential includes lung cancer. Further evaluation required. - Scheduled lung mass biopsy on Thursday at [facility] - Instructed to stop aspirin this week before procedure. - Instructed to fast from midnight before procedure. - Instructed to arrange transportation post-procedure due to sedation. - Scheduled PET scan and brain MRI on November 10th at [facility]. - Ordered baseline pulmonary function test. - Follow-up in two weeks post-biopsy to discuss results.  Type 2 diabetes mellitus Managed with metformin . No medication changes needed before procedure. - Continue metformin  as prescribed.

## 2024-07-15 NOTE — Telephone Encounter (Signed)
 Sending to Shakimah to auth. Letter given to Jamie to give to pt

## 2024-07-16 ENCOUNTER — Other Ambulatory Visit: Payer: Self-pay

## 2024-07-16 ENCOUNTER — Encounter (HOSPITAL_COMMUNITY): Payer: Self-pay | Admitting: Pulmonary Disease

## 2024-07-16 ENCOUNTER — Other Ambulatory Visit (HOSPITAL_COMMUNITY): Payer: Self-pay

## 2024-07-16 MED ORDER — PANTOPRAZOLE SODIUM 40 MG PO TBEC
40.0000 mg | DELAYED_RELEASE_TABLET | Freq: Every evening | ORAL | 0 refills | Status: DC
  Filled 2024-07-16 – 2024-08-16 (×2): qty 90, 90d supply, fill #0
  Filled 2024-09-03: qty 30, 30d supply, fill #0

## 2024-07-16 NOTE — Progress Notes (Addendum)
 PCP - Corean Cough, FNP  Cardiologist -  Oncology Sherrod Sherrod, MD   PPM/ICD - denies Device Orders - n/a Rep Notified - n/a  Chest x-ray - denies EKG - 03-21-24 Stress Test - denies ECHO - denies Cardiac Cath - denies  CPAP - denies  GLP-1 -denies  Fasting Blood Sugar - Per patient's wife blood sugar ranges 90-112 Checks Blood Sugar daily empagliflozin  (JARDIANCE ) last dose 07-15-24 Last A1c on 06-21-24 7.0 Blood Thinner Instructions: denies Aspirin Instructions: last dose 07-13-24  ERAS Protcol - NPO  COVID TEST- n/a  Anesthesia review: no  Patient verbally denies any shortness of breath, fever, cough and chest pain during phone call   -------------  SDW INSTRUCTIONS given:  Your procedure is scheduled on July 18, 2024.  Report to [facility] Main Entrance A at 8:45 A.M., and check in at the Admitting office.  Call this number if you have problems the morning of surgery:  [phone]   Remember:  Do not eat or drink after midnight the night before your surgery      Take these medicines the morning of surgery with A SIP OF WATER  famotidine  (PEPCID )  fluticasone  (FLONASE )  pantoprazole  (PROTONIX )  tamsulosin  (FLOMAX )  tizanidine (ZANAFLEX)   Aspirin FOLLOW INSTRUCTIONS GIVEN TO YOU BY SURGEON  As of today, STOP taking any  (unless otherwise instructed by your surgeon) Aleve, Naproxen, Ibuprofen, Motrin, Advil, Goody's, BC's, all herbal medications, fish oil, and all vitamins.OMEGA-3 FATTY ACIDS        WHAT DO I DO ABOUT MY DIABETES MEDICATION?   Do not take oral diabetes medicines empagliflozin  (JARDIANCE ) metFORMIN  (GLUCOPHAGE ) the morning of surgery.   The day of surgery, do not take other diabetes injectables, including Byetta (exenatide), Bydureon (exenatide ER), Victoza (liraglutide), or Trulicity (dulaglutide).  If your CBG is greater than 220 mg/dL, you may take  of your sliding scale (correction) dose of insulin .   HOW TO MANAGE  YOUR DIABETES BEFORE AND AFTER SURGERY  Why is it important to control my blood sugar before and after surgery? Improving blood sugar levels before and after surgery helps healing and can limit problems. A way of improving blood sugar control is eating a healthy diet by:  Eating less sugar and carbohydrates  Increasing activity/exercise  Talking with your doctor about reaching your blood sugar goals High blood sugars (greater than 180 mg/dL) can raise your risk of infections and slow your recovery, so you will need to focus on controlling your diabetes during the weeks before surgery. Make sure that the doctor who takes care of your diabetes knows about your planned surgery including the date and location.  How do I manage my blood sugar before surgery? Check your blood sugar at least 4 times a day, starting 2 days before surgery, to make sure that the level is not too high or low.  Check your blood sugar the morning of your surgery when you wake up and every 2 hours until you get to the Short Stay unit.  If your blood sugar is less than 70 mg/dL, you will need to treat for low blood sugar: Do not take insulin . Treat a low blood sugar (less than 70 mg/dL) with  cup of clear juice (cranberry or apple), 4 glucose tablets, OR glucose gel. Recheck blood sugar in 15 minutes after treatment (to make sure it is greater than 70 mg/dL). If your blood sugar is not greater than 70 mg/dL on recheck, call [phone] for further instructions. Report your  blood sugar to the short stay nurse when you get to Short Stay.  If you are admitted to the hospital after surgery: Your blood sugar will be checked by the staff and you will probably be given insulin  after surgery (instead of oral diabetes medicines) to make sure you have good blood sugar levels. The goal for blood sugar control after surgery is 80-180 mg/dL.                Do not wear jewelry, make up, or nail polish            Do not wear lotions,  powders, perfumes/colognes, or deodorant.            Do not shave 48 hours prior to surgery.  Men may shave face and neck.            Do not bring valuables to the hospital.            [facility] is not responsible for any belongings or valuables.  Do NOT Smoke (Tobacco/Vaping) 24 hours prior to your procedure If you use a CPAP at night, you may bring all equipment for your overnight stay.   Contacts, glasses, dentures or bridgework may not be worn into surgery.      For patients admitted to the hospital, discharge time will be determined by your treatment team.   Patients discharged the day of surgery will not be allowed to drive home, and someone needs to stay with them for 24 hours.    Special instructions:   [place]- Preparing For Surgery  Before surgery, you can play an important role. Because skin is not sterile, your skin needs to be as free of germs as possible. You can reduce the number of germs on your skin by washing with CHG (chlorahexidine gluconate) Soap before surgery.  CHG is an antiseptic cleaner which kills germs and bonds with the skin to continue killing germs even after washing.    Oral Hygiene is also important to reduce your risk of infection.  Remember - BRUSH YOUR TEETH THE MORNING OF SURGERY WITH YOUR REGULAR TOOTHPASTE  Please do not use if you have an allergy to CHG or antibacterial soaps. If your skin becomes reddened/irritated stop using the CHG.  Do not shave (including legs and underarms) for at least 48 hours prior to first CHG shower. It is OK to shave your face.  Please follow these instructions carefully.   Shower the NIGHT BEFORE SURGERY and the MORNING OF SURGERY with DIAL Soap.   Pat yourself dry with a CLEAN TOWEL.  Wear CLEAN PAJAMAS to bed the night before surgery  Place CLEAN SHEETS on your bed the night of your first shower and DO NOT SLEEP WITH PETS.   Day of Surgery: Please shower morning of surgery  Wear Clean/Comfortable  clothing the morning of surgery Do not apply any deodorants/lotions.   Remember to brush your teeth WITH YOUR REGULAR TOOTHPASTE.   Questions were answered. Patient verbalized understanding of instructions.

## 2024-07-17 ENCOUNTER — Ambulatory Visit (HOSPITAL_BASED_OUTPATIENT_CLINIC_OR_DEPARTMENT_OTHER): Admitting: Pulmonary Disease

## 2024-07-17 ENCOUNTER — Other Ambulatory Visit (HOSPITAL_COMMUNITY): Payer: Self-pay

## 2024-07-17 LAB — OPHTHALMOLOGY REPORT-SCANNED

## 2024-07-17 NOTE — Anesthesia Preprocedure Evaluation (Signed)
 Anesthesia Evaluation  Patient identified by MRN, date of birth, ID band Patient awake    Reviewed: Allergy & Precautions, NPO status , Patient's Chart, lab work & pertinent test results  Airway Mallampati: II  TM Distance: >3 FB Neck ROM: Limited    Dental no notable dental hx. (+) Dental Advisory Given   Pulmonary former smoker   Pulmonary exam normal breath sounds clear to auscultation       Cardiovascular hypertension, Pt. on medications + Peripheral Vascular Disease  Normal cardiovascular exam+ dysrhythmias  Rhythm:Regular Rate:Normal     Neuro/Psych negative neurological ROS     GI/Hepatic Neg liver ROS,GERD  Medicated and Controlled,,  Endo/Other  diabetes    Renal/GU Renal disease     Musculoskeletal negative musculoskeletal ROS (+)    Abdominal   Peds  Hematology negative hematology ROS (+)   Anesthesia Other Findings   Reproductive/Obstetrics                              Anesthesia Physical Anesthesia Plan  ASA: 3  Anesthesia Plan: General   Post-op Pain Management: Minimal or no pain anticipated   Induction: Intravenous  PONV Risk Score and Plan: 2 and Ondansetron, Dexamethasone, Treatment may vary due to age or medical condition, TIVA and Propofol infusion  Airway Management Planned: Oral ETT and Video Laryngoscope Planned  Additional Equipment:   Intra-op Plan:   Post-operative Plan: Extubation in OR  Informed Consent: I have reviewed the patients History and Physical, chart, labs and discussed the procedure including the risks, benefits and alternatives for the proposed anesthesia with the patient or authorized representative who has indicated his/her understanding and acceptance.     Dental advisory given  Plan Discussed with: CRNA  Anesthesia Plan Comments:          Anesthesia Quick Evaluation

## 2024-07-18 ENCOUNTER — Ambulatory Visit (HOSPITAL_BASED_OUTPATIENT_CLINIC_OR_DEPARTMENT_OTHER): Payer: Self-pay | Admitting: Anesthesiology

## 2024-07-18 ENCOUNTER — Ambulatory Visit (HOSPITAL_COMMUNITY)

## 2024-07-18 ENCOUNTER — Other Ambulatory Visit: Payer: Self-pay

## 2024-07-18 ENCOUNTER — Telehealth: Payer: Self-pay | Admitting: Pulmonary Disease

## 2024-07-18 ENCOUNTER — Encounter (HOSPITAL_COMMUNITY): Payer: Self-pay | Admitting: Pulmonary Disease

## 2024-07-18 ENCOUNTER — Ambulatory Visit (HOSPITAL_COMMUNITY): Payer: Self-pay | Admitting: Anesthesiology

## 2024-07-18 ENCOUNTER — Ambulatory Visit (HOSPITAL_COMMUNITY)
Admission: RE | Admit: 2024-07-18 | Discharge: 2024-07-18 | Disposition: A | Discharge status: Home / Self Care | Attending: Pulmonary Disease | Admitting: Pulmonary Disease

## 2024-07-18 ENCOUNTER — Encounter (HOSPITAL_COMMUNITY)
Admission: RE | Disposition: A | Discharge status: Home / Self Care | Payer: Self-pay | Source: Home / Self Care | Attending: Pulmonary Disease

## 2024-07-18 DIAGNOSIS — K219 Gastro-esophageal reflux disease without esophagitis: Secondary | ICD-10-CM | POA: Diagnosis not present

## 2024-07-18 DIAGNOSIS — J383 Other diseases of vocal cords: Secondary | ICD-10-CM

## 2024-07-18 DIAGNOSIS — Z87891 Personal history of nicotine dependence: Secondary | ICD-10-CM | POA: Diagnosis not present

## 2024-07-18 DIAGNOSIS — I129 Hypertensive chronic kidney disease with stage 1 through stage 4 chronic kidney disease, or unspecified chronic kidney disease: Secondary | ICD-10-CM

## 2024-07-18 DIAGNOSIS — E1122 Type 2 diabetes mellitus with diabetic chronic kidney disease: Secondary | ICD-10-CM

## 2024-07-18 DIAGNOSIS — Z7984 Long term (current) use of oral hypoglycemic drugs: Secondary | ICD-10-CM | POA: Insufficient documentation

## 2024-07-18 DIAGNOSIS — I10 Essential (primary) hypertension: Secondary | ICD-10-CM | POA: Insufficient documentation

## 2024-07-18 DIAGNOSIS — E1151 Type 2 diabetes mellitus with diabetic peripheral angiopathy without gangrene: Secondary | ICD-10-CM | POA: Diagnosis not present

## 2024-07-18 DIAGNOSIS — R918 Other nonspecific abnormal finding of lung field: Secondary | ICD-10-CM

## 2024-07-18 DIAGNOSIS — N183 Chronic kidney disease, stage 3 unspecified: Secondary | ICD-10-CM | POA: Diagnosis not present

## 2024-07-18 HISTORY — PX: VIDEO BRONCHOSCOPY WITH ENDOBRONCHIAL ULTRASOUND: SHX6177

## 2024-07-18 LAB — GLUCOSE, CAPILLARY
Glucose-Capillary: 131 mg/dL — ABNORMAL HIGH (ref 70–99)
Glucose-Capillary: 126 mg/dL — ABNORMAL HIGH (ref 70–99)
Glucose-Capillary: 137 mg/dL — ABNORMAL HIGH (ref 70–99)
Glucose-Capillary: 78 mg/dL (ref 70–99)

## 2024-07-18 SURGERY — BRONCHOSCOPY, WITH EBUS
Anesthesia: General | Laterality: Bilateral

## 2024-07-18 MED ORDER — PROPOFOL 10 MG/ML IV BOLUS
INTRAVENOUS | Status: DC | PRN
Start: 2024-07-18 — End: 2024-07-18
  Administered 2024-07-18: 90 mg via INTRAVENOUS
  Administered 2024-07-18: 80 ug/kg/min via INTRAVENOUS

## 2024-07-18 MED ORDER — FENTANYL CITRATE (PF) 250 MCG/5ML IJ SOLN
INTRAMUSCULAR | Status: DC | PRN
  Administered 2024-07-18 (×2): 25 ug via INTRAVENOUS

## 2024-07-18 MED ORDER — LIDOCAINE 2% (20 MG/ML) 5 ML SYRINGE
INTRAMUSCULAR | Status: DC | PRN
Start: 2024-07-18 — End: 2024-07-18
  Administered 2024-07-18: 80 mg via INTRAVENOUS

## 2024-07-18 MED ORDER — PHENYLEPHRINE HCL (PRESSORS) 10 MG/ML IV SOLN
INTRAVENOUS | Status: DC | PRN
  Administered 2024-07-18: 240 ug via INTRAVENOUS
  Administered 2024-07-18: 80 ug via INTRAVENOUS

## 2024-07-18 MED ORDER — CHLORHEXIDINE GLUCONATE 0.12 % MT SOLN
OROMUCOSAL | Status: AC
  Administered 2024-07-18: 15 mL
  Filled 2024-07-18: qty 15

## 2024-07-18 MED ORDER — GLYCOPYRROLATE PF 0.2 MG/ML IJ SOSY
PREFILLED_SYRINGE | INTRAMUSCULAR | Status: DC | PRN
  Administered 2024-07-18: .2 mg via INTRAVENOUS

## 2024-07-18 MED ORDER — ROCURONIUM BROMIDE 100 MG/10ML IV SOLN
INTRAVENOUS | Status: DC | PRN
  Administered 2024-07-18: 10 mg via INTRAVENOUS
  Administered 2024-07-18: 50 mg via INTRAVENOUS

## 2024-07-18 MED ORDER — DEXAMETHASONE SOD PHOSPHATE PF 10 MG/ML IJ SOLN
INTRAMUSCULAR | Status: DC | PRN
  Administered 2024-07-18: 5 mg via INTRAVENOUS

## 2024-07-18 MED ORDER — INSULIN ASPART 100 UNIT/ML IJ SOLN: 0.0000 [IU] | INTRAMUSCULAR | Status: DC | PRN

## 2024-07-18 MED ORDER — DROPERIDOL 2.5 MG/ML IJ SOLN
0.6250 mg | Freq: Once | INTRAMUSCULAR | Status: DC | PRN
Start: 2024-07-18 — End: 2024-07-18

## 2024-07-18 MED ORDER — LACTATED RINGERS IV SOLN: INTRAVENOUS | Status: DC

## 2024-07-18 MED ORDER — ONDANSETRON HCL 4 MG/2ML IJ SOLN
INTRAMUSCULAR | Status: DC | PRN
  Administered 2024-07-18: 4 mg via INTRAVENOUS

## 2024-07-18 MED ORDER — SUGAMMADEX SODIUM 200 MG/2ML IV SOLN
INTRAVENOUS | Status: DC | PRN
  Administered 2024-07-18: 200 mg via INTRAVENOUS

## 2024-07-18 MED ORDER — FENTANYL CITRATE (PF) 100 MCG/2ML IJ SOLN
INTRAMUSCULAR | Status: AC
  Filled 2024-07-18: qty 2

## 2024-07-18 MED ORDER — FENTANYL CITRATE (PF) 100 MCG/2ML IJ SOLN: 25.0000 ug | INTRAMUSCULAR | Status: DC | PRN

## 2024-07-18 MED ORDER — EPHEDRINE SULFATE (PRESSORS) 25 MG/5ML IV SOSY
PREFILLED_SYRINGE | INTRAVENOUS | Status: DC | PRN
  Administered 2024-07-18 (×3): 5 mg via INTRAVENOUS

## 2024-07-18 MED ORDER — SODIUM CHLORIDE 0.9 % IV SOLN
Freq: Once | INTRAVENOUS | Status: DC
Start: 2024-07-18 — End: 2024-07-18

## 2024-07-18 MED ORDER — SODIUM CHLORIDE (PF) 0.9 % IJ SOLN
PREFILLED_SYRINGE | INTRAVENOUS | Status: DC | PRN
Start: 2024-07-18 — End: 2024-07-18
  Administered 2024-07-18: 1 mL

## 2024-07-18 NOTE — Telephone Encounter (Signed)
 Called patient's wife and informed her of bronchoscopy findings. I also informed her that anesthesia were concerned about a lesion in his upper airway. I will put in a referral to ENT for formal evaluation with laryngoscopy.

## 2024-07-18 NOTE — Anesthesia Procedure Notes (Signed)
 Procedure Name: Intubation Date/Time: 07/18/2024 11:41 AM  Performed by: Bess Josette ORN, CRNAPre-anesthesia Checklist: Patient identified, Emergency Drugs available, Suction available, Patient being monitored and Timeout performed Patient Re-evaluated:Patient Re-evaluated prior to induction Oxygen Delivery Method: Circle system utilized Preoxygenation: Pre-oxygenation with 100% oxygen Induction Type: IV induction Ventilation: Mask ventilation without difficulty Laryngoscope Size: McGrath Tube type: Oral Tube size: 8.5 mm Number of attempts: 1 Airway Equipment and Method: Stylet

## 2024-07-18 NOTE — Discharge Instructions (Signed)

## 2024-07-18 NOTE — Interval H&P Note (Signed)
 History and Physical Interval Note:  07/18/2024 9:17 AM  Edward [name]  has presented today for surgery, with the diagnosis of lung mass.  The various methods of treatment have been discussed with the patient and family. After consideration of risks, benefits and other options for treatment, the patient has consented to  Procedure(s): BRONCHOSCOPY, WITH EBUS (Bilateral) as a surgical intervention.  The patient's history has been reviewed, patient examined, no change in status, stable for surgery.  I have reviewed the patient's chart and labs.  Questions were answered to the patient's satisfaction.     Edward [name]

## 2024-07-18 NOTE — Transfer of Care (Signed)
 Immediate Anesthesia Transfer of Care Note  Patient: Edward [name]  Procedure(s) Performed: BRONCHOSCOPY, WITH EBUS (Bilateral)  Patient Location: PACU  Anesthesia Type:General  Level of Consciousness: drowsy  Airway & Oxygen Therapy: Patient connected to face mask oxygen  Post-op Assessment: Report given to RN  Post vital signs: stable  Last Vitals:  Vitals Value Taken Time  BP 140/64 07/18/24 12:35  Temp    Pulse 36 07/18/24 12:38  Resp 16 07/18/24 12:38  SpO2 96 % 07/18/24 12:38  Vitals shown include unfiled device data.  Last Pain:  Vitals:   07/18/24 1235  TempSrc:   PainSc: Asleep         Complications: No notable events documented.

## 2024-07-18 NOTE — Op Note (Signed)
 Video Bronchoscopy with Endobronchial Ultrasound Procedure Note  Date of Operation: 07/18/2024  Pre-op Diagnosis: RML Mass  Post-op Diagnosis: Same  Surgeon: Paula Southerly, MD  Assistants: N/A  Anesthesia: General endotracheal anesthesia  Operation: Flexible video fiberoptic bronchoscopy with endobronchial ultrasound and biopsies.  Estimated Blood Loss: less than 50   Complications: none  Indications and History: Edward [name] is a 83 y.o. male with hx of LBBB, HTN, HLD, GERD, and RML Mass .  Recommendation made to achieve a tissue diagnosis via endobronchial ultrasound with biopsies.  The risks, benefits, complications, treatment options and expected outcomes were discussed with the patient.  The possibilities of pneumothorax, pneumonia, reaction to medication, pulmonary aspiration, perforation of a viscus, bleeding, failure to diagnose a condition and creating a complication requiring transfusion or operation were discussed with the patient who freely signed the consent.    Description of Procedure: The patient was examined in the preoperative area and history and data from the preprocedure consultation were reviewed. It was deemed appropriate to proceed.  The patient was taken to [facility] Endoscopy room 3, identified as [name] and the procedure verified as Flexible Video Fiberoptic Bronchoscopy.  A Time Out was held and the above information confirmed. After being taken to the operating room general anesthesia was initiated and the patient  was orally intubated. The video fiberoptic bronchoscope was introduced via the endotracheal tube and a general inspection was performed which showed complete compression of right middle lobe by tumor as well as partial compression of RLL anterior and lateral subsegments. A brush was utilized to sample within RML mass. Slide was made and was adequate by ROSE. Minimal bleeding occurred. The standard scope was then withdrawn and the  endobronchial ultrasound was used to identify and characterize the peritracheal, hilar and bronchial lymph nodes. Inspection showed < 5 mm 11 L, 15 mm 4L, enlarged 7 in contiguity with RML mass. Using real-time ultrasound guidance Wang needle biopsies were take from Station 4L nodes as well as RML mass and were sent for cytology. The patient tolerated the procedure well without apparent complications. There was no significant blood loss. The bronchoscope was withdrawn. Anesthesia was reversed and the patient was taken to the PACU for recovery.   Samples: 1. Wang needle biopsies from 4L node, 5 passes 2. Wang needle biopsies from RML mass, 10 passes 3. Brushing of RML mass  Plans:  The patient will be discharged from the PACU to home when recovered from anesthesia. We will review the cytology, pathology and microbiology results with the patient when they become available. Outpatient followup will be with Lauraine Lites, NP on 07/29/2024.   Paula Southerly, MD [facility]

## 2024-07-19 NOTE — Anesthesia Postprocedure Evaluation (Signed)
 Anesthesia Post Note  Patient: Edward [name]  Procedure(s) Performed: BRONCHOSCOPY, WITH EBUS (Bilateral)     Patient location during evaluation: PACU Anesthesia Type: General Level of consciousness: sedated and patient cooperative Pain management: pain level controlled Vital Signs Assessment: post-procedure vital signs reviewed and stable Respiratory status: spontaneous breathing Cardiovascular status: stable Anesthetic complications: no   No notable events documented.  Last Vitals:  Vitals:   07/18/24 1400 07/18/24 1415  BP: 134/70 138/65  Pulse: (!) 51 (!) 49  Resp: 17 18  Temp:  36.9 C  SpO2: 96% 96%    Last Pain:  Vitals:   07/18/24 1235  TempSrc:   PainSc: Asleep                 Norleen Pope

## 2024-07-22 ENCOUNTER — Ambulatory Visit
Admission: RE | Admit: 2024-07-22 | Discharge: 2024-07-22 | Disposition: A | Discharge status: Home / Self Care | Source: Ambulatory Visit | Attending: Internal Medicine | Admitting: Internal Medicine

## 2024-07-22 DIAGNOSIS — C3490 Malignant neoplasm of unspecified part of unspecified bronchus or lung: Secondary | ICD-10-CM | POA: Insufficient documentation

## 2024-07-22 LAB — GLUCOSE, CAPILLARY: Glucose-Capillary: 125 mg/dL — ABNORMAL HIGH (ref 70–99)

## 2024-07-22 MED ORDER — GADOBUTROL 1 MMOL/ML IV SOLN
7.0000 mL | Freq: Once | INTRAVENOUS | Status: AC | PRN
  Administered 2024-07-22: 7 mL via INTRAVENOUS

## 2024-07-22 MED ORDER — FLUDEOXYGLUCOSE F - 18 (FDG) INJECTION
8.4000 | Freq: Once | INTRAVENOUS | Status: AC | PRN
  Administered 2024-07-22: 8.71 via INTRAVENOUS

## 2024-07-23 ENCOUNTER — Other Ambulatory Visit (HOSPITAL_COMMUNITY): Payer: Self-pay

## 2024-07-24 ENCOUNTER — Other Ambulatory Visit: Payer: Self-pay

## 2024-07-24 ENCOUNTER — Other Ambulatory Visit (HOSPITAL_COMMUNITY): Payer: Self-pay

## 2024-07-24 ENCOUNTER — Telehealth (INDEPENDENT_AMBULATORY_CARE_PROVIDER_SITE_OTHER): Payer: Self-pay

## 2024-07-24 LAB — CYTOLOGY - NON PAP

## 2024-07-24 NOTE — Telephone Encounter (Signed)
 Patient's wife LVM wanting to get the patient scheduled for an appt. The referral for us  is in. Requesting call back for scheduling.

## 2024-07-25 ENCOUNTER — Other Ambulatory Visit: Payer: Self-pay | Admitting: Student in an Organized Health Care Education/Training Program

## 2024-07-25 ENCOUNTER — Ambulatory Visit: Payer: Self-pay | Admitting: Pulmonary Disease

## 2024-07-25 ENCOUNTER — Other Ambulatory Visit: Payer: Self-pay

## 2024-07-25 ENCOUNTER — Other Ambulatory Visit (HOSPITAL_COMMUNITY): Payer: Self-pay

## 2024-07-25 ENCOUNTER — Telehealth: Payer: Self-pay

## 2024-07-25 DIAGNOSIS — R918 Other nonspecific abnormal finding of lung field: Secondary | ICD-10-CM

## 2024-07-25 DIAGNOSIS — R911 Solitary pulmonary nodule: Secondary | ICD-10-CM | POA: Insufficient documentation

## 2024-07-25 NOTE — Progress Notes (Signed)
 Bronch has been scheduled. See telephone encounter from today (11/13).  Nothing further needed.

## 2024-07-25 NOTE — Telephone Encounter (Signed)
 Bronchoscopy with EBUS 07/30/2024 9:00am Lung nodule [id number]  Edward [name] please see bronch info.  The patient is aware and bronch email has been sent.

## 2024-07-25 NOTE — Progress Notes (Deleted)
 Bronchoscopy with EBUS 07/30/2024 9:00am Lung nodule [id number]  Donzell please see bronch info.  The patient is aware and bronch email has been sent.

## 2024-07-28 NOTE — Progress Notes (Deleted)
 [facility], [facility] OFFICE PROGRESS NOTE  [name], NP-C [address] [place] [address]  DIAGNOSIS: Stage IIIC (T3, N3, M0)   PRIOR THERAPY:  CURRENT THERAPY:  INTERVAL HISTORY: Edward [name] 83 y.o. male returns for *** regular *** visit for followup of ***   MEDICAL HISTORY: Past Medical History:  Diagnosis Date   BPH (benign prostatic hyperplasia)    Cataract    surgical removal bilateral   Diabetes mellitus without complication (HCC)    ED (erectile dysfunction)    GERD (gastroesophageal reflux disease)    History of adenomatous polyp of colon 11/02/2017   Tubular adenoma 2013; recommended 5 year follow up   History of kidney stones    Hyperlipidemia    Hypertension    IBS (irritable bowel syndrome)    Kidney stones 02/11/2020   LBBB (left bundle branch block)    Personal history of kidney stones    Pre-diabetes    Prediabetes    Vitamin D  deficiency     ALLERGIES:  is allergic to bystolic [nebivolol hcl].  MEDICATIONS:  Current Outpatient Medications  Medication Sig Dispense Refill   APPLE CIDER VINEGAR PO Take 1-2 tablets by mouth daily.     aspirin 81 MG tablet Take 81 mg by mouth 3 (three) times a week.     atorvastatin  (LIPITOR) 40 MG tablet Take 1 tablet (40 mg total) by mouth every evening for cholesterol. 270 tablet 0   Cholecalciferol (VITAMIN D  PO) Take 5,000 Units by mouth daily.     CINNAMON PO Take 1-2 tablets by mouth daily.     Cyanocobalamin (VITAMIN B 12 PO) Take 1 tablet by mouth daily.     empagliflozin  (JARDIANCE ) 10 MG TABS tablet Take 1 tablet (10 mg total) by mouth daily. 90 tablet 0   famotidine  (PEPCID ) 40 MG tablet Take 1 tablet (40 mg total) by mouth every evening to prevent heartburn and indigestion. 90 tablet 0   fluticasone  (FLONASE ) 50 MCG/ACT nasal spray Place 1-2 sprays into both nostrils daily. 16 g 1   glucose blood (ONETOUCH VERIO) test strip USE TO TEST BLOOD SUGAR ONCE DAILY 100 strip 12   losartan   (COZAAR ) 50 MG tablet Take 1 tablet (50 mg total) by mouth daily for blood pressure. 90 tablet 0   metFORMIN  (GLUCOPHAGE ) 500 MG tablet Take 1 tablet (500 mg total) by mouth 3 (three) times daily with meals for diabetes. 270 tablet 0   OMEGA-3 FATTY ACIDS PO Take 700 mg by mouth daily.     pantoprazole  (PROTONIX ) 40 MG tablet Take 1 tablet (40 mg total) by mouth every evening to prevent heartburn and indigestion. 90 tablet 0   tamsulosin  (FLOMAX ) 0.4 MG CAPS capsule Take 1 capsule (0.4 mg total) by mouth every evening for prostate. 90 capsule 0   tizanidine (ZANAFLEX) 2 MG capsule Take 1 capsule (2 mg total) by mouth 2 (two) times daily as needed for muscle spasms. 30 capsule 0   zinc gluconate 50 MG tablet Take 50 mg by mouth daily.     No current facility-administered medications for this visit.    SURGICAL HISTORY:  Past Surgical History:  Procedure Laterality Date   CATARACT EXTRACTION Right 2015   CATARACT EXTRACTION W/ INTRAOCULAR LENS IMPLANT  2013   left   COLONOSCOPY  2013   TA   EXTRACORPOREAL SHOCK WAVE LITHOTRIPSY Left 12/11/2017   Procedure: LEFT EXTRACORPOREAL SHOCK WAVE LITHOTRIPSY (ESWL);  Surgeon: Carolee Sherwood JONETTA DOUGLAS, MD;  Location: [facility];  Service: Urology;  Laterality: Left;   KIDNEY STONE SURGERY     POLYPECTOMY     VIDEO BRONCHOSCOPY WITH ENDOBRONCHIAL ULTRASOUND Bilateral 07/18/2024   Procedure: BRONCHOSCOPY, WITH EBUS;  Surgeon: Catherine Cools, MD;  Location: [facility];  Service: Pulmonary;  Laterality: Bilateral;    REVIEW OF SYSTEMS:   Review of Systems  Constitutional: Negative for appetite change, chills, fatigue, fever and unexpected weight change.  HENT:   Negative for mouth sores, nosebleeds, sore throat and trouble swallowing.   Eyes: Negative for eye problems and icterus.  Respiratory: Negative for cough, hemoptysis, shortness of breath and wheezing.   Cardiovascular: Negative for chest pain and leg swelling.  Gastrointestinal: Negative for abdominal  pain, constipation, diarrhea, nausea and vomiting.  Genitourinary: Negative for bladder incontinence, difficulty urinating, dysuria, frequency and hematuria.   Musculoskeletal: Negative for back pain, gait problem, neck pain and neck stiffness.  Skin: Negative for itching and rash.  Neurological: Negative for dizziness, extremity weakness, gait problem, headaches, light-headedness and seizures.  Hematological: Negative for adenopathy. Does not bruise/bleed easily.  Psychiatric/Behavioral: Negative for confusion, depression and sleep disturbance. The patient is not nervous/anxious.     PHYSICAL EXAMINATION:  There were no vitals taken for this visit.  ECOG PERFORMANCE STATUS: {CHL ONC ECOG [id number]  Physical Exam  Constitutional: Oriented to person, place, and time and well-developed, well-nourished, and in no distress. No distress.  HENT:  Head: Normocephalic and atraumatic.  Mouth/Throat: Oropharynx is clear and moist. No oropharyngeal exudate.  Eyes: Conjunctivae are normal. Right eye exhibits no discharge. Left eye exhibits no discharge. No scleral icterus.  Neck: Normal range of motion. Neck supple.  Cardiovascular: Normal rate, regular rhythm, normal heart sounds and intact distal pulses.   Pulmonary/Chest: Effort normal and breath sounds normal. No respiratory distress. No wheezes. No rales.  Abdominal: Soft. Bowel sounds are normal. Exhibits no distension and no mass. There is no tenderness.  Musculoskeletal: Normal range of motion. Exhibits no edema.  Lymphadenopathy:    No cervical adenopathy.  Neurological: Alert and oriented to person, place, and time. Exhibits normal muscle tone. Gait normal. Coordination normal.  Skin: Skin is warm and dry. No rash noted. Not diaphoretic. No erythema. No pallor.  Psychiatric: Mood, memory and judgment normal.  Vitals reviewed.  LABORATORY DATA: Lab Results  Component Value Date   WBC 4.8 07/11/2024   HGB 13.6 07/11/2024   HCT  41.5 07/11/2024   MCV 88.3 07/11/2024   PLT 143 (L) 07/11/2024      Chemistry      Component Value Date/Time   NA 142 07/11/2024 1303   K 4.3 07/11/2024 1303   CL 109 07/11/2024 1303   CO2 29 07/11/2024 1303   BUN 18 07/11/2024 1303   CREATININE 1.33 (H) 07/11/2024 1303   CREATININE 1.34 (H) 08/22/2023 1156      Component Value Date/Time   CALCIUM  8.9 07/11/2024 1303   ALKPHOS 65 07/11/2024 1303   AST 14 (L) 07/11/2024 1303   ALT 8 07/11/2024 1303   BILITOT 0.6 07/11/2024 1303       RADIOGRAPHIC STUDIES:  MR BRAIN W WO CONTRAST Result Date: 07/26/2024 EXAM: MRI BRAIN WITH AND WITHOUT CONTRAST 07/22/2024 01:21:28 PM TECHNIQUE: Multiplanar multisequence MRI of the head/brain was performed with and without the administration of 7 mL gadobutrol (GADAVIST) 1 MMOL/ML injection. COMPARISON: None available. CLINICAL HISTORY: Non-small cell lung cancer (NSCLC), staging. FINDINGS: BRAIN AND VENTRICLES: There is age-related cerebral volume loss present and there is  mild-to-moderate cerebral white matter disease. There are no findings concerning for metastatic disease. No acute infarct. No acute intracranial hemorrhage. No mass effect or midline shift. No hydrocephalus. The sella is unremarkable. Normal flow voids. No mass or abnormal enhancement. ORBITS: No acute abnormality. SINUSES: No acute abnormality. BONES AND SOFT TISSUES: Normal bone marrow signal and enhancement. No acute soft tissue abnormality. IMPRESSION: 1. No acute intracranial abnormality. 2. No findings concerning for metastatic disease. 3. Age-related cerebral volume loss and mild-to-moderate cerebral white matter disease. Electronically signed by: Evalene Coho MD 07/26/2024 08:58 AM EST RP Workstation: [id number]   NM PET Image Initial (PI) Skull Base To Thigh (F-18 FDG) Result Date: 07/24/2024 CLINICAL DATA:  Initial treatment strategy for lung nodule. EXAM: NUCLEAR MEDICINE PET SKULL BASE TO THIGH TECHNIQUE: 8.7 mCi  F-18 FDG was injected intravenously. Full-ring PET imaging was performed from the skull base to thigh after the radiotracer. CT data was obtained and used for attenuation correction and anatomic localization. Fasting blood glucose: 125 mg/dl COMPARISON:  CT chest abdomen pelvis 07/02/2024. FINDINGS: Mediastinal blood pool activity: SUV max 2.1 Liver activity: SUV max NA NECK: No abnormal hypermetabolism. Incidental CT findings: None. CHEST: A mildly hypermetabolic mass is seen predominantly in the perihilar right middle lobe with some involvement of the superior segment right lower lobe, 5.6 x 6.2 cm (6/67), SUV max 3.2. Right hilar lymph node measures 8 mm, SUV max 2.6. Low left paratracheal lymph nodes measure up to 12 mm (6/55), SUV max 2.9. Low left internal jugular lymph node measures 7 mm, SUV max 2.2. Incidental CT findings: Atherosclerotic calcification of the aorta and coronary arteries. Heart is enlarged. Small pericardial effusion. Aforementioned mass obstructs the right middle lobe bronchus with associated collapse of the right middle lobe. Associated narrowing of the left lower lobe bronchus. Small right pleural effusion. ABDOMEN/PELVIS: Focal anorectal hypermetabolism, SUV max 9.4. No additional abnormal hypermetabolism. Incidental CT findings: There may be sludge in the gallbladder. Low-attenuation lesions in the kidneys. No specific follow-up necessary. Small right renal stone. Tiny hiatal hernia. Left inguinal hernia contains a knuckle of sigmoid colon. SKELETON: No abnormal hypermetabolism. Incidental CT findings: Bilateral L5 pars defects with severe grade 1 or mild grade 2 anterolisthesis of L5 on S1. Degenerative changes in the spine. IMPRESSION: 1. 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 2. Focal  anorectal hypermetabolism, indeterminate. Malignancy cannot be excluded. 3. Small pericardial effusion. 4. Small right pleural effusion. 5. Postobstructive right middle lobe collapse. 6. Small right renal stone. 7. Bilateral L5 pars defects with severe grade 1 or mild grade 2 anterolisthesis. 8. Aortic atherosclerosis (ICD10-I70.0). Coronary artery calcification. Electronically Signed   By: Newell Eke M.D.   On: 07/24/2024 09:35   CT CHEST ABDOMEN PELVIS W CONTRAST Result Date: 07/02/2024 CLINICAL DATA:  Abdominal pain, right hilar mass suspected by prior chest radiographs * Tracking Code: BO * EXAM: CT CHEST, ABDOMEN, AND PELVIS WITH CONTRAST TECHNIQUE: Multidetector CT imaging of the chest, abdomen and pelvis was performed following the standard protocol during bolus administration of intravenous contrast. RADIATION DOSE REDUCTION: This exam was performed according to the departmental dose-optimization program which includes automated exposure control, adjustment of the mA and/or kV according to patient size and/or use of iterative reconstruction technique. CONTRAST:  ISOVUE-300 IOPAMIDOL (ISOVUE-300) INJECTION 61% COMPARISON:  Chest and rib radiographs, 06/27/2024 FINDINGS: CT CHEST FINDINGS Cardiovascular: No  significant vascular findings. Cardiomegaly. Left coronary artery calcifications. Small pericardial effusion. Marked narrowing of the right lower lobe and right middle lobe pulmonary arteries due to right hilar mass effect. Mediastinum/Nodes: Right hilar mass as detailed below; no other enlarged mediastinal lymph nodes. No enlarged mediastinal, hilar, or axillary lymph nodes. Thyroid  gland, trachea, and esophagus demonstrate no significant findings. Lungs/Pleura: Lobulated mass of the inferior right hilum, with frank occlusion of the right middle lobe bronchi and marked narrowing of the right lower lobar and proximal segmental airways mass measuring approximately 7.3 x 5.3 cm (series 4, image  43). Resultant, near complete postobstructive atelectasis of the right middle lobe. Minimal predominantly paraseptal emphysema. Diffuse bilateral bronchial wall thickening. Small right pleural effusion. Musculoskeletal: No chest wall abnormality. No acute osseous findings. CT ABDOMEN PELVIS FINDINGS Hepatobiliary: No solid liver abnormality is seen. No gallstones, gallbladder wall thickening, or biliary dilatation. Pancreas: Unremarkable. No pancreatic ductal dilatation or surrounding inflammatory changes. Spleen: Normal in size without significant abnormality. Adrenals/Urinary Tract: Adrenal glands are unremarkable. Small nonobstructive calculus of the inferior pole of the right kidney no left-sided calculi or hydronephrosis simple, benign bilateral renal cortical and parapelvic cysts for which no specific further follow-up or characterization is required. Calculus in the dependent midline bladder measuring 0.7 cm, as well as multiple additional tiny calculi and gravel (series 4, image 113) Stomach/Bowel: Stomach is within normal limits. Appendix appears normal. No evidence of bowel wall thickening, distention, or inflammatory changes. Sigmoid diverticulosis. Vascular/Lymphatic: No significant vascular findings are present. No enlarged abdominal or pelvic lymph nodes. Reproductive: Prostatomegaly. Other: No small fat containing left inguinal hernia.  No ascites. Musculoskeletal: No acute osseous findings. Chronic bilateral pars defects of L5 with degenerative anterolisthesis of L5 on S1. IMPRESSION: 1. Large, lobular mass of the inferior right hilum, with frank occlusion of the right middle lobe bronchi and marked narrowing of the right lower lobar and proximal segmental airways. Findings are consistent with primary lung malignancy. 2. Mass is inseparable from the right hilum; no other evidence of mediastinal lymphadenopathy nor distant metastatic disease in the chest, abdomen, or pelvis. 3. Small right pleural  effusion. 4. Cardiomegaly and coronary artery disease. 5. Nonobstructive right nephrolithiasis.  Multiple bladder calculi. These results will be called to the ordering clinician or representative by the Radiologist Assistant, and communication documented in the PACS or [employer]. Emphysema (ICD10-J43.9). Electronically Signed   By: Marolyn JONETTA Jaksch M.D.   On: 07/02/2024 15:09     ASSESSMENT/PLAN:  No problem-specific Assessment & Plan notes found for this encounter.   No orders of the defined types were placed in this encounter.    I spent {CHL ONC TIME VISIT - DTPQU:[id number]} counseling the patient face to face. The total time spent in the appointment was {CHL ONC TIME VISIT - DTPQU:[id number]}.  Marquarius Lofton L Koltyn Kelsay, PA-C 07/28/24

## 2024-07-29 ENCOUNTER — Encounter: Payer: Self-pay | Admitting: Acute Care

## 2024-07-29 ENCOUNTER — Encounter
Admission: RE | Admit: 2024-07-29 | Discharge: 2024-07-29 | Disposition: A | Discharge status: Home / Self Care | Source: Ambulatory Visit | Attending: Student in an Organized Health Care Education/Training Program | Admitting: Student in an Organized Health Care Education/Training Program

## 2024-07-29 ENCOUNTER — Telehealth: Payer: Self-pay | Admitting: Family Medicine

## 2024-07-29 ENCOUNTER — Ambulatory Visit: Admitting: Acute Care

## 2024-07-29 DIAGNOSIS — E1122 Type 2 diabetes mellitus with diabetic chronic kidney disease: Secondary | ICD-10-CM

## 2024-07-29 HISTORY — DX: Type 2 diabetes mellitus without complications: E11.9

## 2024-07-29 HISTORY — DX: Solitary pulmonary nodule: R91.1

## 2024-07-29 HISTORY — DX: Chronic kidney disease, stage 3 unspecified: N18.30

## 2024-07-29 MED ORDER — CHLORHEXIDINE GLUCONATE 0.12 % MT SOLN: 15.0000 mL | Freq: Once | OROMUCOSAL | Status: DC

## 2024-07-29 MED ORDER — ORAL CARE MOUTH RINSE: 15.0000 mL | Freq: Once | OROMUCOSAL | Status: DC

## 2024-07-29 MED ORDER — SODIUM CHLORIDE 0.9 % IV SOLN: INTRAVENOUS | Status: DC

## 2024-07-29 NOTE — Patient Instructions (Signed)
 Your procedure is scheduled on:07-30-24 Tuesday Report to the Registration Desk on the 1st floor of the [facility].Then proceed to the 2nd floor Surgery desk. Arrive at 8 am   REMEMBER: Instructions that are not followed completely may result in serious medical risk, up to and including death; or upon the discretion of your surgeon and anesthesiologist your surgery may need to be rescheduled.  Do not eat food OR drink liquids after midnight the night before surgery.  No gum chewing or hard candies.  One week prior to surgery:Stop NOW (07-29-24) Stop Anti-inflammatories (NSAIDS) such as Advil, Aleve, Ibuprofen, Motrin, Naproxen, Naprosyn and Aspirin based products such as Excedrin, Goody's Powder, BC Powder.  You may however, continue to take Tylenol if needed for pain up until the day of surgery.  Stop empagliflozin  (JARDIANCE ) NOW 07-29-24 (Monday)  Stop metFORMIN  (GLUCOPHAGE ) NOW 07-29-24 (Monday)  Continue taking all of your other prescription medications up until the day of surgery.  Do NOT take any medication the day of surgery  Continue your 81 mg Aspirin-Do NOT take the day of surgery  No Alcohol for 24 hours before or after surgery.  No Smoking including e-cigarettes for 24 hours before surgery.  No chewable tobacco products for at least 6 hours before surgery.  No nicotine patches on the day of surgery.  Do not use any recreational drugs for at least a week (preferably 2 weeks) before your surgery.  Please be advised that the combination of cocaine and anesthesia may have negative outcomes, up to and including death. If you test positive for cocaine, your surgery will be cancelled.  On the morning of surgery brush your teeth with toothpaste and water, you may rinse your mouth with mouthwash if you wish. Do not swallow any toothpaste or mouthwash.  Do not wear jewelry, make-up, hairpins, clips or nail polish.  For welded (permanent) jewelry: bracelets, anklets,  waist bands, etc.  Please have this removed prior to surgery.  If it is not removed, there is a chance that hospital personnel will need to cut it off on the day of surgery.  Do not wear lotions, powders, or perfumes.   Do not shave body hair from the neck down 48 hours before surgery.  Contact lenses, hearing aids and dentures may not be worn into surgery.  Do not bring valuables to the hospital. [facility] is not responsible for any missing/lost belongings or valuables.   Notify your doctor if there is any change in your medical condition (cold, fever, infection).  Wear comfortable clothing (specific to your surgery type) to the hospital.  After surgery, you can help prevent lung complications by doing breathing exercises.  Take deep breaths and cough every 1-2 hours. Your doctor may order a device called an Incentive Spirometer to help you take deep breaths. When coughing or sneezing, hold a pillow firmly against your incision with both hands. This is called "splinting." Doing this helps protect your incision. It also decreases belly discomfort.  If you are being admitted to the hospital overnight, leave your suitcase in the car. After surgery it may be brought to your room.  In case of increased patient census, it may be necessary for you, the patient, to continue your postoperative care in the Same Day Surgery department.  If you are being discharged the day of surgery, you will not be allowed to drive home. You will need a responsible individual to drive you home and stay with you for 24 hours after surgery.  If you are taking public transportation, you will need to have a responsible individual with you.  Please call the Pre-admissions Testing Dept. at [phone] if you have any questions about these instructions.  Surgery Visitation Policy:  Patients having surgery or a procedure may have two visitors.  Children under the age of 11 must have an adult with them who is not  the patient.   Merchandiser, Retail to address health-related social needs:  [email or web address]

## 2024-07-29 NOTE — Telephone Encounter (Signed)
 Pt.'s wife requests all prescriptions are switched to [employer]. Please advise when done.

## 2024-07-29 NOTE — Telephone Encounter (Signed)
 Darryle long added

## 2024-07-29 NOTE — Telephone Encounter (Signed)
 Noted. Nothing further needed.

## 2024-07-29 NOTE — Telephone Encounter (Signed)
 Per Hailey with [facility] Patient is good to go. Auth # [id number] valid 07/25/24 to 10/23/2024

## 2024-07-29 NOTE — Patient Instructions (Signed)
 It is good to see you today. We will not charge you for today's visit, as you have already been called with your results. Good luck with your repeat procedure tomorrow.  Here is a list of your follow up appointments, to help you keep track of them.  Take care, and let us  know if you need anything moving forward.  Please contact office for sooner follow up if symptoms do not improve or worsen or seek emergency care

## 2024-07-30 ENCOUNTER — Encounter
Admission: RE | Disposition: A | Discharge status: Home / Self Care | Payer: Self-pay | Source: Home / Self Care | Attending: Student in an Organized Health Care Education/Training Program

## 2024-07-30 ENCOUNTER — Ambulatory Visit
Admission: RE | Admit: 2024-07-30 | Discharge: 2024-07-30 | Disposition: A | Discharge status: Home / Self Care | Attending: Student in an Organized Health Care Education/Training Program | Admitting: Student in an Organized Health Care Education/Training Program

## 2024-07-30 ENCOUNTER — Encounter: Payer: Self-pay | Admitting: Student in an Organized Health Care Education/Training Program

## 2024-07-30 ENCOUNTER — Ambulatory Visit

## 2024-07-30 ENCOUNTER — Other Ambulatory Visit: Payer: Self-pay

## 2024-07-30 DIAGNOSIS — Z833 Family history of diabetes mellitus: Secondary | ICD-10-CM | POA: Diagnosis not present

## 2024-07-30 DIAGNOSIS — Z8249 Family history of ischemic heart disease and other diseases of the circulatory system: Secondary | ICD-10-CM | POA: Diagnosis not present

## 2024-07-30 DIAGNOSIS — E1122 Type 2 diabetes mellitus with diabetic chronic kidney disease: Secondary | ICD-10-CM

## 2024-07-30 DIAGNOSIS — I10 Essential (primary) hypertension: Secondary | ICD-10-CM | POA: Diagnosis not present

## 2024-07-30 DIAGNOSIS — Z79899 Other long term (current) drug therapy: Secondary | ICD-10-CM | POA: Diagnosis not present

## 2024-07-30 DIAGNOSIS — Z87891 Personal history of nicotine dependence: Secondary | ICD-10-CM | POA: Diagnosis not present

## 2024-07-30 DIAGNOSIS — K219 Gastro-esophageal reflux disease without esophagitis: Secondary | ICD-10-CM | POA: Insufficient documentation

## 2024-07-30 DIAGNOSIS — J90 Pleural effusion, not elsewhere classified: Secondary | ICD-10-CM | POA: Diagnosis not present

## 2024-07-30 DIAGNOSIS — Z6823 Body mass index (BMI) 23.0-23.9, adult: Secondary | ICD-10-CM | POA: Insufficient documentation

## 2024-07-30 DIAGNOSIS — R634 Abnormal weight loss: Secondary | ICD-10-CM | POA: Diagnosis not present

## 2024-07-30 DIAGNOSIS — R911 Solitary pulmonary nodule: Secondary | ICD-10-CM | POA: Diagnosis present

## 2024-07-30 DIAGNOSIS — Z7984 Long term (current) use of oral hypoglycemic drugs: Secondary | ICD-10-CM | POA: Insufficient documentation

## 2024-07-30 DIAGNOSIS — E785 Hyperlipidemia, unspecified: Secondary | ICD-10-CM | POA: Diagnosis not present

## 2024-07-30 DIAGNOSIS — D3A8 Other benign neuroendocrine tumors: Secondary | ICD-10-CM | POA: Insufficient documentation

## 2024-07-30 DIAGNOSIS — Z87442 Personal history of urinary calculi: Secondary | ICD-10-CM | POA: Diagnosis not present

## 2024-07-30 DIAGNOSIS — E1151 Type 2 diabetes mellitus with diabetic peripheral angiopathy without gangrene: Secondary | ICD-10-CM | POA: Diagnosis not present

## 2024-07-30 HISTORY — PX: ENDOBRONCHIAL ULTRASOUND: SHX5096

## 2024-07-30 LAB — GLUCOSE, CAPILLARY
Glucose-Capillary: 111 mg/dL — ABNORMAL HIGH (ref 70–99)
Glucose-Capillary: 125 mg/dL — ABNORMAL HIGH (ref 70–99)

## 2024-07-30 SURGERY — ENDOBRONCHIAL ULTRASOUND (EBUS)
Anesthesia: General | Laterality: Bilateral

## 2024-07-30 MED ORDER — SUGAMMADEX SODIUM 200 MG/2ML IV SOLN
INTRAVENOUS | Status: DC | PRN
Start: 2024-07-30 — End: 2024-07-30
  Administered 2024-07-30: 200 mg via INTRAVENOUS

## 2024-07-30 MED ORDER — ONDANSETRON HCL 4 MG/2ML IJ SOLN
INTRAMUSCULAR | Status: DC | PRN
Start: 2024-07-30 — End: 2024-07-30
  Administered 2024-07-30: 4 mg via INTRAVENOUS

## 2024-07-30 MED ORDER — FENTANYL CITRATE (PF) 100 MCG/2ML IJ SOLN
INTRAMUSCULAR | Status: DC | PRN
  Administered 2024-07-30 (×2): 50 ug via INTRAVENOUS

## 2024-07-30 MED ORDER — ROCURONIUM BROMIDE 100 MG/10ML IV SOLN
INTRAVENOUS | Status: DC | PRN
  Administered 2024-07-30: 50 mg via INTRAVENOUS

## 2024-07-30 MED ORDER — DEXAMETHASONE SOD PHOSPHATE PF 10 MG/ML IJ SOLN
INTRAMUSCULAR | Status: DC | PRN
Start: 2024-07-30 — End: 2024-07-30
  Administered 2024-07-30: 10 mg via INTRAVENOUS

## 2024-07-30 MED ORDER — OXYCODONE HCL 5 MG PO TABS: 5.0000 mg | ORAL_TABLET | Freq: Once | ORAL | Status: DC | PRN

## 2024-07-30 MED ORDER — PROPOFOL 10 MG/ML IV BOLUS
INTRAVENOUS | Status: DC | PRN
Start: 2024-07-30 — End: 2024-07-30
  Administered 2024-07-30: 100 mg via INTRAVENOUS
  Administered 2024-07-30: 150 ug/kg/min via INTRAVENOUS

## 2024-07-30 MED ORDER — GLYCOPYRROLATE 0.2 MG/ML IJ SOLN
INTRAMUSCULAR | Status: DC | PRN
  Administered 2024-07-30: .2 mg via INTRAVENOUS

## 2024-07-30 MED ORDER — LIDOCAINE HCL (PF) 2 % IJ SOLN
INTRAMUSCULAR | Status: AC
  Filled 2024-07-30: qty 5

## 2024-07-30 MED ORDER — EPHEDRINE 5 MG/ML INJ
INTRAVENOUS | Status: AC
  Filled 2024-07-30: qty 5

## 2024-07-30 MED ORDER — FENTANYL CITRATE (PF) 100 MCG/2ML IJ SOLN: 25.0000 ug | INTRAMUSCULAR | Status: DC | PRN

## 2024-07-30 MED ORDER — PROPOFOL 1000 MG/100ML IV EMUL
INTRAVENOUS | Status: AC
  Filled 2024-07-30: qty 100

## 2024-07-30 MED ORDER — EPHEDRINE SULFATE-NACL 50-0.9 MG/10ML-% IV SOSY
PREFILLED_SYRINGE | INTRAVENOUS | Status: DC | PRN
  Administered 2024-07-30: 10 mg via INTRAVENOUS
  Administered 2024-07-30: 5 mg via INTRAVENOUS

## 2024-07-30 MED ORDER — LIDOCAINE HCL (CARDIAC) PF 100 MG/5ML IV SOSY
PREFILLED_SYRINGE | INTRAVENOUS | Status: DC | PRN
  Administered 2024-07-30: 100 mg via INTRAVENOUS

## 2024-07-30 MED ORDER — PHENYLEPHRINE 80 MCG/ML (10ML) SYRINGE FOR IV PUSH (FOR BLOOD PRESSURE SUPPORT)
PREFILLED_SYRINGE | INTRAVENOUS | Status: AC
  Filled 2024-07-30: qty 10

## 2024-07-30 MED ORDER — OXYCODONE HCL 5 MG/5ML PO SOLN: 5.0000 mg | Freq: Once | ORAL | Status: DC | PRN

## 2024-07-30 MED ORDER — PHENYLEPHRINE 80 MCG/ML (10ML) SYRINGE FOR IV PUSH (FOR BLOOD PRESSURE SUPPORT)
PREFILLED_SYRINGE | INTRAVENOUS | Status: DC | PRN
  Administered 2024-07-30: 160 ug via INTRAVENOUS

## 2024-07-30 MED ORDER — ROCURONIUM BROMIDE 10 MG/ML (PF) SYRINGE
PREFILLED_SYRINGE | INTRAVENOUS | Status: AC
Start: 2024-07-30 — End: 2024-07-30
  Filled 2024-07-30: qty 10

## 2024-07-30 MED ORDER — FENTANYL CITRATE (PF) 100 MCG/2ML IJ SOLN
INTRAMUSCULAR | Status: AC
  Filled 2024-07-30: qty 2

## 2024-07-30 MED ORDER — ONDANSETRON HCL 4 MG/2ML IJ SOLN
INTRAMUSCULAR | Status: AC
Start: 2024-07-30 — End: 2024-07-30
  Filled 2024-07-30: qty 2

## 2024-07-30 MED ORDER — GLYCOPYRROLATE 0.2 MG/ML IJ SOLN
INTRAMUSCULAR | Status: AC
  Filled 2024-07-30: qty 1

## 2024-07-30 NOTE — Anesthesia Postprocedure Evaluation (Signed)
 Anesthesia Post Note  Patient: [name]  Procedure(s) Performed: ENDOBRONCHIAL ULTRASOUND (EBUS) (Bilateral)  Patient location during evaluation: PACU Anesthesia Type: General Level of consciousness: awake and alert Pain management: pain level controlled Vital Signs Assessment: post-procedure vital signs reviewed and stable Respiratory status: spontaneous breathing, nonlabored ventilation, respiratory function stable and patient connected to nasal cannula oxygen Cardiovascular status: blood pressure returned to baseline and stable Postop Assessment: no apparent nausea or vomiting Anesthetic complications: no   There were no known notable events for this encounter.   Last Vitals:  Vitals:   07/30/24 1115 07/30/24 1130  BP: (!) 121/59 127/62  Pulse: 66 69  Resp: 11 14  Temp:  36.9 C  SpO2: 95% 93%    Last Pain:  Vitals:   07/30/24 1130  TempSrc:   PainSc: 0-No pain                 Lendia LITTIE Mae

## 2024-07-30 NOTE — Discharge Instructions (Signed)
 Can resume aspirin tomorrow

## 2024-07-30 NOTE — Transfer of Care (Signed)
 Immediate Anesthesia Transfer of Care Note  Patient: Edward [name]  Procedure(s) Performed: ENDOBRONCHIAL ULTRASOUND (EBUS) (Bilateral)  Patient Location: PACU  Anesthesia Type:General  Level of Consciousness: drowsy  Airway & Oxygen Therapy: Patient Spontanous Breathing  Post-op Assessment: Report given to RN and Post -op Vital signs reviewed and stable  Post vital signs: Reviewed and stable  Last Vitals:  Vitals Value Taken Time  BP 122/49 07/30/24 10:47  Temp    Pulse 71 07/30/24 10:50  Resp 19 07/30/24 10:50  SpO2 94 % 07/30/24 10:50  Vitals shown include unfiled device data.  Last Pain:  Vitals:   07/30/24 0804  TempSrc: Temporal  PainSc: 0-No pain         Complications: There were no known notable events for this encounter.

## 2024-07-30 NOTE — Anesthesia Procedure Notes (Signed)
 Procedure Name: Intubation Date/Time: 07/30/2024 9:36 AM  Performed by: Bonnetta Jimmey SAUNDERS, CRNAPre-anesthesia Checklist: Patient identified, Emergency Drugs available, Suction available and Patient being monitored Patient Re-evaluated:Patient Re-evaluated prior to induction Oxygen Delivery Method: Circle system utilized Preoxygenation: Pre-oxygenation with 100% oxygen Induction Type: IV induction Ventilation: Mask ventilation without difficulty Laryngoscope Size: McGrath and 4 Grade View: Grade I Tube type: Oral Tube size: 8.5 mm Number of attempts: 1 Airway Equipment and Method: Stylet and Oral airway Placement Confirmation: ETT inserted through vocal cords under direct vision, positive ETCO2 and breath sounds checked- equal and bilateral Secured at: 23 cm Tube secured with: Tape Dental Injury: Teeth and Oropharynx as per pre-operative assessment

## 2024-07-30 NOTE — Interval H&P Note (Signed)
 S: patient awake and alert, denies any chest pain, feeling well. No fevers or chills, no cough. Played volleyball yesterday.  O: Vitals:   07/30/24 0804  BP: (!) 165/62  Pulse: 62  Resp: 18  Temp: 97.8 F (36.6 C)  SpO2: 96%   General: Well-appearing and in no distress. Well-nourished Eyes: Anicteric, no conjunctival pallor HEENT: Mucous membranes moist, no evidence of postnasal drip Respiratory: Trachea is midline, no respiratory distress, good bilateral air entry, no wheezes, rales, or rhonchi Cardiovascular: Heart with regular rate and rhythm, normal S1 and S2, no murmurs, rubs, or gallops Gastrointestinal: Normoactive bowel sounds, soft and nontender Musculoskeletal: No clubbing or digital cyanosis, normal range of motion Neuro: Alert and oriented, no gross focal deficits  A/P: 83 year old male presenting with a RML lung mass for repeat biopsy. Prior biopsy with atypical cells. He is planned for flexible bronchoscopy with EBUS/TBNA of RML mass. He is appropriate for the procedure and consents to proceed after risks and benefits explained. All the questions were answered.  [name], MD [facility] 07/30/2024 8:57 AM

## 2024-07-30 NOTE — Op Note (Signed)
 Flexible and EBUS Bronchoscopy Procedure Note  Edward [name]  [id number]  09/21/1940  Date:07/30/24  Time:10:40 AM   Provider Performing:Kavon Valenza   Procedure: Flexible bronchoscopy and EBUS Bronchoscopy  Indication(s) RML Mass  Consent Risks of the procedure as well as the alternatives and risks of each were explained to the patient and/or caregiver.  Consent for the procedure was obtained.  Anesthesia General Anesthesia   Time Out Verified patient identification, verified procedure, site/side was marked, verified correct patient position, special equipment/implants available, medications/allergies/relevant history reviewed, required imaging and test results available.   Sterile Technique Usual hand hygiene, masks, gowns, and gloves were used   Procedure Description Diagnostic bronchoscope advanced through endotracheal tube and into airway.  Airways were examined down to subsegmental level with findings noted below. The diagnostic bronchoscope was then removed and the EBUS bronchoscope was advanced into airway with the RML mass and station 11R inferior biopsied with a 25G Olympus ViziShot 2 needle and sent for slide, cell block, and culture. After our initial biopsy using a 21G needle, there was bleeding in the airway. This was treated with topical administration of TXA (500 mg) and 2% epinephrine. The bleeding subsided. Following that we switched to the 25G needle The EBUS bronchoscope was removed after assuring no active bleeding from biopsy site.  The flexible bronchoscope was re-introduced, and a brushing was performed in the RML and sent for cytology. A BAL was performed in the RLL and sent for cytology.  Findings: Tracheobronchial tree examined to the segmental level. There were no endobronchial lesions or secretions in the left tracheobronchial tree. There was extrinsic compression of the RML and RLL bronchi without endobronchial lesions. EBUS performed at RML mass  and 11R inferior. Brushing in RML. BAL in RLL.  Main Carina    LUL    Lingula    LLL    RUL    Bronchus Intermedius/RML    EBUS to RML mass    EBUS to 11R inferior    Complications/Tolerance None; patient tolerated the procedure well. Chest X-ray is needed post procedure.   EBL Minimal   Specimen(s) RML mass EBUS 11R inferior EBUS RML brushing for cytology RLL BAL for cytology  [name], MD [facility] 07/30/2024 10:49 AM

## 2024-07-30 NOTE — Anesthesia Preprocedure Evaluation (Signed)
 Anesthesia Evaluation  Patient identified by MRN, date of birth, ID band Patient awake    Reviewed: Allergy & Precautions, NPO status , Patient's Chart, lab work & pertinent test results  History of Anesthesia Complications Negative for: history of anesthetic complications  Airway Mallampati: II  TM Distance: >3 FB Neck ROM: full    Dental no notable dental hx.    Pulmonary neg pulmonary ROS, former smoker   Pulmonary exam normal        Cardiovascular hypertension, Pt. on medications + Peripheral Vascular Disease  Normal cardiovascular exam+ dysrhythmias      Neuro/Psych negative neurological ROS  negative psych ROS   GI/Hepatic negative GI ROS, Neg liver ROS,GERD  ,,  Endo/Other  negative endocrine ROSdiabetes, Type 2    Renal/GU Renal disease  negative genitourinary   Musculoskeletal   Abdominal   Peds  Hematology negative hematology ROS (+)   Anesthesia Other Findings Past Medical History: No date: BPH (benign prostatic hyperplasia) No date: Cataract     Comment:  surgical removal bilateral No date: CKD (chronic kidney disease) stage 3, GFR 30-59 ml/min (HCC) No date: DM (diabetes mellitus), type 2 (HCC) No date: ED (erectile dysfunction) No date: GERD (gastroesophageal reflux disease) 11/02/2017: History of adenomatous polyp of colon     Comment:  Tubular adenoma 2013; recommended 5 year follow up No date: History of kidney stones No date: Hyperlipidemia No date: Hypertension No date: IBS (irritable bowel syndrome) 02/11/2020: Kidney stones No date: LBBB (left bundle branch block) No date: Lung nodule No date: Personal history of kidney stones No date: Vitamin D  deficiency  Past Surgical History: 2015: CATARACT EXTRACTION; Right 2013: CATARACT EXTRACTION W/ INTRAOCULAR LENS IMPLANT     Comment:  left 2013: COLONOSCOPY     Comment:  TA 12/11/2017: EXTRACORPOREAL SHOCK WAVE LITHOTRIPSY; Left      Comment:  Procedure: LEFT EXTRACORPOREAL SHOCK WAVE LITHOTRIPSY               (ESWL);  Surgeon: Carolee Sherwood JONETTA DOUGLAS, MD;  Location: [facility]               ORS;  Service: Urology;  Laterality: Left; No date: KIDNEY STONE SURGERY No date: POLYPECTOMY 07/18/2024: VIDEO BRONCHOSCOPY WITH ENDOBRONCHIAL ULTRASOUND; Bilateral     Comment:  Procedure: BRONCHOSCOPY, WITH EBUS;  Surgeon: Catherine Cools, MD;  Location: [facility];  Service: Pulmonary;               Laterality: Bilateral;  BMI    Body Mass Index: 23.95 kg/m      Reproductive/Obstetrics negative OB ROS                              Anesthesia Physical Anesthesia Plan  ASA: 3  Anesthesia Plan: General ETT   Post-op Pain Management: Toradol  IV (intra-op)* and Ofirmev IV (intra-op)*   Induction: Intravenous  PONV Risk Score and Plan: 2 and Ondansetron, Dexamethasone, Treatment may vary due to age or medical condition, Propofol infusion and TIVA  Airway Management Planned: Oral ETT  Additional Equipment:   Intra-op Plan:   Post-operative Plan: Extubation in OR  Informed Consent: I have reviewed the patients History and Physical, chart, labs and discussed the procedure including the risks, benefits and alternatives for the proposed anesthesia with the patient or authorized representative who has indicated his/her understanding and acceptance.  Dental Advisory Given  Plan Discussed with: Anesthesiologist, CRNA and Surgeon  Anesthesia Plan Comments: (Patient consented for risks of anesthesia including but not limited to:  - adverse reactions to medications - damage to eyes, teeth, lips or other oral mucosa - nerve damage due to positioning  - sore throat or hoarseness - Damage to heart, brain, nerves, lungs, other parts of body or loss of life  Patient voiced understanding and assent.)        Anesthesia Quick Evaluation

## 2024-07-31 ENCOUNTER — Inpatient Hospital Stay: Admitting: Physician Assistant

## 2024-07-31 ENCOUNTER — Encounter: Payer: Self-pay | Admitting: Student in an Organized Health Care Education/Training Program

## 2024-07-31 ENCOUNTER — Other Ambulatory Visit (HOSPITAL_COMMUNITY): Payer: Self-pay

## 2024-07-31 ENCOUNTER — Ambulatory Visit (INDEPENDENT_AMBULATORY_CARE_PROVIDER_SITE_OTHER): Admitting: Otolaryngology

## 2024-07-31 ENCOUNTER — Inpatient Hospital Stay

## 2024-07-31 VITALS — BP 133/68 | HR 52 | Temp 97.8°F | Ht 69.0 in | Wt 162.0 lb

## 2024-07-31 DIAGNOSIS — J387 Other diseases of larynx: Secondary | ICD-10-CM

## 2024-07-31 NOTE — Progress Notes (Signed)
 Reason for Consult: Vocal cord lesion Referring Physician: Dr. Lendia Scurry Edward [name] is an 83 y.o. male.  HPI: They are here for evaluation of a cyst in the larynx.  He is unclear what the lesion is like.  It was labeled a false cord lesion.  He does not have any change in his voice.  He has no dysphagia or odynophagia.  No pain of the throat.  This apparently was found during a bronchoscopy for his biopsies of a lung mass that he is currently getting treated.  Past Medical History:  Diagnosis Date   BPH (benign prostatic hyperplasia)    Cataract    surgical removal bilateral   CKD (chronic kidney disease) stage 3, GFR 30-59 ml/min (HCC)    DM (diabetes mellitus), type 2 (HCC)    ED (erectile dysfunction)    GERD (gastroesophageal reflux disease)    History of adenomatous polyp of colon 11/02/2017   Tubular adenoma 2013; recommended 5 year follow up   History of kidney stones    Hyperlipidemia    Hypertension    IBS (irritable bowel syndrome)    Kidney stones 02/11/2020   LBBB (left bundle branch block)    Lung nodule    Personal history of kidney stones    Vitamin D  deficiency     Past Surgical History:  Procedure Laterality Date   CATARACT EXTRACTION Right 2015   CATARACT EXTRACTION W/ INTRAOCULAR LENS IMPLANT  2013   left   COLONOSCOPY  2013   TA   ENDOBRONCHIAL ULTRASOUND Bilateral 07/30/2024   Procedure: ENDOBRONCHIAL ULTRASOUND (EBUS);  Surgeon: Isadora Hose, MD;  Location: [facility] ORS;  Service: Pulmonary;  Laterality: Bilateral;   EXTRACORPOREAL SHOCK WAVE LITHOTRIPSY Left 12/11/2017   Procedure: LEFT EXTRACORPOREAL SHOCK WAVE LITHOTRIPSY (ESWL);  Surgeon: Carolee Sherwood JONETTA DOUGLAS, MD;  Location: [facility];  Service: Urology;  Laterality: Left;   KIDNEY STONE SURGERY     POLYPECTOMY     VIDEO BRONCHOSCOPY WITH ENDOBRONCHIAL ULTRASOUND Bilateral 07/18/2024   Procedure: BRONCHOSCOPY, WITH EBUS;  Surgeon: Catherine Cools, MD;  Location: [facility];  Service: Pulmonary;   Laterality: Bilateral;    Family History  Problem Relation Age of Onset   Diabetes Sister    Hypertension Sister    Heart disease Mother    Diabetes Sister    Hypertension Sister    Colon cancer Neg Hx    Colon polyps Neg Hx    Esophageal cancer Neg Hx    Rectal cancer Neg Hx    Stomach cancer Neg Hx     Social History:  reports that he quit smoking about 56 years ago. His smoking use included cigarettes. He started smoking about 66 years ago. He has a 5 pack-year smoking history. He has been exposed to tobacco smoke. He has never used smokeless tobacco. He reports that he does not drink alcohol and does not use drugs.  Allergies:  Allergies  Allergen Reactions   Bystolic [Nebivolol Hcl]     Medications: I have reviewed the patient's current medications.  Results for orders placed or performed during the hospital encounter of 07/30/24 (from the past 48 hours)  Glucose, capillary     Status: Abnormal   Collection Time: 07/30/24  7:50 AM  Result Value Ref Range   Glucose-Capillary 111 (H) 70 - 99 mg/dL    Comment: Glucose reference range applies only to samples taken after fasting for at least 8 hours.   Comment 1 Notify RN    Comment 2  Document in Chart   Glucose, capillary     Status: Abnormal   Collection Time: 07/30/24 10:51 AM  Result Value Ref Range   Glucose-Capillary 125 (H) 70 - 99 mg/dL    Comment: Glucose reference range applies only to samples taken after fasting for at least 8 hours.  Aerobic/Anaerobic Culture w Gram Stain (surgical/deep wound)     Status: None (Preliminary result)   Collection Time: 07/30/24 10:58 AM   Specimen: NEEDLE ASPIRATE; Lymph Node  Result Value Ref Range   Specimen Description NEEDLE ASPIRATE    Special Requests FNA  RML    Gram Stain NO WBC SEEN NO ORGANISMS SEEN     Culture      NO GROWTH < 24 HOURS Performed at [facility], [address], [place], [place] [address]    Report Status PENDING     DG Chest Port 1  View Result Date: 07/30/2024 EXAM: 1 VIEW(S) XRAY OF THE CHEST 07/30/2024 11:08:51 AM COMPARISON: 06/27/2024 CLINICAL HISTORY: S/P Bronchoscopy FINDINGS: LUNGS AND PLEURA: Right lower lobe infrahilar masslike fullness persists. Low lung volumes with crowding of vasculature and potentially atelectasis in the left lower lobe. Previous blunting of the right lateral costophrenic angle has resolved. No pleural effusion. No pneumothorax. HEART AND MEDIASTINUM: No acute abnormality of the cardiac and mediastinal silhouettes. BONES AND SOFT TISSUES: Thoracic spondylosis. No acute osseous abnormality. IMPRESSION: 1. Right lower lobe infrahilar masslike fullness persists. 2. Low lung volumes with crowding of vasculature and probable left lower lobe atelectasis. 3. Thoracic spondylosis. Electronically signed by: Ryan Salvage MD 07/30/2024 11:31 AM EST RP Workstation: [id number]    ROS There were no vitals taken for this visit. Physical Exam Constitutional:      Appearance: Normal appearance.  HENT:     Head: Normocephalic and atraumatic.     Right Ear: Tympanic membrane is without lesions and middle ear aerated, ear canal and external ear normal.     Left Ear: Tympanic membrane is without lesions and middle ear aerated, ear canal and external ear normal.     Nose: Nose normal. Turbinates with mild hypertrophy, No significant swelling or masses.     Oral cavity/oropharynx: Mucous membranes are moist. No lesions or masses    Larynx: normal voice. Mirror attempted without success    Eyes:     Extraocular Movements: Extraocular movements intact.     Conjunctiva/sclera: Conjunctivae normal.     Pupils: Pupils are equal, round, and reactive to light.  Cardiovascular:     Rate and Rhythm: Normal rate.  Pulmonary:     Effort: Pulmonary effort is normal.  Musculoskeletal:     Cervical back: Normal range of motion and neck supple. No rigidity.  Lymphadenopathy:     Cervical: No cervical adenopathy or  masses.salivary glands without lesions. .  Neurological:     Mental Status: He is alert. CN 2-12 intact. No nystagmus   Flexible fibroptic laryngoscopy  Patient was informed of risks, benefits, and options. All questions answered. Consent obtained.   The scope was passed through the nose and tracked into the nasopharynx. The nasopharynx without lesions or masses. The scope was positioned over the base of tongue and epiglottis. There was no obvious lesions or significant swelling and any of the laryngeal or pharyngeal structures except he does have a smooth yellowish vallecular cyst on the right side.  It does not distort the epiglottis.  There is no airway issues.  The vocal cords move well.. The subglottis has minimal  visualization but no lesions identified. The scope was removed without difficulty and patient tolerated well.     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  We talked about this lesion and that it does not have to have any intervention.  It is unlikely to get significantly larger but we can check it again in 3 or 4 months to be sure.  He is in the midst of a complicated lung mass workup and treatment so not introducing another surgery right now would be appropriate.  He does not want to have it removed.  Norleen Notice 07/31/2024, 10:45 AM

## 2024-08-02 LAB — ACID FAST SMEAR (AFB, MYCOBACTERIA): Acid Fast Smear: NEGATIVE

## 2024-08-04 ENCOUNTER — Other Ambulatory Visit (HOSPITAL_COMMUNITY): Payer: Self-pay

## 2024-08-04 LAB — AEROBIC/ANAEROBIC CULTURE W GRAM STAIN (SURGICAL/DEEP WOUND)
Culture: NO GROWTH
Gram Stain: NONE SEEN

## 2024-08-05 ENCOUNTER — Encounter: Admitting: Acute Care

## 2024-08-05 ENCOUNTER — Other Ambulatory Visit: Payer: Self-pay | Admitting: Acute Care

## 2024-08-05 ENCOUNTER — Other Ambulatory Visit: Payer: Self-pay

## 2024-08-05 ENCOUNTER — Inpatient Hospital Stay: Attending: Internal Medicine

## 2024-08-05 ENCOUNTER — Inpatient Hospital Stay: Admitting: Internal Medicine

## 2024-08-05 ENCOUNTER — Other Ambulatory Visit (HOSPITAL_COMMUNITY): Payer: Self-pay

## 2024-08-05 ENCOUNTER — Inpatient Hospital Stay

## 2024-08-05 VITALS — BP 112/72 | HR 70 | Temp 98.2°F | Resp 17 | Ht 69.0 in | Wt 162.0 lb

## 2024-08-05 DIAGNOSIS — C7A8 Other malignant neuroendocrine tumors: Secondary | ICD-10-CM | POA: Insufficient documentation

## 2024-08-05 DIAGNOSIS — C7A090 Malignant carcinoid tumor of the bronchus and lung: Secondary | ICD-10-CM | POA: Insufficient documentation

## 2024-08-05 DIAGNOSIS — C3490 Malignant neoplasm of unspecified part of unspecified bronchus or lung: Secondary | ICD-10-CM

## 2024-08-05 DIAGNOSIS — Z7984 Long term (current) use of oral hypoglycemic drugs: Secondary | ICD-10-CM | POA: Insufficient documentation

## 2024-08-05 DIAGNOSIS — D499 Neoplasm of unspecified behavior of unspecified site: Secondary | ICD-10-CM

## 2024-08-05 DIAGNOSIS — N183 Chronic kidney disease, stage 3 unspecified: Secondary | ICD-10-CM | POA: Insufficient documentation

## 2024-08-05 DIAGNOSIS — Z860101 Personal history of adenomatous and serrated colon polyps: Secondary | ICD-10-CM | POA: Insufficient documentation

## 2024-08-05 DIAGNOSIS — J988 Other specified respiratory disorders: Secondary | ICD-10-CM | POA: Insufficient documentation

## 2024-08-05 DIAGNOSIS — I2510 Atherosclerotic heart disease of native coronary artery without angina pectoris: Secondary | ICD-10-CM | POA: Insufficient documentation

## 2024-08-05 DIAGNOSIS — Z79899 Other long term (current) drug therapy: Secondary | ICD-10-CM | POA: Insufficient documentation

## 2024-08-05 DIAGNOSIS — Z87442 Personal history of urinary calculi: Secondary | ICD-10-CM | POA: Insufficient documentation

## 2024-08-05 DIAGNOSIS — N400 Enlarged prostate without lower urinary tract symptoms: Secondary | ICD-10-CM | POA: Insufficient documentation

## 2024-08-05 DIAGNOSIS — E785 Hyperlipidemia, unspecified: Secondary | ICD-10-CM | POA: Insufficient documentation

## 2024-08-05 DIAGNOSIS — E1122 Type 2 diabetes mellitus with diabetic chronic kidney disease: Secondary | ICD-10-CM | POA: Insufficient documentation

## 2024-08-05 DIAGNOSIS — I1310 Hypertensive heart and chronic kidney disease without heart failure, with stage 1 through stage 4 chronic kidney disease, or unspecified chronic kidney disease: Secondary | ICD-10-CM | POA: Insufficient documentation

## 2024-08-05 LAB — CBC WITH DIFFERENTIAL (CANCER CENTER ONLY)
Abs Immature Granulocytes: 0.01 K/uL (ref 0.00–0.07)
Basophils Absolute: 0 K/uL (ref 0.0–0.1)
Basophils Relative: 0 %
Eosinophils Absolute: 0.1 K/uL (ref 0.0–0.5)
Eosinophils Relative: 2 %
HCT: 44.3 % (ref 39.0–52.0)
Hemoglobin: 14.4 g/dL (ref 13.0–17.0)
Immature Granulocytes: 0 %
Lymphocytes Relative: 20 %
Lymphs Abs: 1.2 K/uL (ref 0.7–4.0)
MCH: 28.8 pg (ref 26.0–34.0)
MCHC: 32.5 g/dL (ref 30.0–36.0)
MCV: 88.6 fL (ref 80.0–100.0)
Monocytes Absolute: 0.5 K/uL (ref 0.1–1.0)
Monocytes Relative: 8 %
Neutro Abs: 4.3 K/uL (ref 1.7–7.7)
Neutrophils Relative %: 70 %
Platelet Count: 163 K/uL (ref 150–400)
RBC: 5 MIL/uL (ref 4.22–5.81)
RDW: 13.8 % (ref 11.5–15.5)
WBC Count: 6.2 K/uL (ref 4.0–10.5)
nRBC: 0 % (ref 0.0–0.2)

## 2024-08-05 LAB — CMP (CANCER CENTER ONLY)
ALT: 9 U/L (ref 0–44)
AST: 13 U/L — ABNORMAL LOW (ref 15–41)
Albumin: 4 g/dL (ref 3.5–5.0)
Alkaline Phosphatase: 83 U/L (ref 38–126)
Anion gap: 8 (ref 5–15)
BUN: 14 mg/dL (ref 8–23)
CO2: 26 mmol/L (ref 22–32)
Calcium: 8.8 mg/dL — ABNORMAL LOW (ref 8.9–10.3)
Chloride: 107 mmol/L (ref 98–111)
Creatinine: 1.28 mg/dL — ABNORMAL HIGH (ref 0.61–1.24)
GFR, Estimated: 56 mL/min — ABNORMAL LOW (ref >60)
Glucose, Bld: 139 mg/dL — ABNORMAL HIGH (ref 70–99)
Potassium: 4.3 mmol/L (ref 3.5–5.1)
Sodium: 141 mmol/L (ref 135–145)
Total Bilirubin: 0.6 mg/dL (ref 0.0–1.2)
Total Protein: 6.4 g/dL — ABNORMAL LOW (ref 6.5–8.1)

## 2024-08-05 LAB — CYTOLOGY - NON PAP

## 2024-08-05 MED ORDER — ERYTHROMYCIN 5 MG/GM OP OINT: 1.0000 | TOPICAL_OINTMENT | Freq: Two times a day (BID) | OPHTHALMIC | 2 refills | Status: DC

## 2024-08-05 NOTE — Progress Notes (Signed)
 [facility] [phone]   [phone]  OFFICE PROGRESS NOTE  [name], NP-C [address] [place] [address]  DIAGNOSIS:  Stage IIIC (T3, N3, M0) small cell lung cancer, well-differentiated neuroendocrine tumor, suspicious for atypical carcinoid presented with right suprahilar mass with occlusion of the right middle lobe and constriction of the right lower lobe bronchus in addition to hypermetabolic lymph node extending to the contralateral mediastinum and possibly left internal jugular station.  Diagnosed in November 2025  PRIOR THERAPY: None  CURRENT THERAPY: None  INTERVAL HISTORY: Edward [name] 83 y.o. male returns to the clinic today for follow-up visit accompanied by his wife and son. Discussed the use of AI scribe software for clinical note transcription with the patient, who gave verbal consent to proceed.  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 He is accompanied by his wife and son.  He was diagnosed with stage 3c atypical carcinoid of the lung in November 2025, presenting with a right suprahilar mass causing occlusion of the right middle lobe and constriction of the right lower lobe bronchus. A bronchoscopy confirmed the diagnosis of neuroendocrine carcinoma of the lung, specifically atypical carcinoid, which is considered intermediate in aggressiveness.  He feels good overall with no significant complaints. No worsening of breathing issues, hemoptysis, or significant cough, although he notes a dry cough that has persisted since the bronchoscopy. No hot flashes or diarrhea. He remains active and inquires about playing volleyball.  He has not noticed any spread of the cancer beyond the initial diagnosis.     MEDICAL HISTORY: Past Medical History:  Diagnosis Date   BPH (benign prostatic  hyperplasia)    Cataract    surgical removal bilateral   CKD (chronic kidney disease) stage 3, GFR 30-59 ml/min (HCC)    DM (diabetes mellitus), type 2 (HCC)    ED (erectile dysfunction)    GERD (gastroesophageal reflux disease)    History of adenomatous polyp of colon 11/02/2017   Tubular adenoma 2013; recommended 5 year follow up   History of kidney stones    Hyperlipidemia    Hypertension    IBS (irritable bowel syndrome)    Kidney stones 02/11/2020   LBBB (left bundle branch block)    Lung nodule    Personal history of kidney stones    Vitamin D  deficiency     ALLERGIES:  is allergic to bystolic [nebivolol hcl].  MEDICATIONS:  Current Outpatient Medications  Medication Sig Dispense Refill   APPLE CIDER VINEGAR PO Take 1-2 tablets by mouth daily.     aspirin 81 MG tablet Take 81 mg by mouth 3 (three) times a week.     atorvastatin  (LIPITOR) 40 MG tablet Take 1 tablet (40 mg total) by mouth every evening for cholesterol. 270 tablet 0   Cholecalciferol (VITAMIN D  PO) Take 5,000 Units by mouth daily.     CINNAMON PO Take 1-2 tablets by mouth daily.     Cyanocobalamin (VITAMIN B 12 PO) Take 1 tablet by mouth daily.     empagliflozin  (JARDIANCE ) 10 MG TABS tablet Take 1 tablet (10 mg total) by mouth daily. 90 tablet 0   erythromycin  ophthalmic ointment Apply ointment to eye sutures twice daily for 14 days 3.5 g 2   famotidine  (PEPCID ) 40 MG tablet Take 1 tablet (40 mg total) by mouth  every evening to prevent heartburn and indigestion. 90 tablet 0   fluticasone  (FLONASE ) 50 MCG/ACT nasal spray Place 1-2 sprays into both nostrils daily. (Patient taking differently: Place 1-2 sprays into both nostrils as needed.) 16 g 1   glucose blood (ONETOUCH VERIO) test strip USE TO TEST BLOOD SUGAR ONCE DAILY 100 strip 12   losartan  (COZAAR ) 50 MG tablet Take 1 tablet (50 mg total) by mouth daily for blood pressure. (Patient taking differently: Take 50 mg by mouth every morning.) 90 tablet 0    metFORMIN  (GLUCOPHAGE ) 500 MG tablet Take 1 tablet (500 mg total) by mouth 3 (three) times daily with meals for diabetes. 270 tablet 0   OMEGA-3 FATTY ACIDS PO Take 700 mg by mouth 3 (three) times a week.     pantoprazole  (PROTONIX ) 40 MG tablet Take 1 tablet (40 mg total) by mouth every evening to prevent heartburn and indigestion. 90 tablet 0   tamsulosin  (FLOMAX ) 0.4 MG CAPS capsule Take 1 capsule (0.4 mg total) by mouth every evening for prostate. 90 capsule 0   tizanidine  (ZANAFLEX ) 2 MG capsule Take 1 capsule (2 mg total) by mouth 2 (two) times daily as needed for muscle spasms. 30 capsule 0   zinc gluconate 50 MG tablet Take 50 mg by mouth daily.     No current facility-administered medications for this visit.    SURGICAL HISTORY:  Past Surgical History:  Procedure Laterality Date   CATARACT EXTRACTION Right 2015   CATARACT EXTRACTION W/ INTRAOCULAR LENS IMPLANT  2013   left   COLONOSCOPY  2013   TA   ENDOBRONCHIAL ULTRASOUND Bilateral 07/30/2024   Procedure: ENDOBRONCHIAL ULTRASOUND (EBUS);  Surgeon: Isadora Hose, MD;  Location: [facility];  Service: Pulmonary;  Laterality: Bilateral;   EXTRACORPOREAL SHOCK WAVE LITHOTRIPSY Left 12/11/2017   Procedure: LEFT EXTRACORPOREAL SHOCK WAVE LITHOTRIPSY (ESWL);  Surgeon: Carolee Sherwood JONETTA DOUGLAS, MD;  Location: [facility];  Service: Urology;  Laterality: Left;   KIDNEY STONE SURGERY     POLYPECTOMY     VIDEO BRONCHOSCOPY WITH ENDOBRONCHIAL ULTRASOUND Bilateral 07/18/2024   Procedure: BRONCHOSCOPY, WITH EBUS;  Surgeon: Catherine Cools, MD;  Location: [facility];  Service: Pulmonary;  Laterality: Bilateral;    REVIEW OF SYSTEMS:  Constitutional: positive for fatigue Eyes: negative Ears, nose, mouth, throat, and face: negative Respiratory: positive for cough Cardiovascular: negative Gastrointestinal: negative Genitourinary:negative Integument/breast: negative Hematologic/lymphatic: negative Musculoskeletal:negative Neurological:  negative Behavioral/Psych: negative Endocrine: negative Allergic/Immunologic: negative   PHYSICAL EXAMINATION: General appearance: alert, cooperative, fatigued, and no distress Head: Normocephalic, without obvious abnormality, atraumatic Neck: no adenopathy, no JVD, supple, symmetrical, trachea midline, and thyroid  not enlarged, symmetric, no tenderness/mass/nodules Lymph nodes: Cervical, supraclavicular, and axillary nodes normal. Resp: wheezes RML Back: symmetric, no curvature. ROM normal. No CVA tenderness. Cardio: regular rate and rhythm, S1, S2 normal, no murmur, click, rub or gallop GI: soft, non-tender; bowel sounds normal; no masses,  no organomegaly Extremities: extremities normal, atraumatic, no cyanosis or edema Neurologic: Alert and oriented X 3, normal strength and tone. Normal symmetric reflexes. Normal coordination and gait  ECOG PERFORMANCE STATUS: 1 - Symptomatic but completely ambulatory  Blood pressure 112/72, pulse 70, temperature 98.2 F (36.8 C), temperature source Temporal, resp. rate 17, height 5' 9 (1.753 m), weight 162 lb (73.5 kg), SpO2 96%.  LABORATORY DATA: Lab Results  Component Value Date   WBC 6.2 08/05/2024   HGB 14.4 08/05/2024   HCT 44.3 08/05/2024   MCV 88.6 08/05/2024   PLT 163 08/05/2024  Chemistry      Component Value Date/Time   NA 141 08/05/2024 1424   K 4.3 08/05/2024 1424   CL 107 08/05/2024 1424   CO2 26 08/05/2024 1424   BUN 14 08/05/2024 1424   CREATININE 1.28 (H) 08/05/2024 1424   CREATININE 1.34 (H) 08/22/2023 1156      Component Value Date/Time   CALCIUM  8.8 (L) 08/05/2024 1424   ALKPHOS 83 08/05/2024 1424   AST 13 (L) 08/05/2024 1424   ALT 9 08/05/2024 1424   BILITOT 0.6 08/05/2024 1424       RADIOGRAPHIC STUDIES: DG Chest Port 1 View Result Date: 07/30/2024 EXAM: 1 VIEW(S) XRAY OF THE CHEST 07/30/2024 11:08:51 AM COMPARISON: 06/27/2024 CLINICAL HISTORY: S/P Bronchoscopy FINDINGS: LUNGS AND PLEURA: Right  lower lobe infrahilar masslike fullness persists. Low lung volumes with crowding of vasculature and potentially atelectasis in the left lower lobe. Previous blunting of the right lateral costophrenic angle has resolved. No pleural effusion. No pneumothorax. HEART AND MEDIASTINUM: No acute abnormality of the cardiac and mediastinal silhouettes. BONES AND SOFT TISSUES: Thoracic spondylosis. No acute osseous abnormality. IMPRESSION: 1. Right lower lobe infrahilar masslike fullness persists. 2. Low lung volumes with crowding of vasculature and probable left lower lobe atelectasis. 3. Thoracic spondylosis. Electronically signed by: Ryan Salvage MD 07/30/2024 11:31 AM EST RP Workstation: [id number]   MR BRAIN W WO CONTRAST Result Date: 07/26/2024 EXAM: MRI BRAIN WITH AND WITHOUT CONTRAST 07/22/2024 01:21:28 PM TECHNIQUE: Multiplanar multisequence MRI of the head/brain was performed with and without the administration of 7 mL gadobutrol  (GADAVIST ) 1 MMOL/ML injection. COMPARISON: None available. CLINICAL HISTORY: Non-small cell lung cancer (NSCLC), staging. FINDINGS: BRAIN AND VENTRICLES: There is age-related cerebral volume loss present and there is mild-to-moderate cerebral white matter disease. There are no findings concerning for metastatic disease. No acute infarct. No acute intracranial hemorrhage. No mass effect or midline shift. No hydrocephalus. The sella is unremarkable. Normal flow voids. No mass or abnormal enhancement. ORBITS: No acute abnormality. SINUSES: No acute abnormality. BONES AND SOFT TISSUES: Normal bone marrow signal and enhancement. No acute soft tissue abnormality. IMPRESSION: 1. No acute intracranial abnormality. 2. No findings concerning for metastatic disease. 3. Age-related cerebral volume loss and mild-to-moderate cerebral white matter disease. Electronically signed by: Evalene Coho MD 07/26/2024 08:58 AM EST RP Workstation: [id number]   NM PET Image Initial (PI) Skull Base To  Thigh (F-18 FDG) Result Date: 07/24/2024 CLINICAL DATA:  Initial treatment strategy for lung nodule. EXAM: NUCLEAR MEDICINE PET SKULL BASE TO THIGH TECHNIQUE: 8.7 mCi F-18 FDG was injected intravenously. Full-ring PET imaging was performed from the skull base to thigh after the radiotracer. CT data was obtained and used for attenuation correction and anatomic localization. Fasting blood glucose: 125 mg/dl COMPARISON:  CT chest abdomen pelvis 07/02/2024. FINDINGS: Mediastinal blood pool activity: SUV max 2.1 Liver activity: SUV max NA NECK: No abnormal hypermetabolism. Incidental CT findings: None. CHEST: A mildly hypermetabolic mass is seen predominantly in the perihilar right middle lobe with some involvement of the superior segment right lower lobe, 5.6 x 6.2 cm (6/67), SUV max 3.2. Right hilar lymph node measures 8 mm, SUV max 2.6. Low left paratracheal lymph nodes measure up to 12 mm (6/55), SUV max 2.9. Low left internal jugular lymph node measures 7 mm, SUV max 2.2. Incidental CT findings: Atherosclerotic calcification of the aorta and coronary arteries. Heart is enlarged. Small pericardial effusion. Aforementioned mass obstructs the right middle lobe bronchus with associated collapse of the right middle lobe. Associated narrowing of  the left lower lobe bronchus. Small right pleural effusion. ABDOMEN/PELVIS: Focal anorectal hypermetabolism, SUV max 9.4. No additional abnormal hypermetabolism. Incidental CT findings: There may be sludge in the gallbladder. Low-attenuation lesions in the kidneys. No specific follow-up necessary. Small right renal stone. Tiny hiatal hernia. Left inguinal hernia contains a knuckle of sigmoid colon. SKELETON: No abnormal hypermetabolism. Incidental CT findings: Bilateral L5 pars defects with severe grade 1 or mild grade 2 anterolisthesis of L5 on S1. Degenerative changes in the spine. IMPRESSION: 1. Mildly hypermetabolic right perihilar mass with hypermetabolic lymph nodes  extending to the contralateral mediastinum and possibly low left internal jugular station, compatible with T3N3M0 or stage IIIC primary bronchogenic carcinoma. 2. Focal anorectal hypermetabolism, indeterminate. Malignancy cannot be excluded. 3. Small pericardial effusion. 4. Small right pleural effusion. 5. Postobstructive right middle lobe collapse. 6. Small right renal stone. 7. Bilateral L5 pars defects with severe grade 1 or mild grade 2 anterolisthesis. 8. Aortic atherosclerosis (ICD10-I70.0). Coronary artery calcification. Electronically Signed   By: Newell Eke M.D.   On: 07/24/2024 09:35    ASSESSMENT AND PLAN: This is a very pleasant 83 years old white male with stage IIIC (T3, N3, M0) small cell lung cancer, well-differentiated neuroendocrine tumor, suspicious for atypical carcinoid presented with right suprahilar mass with occlusion of the right middle lobe and constriction of the right lower lobe bronchus in addition to hypermetabolic lymph node extending to the contralateral mediastinum and possibly left internal jugular station.  Diagnosed in November 2025. I had a lengthy discussion with the patient and his family today about his current disease status, prognosis and treatment options.  The patient is currently asymptomatic except for the cough. Assessment and Plan Assessment & Plan Stage 3c atypical carcinoid (neuroendocrine carcinoma) of right lung with airway obstruction Stage 3c atypical carcinoid of the right lung with airway obstruction due to a right suprahilar mass causing occlusion of the right middle lobe and constriction of the right lower lobe bronchus. The tumor is intermediate in aggressiveness. No significant symptoms currently, but airway obstruction is present. No metastasis to brain or other organs, but lymph nodes in the middle of the chest are involved. Treatment options include radiation therapy alone or in combination with chemotherapy with carboplatin and etoposide.  Chemotherapy may enhance radiation efficacy but carries risks, especially considering his age. He is in good health and can tolerate chemotherapy if necessary. - Referred to radiation oncologist for evaluation and potential radiation therapy to relieve airway obstruction. - Will discuss with radiation oncologist the possibility of adding chemotherapy to enhance radiation efficacy. - Scheduled follow-up appointment in two weeks to review radiation oncologist's recommendations and determine need for chemotherapy.  Dry cough Persistent dry cough since bronchoscopy, not associated with hemoptysis. No increase in shortness of breath or other respiratory symptoms. Cough is not currently bothersome. - Continue to monitor cough symptoms and reassess if he becomes bothersome or if new symptoms develop. The patient was advised to call immediately if he has any other concerning symptoms in the interval. The patient voices understanding of current disease status and treatment options and is in agreement with the current care plan.  All questions were answered. The patient knows to call the clinic with any problems, questions or concerns. We can certainly see the patient much sooner if necessary.  The total time spent in the appointment was 55 minutes including review of chart and various tests results, discussions about plan of care and coordination of care plan .   Disclaimer: This note  was dictated with voice recognition software. Similar sounding words can inadvertently be transcribed and may not be corrected upon review.

## 2024-08-05 NOTE — Progress Notes (Signed)
 This encounter was created in error - please disregard.

## 2024-08-06 ENCOUNTER — Other Ambulatory Visit (HOSPITAL_COMMUNITY): Payer: Self-pay

## 2024-08-06 ENCOUNTER — Telehealth: Payer: Self-pay

## 2024-08-06 ENCOUNTER — Other Ambulatory Visit: Payer: Self-pay | Admitting: Family Medicine

## 2024-08-06 ENCOUNTER — Other Ambulatory Visit: Payer: Self-pay

## 2024-08-06 DIAGNOSIS — I10 Essential (primary) hypertension: Secondary | ICD-10-CM

## 2024-08-06 MED ORDER — GLUCOSE BLOOD VI STRP
1.0000 | ORAL_STRIP | Freq: Every day | 12 refills | Status: AC
  Filled 2024-08-06 – 2024-10-01 (×2): qty 100, 100d supply, fill #0

## 2024-08-06 MED ORDER — ATORVASTATIN CALCIUM 40 MG PO TABS
40.0000 mg | ORAL_TABLET | Freq: Every evening | ORAL | 0 refills | Status: DC
  Filled 2024-08-16: qty 90, 90d supply, fill #0
  Filled ????-??-??: fill #0

## 2024-08-06 MED ORDER — TIZANIDINE HCL 2 MG PO CAPS: 2.0000 mg | ORAL_CAPSULE | Freq: Two times a day (BID) | ORAL | 0 refills | Status: DC | PRN

## 2024-08-06 NOTE — Telephone Encounter (Signed)
 Returned call and LVM for patient regarding questions about recently scheduled surgical consult. Wife stated that she was under the impression that patient would not require surgery based on yesterday's appointment with Dr. Sherrod.  LVM notifying patient that Dr. Sherrod would still advise that they keep surgical consult with Dr. Kerrin just to hear options and discuss plan of care.  Provided call back number should patient or family have any additional questions or concerns.

## 2024-08-06 NOTE — Telephone Encounter (Signed)
 Patient wife wanting a phone call in regards to next steps as they were told that due to the location surgery would not be an option and that radiation and chemo would be his best bet,mohammed reviewed the results with patient at appointment yesterday,wanting a little more clarification on what is next,Please advise and call back at [phone]

## 2024-08-07 ENCOUNTER — Telehealth: Payer: Self-pay | Admitting: Acute Care

## 2024-08-07 ENCOUNTER — Other Ambulatory Visit: Payer: Self-pay

## 2024-08-07 ENCOUNTER — Other Ambulatory Visit (HOSPITAL_COMMUNITY): Payer: Self-pay

## 2024-08-07 MED ORDER — EMPAGLIFLOZIN 10 MG PO TABS
10.0000 mg | ORAL_TABLET | Freq: Every day | ORAL | 0 refills | Status: DC
  Filled 2024-08-16: qty 90, 90d supply, fill #0

## 2024-08-07 MED ORDER — LOSARTAN POTASSIUM 50 MG PO TABS
50.0000 mg | ORAL_TABLET | Freq: Every day | ORAL | 0 refills | Status: AC
  Filled 2024-08-16: qty 90, 90d supply, fill #0
  Filled 2024-09-03: qty 30, 30d supply, fill #0
  Filled 2024-09-30: qty 90, 90d supply, fill #0

## 2024-08-07 NOTE — Telephone Encounter (Signed)
 I have called the patient and I have reviewed Dr. Clifm plan of care for chemo and radiation treatment. Pt. Wife wants to know if they need to see the surgeon they were referred to on 08/27/2024.I have reached out to Dr. Jeannett Nurse Navigator to  see if the surgical consult can be cancelled.  I also let them know the rad onc appointment has not been made , and his wife is anxious about waiting too long.  I explained to the patient's wife I have reached out to Dr. Jeannett Nurse Navigator for clarification of the plan. I am hopeful they can cancel the surgical consult.  Nothing further was needed.

## 2024-08-07 NOTE — Telephone Encounter (Signed)
 Yes we were waiting on his results,

## 2024-08-10 ENCOUNTER — Other Ambulatory Visit: Payer: Self-pay

## 2024-08-10 ENCOUNTER — Ambulatory Visit
Admission: EM | Admit: 2024-08-10 | Discharge: 2024-08-10 | Disposition: A | Discharge status: Home / Self Care | Attending: Nurse Practitioner | Admitting: Nurse Practitioner

## 2024-08-10 DIAGNOSIS — R531 Weakness: Secondary | ICD-10-CM

## 2024-08-10 DIAGNOSIS — R630 Anorexia: Secondary | ICD-10-CM

## 2024-08-10 DIAGNOSIS — R5383 Other fatigue: Secondary | ICD-10-CM

## 2024-08-10 DIAGNOSIS — R053 Chronic cough: Secondary | ICD-10-CM | POA: Diagnosis not present

## 2024-08-10 DIAGNOSIS — R0602 Shortness of breath: Secondary | ICD-10-CM

## 2024-08-10 MED ORDER — BENZONATATE 100 MG PO CAPS: 100.0000 mg | ORAL_CAPSULE | Freq: Three times a day (TID) | ORAL | 0 refills | Status: DC | PRN

## 2024-08-10 MED ORDER — DEXAMETHASONE SOD PHOSPHATE PF 10 MG/ML IJ SOLN
10.0000 mg | Freq: Once | INTRAMUSCULAR | Status: AC
  Administered 2024-08-10: 10 mg via INTRAMUSCULAR

## 2024-08-10 MED ORDER — ALBUTEROL SULFATE HFA 108 (90 BASE) MCG/ACT IN AERS: 2.0000 | INHALATION_SPRAY | RESPIRATORY_TRACT | 0 refills | Status: DC | PRN

## 2024-08-10 NOTE — Discharge Instructions (Addendum)
 You were seen today for ongoing cough, shortness of breath, fatigue, and poor appetite related to your recent lung cancer diagnosis and bronchoscopy. Your oxygen level and lung exam were reassuring today, and there were no signs of an acute infection or emergency. Your cough and breathing symptoms are likely related to airway irritation or inflammation. You were given a steroid injection in the clinic to help reduce airway swelling. Use your albuterol  inhaler as needed for wheezing or shortness of breath, following the directions on the inhaler. You may use Tessalon  Perles as prescribed to help calm the cough. Staying well hydrated with water, electrolyte drinks, warm teas, or broths can help thin mucus. Gentle steam or a cool mist humidifier may also ease coughing. Try to rest as much as possible.  Because your appetite has been poor and you have had weight loss, Jardiance  was stopped for now to see if this allows your appetite to improve. Continue taking your metformin  as directed. Focus on small, frequent meals even if you do not feel hungry. Examples include soups, scrambled eggs, yogurt, smoothies, toast, oatmeal, or soft foods. Drink Ensure or similar nutritional supplements three times daily to help maintain calories and protein intake. Continue to drink fluids regularly to avoid dehydration.  Follow up with your oncology team as scheduled to continue your cancer treatment planning, and follow up with your primary care provider to review your diabetes management and nutritional status after stopping Jardiance . Further decisions about restarting this medication will be made by your PCP or oncology team based on how you are doing. Go to the emergency department right away if you develop worsening or severe shortness of breath, new or crushing chest pain, dizziness or fainting, bluish lips or fingertips, coughing up blood, persistent vomiting, inability to keep fluids down, new fever or chills, severe  weakness, confusion, or any rapid or sudden worsening of your symptoms.

## 2024-08-10 NOTE — ED Triage Notes (Signed)
 Pt being seen in UC for cough, loss of appetite, shortness of breath, fatigue x5 days. Pt reports taking mucinex with no relief.

## 2024-08-10 NOTE — ED Provider Notes (Signed)
 Edward [name]    CSN: [id number] Arrival date & time: 08/10/24  1222      History   Chief Complaint Chief Complaint  Patient presents with   Cough   Shortness of Breath    HPI Edward [name] is a 83 y.o. male.   Discussed the use of AI scribe software for clinical note transcription with the patient, who gave verbal consent to proceed.   The patient presents with a five-day history of cough, decreased appetite, shortness of breath, and fatigue. He reports a persistent dry cough that is nonproductive and denies hemoptysis. He endorses wheezing and subjective fevers. He reports nasal congestion without rhinorrhea and a sore throat that has been present since undergoing bronchoscopy. He denies vomiting, diarrhea, or headaches. He notes increased sleep and fatigue.  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 He has had a persistent dry cough since the bronchoscopy. He was evaluated by oncology on November 24, at which time the cough was noted but without shortness of breath; however, he now reports development of shortness of breath since that visit. He is scheduled to begin radiation therapy and may require chemotherapy, with an upcoming consultation with radiation oncology to establish a definitive treatment plan.  The following sections of the patient's history were reviewed and updated as appropriate: allergies, current medications, past family history, past medical history, past social history, past surgical history, and problem list.      Past Medical History:  Diagnosis Date   BPH (benign prostatic hyperplasia)    Cataract    surgical removal bilateral   CKD (chronic kidney disease) stage 3, GFR 30-59 ml/min (HCC)    DM (diabetes mellitus), type 2 (HCC)    ED (erectile dysfunction)    GERD  (gastroesophageal reflux disease)    History of adenomatous polyp of colon 11/02/2017   Tubular adenoma 2013; recommended 5 year follow up   History of kidney stones    Hyperlipidemia    Hypertension    IBS (irritable bowel syndrome)    Kidney stones 02/11/2020   LBBB (left bundle branch block)    Lung nodule    Personal history of kidney stones    Vitamin D  deficiency     Patient Active Problem List   Diagnosis Date Noted   Primary malignant neuroendocrine neoplasm of lung (HCC) 08/05/2024   Lung nodule 07/25/2024   Rib pain on right side 06/27/2024   Muscle spasm 06/27/2024   Right lower lobe lung mass 06/27/2024   Allergic rhinoconjunctivitis 06/27/2024   Type 2 diabetes with circulatory disorder causing erectile dysfunction (HCC) 06/27/2024   Encounter for general adult medical examination with abnormal findings 03/12/2024   Erectile dysfunction due to type 2 diabetes mellitus (HCC) 04/09/2021   Former smoker 09/02/2018   FHx: heart disease 09/02/2018   CKD stage 3 due to type 2 diabetes mellitus (HCC) 11/02/2017   Type 2 diabetes mellitus with stage 3 chronic kidney disease, without long-term current use of insulin  (HCC) 11/01/2014   Overweight (BMI 25.0-29.9) 07/25/2014   Hyperlipidemia associated with type 2 diabetes mellitus (HCC)    Gastroesophageal reflux disease    Essential hypertension    Vitamin D  deficiency    BPH with obstruction/lower urinary tract symptoms     Past Surgical History:  Procedure Laterality Date   CATARACT EXTRACTION Right 2015   CATARACT EXTRACTION W/  INTRAOCULAR LENS IMPLANT  2013   left   COLONOSCOPY  2013   TA   ENDOBRONCHIAL ULTRASOUND Bilateral 07/30/2024   Procedure: ENDOBRONCHIAL ULTRASOUND (EBUS);  Surgeon: Isadora Hose, MD;  Location: [facility];  Service: Pulmonary;  Laterality: Bilateral;   EXTRACORPOREAL SHOCK WAVE LITHOTRIPSY Left 12/11/2017   Procedure: LEFT EXTRACORPOREAL SHOCK WAVE LITHOTRIPSY (ESWL);  Surgeon: Carolee Sherwood JONETTA DOUGLAS, MD;  Location: [facility];  Service: Urology;  Laterality: Left;   KIDNEY STONE SURGERY     POLYPECTOMY     VIDEO BRONCHOSCOPY WITH ENDOBRONCHIAL ULTRASOUND Bilateral 07/18/2024   Procedure: BRONCHOSCOPY, WITH EBUS;  Surgeon: Catherine Cools, MD;  Location: [facility];  Service: Pulmonary;  Laterality: Bilateral;       Home Medications    Prior to Admission medications   Medication Sig Start Date End Date Taking? Authorizing Provider  albuterol (VENTOLIN HFA) 108 (90 Base) MCG/ACT inhaler Inhale 2 puffs into the lungs every 4 (four) hours as needed for wheezing or shortness of breath. 08/10/24  Yes Iola Lukes, FNP  benzonatate (TESSALON) 100 MG capsule Take 1 capsule (100 mg total) by mouth 3 (three) times daily as needed for cough. 08/10/24  Yes Iola Lukes, FNP  APPLE CIDER VINEGAR PO Take 1-2 tablets by mouth daily.    [provider]  aspirin 81 MG tablet Take 81 mg by mouth 3 (three) times a week.    [provider]  atorvastatin  (LIPITOR) 40 MG tablet Take 1 tablet (40 mg total) by mouth every evening for cholesterol. 06/27/24   Alvia Corean CROME, FNP  atorvastatin  (LIPITOR) 40 MG tablet Take 1 tablet (40 mg total) by mouth every evening for cholesterol Patient not taking: Reported on 08/10/2024 05/30/24     Cholecalciferol (VITAMIN D  PO) Take 5,000 Units by mouth daily.    [provider]  CINNAMON PO Take 1-2 tablets by mouth daily.    [provider]  Cyanocobalamin (VITAMIN B 12 PO) Take 1 tablet by mouth daily.    [provider]  empagliflozin  (JARDIANCE ) 10 MG TABS tablet Take 1 tablet (10 mg total) by mouth daily. 08/07/24   [name], NP-C  erythromycin  ophthalmic ointment Apply ointment to eye sutures twice daily for 14 days 04/03/24     famotidine  (PEPCID ) 40 MG tablet Take 1 tablet (40 mg total) by mouth every evening to prevent heartburn and indigestion. 06/27/24   Alvia Corean CROME, FNP   fluticasone  (FLONASE ) 50 MCG/ACT nasal spray Place 1-2 sprays into both nostrils daily. Patient taking differently: Place 1-2 sprays into both nostrils as needed. 06/27/24   Alvia Corean CROME, FNP  glucose blood (ONETOUCH VERIO) test strip USE TO TEST BLOOD SUGAR ONCE DAILY 06/27/24   Alvia Corean L, FNP  glucose blood test strip Use 1 strip to check glucose once daily. 06/27/24   Alvia Corean CROME, FNP  losartan  (COZAAR ) 50 MG tablet Take 1 tablet (50 mg total) by mouth daily for blood pressure. 08/07/24   Henson, Vickie L, NP-C  metFORMIN  (GLUCOPHAGE ) 500 MG tablet Take 1 tablet (500 mg total) by mouth 3 (three) times daily with meals for diabetes. 06/27/24   Alvia Corean CROME, FNP  OMEGA-3 FATTY ACIDS PO Take 700 mg by mouth 3 (three) times a week.    [provider]  pantoprazole  (PROTONIX ) 40 MG tablet Take 1 tablet (40 mg total) by mouth every evening to prevent heartburn and indigestion. 07/16/24   Henson, Vickie L, NP-C  tamsulosin  (FLOMAX ) 0.4 MG CAPS  capsule Take 1 capsule (0.4 mg total) by mouth every evening for prostate. 06/27/24   Alvia Corean CROME, FNP  tizanidine  (ZANAFLEX ) 2 MG capsule Take 1 capsule (2 mg total) by mouth 2 (two) times daily as needed for muscle spasms Patient not taking: Reported on 08/10/2024 06/27/24   Alvia Corean CROME, FNP  zinc gluconate 50 MG tablet Take 50 mg by mouth daily.    [provider]    Family History Family History  Problem Relation Age of Onset   Diabetes Sister    Hypertension Sister    Heart disease Mother    Diabetes Sister    Hypertension Sister    Colon cancer Neg Hx    Colon polyps Neg Hx    Esophageal cancer Neg Hx    Rectal cancer Neg Hx    Stomach cancer Neg Hx     Social History Social History   Tobacco Use   Smoking status: Former    Current packs/day: 0.00    Average packs/day: 0.5 packs/day for 10.0 years (5.0 ttl pk-yrs)    Types: Cigarettes    Start date: 09/12/1957     Quit date: 09/13/1967    Years since quitting: 56.9    Passive exposure: Past   Smokeless tobacco: Never  Vaping Use   Vaping status: Never Used  Substance Use Topics   Alcohol  use: No   Drug use: No     Allergies   Bystolic [nebivolol hcl]   Review of Systems Review of Systems  Constitutional:  Positive for appetite change (decreased), chills, diaphoresis, fatigue and fever (subjective fever for 1-2 nights due to having chills and then sweats).  HENT:  Positive for congestion, sinus pressure and sore throat. Negative for rhinorrhea.   Respiratory:  Positive for cough (nonproductive), shortness of breath and wheezing.   Gastrointestinal:  Negative for diarrhea, nausea and vomiting.  Neurological:  Negative for headaches.  All other systems reviewed and are negative.    Physical Exam Triage Vital Signs ED Triage Vitals  Encounter Vitals Group     BP 08/10/24 1412 125/78     Girls Systolic BP Percentile --      Girls Diastolic BP Percentile --      Boys Systolic BP Percentile --      Boys Diastolic BP Percentile --      Pulse Rate 08/10/24 1412 80     Resp 08/10/24 1412 20     Temp 08/10/24 1412 97.8 F (36.6 C)     Temp Source 08/10/24 1412 Temporal     SpO2 08/10/24 1412 97 %     Weight --      Height --      Head Circumference --      Peak Flow --      Pain Score 08/10/24 1409 0     Pain Loc --      Pain Education --      Exclude from Growth Chart --    No data found.  Updated Vital Signs BP 125/78 (BP Location: Right Arm)   Pulse 80   Temp 97.8 F (36.6 C) (Temporal)   Resp 20   SpO2 97%   Visual Acuity Right Eye Distance:   Left Eye Distance:   Bilateral Distance:    Right Eye Near:   Left Eye Near:    Bilateral Near:     Physical Exam Vitals reviewed.  Constitutional:      General: He is awake. He is not in  acute distress.    Appearance: Normal appearance. He is well-developed. He is not toxic-appearing or diaphoretic.     Comments:  Chronically-ill appearing elderly male sitting comfortably in chair in no acute distress or discomfort   HENT:     Head: Normocephalic.     Right Ear: Tympanic membrane, ear canal and external ear normal. No drainage, swelling or tenderness. No middle ear effusion. Tympanic membrane is not erythematous.     Left Ear: Tympanic membrane, ear canal and external ear normal. No drainage, swelling or tenderness.  No middle ear effusion. Tympanic membrane is not erythematous.     Nose: Nose normal. No congestion or rhinorrhea.     Mouth/Throat:     Lips: Pink.     Mouth: Mucous membranes are moist.     Pharynx: Oropharynx is clear. Uvula midline. No pharyngeal swelling, oropharyngeal exudate, posterior oropharyngeal erythema or uvula swelling.     Tonsils: No tonsillar exudate or tonsillar abscesses.  Eyes:     General: Vision grossly intact.     Conjunctiva/sclera: Conjunctivae normal.  Cardiovascular:     Rate and Rhythm: Normal rate.     Pulses: Normal pulses.     Heart sounds: Normal heart sounds.  Pulmonary:     Effort: Pulmonary effort is normal. No tachypnea or respiratory distress.     Breath sounds: Normal breath sounds and air entry. No decreased air movement. No wheezing.     Comments: Respirations even and unlabored  Musculoskeletal:        General: Normal range of motion.     Cervical back: Normal range of motion and neck supple.     Right lower leg: No edema.     Left lower leg: No edema.  Lymphadenopathy:     Cervical: No cervical adenopathy.  Skin:    General: Skin is warm and dry.  Neurological:     General: No focal deficit present.     Mental Status: He is alert and oriented to person, place, and time.  Psychiatric:        Behavior: Behavior is cooperative.      UC Treatments / Results  Labs (all labs ordered are listed, but only abnormal results are displayed) Labs Reviewed - No data to display  EKG   Radiology No results found.  Procedures Procedures  (including critical care time)  Medications Ordered in UC Medications  dexamethasone  (DECADRON ) injection 10 mg (has no administration in time range)    Initial Impression / Assessment and Plan / UC Course  I have reviewed the triage vital signs and the nursing notes.  Pertinent labs & imaging results that were available during my care of the patient were reviewed by me and considered in my medical decision making (see chart for details).     The patient presents with five days of worsening dry cough, fatigue, shortness of breath, decreased appetite, and weight loss in the setting of a recent diagnosis of neuroendocrine carcinoma of the lung (atypical carcinoid) confirmed by bronchoscopy on 11/18. He reports persistent cough since the procedure, now associated with wheezing and dyspnea, along with subjective fevers. On exam today, he is chronically ill appearing but nontoxic, afebrile, and in no acute distress with oxygen saturation of 97% and even, unlabored respirations. Lung sounds are clear without focal findings. The cough is likely related to airway irritation or bronchospasm following bronchoscopy and/or malignancy rather than acute infection. He was treated with intramuscular Decadron  10 mg in clinic for airway inflammation and  prescribed Tessalon Perles as needed for cough suppression and albuterol MDI as needed for wheezing or shortness of breath.  The patient also reports prolonged poor appetite with associated weight loss. He is currently taking metformin  and Jardiance  for diabetes management; his last A1C was 7.0%, indicating acceptable glycemic control. He has a known history of CKD, for which Jardiance  was likely initiated for renal protection; however, given his ongoing appetite loss in the setting of recent cancer diagnosis and systemic symptoms, the decision was made to hold Jardiance  temporarily to assess for improvement in appetite and nutritional intake. The patient was counseled  to stay well hydrated, consume small frequent meals, and begin nutritional supplementation with Ensure three times daily to support caloric intake. The decision to resume Jardiance  will be reassessed by his primary care provider or oncology team based on his clinical status. The patient and his wife expressed understanding and agreement with this plan.  He was advised to maintain close follow-up with oncology as scheduled and with his primary care provider for metabolic monitoring during medication changes and nutritional evaluation. Emergency evaluation was advised for worsening shortness of breath, chest pain, hypoxia, persistent vomiting, inability to maintain oral intake, severe weakness, fever, hemoptysis, or any sudden change in condition.  Today's evaluation has revealed no signs of a dangerous process. Discussed diagnosis with patient and/or guardian. Patient and/or guardian aware of their diagnosis, possible red flag symptoms to watch out for and need for close follow up. Patient and/or guardian understands verbal and written discharge instructions. Patient and/or guardian comfortable with plan and disposition.  Patient and/or guardian has a clear mental status at this time, good insight into illness (after discussion and teaching) and has clear judgment to make decisions regarding their care  Documentation was completed with the aid of voice recognition software. Transcription may contain typographical errors.  Final Clinical Impressions(s) / UC Diagnoses   Final diagnoses:  Persistent dry cough  Decreased appetite  Generalized weakness  Fatigue, unspecified type  Shortness of breath     Discharge Instructions      You were seen today for ongoing cough, shortness of breath, fatigue, and poor appetite related to your recent lung cancer diagnosis and bronchoscopy. Your oxygen level and lung exam were reassuring today, and there were no signs of an acute infection or emergency. Your  cough and breathing symptoms are likely related to airway irritation or inflammation. You were given a steroid injection in the clinic to help reduce airway swelling. Use your albuterol inhaler as needed for wheezing or shortness of breath, following the directions on the inhaler. You may use Tessalon Perles as prescribed to help calm the cough. Staying well hydrated with water, electrolyte drinks, warm teas, or broths can help thin mucus. Gentle steam or a cool mist humidifier may also ease coughing. Try to rest as much as possible.  Because your appetite has been poor and you have had weight loss, Jardiance  was stopped for now to see if this allows your appetite to improve. Continue taking your metformin  as directed. Focus on small, frequent meals even if you do not feel hungry. Examples include soups, scrambled eggs, yogurt, smoothies, toast, oatmeal, or soft foods. Drink Ensure or similar nutritional supplements three times daily to help maintain calories and protein intake. Continue to drink fluids regularly to avoid dehydration.  Follow up with your oncology team as scheduled to continue your cancer treatment planning, and follow up with your primary care provider to review your diabetes management and nutritional  status after stopping Jardiance . Further decisions about restarting this medication will be made by your PCP or oncology team based on how you are doing. Go to the emergency department right away if you develop worsening or severe shortness of breath, new or crushing chest pain, dizziness or fainting, bluish lips or fingertips, coughing up blood, persistent vomiting, inability to keep fluids down, new fever or chills, severe weakness, confusion, or any rapid or sudden worsening of your symptoms.     ED Prescriptions     Medication Sig Dispense Auth. Provider   benzonatate  (TESSALON ) 100 MG capsule Take 1 capsule (100 mg total) by mouth 3 (three) times daily as needed for cough. 30 capsule  Iola Lukes, FNP   albuterol  (VENTOLIN  HFA) 108 (90 Base) MCG/ACT inhaler Inhale 2 puffs into the lungs every 4 (four) hours as needed for wheezing or shortness of breath. 18 g Iola Lukes, FNP      PDMP not reviewed this encounter.   Iola Lukes, [place] 08/10/24 [phone]

## 2024-08-12 ENCOUNTER — Other Ambulatory Visit (HOSPITAL_COMMUNITY): Payer: Self-pay

## 2024-08-13 NOTE — Progress Notes (Unsigned)
 Established Patient Pulmonology Office Visit   Subjective:  Patient ID: Edward [name], male    DOB: 11-Apr-1941  MRN: [id number]  CC: No chief complaint on file.   HPI  Edward [name] is a 83 y.o. male with DM, HTN, HLD, and GERD who initially presented for evaluation of right lung mass found to have carcinoid tumor. He presents for follow up.  He saw oncology who diagnosed him with stage 3c atypical carcinoid. He is planned to get radiation and chemotherapy.   {PULM QUESTIONNAIRES (Optional):33196}  ROS  {History (Optional):23778}  Current Outpatient Medications:    albuterol (VENTOLIN HFA) 108 (90 Base) MCG/ACT inhaler, Inhale 2 puffs into the lungs every 4 (four) hours as needed for wheezing or shortness of breath., Disp: 18 g, Rfl: 0   APPLE CIDER VINEGAR PO, Take 1-2 tablets by mouth daily., Disp: , Rfl:    aspirin 81 MG tablet, Take 81 mg by mouth 3 (three) times a week., Disp: , Rfl:    atorvastatin  (LIPITOR) 40 MG tablet, Take 1 tablet (40 mg total) by mouth every evening for cholesterol., Disp: 270 tablet, Rfl: 0   atorvastatin  (LIPITOR) 40 MG tablet, Take 1 tablet (40 mg total) by mouth every evening for cholesterol (Patient not taking: Reported on 08/10/2024), Disp: 270 tablet, Rfl: 0   benzonatate (TESSALON) 100 MG capsule, Take 1 capsule (100 mg total) by mouth 3 (three) times daily as needed for cough., Disp: 30 capsule, Rfl: 0   Cholecalciferol (VITAMIN D  PO), Take 5,000 Units by mouth daily., Disp: , Rfl:    CINNAMON PO, Take 1-2 tablets by mouth daily., Disp: , Rfl:    Cyanocobalamin (VITAMIN B 12 PO), Take 1 tablet by mouth daily., Disp: , Rfl:    [Paused] empagliflozin  (JARDIANCE ) 10 MG TABS tablet, Take 1 tablet (10 mg total) by mouth daily., Disp: 90 tablet, Rfl: 0   erythromycin  ophthalmic ointment, Apply ointment to eye sutures twice daily for 14 days, Disp: 3.5 g, Rfl: 2   famotidine  (PEPCID ) 40 MG tablet, Take 1 tablet (40 mg total) by mouth every  evening to prevent heartburn and indigestion., Disp: 90 tablet, Rfl: 0   fluticasone  (FLONASE ) 50 MCG/ACT nasal spray, Place 1-2 sprays into both nostrils daily. (Patient taking differently: Place 1-2 sprays into both nostrils as needed.), Disp: 16 g, Rfl: 1   glucose blood (ONETOUCH VERIO) test strip, USE TO TEST BLOOD SUGAR ONCE DAILY, Disp: 100 strip, Rfl: 12   glucose blood test strip, Use 1 strip to check glucose once daily., Disp: 100 each, Rfl: 12   losartan  (COZAAR ) 50 MG tablet, Take 1 tablet (50 mg total) by mouth daily for blood pressure., Disp: 90 tablet, Rfl: 0   metFORMIN  (GLUCOPHAGE ) 500 MG tablet, Take 1 tablet (500 mg total) by mouth 3 (three) times daily with meals for diabetes., Disp: 270 tablet, Rfl: 0   OMEGA-3 FATTY ACIDS PO, Take 700 mg by mouth 3 (three) times a week., Disp: , Rfl:    pantoprazole  (PROTONIX ) 40 MG tablet, Take 1 tablet (40 mg total) by mouth every evening to prevent heartburn and indigestion., Disp: 90 tablet, Rfl: 0   tamsulosin  (FLOMAX ) 0.4 MG CAPS capsule, Take 1 capsule (0.4 mg total) by mouth every evening for prostate., Disp: 90 capsule, Rfl: 0   tizanidine  (ZANAFLEX ) 2 MG capsule, Take 1 capsule (2 mg total) by mouth 2 (two) times daily as needed for muscle spasms (Patient not taking: Reported on 08/10/2024), Disp: 30 capsule, Rfl:  0   zinc gluconate 50 MG tablet, Take 50 mg by mouth daily., Disp: , Rfl:       Objective:  There were no vitals taken for this visit. {Pulm Vitals (Optional):32837}  Physical Exam   Diagnostic Review:  {Labs (Optional):32838}  CT CHEST/ABD/PLV 07/02/2024: IMPRESSION: 1. Large, lobular mass of the inferior right hilum, with frank occlusion of the right middle lobe bronchi and marked narrowing of the right lower lobar and proximal segmental airways. Findings are consistent with primary lung malignancy. 2. Mass is inseparable from the right hilum; no other evidence of mediastinal lymphadenopathy nor distant  metastatic disease in the chest, abdomen, or pelvis. 3. Small right pleural effusion. 4. Cardiomegaly and coronary artery disease. 5. Nonobstructive right nephrolithiasis.  Multiple bladder calculi.  Brain MRI 07/22/2024: no metastatic disease  PET/CT 07/22/2024: IMPRESSION: 1. 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 2. Focal anorectal hypermetabolism, indeterminate. Malignancy cannot be excluded. 3. Small pericardial effusion. 4. Small right pleural effusion. 5. Postobstructive right middle lobe collapse. 6. Small right renal stone. 7. Bilateral L5 pars defects with severe grade 1 or mild grade 2 anterolisthesis. 8. Aortic atherosclerosis (ICD10-I70.0). Coronary artery calcification.IMPRESSION: 1. 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 2. Focal anorectal hypermetabolism, indeterminate. Malignancy cannot be excluded. 3. Small pericardial effusion. 4. Small right pleural effusion. 5. Postobstructive right middle lobe collapse. 6. Small right renal stone. 7. Bilateral L5 pars defects with severe grade 1 or mild grade 2 anterolisthesis. 8. Aortic atherosclerosis (ICD10-I70.0). Coronary artery calcification.   CYTOLOGY - NON PAP [facility] [address], [address] [place], [place] [address] Telephone [phone] or [phone] Fax [phone]  CYTOPATHOLOGY REPORT   Accession #: [id number] Patient Name: Edward [name] Visit # : [id number]  MRN: [id number] Physician: Isadora Hose DOB/Age September 06, 1941 (Age: 25) Gender: M Collected Date:  07/30/2024 Received Date: 07/30/2024  FINAL DIAGNOSIS STATEMENT Of SPECIMEN ADEQUACY:  INTERPRETATION(S):      - WELL-DIFFERENTIATED NEUROENDOCRINE TUMOR, SEE NOTE      - WELL-DIFFERENTIATED NEUROENDOCRINE TUMOR, SEE NOTE      - LYMPHOID TISSUE IS NOT IDENTIFIED      - RARE CLUSTERS OF WELL-DIFFERENTIATED NEUROENDOCRINE TUMOR, SEE      - NO MALIGNANT CELLS IDENTIFIED   07/18/2024: FINAL MICROSCOPIC DIAGNOSIS:  B. LYMPH NODE, 4L, FINE NEEDLE ASPIRATION:  - No malignant cells identified  - Lymphoid tissue present   A. LUNG, RML MASS, BRUSHING:  - Atypical cells present, see comment   C. LUNG, RML MASS, FINE NEEDLE ASPIRATION:  - Scant atypical cells present, see comment  - Predominantly blood     Assessment & Plan:   Assessment & Plan   No orders of the defined types were placed in this encounter.     No follow-ups on file.   Jaice Lague, MD

## 2024-08-14 ENCOUNTER — Ambulatory Visit (INDEPENDENT_AMBULATORY_CARE_PROVIDER_SITE_OTHER)

## 2024-08-14 ENCOUNTER — Other Ambulatory Visit (HOSPITAL_COMMUNITY): Payer: Self-pay

## 2024-08-14 ENCOUNTER — Other Ambulatory Visit: Payer: Self-pay

## 2024-08-14 ENCOUNTER — Ambulatory Visit (HOSPITAL_BASED_OUTPATIENT_CLINIC_OR_DEPARTMENT_OTHER): Admitting: Pulmonary Disease

## 2024-08-14 VITALS — BP 110/50 | HR 88 | Temp 99.2°F | Ht 69.0 in | Wt 163.3 lb

## 2024-08-14 DIAGNOSIS — C3491 Malignant neoplasm of unspecified part of right bronchus or lung: Secondary | ICD-10-CM

## 2024-08-14 DIAGNOSIS — J189 Pneumonia, unspecified organism: Secondary | ICD-10-CM

## 2024-08-14 DIAGNOSIS — R0609 Other forms of dyspnea: Secondary | ICD-10-CM

## 2024-08-14 DIAGNOSIS — J449 Chronic obstructive pulmonary disease, unspecified: Secondary | ICD-10-CM

## 2024-08-14 DIAGNOSIS — R918 Other nonspecific abnormal finding of lung field: Secondary | ICD-10-CM

## 2024-08-14 DIAGNOSIS — J90 Pleural effusion, not elsewhere classified: Secondary | ICD-10-CM

## 2024-08-14 LAB — POC COVID19 BINAXNOW: SARS Coronavirus 2 Ag: NEGATIVE

## 2024-08-14 LAB — PULMONARY FUNCTION TEST
FEF 25-75 Pre: 1.08 L/s
FEF2575-%Pred-Pre: 60 %
FEV1-%Pred-Pre: 65 %
FEV1-Pre: 1.75 L
FEV1FVC-%Pred-Pre: 90 %
FEV6-%Pred-Pre: 77 %
FEV6-Pre: 2.73 L
FEV6FVC-%Pred-Pre: 107 %
FVC-%Pred-Pre: 72 %
FVC-Pre: 2.73 L
Pre FEV1/FVC ratio: 64 %
Pre FEV6/FVC Ratio: 100 %

## 2024-08-14 LAB — POCT INFLUENZA A/B
Influenza A, POC: NEGATIVE
Influenza B, POC: NEGATIVE

## 2024-08-14 MED ORDER — ALBUTEROL SULFATE 1.25 MG/3ML IN NEBU
1.0000 | INHALATION_SOLUTION | Freq: Four times a day (QID) | RESPIRATORY_TRACT | 12 refills | Status: AC | PRN
  Filled 2024-08-14: qty 75, 7d supply, fill #0
  Filled 2024-09-09: qty 75, 7d supply, fill #1

## 2024-08-14 MED ORDER — LEVOFLOXACIN 750 MG PO TABS
750.0000 mg | ORAL_TABLET | Freq: Every day | ORAL | 0 refills | Status: DC
  Filled 2024-08-14: qty 7, 7d supply, fill #0

## 2024-08-14 MED ORDER — STIOLTO RESPIMAT 2.5-2.5 MCG/ACT IN AERS
2.0000 | INHALATION_SPRAY | Freq: Every day | RESPIRATORY_TRACT | 6 refills | Status: DC
  Filled 2024-08-14: qty 4, 30d supply, fill #0

## 2024-08-14 NOTE — Progress Notes (Incomplete)
 Location of tumor and Histology per Pathology Report: Right middle lobe   Biopsy:    Past/Anticipated interventions by surgeon, if any:   Past/Anticipated interventions by medical oncology, if any: Chemotherapy ***    Pain issues, if any:  {:18581} {PAIN DESCRIPTION:21022940}  SAFETY ISSUES: Prior radiation? {:18581} Pacemaker/ICD? {:18581} Possible current pregnancy? no Is the patient on methotrexate? {:18581}  Current Complaints / other details:  ***     ***

## 2024-08-14 NOTE — Progress Notes (Signed)
 Spirometry only performed today. See PFT note.

## 2024-08-14 NOTE — Patient Instructions (Signed)
  VISIT SUMMARY: You visited us  today due to worsening respiratory symptoms, fever, and chills. We discussed your recent biopsy, increased shortness of breath, and left leg swelling. We have ordered several tests and prescribed medications to address your symptoms and concerns.  YOUR PLAN: COMMUNITY-ACQUIRED PNEUMONIA: You have symptoms that suggest pneumonia, possibly related to your recent bronchoscopy. -We have ordered a chest x-ray to check for pneumonia or a collapsed lung. -You are prescribed levofloxacin 750 mg once daily for 7 days. -We have ordered COVID and flu tests. -We have ordered blood work to check for clots in your lungs and legs. -You are provided with a nebulizer machine and albuterol solution to use 2-3 times daily while you have symptoms.  CHRONIC OBSTRUCTIVE PULMONARY DISEASE (COPD): Your COPD symptoms, including shortness of breath and wheezing, are likely due to your smoking history. -You are prescribed a Stiolto inhaler, 2 puffs once daily. -We have shown you have to use a respimat inhaler in the clinic. If you forget, plug in the name of your inhaler in youtube and it will show you how to use it. -We will reschedule your breathing test when your symptoms improve.  LEFT LOWER EXTREMITY SWELLING, RULE OUT DEEP VEIN THROMBOSIS (DVT): Your left leg swelling may be due to prolonged sitting, but we need to rule out blood clots. -We have ordered blood work to check for clots in your lungs and legs. -If the blood work indicates clots, we will order a CT scan of your chest and an ultrasound of your leg.  Contains text generated by [employer].

## 2024-08-14 NOTE — Patient Instructions (Signed)
 Spirometry only performed today. See PFT note.

## 2024-08-15 ENCOUNTER — Ambulatory Visit
Admission: RE | Admit: 2024-08-15 | Discharge: 2024-08-15 | Attending: Radiation Oncology | Admitting: Radiation Oncology

## 2024-08-15 ENCOUNTER — Emergency Department (HOSPITAL_COMMUNITY)

## 2024-08-15 ENCOUNTER — Ambulatory Visit

## 2024-08-15 ENCOUNTER — Other Ambulatory Visit: Payer: Self-pay

## 2024-08-15 ENCOUNTER — Inpatient Hospital Stay (HOSPITAL_COMMUNITY)
Admission: EM | Admit: 2024-08-15 | Discharge: 2024-08-19 | DRG: 193 | Disposition: A | Attending: Family Medicine | Admitting: Family Medicine

## 2024-08-15 ENCOUNTER — Telehealth: Payer: Self-pay | Admitting: Pulmonary Disease

## 2024-08-15 ENCOUNTER — Encounter (HOSPITAL_COMMUNITY): Payer: Self-pay | Admitting: Internal Medicine

## 2024-08-15 ENCOUNTER — Ambulatory Visit (HOSPITAL_BASED_OUTPATIENT_CLINIC_OR_DEPARTMENT_OTHER): Payer: Self-pay | Admitting: Pulmonary Disease

## 2024-08-15 ENCOUNTER — Ambulatory Visit: Payer: Self-pay | Admitting: Pulmonary Disease

## 2024-08-15 ENCOUNTER — Other Ambulatory Visit (HOSPITAL_COMMUNITY): Payer: Self-pay

## 2024-08-15 DIAGNOSIS — J9601 Acute respiratory failure with hypoxia: Secondary | ICD-10-CM | POA: Insufficient documentation

## 2024-08-15 DIAGNOSIS — Z860101 Personal history of adenomatous and serrated colon polyps: Secondary | ICD-10-CM | POA: Diagnosis not present

## 2024-08-15 DIAGNOSIS — Z5111 Encounter for antineoplastic chemotherapy: Secondary | ICD-10-CM | POA: Diagnosis not present

## 2024-08-15 DIAGNOSIS — Z87891 Personal history of nicotine dependence: Secondary | ICD-10-CM | POA: Diagnosis not present

## 2024-08-15 DIAGNOSIS — Z8249 Family history of ischemic heart disease and other diseases of the circulatory system: Secondary | ICD-10-CM | POA: Diagnosis not present

## 2024-08-15 DIAGNOSIS — C7A090 Malignant carcinoid tumor of the bronchus and lung: Secondary | ICD-10-CM | POA: Diagnosis not present

## 2024-08-15 DIAGNOSIS — I4719 Other supraventricular tachycardia: Secondary | ICD-10-CM | POA: Diagnosis not present

## 2024-08-15 DIAGNOSIS — T451X5A Adverse effect of antineoplastic and immunosuppressive drugs, initial encounter: Secondary | ICD-10-CM | POA: Diagnosis not present

## 2024-08-15 DIAGNOSIS — N138 Other obstructive and reflux uropathy: Secondary | ICD-10-CM | POA: Diagnosis present

## 2024-08-15 DIAGNOSIS — Z7984 Long term (current) use of oral hypoglycemic drugs: Secondary | ICD-10-CM | POA: Diagnosis not present

## 2024-08-15 DIAGNOSIS — K219 Gastro-esophageal reflux disease without esophagitis: Secondary | ICD-10-CM | POA: Diagnosis present

## 2024-08-15 DIAGNOSIS — E119 Type 2 diabetes mellitus without complications: Secondary | ICD-10-CM | POA: Diagnosis not present

## 2024-08-15 DIAGNOSIS — I5022 Chronic systolic (congestive) heart failure: Secondary | ICD-10-CM | POA: Diagnosis present

## 2024-08-15 DIAGNOSIS — E1122 Type 2 diabetes mellitus with diabetic chronic kidney disease: Secondary | ICD-10-CM | POA: Diagnosis not present

## 2024-08-15 DIAGNOSIS — I493 Ventricular premature depolarization: Secondary | ICD-10-CM | POA: Diagnosis present

## 2024-08-15 DIAGNOSIS — Z1152 Encounter for screening for COVID-19: Secondary | ICD-10-CM | POA: Diagnosis not present

## 2024-08-15 DIAGNOSIS — J449 Chronic obstructive pulmonary disease, unspecified: Secondary | ICD-10-CM | POA: Diagnosis not present

## 2024-08-15 DIAGNOSIS — D3A8 Other benign neuroendocrine tumors: Secondary | ICD-10-CM | POA: Diagnosis not present

## 2024-08-15 DIAGNOSIS — R0602 Shortness of breath: Secondary | ICD-10-CM | POA: Diagnosis not present

## 2024-08-15 DIAGNOSIS — I472 Ventricular tachycardia, unspecified: Secondary | ICD-10-CM | POA: Diagnosis present

## 2024-08-15 DIAGNOSIS — C7A8 Other malignant neuroendocrine tumors: Secondary | ICD-10-CM

## 2024-08-15 DIAGNOSIS — N183 Chronic kidney disease, stage 3 unspecified: Secondary | ICD-10-CM | POA: Diagnosis present

## 2024-08-15 DIAGNOSIS — I447 Left bundle-branch block, unspecified: Secondary | ICD-10-CM | POA: Diagnosis present

## 2024-08-15 DIAGNOSIS — J189 Pneumonia, unspecified organism: Secondary | ICD-10-CM | POA: Diagnosis not present

## 2024-08-15 DIAGNOSIS — R0600 Dyspnea, unspecified: Principal | ICD-10-CM

## 2024-08-15 DIAGNOSIS — E7849 Other hyperlipidemia: Secondary | ICD-10-CM | POA: Diagnosis present

## 2024-08-15 DIAGNOSIS — I10 Essential (primary) hypertension: Secondary | ICD-10-CM | POA: Diagnosis present

## 2024-08-15 DIAGNOSIS — Z79899 Other long term (current) drug therapy: Secondary | ICD-10-CM | POA: Diagnosis not present

## 2024-08-15 DIAGNOSIS — J440 Chronic obstructive pulmonary disease with acute lower respiratory infection: Secondary | ICD-10-CM | POA: Diagnosis present

## 2024-08-15 DIAGNOSIS — N401 Enlarged prostate with lower urinary tract symptoms: Secondary | ICD-10-CM | POA: Diagnosis present

## 2024-08-15 DIAGNOSIS — Z7982 Long term (current) use of aspirin: Secondary | ICD-10-CM | POA: Diagnosis not present

## 2024-08-15 DIAGNOSIS — J9611 Chronic respiratory failure with hypoxia: Secondary | ICD-10-CM | POA: Diagnosis not present

## 2024-08-15 DIAGNOSIS — R112 Nausea with vomiting, unspecified: Secondary | ICD-10-CM | POA: Diagnosis not present

## 2024-08-15 DIAGNOSIS — E1169 Type 2 diabetes mellitus with other specified complication: Secondary | ICD-10-CM | POA: Diagnosis present

## 2024-08-15 DIAGNOSIS — C7A1 Malignant poorly differentiated neuroendocrine tumors: Secondary | ICD-10-CM | POA: Diagnosis not present

## 2024-08-15 DIAGNOSIS — J188 Other pneumonia, unspecified organism: Secondary | ICD-10-CM | POA: Diagnosis present

## 2024-08-15 DIAGNOSIS — Z833 Family history of diabetes mellitus: Secondary | ICD-10-CM | POA: Diagnosis not present

## 2024-08-15 DIAGNOSIS — R7989 Other specified abnormal findings of blood chemistry: Secondary | ICD-10-CM

## 2024-08-15 DIAGNOSIS — I110 Hypertensive heart disease with heart failure: Secondary | ICD-10-CM | POA: Diagnosis present

## 2024-08-15 DIAGNOSIS — Z510 Encounter for antineoplastic radiation therapy: Secondary | ICD-10-CM | POA: Diagnosis not present

## 2024-08-15 DIAGNOSIS — Z888 Allergy status to other drugs, medicaments and biological substances status: Secondary | ICD-10-CM | POA: Diagnosis not present

## 2024-08-15 DIAGNOSIS — R0609 Other forms of dyspnea: Secondary | ICD-10-CM | POA: Diagnosis not present

## 2024-08-15 LAB — URINALYSIS, W/ REFLEX TO CULTURE (INFECTION SUSPECTED)
Bacteria, UA: NONE SEEN
Bilirubin Urine: NEGATIVE
Glucose, UA: NEGATIVE mg/dL
Ketones, ur: NEGATIVE mg/dL
Leukocytes,Ua: NEGATIVE
Nitrite: NEGATIVE
Protein, ur: NEGATIVE mg/dL
Specific Gravity, Urine: 1.011 (ref 1.005–1.030)
pH: 5 (ref 5.0–8.0)

## 2024-08-15 LAB — I-STAT CHEM 8, ED
BUN: 19 mg/dL (ref 8–23)
Calcium, Ion: 1.16 mmol/L (ref 1.15–1.40)
Chloride: 102 mmol/L (ref 98–111)
Creatinine, Ser: 1.1 mg/dL (ref 0.61–1.24)
Glucose, Bld: 166 mg/dL — ABNORMAL HIGH (ref 70–99)
HCT: 39 % (ref 39.0–52.0)
Hemoglobin: 13.3 g/dL (ref 13.0–17.0)
Potassium: 3.8 mmol/L (ref 3.5–5.1)
Sodium: 137 mmol/L (ref 135–145)
TCO2: 24 mmol/L (ref 22–32)

## 2024-08-15 LAB — COMPREHENSIVE METABOLIC PANEL WITH GFR
ALT: 10 U/L (ref 0–44)
AST: 10 U/L — ABNORMAL LOW (ref 15–41)
Albumin: 2.9 g/dL — ABNORMAL LOW (ref 3.5–5.0)
Alkaline Phosphatase: 68 U/L (ref 38–126)
Anion gap: 11 (ref 5–15)
BUN: 19 mg/dL (ref 8–23)
CO2: 23 mmol/L (ref 22–32)
Calcium: 8.8 mg/dL — ABNORMAL LOW (ref 8.9–10.3)
Chloride: 102 mmol/L (ref 98–111)
Creatinine, Ser: 1.1 mg/dL (ref 0.61–1.24)
GFR, Estimated: >60 mL/min (ref >60)
Glucose, Bld: 168 mg/dL — ABNORMAL HIGH (ref 70–99)
Potassium: 4 mmol/L (ref 3.5–5.1)
Sodium: 136 mmol/L (ref 135–145)
Total Bilirubin: 0.6 mg/dL (ref 0.0–1.2)
Total Protein: 6 g/dL — ABNORMAL LOW (ref 6.5–8.1)

## 2024-08-15 LAB — CBC WITH DIFFERENTIAL/PLATELET
Abs Immature Granulocytes: 0.06 K/uL (ref 0.00–0.07)
Basophils Absolute: 0 K/uL (ref 0.0–0.1)
Basophils Relative: 0 %
Eosinophils Absolute: 0 K/uL (ref 0.0–0.5)
Eosinophils Relative: 0 %
HCT: 39.5 % (ref 39.0–52.0)
Hemoglobin: 13.1 g/dL (ref 13.0–17.0)
Immature Granulocytes: 1 %
Lymphocytes Relative: 5 %
Lymphs Abs: 0.6 K/uL — ABNORMAL LOW (ref 0.7–4.0)
MCH: 29 pg (ref 26.0–34.0)
MCHC: 33.2 g/dL (ref 30.0–36.0)
MCV: 87.6 fL (ref 80.0–100.0)
Monocytes Absolute: 0.7 K/uL (ref 0.1–1.0)
Monocytes Relative: 6 %
Neutro Abs: 10.8 K/uL — ABNORMAL HIGH (ref 1.7–7.7)
Neutrophils Relative %: 88 %
Platelets: 218 K/uL (ref 150–400)
RBC: 4.51 MIL/uL (ref 4.22–5.81)
RDW: 13.7 % (ref 11.5–15.5)
WBC: 12.2 K/uL — ABNORMAL HIGH (ref 4.0–10.5)
nRBC: 0 % (ref 0.0–0.2)

## 2024-08-15 LAB — RESP PANEL BY RT-PCR (RSV, FLU A&B, COVID)  RVPGX2
Influenza A by PCR: NEGATIVE
Influenza B by PCR: NEGATIVE
Resp Syncytial Virus by PCR: NEGATIVE
SARS Coronavirus 2 by RT PCR: NEGATIVE

## 2024-08-15 LAB — TROPONIN T, HIGH SENSITIVITY
Troponin T High Sensitivity: 26 ng/L — ABNORMAL HIGH (ref 0–19)
Troponin T High Sensitivity: 21 ng/L — ABNORMAL HIGH (ref 0–19)

## 2024-08-15 LAB — GLUCOSE, CAPILLARY
Glucose-Capillary: 126 mg/dL — ABNORMAL HIGH (ref 70–99)
Glucose-Capillary: 136 mg/dL — ABNORMAL HIGH (ref 70–99)

## 2024-08-15 LAB — PRO BRAIN NATRIURETIC PEPTIDE: Pro Brain Natriuretic Peptide: 1200 pg/mL — ABNORMAL HIGH (ref <300.0)

## 2024-08-15 LAB — D-DIMER, QUANTITATIVE: D-DIMER: 1.22 mg{FEU}/L — ABNORMAL HIGH (ref 0.00–0.49)

## 2024-08-15 LAB — I-STAT CG4 LACTIC ACID, ED: Lactic Acid, Venous: 1.1 mmol/L (ref 0.5–1.9)

## 2024-08-15 LAB — MRSA NEXT GEN BY PCR, NASAL: MRSA by PCR Next Gen: NOT DETECTED

## 2024-08-15 MED ORDER — BUDESONIDE 0.25 MG/2ML IN SUSP
0.2500 mg | Freq: Two times a day (BID) | RESPIRATORY_TRACT | Status: DC
  Administered 2024-08-15 – 2024-08-19 (×8): 0.25 mg via RESPIRATORY_TRACT
  Filled 2024-08-15 (×8): qty 2

## 2024-08-15 MED ORDER — IOHEXOL 350 MG/ML SOLN
75.0000 mL | Freq: Once | INTRAVENOUS | Status: AC | PRN
  Administered 2024-08-15: 75 mL via INTRAVENOUS

## 2024-08-15 MED ORDER — ASPIRIN 81 MG PO TBEC
81.0000 mg | DELAYED_RELEASE_TABLET | ORAL | Status: DC
  Administered 2024-08-16 – 2024-08-19 (×2): 81 mg via ORAL
  Filled 2024-08-15 (×2): qty 1

## 2024-08-15 MED ORDER — SODIUM CHLORIDE 0.9 % IV SOLN
2.0000 g | Freq: Once | INTRAVENOUS | Status: AC
  Administered 2024-08-15: 2 g via INTRAVENOUS
  Filled 2024-08-15: qty 12.5

## 2024-08-15 MED ORDER — VANCOMYCIN HCL 1250 MG/250ML IV SOLN
1250.0000 mg | INTRAVENOUS | Status: DC
  Filled 2024-08-15: qty 250

## 2024-08-15 MED ORDER — ALBUTEROL SULFATE (2.5 MG/3ML) 0.083% IN NEBU: 2.5000 mg | INHALATION_SOLUTION | RESPIRATORY_TRACT | Status: DC | PRN

## 2024-08-15 MED ORDER — ARFORMOTEROL TARTRATE 15 MCG/2ML IN NEBU
15.0000 ug | INHALATION_SOLUTION | Freq: Two times a day (BID) | RESPIRATORY_TRACT | Status: DC
  Administered 2024-08-15 – 2024-08-19 (×8): 15 ug via RESPIRATORY_TRACT
  Filled 2024-08-15 (×8): qty 2

## 2024-08-15 MED ORDER — ENSURE PLUS HIGH PROTEIN PO LIQD
237.0000 mL | Freq: Two times a day (BID) | ORAL | Status: DC
  Administered 2024-08-16 – 2024-08-18 (×3): 237 mL via ORAL

## 2024-08-15 MED ORDER — ATORVASTATIN CALCIUM 40 MG PO TABS
40.0000 mg | ORAL_TABLET | Freq: Every evening | ORAL | Status: DC
  Administered 2024-08-15 – 2024-08-18 (×4): 40 mg via ORAL
  Filled 2024-08-15 (×4): qty 1

## 2024-08-15 MED ORDER — STIOLTO RESPIMAT 2.5-2.5 MCG/ACT IN AERS: 2.0000 | INHALATION_SPRAY | Freq: Every day | RESPIRATORY_TRACT | 6 refills | Status: DC

## 2024-08-15 MED ORDER — ONDANSETRON HCL 4 MG/2ML IJ SOLN
4.0000 mg | Freq: Four times a day (QID) | INTRAMUSCULAR | Status: DC | PRN
  Filled 2024-08-15: qty 2

## 2024-08-15 MED ORDER — TAMSULOSIN HCL 0.4 MG PO CAPS
0.4000 mg | ORAL_CAPSULE | Freq: Every evening | ORAL | Status: DC
  Administered 2024-08-15 – 2024-08-18 (×4): 0.4 mg via ORAL
  Filled 2024-08-15 (×4): qty 1

## 2024-08-15 MED ORDER — POLYETHYLENE GLYCOL 3350 17 G PO PACK: 17.0000 g | PACK | Freq: Every day | ORAL | Status: DC | PRN

## 2024-08-15 MED ORDER — ACETAMINOPHEN 325 MG PO TABS
650.0000 mg | ORAL_TABLET | Freq: Four times a day (QID) | ORAL | Status: DC | PRN
  Administered 2024-08-17: 650 mg via ORAL
  Filled 2024-08-15: qty 2

## 2024-08-15 MED ORDER — INSULIN ASPART 100 UNIT/ML IJ SOLN
0.0000 [IU] | Freq: Three times a day (TID) | INTRAMUSCULAR | Status: DC
  Administered 2024-08-15 – 2024-08-16 (×3): 2 [IU] via SUBCUTANEOUS
  Administered 2024-08-16: 3 [IU] via SUBCUTANEOUS
  Administered 2024-08-17: 1 [IU] via SUBCUTANEOUS
  Administered 2024-08-17 – 2024-08-18 (×4): 3 [IU] via SUBCUTANEOUS
  Administered 2024-08-18: 2 [IU] via SUBCUTANEOUS
  Administered 2024-08-19: 3 [IU] via SUBCUTANEOUS
  Administered 2024-08-19: 1 [IU] via SUBCUTANEOUS
  Filled 2024-08-15 (×2): qty 3
  Filled 2024-08-15 (×2): qty 2
  Filled 2024-08-15: qty 1
  Filled 2024-08-15: qty 2
  Filled 2024-08-15 (×2): qty 3
  Filled 2024-08-15: qty 2
  Filled 2024-08-15: qty 3
  Filled 2024-08-15 (×2): qty 2

## 2024-08-15 MED ORDER — SODIUM CHLORIDE 0.9 % IV SOLN
2.0000 g | Freq: Two times a day (BID) | INTRAVENOUS | Status: DC
  Administered 2024-08-16 – 2024-08-18 (×5): 2 g via INTRAVENOUS
  Filled 2024-08-15 (×4): qty 12.5

## 2024-08-15 MED ORDER — VANCOMYCIN HCL 1500 MG/300ML IV SOLN
1500.0000 mg | Freq: Once | INTRAVENOUS | Status: AC
  Administered 2024-08-15: 1500 mg via INTRAVENOUS
  Filled 2024-08-15: qty 300

## 2024-08-15 MED ORDER — ENOXAPARIN SODIUM 40 MG/0.4ML IJ SOSY
40.0000 mg | PREFILLED_SYRINGE | Freq: Every day | INTRAMUSCULAR | Status: DC
  Administered 2024-08-15 – 2024-08-19 (×5): 40 mg via SUBCUTANEOUS
  Filled 2024-08-15 (×5): qty 0.4

## 2024-08-15 MED ORDER — ACETAMINOPHEN 650 MG RE SUPP: 650.0000 mg | Freq: Four times a day (QID) | RECTAL | Status: DC | PRN

## 2024-08-15 MED ORDER — INSULIN ASPART 100 UNIT/ML IJ SOLN
0.0000 [IU] | Freq: Every day | INTRAMUSCULAR | Status: DC
  Filled 2024-08-15: qty 2

## 2024-08-15 MED ORDER — PANTOPRAZOLE SODIUM 40 MG PO TBEC
40.0000 mg | DELAYED_RELEASE_TABLET | Freq: Every day | ORAL | Status: DC
  Administered 2024-08-15 – 2024-08-19 (×5): 40 mg via ORAL
  Filled 2024-08-15 (×5): qty 1

## 2024-08-15 MED ORDER — ONDANSETRON HCL 4 MG PO TABS
4.0000 mg | ORAL_TABLET | Freq: Four times a day (QID) | ORAL | Status: DC | PRN
  Administered 2024-08-17: 4 mg via ORAL
  Filled 2024-08-15: qty 1

## 2024-08-15 MED ORDER — LOSARTAN POTASSIUM 50 MG PO TABS
50.0000 mg | ORAL_TABLET | Freq: Every day | ORAL | Status: DC
  Administered 2024-08-15 – 2024-08-19 (×5): 50 mg via ORAL
  Filled 2024-08-15 (×5): qty 1

## 2024-08-15 MED ORDER — MELATONIN 5 MG PO TABS
5.0000 mg | ORAL_TABLET | Freq: Every evening | ORAL | Status: DC | PRN
  Filled 2024-08-15: qty 1

## 2024-08-15 MED ORDER — REVEFENACIN 175 MCG/3ML IN SOLN
175.0000 ug | Freq: Every day | RESPIRATORY_TRACT | Status: DC
  Administered 2024-08-16 – 2024-08-19 (×4): 175 ug via RESPIRATORY_TRACT
  Filled 2024-08-15 (×4): qty 3

## 2024-08-15 NOTE — Telephone Encounter (Signed)
 Called patient about result of CXR. He has collapse of RLL likely due to known carcinoid tumor and I suspect he has a post obstructive pneumonia. I treated him with levofloxacin already. I sent a message to both Dr. Shannon and Dr. Sherrod from radiation and medical oncology to expedite his treatment since he is progressing quite quickly. He will see Dr. Shannon today. I also gave [name] wife instructions to follow regarding signs and symptoms that require ED visit including but not limited to severe shortness of breath, chest pain, worsening fevers, and hypoxia. I told her to purchase a pulse ox from the pharmacy to evaluate his SPO2 regularly and if she sees his oxygen is less than 90% to take him to ED and call EMS especially that there's snow this weekend.

## 2024-08-15 NOTE — Addendum Note (Signed)
 Addended by: Elanna Bert on: 08/15/2024 08:34 AM   Modules accepted: Orders

## 2024-08-15 NOTE — Plan of Care (Signed)
   Problem: Education: Goal: Ability to describe self-care measures that may prevent or decrease complications (Diabetes Survival Skills Education) will improve Outcome: Progressing Goal: Individualized Educational Video(s) Outcome: Progressing   Problem: Coping: Goal: Ability to adjust to condition or change in health will improve Outcome: Progressing

## 2024-08-15 NOTE — ED Triage Notes (Signed)
 Pt bib EMS due to SOB worsening overnight into today - dx yesterday with collapsed right lung due to mass   A/o x4 -SpO2 95% 4L [place] RR 26 HR 60 - per ems  irregular showing multifocal PVCs -hx lung cancer, prolapse on throat  1L LR

## 2024-08-15 NOTE — ED Notes (Addendum)
 error

## 2024-08-15 NOTE — H&P (Signed)
 History and Physical    Edward [name] FMW:[id number] DOB: 1941-02-17 DOA: 08/15/2024  DOS: the patient was seen and examined on 08/15/2024  PCP: [name], NP-C   Patient coming from: Home  I have personally briefly reviewed patient's old medical records in [facility] and CareEverywhere  HPI:   Edward [name] is a 83 y.o. year old male with past medical history of non-small cell lung cancer of the right lung stage IIIC, BPH, CKD stage III, type II diabetes mellitus, GERD, hyperlipidemia, hypertension, irritable bowel syndrome.  He presents to [facility] ED with a 1 week history of fever, chills, cough, dyspnea, and poor appetite.   He sought care at urgent care and was given a steroid injection and a cough medicine that did not improve his symptoms.  He was seen yesterday in the pulmonary clinic and treated with Levaquin for possible suspected pneumonia.  ED Course: On arrival to [facility] ED patient was noted to be afebrile temp 36.7 Celsius, BP 146/97, HR 91, RR 23, SpO2 92% on 2 L via nasal cannula.  CT angio PE obtained and negative for PE with enlarging right infrahilar mass with complete reduction of the right middle lobe and right lower lobe airspace consolidation concerning for post obstructive pneumonia as well as reflux of contrast into the hepatic veins chest Cardiac dysfunction. Labs notable for leukocytosis WBC 12.2, troponin 26, total protein 6.0, albumin 2.9, glucose elevated 168, and proBNP 1200.  He was given vancomycin and cefepime. TRH contacted for admission.  Review of Systems: As mentioned in the history of present illness. All other systems reviewed and are negative.  Review of Systems  Constitutional:  Positive for chills, fever and malaise/fatigue.  HENT:  Positive for congestion.   Respiratory:  Positive for cough, shortness of breath and wheezing. Negative for sputum production.   Cardiovascular:  Negative for chest pain, palpitations and  leg swelling.  Musculoskeletal:  Negative for falls and myalgias.  Neurological:  Positive for weakness. Negative for focal weakness.  All other systems reviewed and are negative.   Past Medical History:  Diagnosis Date   BPH (benign prostatic hyperplasia)    Cataract    surgical removal bilateral   CKD (chronic kidney disease) stage 3, GFR 30-59 ml/min (HCC)    DM (diabetes mellitus), type 2 (HCC)    ED (erectile dysfunction)    GERD (gastroesophageal reflux disease)    History of adenomatous polyp of colon 11/02/2017   Tubular adenoma 2013; recommended 5 year follow up   History of kidney stones    Hyperlipidemia    Hypertension    IBS (irritable bowel syndrome)    Kidney stones 02/11/2020   LBBB (left bundle branch block)    Lung nodule    Personal history of kidney stones    Vitamin D  deficiency     Past Surgical History:  Procedure Laterality Date   CATARACT EXTRACTION Right 2015   CATARACT EXTRACTION W/ INTRAOCULAR LENS IMPLANT  2013   left   COLONOSCOPY  2013   TA   ENDOBRONCHIAL ULTRASOUND Bilateral 07/30/2024   Procedure: ENDOBRONCHIAL ULTRASOUND (EBUS);  Surgeon: Isadora Hose, MD;  Location: [facility] ORS;  Service: Pulmonary;  Laterality: Bilateral;   EXTRACORPOREAL SHOCK WAVE LITHOTRIPSY Left 12/11/2017   Procedure: LEFT EXTRACORPOREAL SHOCK WAVE LITHOTRIPSY (ESWL);  Surgeon: Carolee Sherwood JONETTA DOUGLAS, MD;  Location: [facility] ORS;  Service: Urology;  Laterality: Left;   KIDNEY STONE SURGERY     POLYPECTOMY  VIDEO BRONCHOSCOPY WITH ENDOBRONCHIAL ULTRASOUND Bilateral 07/18/2024   Procedure: BRONCHOSCOPY, WITH EBUS;  Surgeon: Catherine Cools, MD;  Location: [facility] ENDOSCOPY;  Service: Pulmonary;  Laterality: Bilateral;     reports that he quit smoking about 56 years ago. His smoking use included cigarettes. He started smoking about 66 years ago. He has a 5 pack-year smoking history. He has been exposed to tobacco smoke. He has never used smokeless tobacco. He reports that he does  not drink alcohol and does not use drugs.  Allergies  Allergen Reactions   Bystolic [Nebivolol Hcl]     Family History  Problem Relation Age of Onset   Diabetes Sister    Hypertension Sister    Heart disease Mother    Diabetes Sister    Hypertension Sister    Colon cancer Neg Hx    Colon polyps Neg Hx    Esophageal cancer Neg Hx    Rectal cancer Neg Hx    Stomach cancer Neg Hx     Prior to Admission medications   Medication Sig Start Date End Date Taking? Authorizing Provider  albuterol (ACCUNEB) 1.25 MG/3ML nebulizer solution Inhale 3 mL (1.25 mg total) by nebulization every 6 (six) hours as needed for wheezing. 08/14/24   Alghanim, Fahid, MD  albuterol (VENTOLIN HFA) 108 (90 Base) MCG/ACT inhaler Inhale 2 puffs into the lungs every 4 (four) hours as needed for wheezing or shortness of breath. 08/10/24   Iola Lukes, FNP  APPLE CIDER VINEGAR PO Take 1-2 tablets by mouth daily.    [provider]  aspirin 81 MG tablet Take 81 mg by mouth 3 (three) times a week.    [provider]  atorvastatin  (LIPITOR) 40 MG tablet Take 1 tablet (40 mg total) by mouth every evening for cholesterol. 06/27/24   Alvia Corean CROME, FNP  atorvastatin  (LIPITOR) 40 MG tablet Take 1 tablet (40 mg total) by mouth every evening for cholesterol 05/30/24     benzonatate (TESSALON) 100 MG capsule Take 1 capsule (100 mg total) by mouth 3 (three) times daily as needed for cough. 08/10/24   Murrill, Samantha, FNP  Cholecalciferol (VITAMIN D  PO) Take 5,000 Units by mouth daily.    [provider]  CINNAMON PO Take 1-2 tablets by mouth daily.    [provider]  Cyanocobalamin (VITAMIN B 12 PO) Take 1 tablet by mouth daily.    [provider]  empagliflozin  (JARDIANCE ) 10 MG TABS tablet Take 1 tablet (10 mg total) by mouth daily. 08/07/24   [name], NP-C  erythromycin  ophthalmic ointment Apply ointment to eye sutures twice daily for 14 days 04/03/24      famotidine  (PEPCID ) 40 MG tablet Take 1 tablet (40 mg total) by mouth every evening to prevent heartburn and indigestion. 06/27/24   Alvia Corean CROME, FNP  fluticasone  (FLONASE ) 50 MCG/ACT nasal spray Place 1-2 sprays into both nostrils daily. 06/27/24   Alvia Corean CROME, FNP  glucose blood (ONETOUCH VERIO) test strip USE TO TEST BLOOD SUGAR ONCE DAILY 06/27/24   Alvia Corean L, FNP  glucose blood test strip Use 1 strip to check glucose once daily. 06/27/24   Alvia Corean CROME, FNP  levofloxacin (LEVAQUIN) 750 MG tablet Take 1 tablet (750 mg total) by mouth daily. 08/14/24   Alghanim, Cools, MD  losartan  (COZAAR ) 50 MG tablet Take 1 tablet (50 mg total) by mouth daily for blood pressure. 08/07/24   Henson, Vickie L, NP-C  metFORMIN  (GLUCOPHAGE ) 500 MG tablet Take 1 tablet (  500 mg total) by mouth 3 (three) times daily with meals for diabetes. 06/27/24   Alvia Corean CROME, FNP  OMEGA-3 FATTY ACIDS PO Take 700 mg by mouth 3 (three) times a week.    [provider]  pantoprazole  (PROTONIX ) 40 MG tablet Take 1 tablet (40 mg total) by mouth every evening to prevent heartburn and indigestion. 07/16/24   Henson, Vickie L, NP-C  tamsulosin  (FLOMAX ) 0.4 MG CAPS capsule Take 1 capsule (0.4 mg total) by mouth every evening for prostate. 06/27/24   Alvia Corean CROME, FNP  Tiotropium Bromide-Olodaterol (STIOLTO RESPIMAT) 2.5-2.5 MCG/ACT AERS Inhale 2 puffs into the lungs daily. 08/15/24   Alghanim, Paula, MD  tizanidine  (ZANAFLEX ) 2 MG capsule Take 1 capsule (2 mg total) by mouth 2 (two) times daily as needed for muscle spasms Patient not taking: Reported on 08/14/2024 06/27/24   Alvia Corean CROME, FNP  zinc gluconate 50 MG tablet Take 50 mg by mouth daily.    [provider]    Physical Exam: Vitals:   08/15/24 1202  BP: (!) 156/97  Pulse: 91  Resp: (!) 23  Temp: 98 F (36.7 C)  TempSrc: Oral  SpO2: 92%    Physical Exam Vitals and nursing note  reviewed.  HENT:     Head: Normocephalic.  Eyes:     Pupils: Pupils are equal, round, and reactive to light.  Cardiovascular:     Rate and Rhythm: Normal rate and regular rhythm.     Pulses: Normal pulses.     Heart sounds: Normal heart sounds. No murmur heard.    No friction rub. No gallop.  Pulmonary:     Breath sounds: Decreased air movement present. Examination of the right-lower field reveals decreased breath sounds. Decreased breath sounds present.  Abdominal:     General: Abdomen is flat. Bowel sounds are normal.     Palpations: Abdomen is soft.  Musculoskeletal:     Right lower leg: No edema.     Left lower leg: No edema.  Skin:    General: Skin is warm and dry.     Capillary Refill: Capillary refill takes less than 2 seconds.  Neurological:     General: No focal deficit present.     Mental Status: He is alert and oriented to person, place, and time.      Labs on Admission: I have personally reviewed following labs and imaging studies  CBC: Recent Labs  Lab 08/15/24 1242 08/15/24 1256  WBC 12.2*  --   NEUTROABS 10.8*  --   HGB 13.1 13.3  HCT 39.5 39.0  MCV 87.6  --   PLT 218  --    Basic Metabolic Panel: Recent Labs  Lab 08/15/24 1242 08/15/24 1256  NA 136 137  K 4.0 3.8  CL 102 102  CO2 23  --   GLUCOSE 168* 166*  BUN 19 19  CREATININE 1.10 1.10  CALCIUM  8.8*  --    GFR: Estimated Creatinine Clearance: 51.8 mL/min (by C-G formula based on SCr of 1.1 mg/dL). Liver Function Tests: Recent Labs  Lab 08/15/24 1242  AST 10*  ALT 10  ALKPHOS 68  BILITOT 0.6  PROT 6.0*  ALBUMIN 2.9*   No results for input(s): LIPASE, AMYLASE in the last 168 hours. No results for input(s): AMMONIA in the last 168 hours. Coagulation Profile: No results for input(s): INR, PROTIME in the last 168 hours. Cardiac Enzymes: No results for input(s): CKTOTAL, CKMB, CKMBINDEX, TROPONINI, TROPONINIHS in the last 168 hours.  BNP (last 3 results) No  results for input(s): BNP in the last 8760 hours. HbA1C: No results for input(s): HGBA1C in the last 72 hours. CBG: No results for input(s): GLUCAP in the last 168 hours. Lipid Profile: No results for input(s): CHOL, HDL, LDLCALC, TRIG, CHOLHDL, LDLDIRECT in the last 72 hours. Thyroid  Function Tests: No results for input(s): TSH, T4TOTAL, FREET4, T3FREE, THYROIDAB in the last 72 hours. Anemia Panel: No results for input(s): VITAMINB12, FOLATE, FERRITIN, TIBC, IRON, RETICCTPCT in the last 72 hours. Urine analysis:    Component Value Date/Time   COLORURINE YELLOW 08/15/2024 1325   APPEARANCEUR CLEAR 08/15/2024 1325   LABSPEC 1.011 08/15/2024 1325   PHURINE 5.0 08/15/2024 1325   GLUCOSEU NEGATIVE 08/15/2024 1325   HGBUR SMALL (A) 08/15/2024 1325   BILIRUBINUR NEGATIVE 08/15/2024 1325   KETONESUR NEGATIVE 08/15/2024 1325   PROTEINUR NEGATIVE 08/15/2024 1325   NITRITE NEGATIVE 08/15/2024 1325   LEUKOCYTESUR NEGATIVE 08/15/2024 1325    Radiological Exams on Admission: I have personally reviewed images DG Chest 2 View Result Date: 08/15/2024 CLINICAL DATA:  post bronch. rule out pneumothorax EXAM: CHEST - 2 VIEW COMPARISON:  July 30, 2024 FINDINGS: Increasing airspace consolidation in the right lower lobe. Small pleural effusion on the right. No pneumothorax. The right infrahilar lung mass is less conspicuous on today's radiograph due to the airspace disease in the right lung base. Mild cardiomegaly. Aortic atherosclerosis. No acute fracture or destructive lesions. Multilevel thoracic osteophytosis. IMPRESSION: Increasing airspace consolidation in the right lower lobe, possibly worsening atelectasis or airspace disease given the patient's right infrahilar lung mass. Small right pleural effusion. Electronically Signed   By: Rogelia Myers M.D.   On: 08/15/2024 15:29   CT Angio Chest PE W and/or Wo Contrast Result Date: 08/15/2024 CLINICAL DATA:   Pulmonary embolism (PE) suspected, low to intermediate prob, positive D-dimer EXAM: CT ANGIOGRAPHY CHEST WITH CONTRAST TECHNIQUE: Multidetector CT imaging of the chest was performed using the standard protocol during bolus administration of intravenous contrast. Multiplanar CT image reconstructions and MIPs were obtained to evaluate the vascular anatomy. RADIATION DOSE REDUCTION: This exam was performed according to the departmental dose-optimization program which includes automated exposure control, adjustment of the mA and/or kV according to patient size and/or use of iterative reconstruction technique. CONTRAST:  75mL OMNIPAQUE IOHEXOL 350 MG/ML SOLN COMPARISON:  08/14/2024, 07/22/2024, 07/02/2024 FINDINGS: Pulmonary Embolism: No pulmonary embolism. Cardiovascular: Mild cardiomegaly. Moderate volume pericardial effusion.No aortic aneurysm. Mediastinum/Nodes: No mediastinal mass.Subcarinal lymph node measuring 1.4 cm (axial 76). Unchanged enlarged lower left paratracheal lymph node. Lungs/Pleura: The midline trachea and bronchi are patent. Redemonstration of a right infrahilar mass, which measures 6 x 7.7 x 6.4 cm (previously, 5.7 x 6.3 x 5.6 cm). The mass is again noted to completely obstruct the right middle lobe bronchus with severe to near complete narrowing of the right lower lobe bronchi. The mass also significantly narrows the right interlobar pulmonary artery in the right middle lobe pulmonary artery with complete occlusion of the superior right pulmonary vein. Interval development of dense airspace consolidation filling the majority of the right lower lobe with unchanged, but significant atelectasis of the right middle lobe. No pneumothorax. Small right pleural effusion. Musculoskeletal: No acute fracture. Similar lucent lesion in the right scapula measuring 1.3 cm, which was not FDG avid on the prior PET-CT. Multilevel degenerative disc disease of the spine. Thoracic DISH. Upper Abdomen: No acute  abnormality in the partially visualized upper abdomen. Reflux of contrast into the hepatic veins. Review of the  MIP images confirms the above findings. IMPRESSION: 1. No pulmonary embolism. 2. Enlarging right infrahilar mass, as measured above. This causes complete obstruction of the right middle lobe bronchus with severe to near-complete narrowing of the right lower lobe bronchi. New dense airspace consolidation filling the majority of the right lower lobe. While this may represent right lower lobe atelectasis, it would be worrisome for postobstructive pneumonia, in the correct clinical context. Small right pleural effusion. 3. Reflux of contrast into the hepatic veins is suggestive of underlying cardiac dysfunction. 4. Redemonstrated multistation mediastinal lymph nodes, with a more prominent subcarinal lymph node, concerning for worsening metastatic disease. Aortic Atherosclerosis (ICD10-I70.0). Electronically Signed   By: Rogelia Myers M.D.   On: 08/15/2024 15:26   VAS US  LOWER EXTREMITY VENOUS (DVT) (ONLY [facility]) Result Date: 08/15/2024  Lower Venous DVT Study Patient Name:  Gerad R Clyne  Date of Exam:   08/15/2024 Medical Rec #: [id number]          Accession #:    [id number] Date of Birth: 01-14-1941          Patient Gender: M Patient Age:   55 years Exam Location:  [facility] Procedure:      VAS US  LOWER EXTREMITY VENOUS (DVT) Referring Phys: MAUDE MESSICK --------------------------------------------------------------------------------  Indications: SOB. Other Indications: History of lung cancer. Risk Factors: COPD. Comparison Study: No priors. Performing Technologist: Ricka Sturdivant-Jones RDMS, RVT  Examination Guidelines: A complete evaluation includes B-mode imaging, spectral Doppler, color Doppler, and power Doppler as needed of all accessible portions of each vessel. Bilateral testing is considered an integral part of a complete examination. Limited examinations for reoccurring  indications may be performed as noted. The reflux portion of the exam is performed with the patient in reverse Trendelenburg.  +---------+---------------+---------+-----------+----------+--------------+ RIGHT    CompressibilityPhasicitySpontaneityPropertiesThrombus Aging +---------+---------------+---------+-----------+----------+--------------+ CFV      Full           Yes      Yes                                 +---------+---------------+---------+-----------+----------+--------------+ SFJ      Full                                                        +---------+---------------+---------+-----------+----------+--------------+ FV Prox  Full                                                        +---------+---------------+---------+-----------+----------+--------------+ FV Mid   Full           Yes      Yes                                 +---------+---------------+---------+-----------+----------+--------------+ FV DistalFull                                                        +---------+---------------+---------+-----------+----------+--------------+  PFV      Full                                                        +---------+---------------+---------+-----------+----------+--------------+ POP      Full           Yes      Yes                                 +---------+---------------+---------+-----------+----------+--------------+ PTV      Full                                                        +---------+---------------+---------+-----------+----------+--------------+ PERO     Full                                                        +---------+---------------+---------+-----------+----------+--------------+   +---------+---------------+---------+-----------+----------+--------------+ LEFT     CompressibilityPhasicitySpontaneityPropertiesThrombus Aging  +---------+---------------+---------+-----------+----------+--------------+ CFV      Full           Yes      Yes                                 +---------+---------------+---------+-----------+----------+--------------+ SFJ      Full                                                        +---------+---------------+---------+-----------+----------+--------------+ FV Prox  Full                                                        +---------+---------------+---------+-----------+----------+--------------+ FV Mid   Full           No       Yes                                 +---------+---------------+---------+-----------+----------+--------------+ FV DistalFull                                                        +---------+---------------+---------+-----------+----------+--------------+ PFV      Full                                                        +---------+---------------+---------+-----------+----------+--------------+  POP      Full           Yes      Yes                                 +---------+---------------+---------+-----------+----------+--------------+ PTV      Full                                                        +---------+---------------+---------+-----------+----------+--------------+ PERO     Full                                                        +---------+---------------+---------+-----------+----------+--------------+     Summary: BILATERAL: - No evidence of deep vein thrombosis seen in the lower extremities, bilaterally. -No evidence of popliteal cyst, bilaterally.   *See table(s) above for measurements and observations. Electronically signed by Debby Robertson on 08/15/2024 at 2:26:10 PM.    Final      Assessment/Plan Principal Problem:   Multifocal pneumonia Active Problems:   Hyperlipidemia associated with type 2 diabetes mellitus (HCC)   Gastroesophageal reflux disease   Essential hypertension   BPH  with obstruction/lower urinary tract symptoms   CKD stage 3 due to type 2 diabetes mellitus (HCC)   Primary malignant neuroendocrine neoplasm of lung (HCC)   Acute hypoxic respiratory failure (HCC)   ##Multilobar postobstructive pneumonia ##Acute hypoxic respiratory failure - Continue vancomycin and cefepime - Pulmonary consulted by EDP, appreciate their recommendations and management - O2 supplementation as needed, currently on 4 L via nasal cannula, wean as able  #Chronic obstructive pulmonary disease - Continue as needed and scheduled bronchodilators and nebulizers per pulmonology  #Non-small cell lung cancer of the right lung stage IIIC  -Followed outpatient by Dr. Sherrod. Dr Sherrod notified of admission via secure chat and added to treatment team  #Hypertension - Continue home losartan   #BPH  - Continue home Flomax   #CKD stage III Stable -Avoid nephrotoxins, contrast Dyes, Hypotension and Dehydration -Continue to Monitor and Trend Renal Function carefully and repeat panel in the AM    #Type II diabetes mellitus -AC/at bedtime SSI and CBG monitoring while inpatient  #GERD  -Continue home Protonix   #Hyperlipidemia - Continue home atorvastatin    VTE prophylaxis:  Lovenox  GI prophylaxis: Protonix  Diet: Heart healthy/carb modified Access: PIV Lines: None Code Status:  Full Code Telemetry: Yes  Admission status: Inpatient, Telemetry bed Patient is from: Home Anticipated d/c is to: Home Anticipated d/c date is: 3 days Patient currently: Receiving IV antibiotics, new O2 requirement, pending symptomatic improvement   Family Communication: Wife and Son updated at bedside in ED  Consults called: Dr Neda with pulmonology and Dr. Gatha with oncology   Severity of Illness: The appropriate patient status for this patient is INPATIENT. Inpatient status is judged to be reasonable and necessary in order to provide the required intensity of service to ensure the  patient's safety. The patient's presenting symptoms, physical exam findings, and initial radiographic and laboratory data in the context of their chronic comorbidities is felt to place them at high risk for further  clinical deterioration. Furthermore, it is not anticipated that the patient will be medically stable for discharge from the hospital within 2 midnights of admission.   * I certify that at the point of admission it is my clinical judgment that the patient will require inpatient hospital care spanning beyond 2 midnights from the point of admission due to high intensity of service, high risk for further deterioration and high frequency of surveillance required.*  To reach the provider On-Call:   7AM- 7PM see care teams to locate the attending and reach out to them via [email or web address] Password: [email or web address] 7PM-7AM contact night-coverage If you still have difficulty reaching the appropriate provider, please page the [facility] (Director on Call) for [facility] on amion for assistance  This document was prepared using Conservation officer, historic buildings and may include unintentional dictation errors.  Rockie Rams FNP-BC, PMHNP-BC Nurse Practitioner [facility]

## 2024-08-15 NOTE — Progress Notes (Signed)
 Pharmacy Antibiotic Note  Edward [name] is a 83 y.o. male admitted on 08/15/2024 with post obstructive pneumonia.  Pharmacy has been consulted for vanc/cefepime dosing.  Plan: Vancomycin 1500mg  IV x 1 then 1250mg  IV q24 - goal AUC 400-550 Cefepime 2g IV q12 D/C vancomycin if MRSA PCR not detected     Temp (24hrs), Avg:98.6 F (37 C), Min:98 F (36.7 C), Max:99.2 F (37.3 C)  Recent Labs  Lab 08/15/24 1242 08/15/24 1245 08/15/24 1256  WBC 12.2*  --   --   CREATININE 1.10  --  1.10  LATICACIDVEN  --  1.1  --     Estimated Creatinine Clearance: 51.8 mL/min (by C-G formula based on SCr of 1.1 mg/dL).    Allergies  Allergen Reactions   Bystolic [Nebivolol Hcl]      Thank you for allowing pharmacy to be a part of this patient's care.  Britta Eva Na 08/15/2024 3:05 PM

## 2024-08-15 NOTE — Progress Notes (Signed)
 Radiation Oncology         [phone] ________________________________  Inpatient Consultation Note  Name: Edward [name] MRN: [id number]  Date: 08/15/2024  DOB: [birth year]  RR:Yzwdnw, Boby CROME, NP-C  Sherrod Sherrod, MD   REFERRING PHYSICIAN: Sherrod Sherrod, MD  DIAGNOSIS: The encounter diagnosis was Primary malignant neuroendocrine neoplasm of lung (HCC).  Stage IIIC primary bronchogenic carcinoma  HISTORY OF PRESENT ILLNESS::Edward [name] is a 83 y.o. male who is accompanied by his supportive wife and family member. he is seen as a courtesy of Dr. Sherrod for an opinion concerning radiation therapy as part of management for his recently diagnosed lung cancer.  Patient presented to the his PCP with complains of right-side pain after playing volleyball. He had a chest x-ray on October 16 revealed a mass in the right infrahilar area of the lung. CT chest performed on 07/02/24 showed a large, lobular mass of the inferior right hilum, with frank occlusion of the right middle lobe bronchi and marked narrowing of the right lower lobar and proximal segmental airways with findings are consistent with primary lung malignancy. Mass is inseparable from the right hilum; no other evidence of mediastinal lymphadenopathy nor distant metastatic disease in the chest, abdomen, or pelvis.  In light of findings, he was referred to Dr. Catherine on 07/15/24 to discuss further treatment options. They opted to proceed with a bilateral bronchoscopy with EBUS which he underwent on 07/18/24. Surgical pathology indicated presence of atypical cells suggestive of bronchial cells with reactive atypia.     PET scan performed on 07/22/24 showing mildly hypermetabolic right perihilar mass with hypermetabolic lymph nodes extending to the contralateral mediastinum and possibly low left internal jugular station, compatible with T3N3M0 or stage IIIC primary bronchogenic carcinoma. Brain MRI done on 11/10 showed no  findings concerning for metastatic disease.    Patient underwent a repeat EBUS on 07/30/24 with pathology of right lower lobe fine needle aspiration and brushing indicating neuroendocrine carcinoma of the lung, specifically atypical carcinoid.   Patient saw Dr. Sherrod   He was seen in the ED on 11/29 with complains of persistent dry cough for which he was treated with an inhaler and an antibiotic course for.   Most recently, he was seen in Dr. Sherrod on 08/05/2024.  At that time, he recommended referral to radiation oncology for evaluation and discussion of radiation therapy.  In the interim, the patient presented to Dr. Catherine on 08/14/2024.  He was placed on Levaquin  for treatment of possible postobstructive pneumonia. He complained of dyspnea on exertion and a D-dimer was ordered to rule out DVT/PE. D-Dimer was positive and patient complained of worsening shortness of breath. He was therefore instructed to go to the ED.  He presented to the ED earlier today.  CTA performed on 08/15/2024 demonstrated no pulmonary embolism; an enlarging right infra hilar mass; new dense airspace consolidation filling the majority of the right lower lobe; reflux of contrast into the hepatic veins; redemonstrated multistation mediastinal lymph nodes with a more prominent subcarinal lymph node. Patient was placed on vancomycin  for treatment of post-obstructive pneumonia.    We saw the patient at bedside today on 4 L of oxygen.  He notes an approximate 1 week history of worsening shortness of breath, unproductive cough, decreased appetite, and intermittent episodes of full body chills with no documented fevers.  He denies any hemoptysis, dysphagia, or odynophagia.   PREVIOUS RADIATION THERAPY: No  PAST MEDICAL HISTORY:  Past Medical History:  Diagnosis Date  BPH (benign prostatic hyperplasia)    Cataract    surgical removal bilateral   CKD (chronic kidney disease) stage 3, GFR 30-59 ml/min (HCC)    DM  (diabetes mellitus), type 2 (HCC)    ED (erectile dysfunction)    GERD (gastroesophageal reflux disease)    History of adenomatous polyp of colon 11/02/2017   Tubular adenoma 2013; recommended 5 year follow up   History of kidney stones    Hyperlipidemia    Hypertension    IBS (irritable bowel syndrome)    Kidney stones 02/11/2020   LBBB (left bundle branch block)    Lung nodule    Personal history of kidney stones    Vitamin D  deficiency     PAST SURGICAL HISTORY: Past Surgical History:  Procedure Laterality Date   CATARACT EXTRACTION Right 2015   CATARACT EXTRACTION W/ INTRAOCULAR LENS IMPLANT  2013   left   COLONOSCOPY  2013   TA   ENDOBRONCHIAL ULTRASOUND Bilateral 07/30/2024   Procedure: ENDOBRONCHIAL ULTRASOUND (EBUS);  Surgeon: Isadora Hose, MD;  Location: [facility];  Service: Pulmonary;  Laterality: Bilateral;   EXTRACORPOREAL SHOCK WAVE LITHOTRIPSY Left 12/11/2017   Procedure: LEFT EXTRACORPOREAL SHOCK WAVE LITHOTRIPSY (ESWL);  Surgeon: Carolee Sherwood JONETTA DOUGLAS, MD;  Location: [facility];  Service: Urology;  Laterality: Left;   KIDNEY STONE SURGERY     POLYPECTOMY     VIDEO BRONCHOSCOPY WITH ENDOBRONCHIAL ULTRASOUND Bilateral 07/18/2024   Procedure: BRONCHOSCOPY, WITH EBUS;  Surgeon: Catherine Cools, MD;  Location: [facility];  Service: Pulmonary;  Laterality: Bilateral;    FAMILY HISTORY:  Family History  Problem Relation Age of Onset   Diabetes Sister    Hypertension Sister    Heart disease Mother    Diabetes Sister    Hypertension Sister    Colon cancer Neg Hx    Colon polyps Neg Hx    Esophageal cancer Neg Hx    Rectal cancer Neg Hx    Stomach cancer Neg Hx     SOCIAL HISTORY:  Social History   Tobacco Use   Smoking status: Former    Current packs/day: 0.00    Average packs/day: 0.5 packs/day for 10.0 years (5.0 ttl pk-yrs)    Types: Cigarettes    Start date: 09/12/1957    Quit date: 09/13/1967    Years since quitting: 56.9    Passive exposure: Past    Smokeless tobacco: Never  Vaping Use   Vaping status: Never Used  Substance Use Topics   Alcohol  use: No   Drug use: No    ALLERGIES:  Allergies  Allergen Reactions   Bystolic [Nebivolol Hcl] Other (See Comments)    Reaction not recalled    MEDICATIONS:  No current facility-administered medications for this encounter.   No current outpatient medications on file.   Facility-Administered Medications Ordered in Other Encounters  Medication Dose Route Frequency Provider Last Rate Last Admin   acetaminophen  (TYLENOL ) tablet 650 mg  650 mg Oral Q6H PRN Foust, Katy L, NP   650 mg at 08/17/24 0816   Or   acetaminophen  (TYLENOL ) suppository 650 mg  650 mg Rectal Q6H PRN Foust, Katy L, NP       albuterol  (PROVENTIL ) (2.5 MG/3ML) 0.083% nebulizer solution 2.5 mg  2.5 mg Nebulization Q4H PRN Foust, Katy L, NP       arformoterol  (BROVANA ) nebulizer solution 15 mcg  15 mcg Nebulization BID Olalere, Adewale A, MD   15 mcg at 08/17/24 0742   artificial tears ophthalmic solution  1 drop  1 drop Both Eyes PRN Jadine Toribio SQUIBB, MD   1 drop at 08/16/24 1119   aspirin  EC tablet 81 mg  81 mg Oral Once per day on Monday Wednesday Friday Jurline Pee L, NP   81 mg at 08/16/24 9175   atorvastatin  (LIPITOR) tablet 40 mg  40 mg Oral QPM Foust, Katy L, NP   40 mg at 08/17/24 1632   budesonide  (PULMICORT ) nebulizer solution 0.25 mg  0.25 mg Nebulization BID Olalere, Adewale A, MD   0.25 mg at 08/17/24 0742   ceFEPIme  (MAXIPIME ) 2 g in sodium chloride  0.9 % 100 mL IVPB  2 g Intravenous Q12H Britta Eva HERO, [facility]   Stopped at 08/17/24 1351   enoxaparin  (LOVENOX ) injection 40 mg  40 mg Subcutaneous Daily Foust, Katy L, NP   40 mg at 08/17/24 0817   feeding supplement (ENSURE PLUS HIGH PROTEIN) liquid 237 mL  237 mL Oral BID BM Zella, Mir M, MD   237 mL at 08/17/24 0816   guaiFENesin -dextromethorphan  (ROBITUSSIN DM) 100-10 MG/5ML syrup 10 mL  10 mL Oral TID Neda Hammond A, MD   10 mL at 08/17/24 1632    insulin  aspart (novoLOG ) injection 0-15 Units  0-15 Units Subcutaneous TID WC Foust, Katy L, NP   3 Units at 08/17/24 1633   insulin  aspart (novoLOG ) injection 0-5 Units  0-5 Units Subcutaneous QHS Foust, Katy L, NP       losartan  (COZAAR ) tablet 50 mg  50 mg Oral Daily Foust, Katy L, NP   50 mg at 08/17/24 0817   melatonin tablet 5 mg  5 mg Oral QHS PRN Foust, Katy L, NP       ondansetron  (ZOFRAN ) tablet 4 mg  4 mg Oral Q6H PRN Foust, Katy L, NP   4 mg at 08/17/24 1647   Or   ondansetron  (ZOFRAN ) injection 4 mg  4 mg Intravenous Q6H PRN Foust, Katy L, NP       oxyCODONE  (Oxy IR/ROXICODONE ) immediate release tablet 5-10 mg  5-10 mg Oral Q6H PRN Jadine Toribio SQUIBB, MD       pantoprazole  (PROTONIX ) EC tablet 40 mg  40 mg Oral Daily Foust, Katy L, NP   40 mg at 08/17/24 0816   polyethylene glycol (MIRALAX  / GLYCOLAX ) packet 17 g  17 g Oral Daily PRN Foust, Katy L, NP       revefenacin  (YUPELRI ) nebulizer solution 175 mcg  175 mcg Nebulization Daily Olalere, Adewale A, MD   175 mcg at 08/17/24 9257   tamsulosin  (FLOMAX ) capsule 0.4 mg  0.4 mg Oral QPM Foust, Katy L, NP   0.4 mg at 08/17/24 1632    REVIEW OF SYSTEMS: Notable for the above   PHYSICAL EXAM: General: Alert and orientated, ill-appearing male. On 4L of oxygen.  HEENT: Head is normocephalic. Extraocular movements are intact. Lips are blue in appearance.  Neck: Neck is supple, no palpable cervical or supraclavicular lymphadenopathy. Heart: Regular in rate and rhythm with no murmurs, rubs, or gallops. Chest: Diminished lung sounds in all lung fields.  Abdomen: Soft, nontender, nondistended, with no rigidity or guarding. Extremities: No cyanosis or edema. Lymphatics: see Neck Exam Skin: Warm to touch.  No concerning lesions. Musculoskeletal: symmetric strength and muscle tone throughout. Frail.  Neurologic: Cranial nerves II through XII are grossly intact. No obvious focalities. Speech is fluent. Coordination is intact. Psychiatric:  Judgment and insight are intact. Affect is appropriate.  ECOG = 4  0 - Asymptomatic (Fully  active, able to carry on all predisease activities without restriction)  1 - Symptomatic but completely ambulatory (Restricted in physically strenuous activity but ambulatory and able to carry out work of a light or sedentary nature. For example, light housework, office work)  2 - Symptomatic, <50% in bed during the day (Ambulatory and capable of all self care but unable to carry out any work activities. Up and about more than 50% of waking hours)  3 - Symptomatic, >50% in bed, but not bedbound (Capable of only limited self-care, confined to bed or chair 50% or more of waking hours)  4 - Bedbound (Completely disabled. Cannot carry on any self-care. Totally confined to bed or chair)  5 - Death   Raylene MM, Creech RH, Tormey DC, et al. [phone]). Toxicity and response criteria of the [facility]. Am. DOROTHA Bridges. Oncol. 5 (6): 649-55  LABORATORY DATA:  Lab Results  Component Value Date   WBC 11.6 (H) 08/16/2024   HGB 11.8 (L) 08/16/2024   HCT 35.3 (L) 08/16/2024   MCV 88.0 08/16/2024   PLT 229 08/16/2024   NEUTROABS 10.8 (H) 08/15/2024   Lab Results  Component Value Date   NA 135 08/17/2024   K 3.9 08/17/2024   CL 102 08/17/2024   CO2 25 08/17/2024   GLUCOSE 176 (H) 08/17/2024   BUN 17 08/17/2024   CREATININE 1.18 08/17/2024   CALCIUM  8.6 (L) 08/17/2024      RADIOGRAPHY: ECHOCARDIOGRAM COMPLETE Result Date: 08/17/2024    ECHOCARDIOGRAM REPORT   Patient Name:   Edward [name] Date of Exam: 08/17/2024 Medical Rec #:  [id number]         Height:       69.0 in Accession #:    [id number]        Weight:       157.2 lb Date of Birth:  February 26, 1941         BSA:          1.865 m Patient Age:    83 years          BP:           110/70 mmHg Patient Gender: M                 HR:           79 bpm. Exam Location:  Inpatient Procedure: 2D Echo, Intracardiac Opacification Agent, Color  Doppler and Cardiac            Doppler (Both Spectral and Color Flow Doppler were utilized during            procedure). Indications:    Dyspnea R06.00  History:        Patient has no prior history of Echocardiogram examinations.                 Risk Factors:Diabetes, Hypertension and Dyslipidemia.  Sonographer:    Tinnie Gosling RDCS Referring Phys: [id number] DANIEL P GOODRICH IMPRESSIONS  1. Left ventricular ejection fraction, by estimation, is 40 to 45%. The left ventricle has mildly decreased function. The left ventricle has no regional wall motion abnormalities. There is mild left ventricular hypertrophy. Left ventricular diastolic parameters are consistent with Grade I diastolic dysfunction (impaired relaxation).  2. Right ventricular systolic function is normal. The right ventricular size is normal.  3. Left atrial size was moderately dilated.  4. A small pericardial effusion is present. The pericardial effusion is anterior to the right ventricle and localized near  the right ventricle.  5. The mitral valve is abnormal. Trivial mitral valve regurgitation. No evidence of mitral stenosis.  6. The aortic valve is tricuspid. There is mild calcification of the aortic valve. There is mild thickening of the aortic valve. Aortic valve regurgitation is not visualized. Aortic valve sclerosis is present, with no evidence of aortic valve stenosis.  7. The inferior vena cava is dilated in size with >50% respiratory variability, suggesting right atrial pressure of 8 mmHg. FINDINGS  Left Ventricle: Abnormal septal motion and ventricular ectopy during study. Left ventricular ejection fraction, by estimation, is 40 to 45%. The left ventricle has mildly decreased function. The left ventricle has no regional wall motion abnormalities. Definity  contrast agent was given IV to delineate the left ventricular endocardial borders. Strain was performed and the global longitudinal strain is indeterminate. The left ventricular internal cavity  size was normal in size. There is mild left ventricular hypertrophy. Left ventricular diastolic parameters are consistent with Grade I diastolic dysfunction (impaired relaxation). Right Ventricle: The right ventricular size is normal. No increase in right ventricular wall thickness. Right ventricular systolic function is normal. Left Atrium: Left atrial size was moderately dilated. Right Atrium: Right atrial size was normal in size. Pericardium: A small pericardial effusion is present. The pericardial effusion is anterior to the right ventricle and localized near the right ventricle. Mitral Valve: The mitral valve is abnormal. There is mild thickening of the mitral valve leaflet(s). Trivial mitral valve regurgitation. No evidence of mitral valve stenosis. Tricuspid Valve: The tricuspid valve is normal in structure. Tricuspid valve regurgitation is mild . No evidence of tricuspid stenosis. Aortic Valve: The aortic valve is tricuspid. There is mild calcification of the aortic valve. There is mild thickening of the aortic valve. Aortic valve regurgitation is not visualized. Aortic valve sclerosis is present, with no evidence of aortic valve stenosis. Pulmonic Valve: The pulmonic valve was normal in structure. Pulmonic valve regurgitation is trivial. No evidence of pulmonic stenosis. Aorta: The aortic root is normal in size and structure. Venous: The inferior vena cava is dilated in size with greater than 50% respiratory variability, suggesting right atrial pressure of 8 mmHg. IAS/Shunts: No atrial level shunt detected by color flow Doppler. Additional Comments: 3D was performed not requiring image post processing on an independent workstation and was indeterminate.  LEFT VENTRICLE PLAX 2D LVIDd:         5.60 cm      Diastology LVIDs:         4.30 cm      LV e' medial:    4.90 cm/s LV PW:         1.20 cm      LV E/e' medial:  10.7 LV IVS:        1.30 cm      LV e' lateral:   7.29 cm/s LVOT diam:     2.30 cm      LV E/e'  lateral: 7.2 LV SV:         64 LV SV Index:   34 LVOT Area:     4.15 cm  LV Volumes (MOD) LV vol d, MOD A2C: 133.0 ml LV vol d, MOD A4C: 114.0 ml LV vol s, MOD A2C: 81.1 ml LV vol s, MOD A4C: 80.3 ml LV SV MOD A2C:     51.9 ml LV SV MOD A4C:     114.0 ml LV SV MOD BP:      47.7 ml RIGHT VENTRICLE  IVC RV S prime:     17.60 cm/s  IVC diam: 2.70 cm LEFT ATRIUM             Index        RIGHT ATRIUM           Index LA diam:        4.30 cm 2.31 cm/m   RA Area:     12.90 cm LA Vol (A2C):   56.6 ml 30.35 ml/m  RA Volume:   29.50 ml  15.82 ml/m LA Vol (A4C):   43.6 ml 23.38 ml/m LA Biplane Vol: 50.7 ml 27.19 ml/m  AORTIC VALVE LVOT Vmax:   74.60 cm/s LVOT Vmean:  49.000 cm/s LVOT VTI:    0.153 m  AORTA Ao Root diam: 3.60 cm Ao Asc diam:  3.70 cm MITRAL VALVE               TRICUSPID VALVE MV Area (PHT): 2.86 cm    TR Peak grad:   24.4 mmHg MV E velocity: 52.30 cm/s  TR Vmax:        247.00 cm/s MV A velocity: 76.30 cm/s MV E/A ratio:  0.69        SHUNTS                            Systemic VTI:  0.15 m                            Systemic Diam: 2.30 cm Maude Emmer MD Electronically signed by Maude Emmer MD Signature Date/Time: 08/17/2024/11:25:20 AM    Final    DG Chest 2 View Result Date: 08/15/2024 CLINICAL DATA:  post bronch. rule out pneumothorax EXAM: CHEST - 2 VIEW COMPARISON:  July 30, 2024 FINDINGS: Increasing airspace consolidation in the right lower lobe. Small pleural effusion on the right. No pneumothorax. The right infrahilar lung mass is less conspicuous on today's radiograph due to the airspace disease in the right lung base. Mild cardiomegaly. Aortic atherosclerosis. No acute fracture or destructive lesions. Multilevel thoracic osteophytosis. IMPRESSION: Increasing airspace consolidation in the right lower lobe, possibly worsening atelectasis or airspace disease given the patient's right infrahilar lung mass. Small right pleural effusion. Electronically Signed   By: Rogelia Myers  M.D.   On: 08/15/2024 15:29   CT Angio Chest PE W and/or Wo Contrast Result Date: 08/15/2024 CLINICAL DATA:  Pulmonary embolism (PE) suspected, low to intermediate prob, positive D-dimer EXAM: CT ANGIOGRAPHY CHEST WITH CONTRAST TECHNIQUE: Multidetector CT imaging of the chest was performed using the standard protocol during bolus administration of intravenous contrast. Multiplanar CT image reconstructions and MIPs were obtained to evaluate the vascular anatomy. RADIATION DOSE REDUCTION: This exam was performed according to the departmental dose-optimization program which includes automated exposure control, adjustment of the mA and/or kV according to patient size and/or use of iterative reconstruction technique. CONTRAST:  75mL OMNIPAQUE  IOHEXOL  350 MG/ML SOLN COMPARISON:  08/14/2024, 07/22/2024, 07/02/2024 FINDINGS: Pulmonary Embolism: No pulmonary embolism. Cardiovascular: Mild cardiomegaly. Moderate volume pericardial effusion.No aortic aneurysm. Mediastinum/Nodes: No mediastinal mass.Subcarinal lymph node measuring 1.4 cm (axial 76). Unchanged enlarged lower left paratracheal lymph node. Lungs/Pleura: The midline trachea and bronchi are patent. Redemonstration of a right infrahilar mass, which measures 6 x 7.7 x 6.4 cm (previously, 5.7 x 6.3 x 5.6 cm). The mass is again noted to completely obstruct the right middle lobe bronchus with severe to near  complete narrowing of the right lower lobe bronchi. The mass also significantly narrows the right interlobar pulmonary artery in the right middle lobe pulmonary artery with complete occlusion of the superior right pulmonary vein. Interval development of dense airspace consolidation filling the majority of the right lower lobe with unchanged, but significant atelectasis of the right middle lobe. No pneumothorax. Small right pleural effusion. Musculoskeletal: No acute fracture. Similar lucent lesion in the right scapula measuring 1.3 cm, which was not FDG avid on the  prior PET-CT. Multilevel degenerative disc disease of the spine. Thoracic DISH. Upper Abdomen: No acute abnormality in the partially visualized upper abdomen. Reflux of contrast into the hepatic veins. Review of the MIP images confirms the above findings. IMPRESSION: 1. No pulmonary embolism. 2. Enlarging right infrahilar mass, as measured above. This causes complete obstruction of the right middle lobe bronchus with severe to near-complete narrowing of the right lower lobe bronchi. New dense airspace consolidation filling the majority of the right lower lobe. While this may represent right lower lobe atelectasis, it would be worrisome for postobstructive pneumonia, in the correct clinical context. Small right pleural effusion. 3. Reflux of contrast into the hepatic veins is suggestive of underlying cardiac dysfunction. 4. Redemonstrated multistation mediastinal lymph nodes, with a more prominent subcarinal lymph node, concerning for worsening metastatic disease. Aortic Atherosclerosis (ICD10-I70.0). Electronically Signed   By: Rogelia Myers M.D.   On: 08/15/2024 15:26   VAS US  LOWER EXTREMITY VENOUS (DVT) (ONLY [facility]) Result Date: 08/15/2024  Lower Venous DVT Study Patient Name:  Parminder R Heyboer  Date of Exam:   08/15/2024 Medical Rec #: [id number]          Accession #:    [id number] Date of Birth: July 17, 1941          Patient Gender: M Patient Age:   39 years Exam Location:  [facility] Procedure:      VAS US  LOWER EXTREMITY VENOUS (DVT) Referring Phys: MAUDE MESSICK --------------------------------------------------------------------------------  Indications: SOB. Other Indications: History of lung cancer. Risk Factors: COPD. Comparison Study: No priors. Performing Technologist: Ricka Sturdivant-Jones RDMS, RVT  Examination Guidelines: A complete evaluation includes B-mode imaging, spectral Doppler, color Doppler, and power Doppler as needed of all accessible portions of each vessel. Bilateral  testing is considered an integral part of a complete examination. Limited examinations for reoccurring indications may be performed as noted. The reflux portion of the exam is performed with the patient in reverse Trendelenburg.  +---------+---------------+---------+-----------+----------+--------------+ RIGHT    CompressibilityPhasicitySpontaneityPropertiesThrombus Aging +---------+---------------+---------+-----------+----------+--------------+ CFV      Full           Yes      Yes                                 +---------+---------------+---------+-----------+----------+--------------+ SFJ      Full                                                        +---------+---------------+---------+-----------+----------+--------------+ FV Prox  Full                                                        +---------+---------------+---------+-----------+----------+--------------+  FV Mid   Full           Yes      Yes                                 +---------+---------------+---------+-----------+----------+--------------+ FV DistalFull                                                        +---------+---------------+---------+-----------+----------+--------------+ PFV      Full                                                        +---------+---------------+---------+-----------+----------+--------------+ POP      Full           Yes      Yes                                 +---------+---------------+---------+-----------+----------+--------------+ PTV      Full                                                        +---------+---------------+---------+-----------+----------+--------------+ PERO     Full                                                        +---------+---------------+---------+-----------+----------+--------------+   +---------+---------------+---------+-----------+----------+--------------+ LEFT      CompressibilityPhasicitySpontaneityPropertiesThrombus Aging +---------+---------------+---------+-----------+----------+--------------+ CFV      Full           Yes      Yes                                 +---------+---------------+---------+-----------+----------+--------------+ SFJ      Full                                                        +---------+---------------+---------+-----------+----------+--------------+ FV Prox  Full                                                        +---------+---------------+---------+-----------+----------+--------------+ FV Mid   Full           No       Yes                                 +---------+---------------+---------+-----------+----------+--------------+  FV DistalFull                                                        +---------+---------------+---------+-----------+----------+--------------+ PFV      Full                                                        +---------+---------------+---------+-----------+----------+--------------+ POP      Full           Yes      Yes                                 +---------+---------------+---------+-----------+----------+--------------+ PTV      Full                                                        +---------+---------------+---------+-----------+----------+--------------+ PERO     Full                                                        +---------+---------------+---------+-----------+----------+--------------+     Summary: BILATERAL: - No evidence of deep vein thrombosis seen in the lower extremities, bilaterally. -No evidence of popliteal cyst, bilaterally.   *See table(s) above for measurements and observations. Electronically signed by Debby Robertson on 08/15/2024 at 2:26:10 PM.    Final    DG Chest Port 1 View Result Date: 07/30/2024 EXAM: 1 VIEW(S) XRAY OF THE CHEST 07/30/2024 11:08:51 AM COMPARISON: 06/27/2024 CLINICAL HISTORY: S/P  Bronchoscopy FINDINGS: LUNGS AND PLEURA: Right lower lobe infrahilar masslike fullness persists. Low lung volumes with crowding of vasculature and potentially atelectasis in the left lower lobe. Previous blunting of the right lateral costophrenic angle has resolved. No pleural effusion. No pneumothorax. HEART AND MEDIASTINUM: No acute abnormality of the cardiac and mediastinal silhouettes. BONES AND SOFT TISSUES: Thoracic spondylosis. No acute osseous abnormality. IMPRESSION: 1. Right lower lobe infrahilar masslike fullness persists. 2. Low lung volumes with crowding of vasculature and probable left lower lobe atelectasis. 3. Thoracic spondylosis. Electronically signed by: Ryan Salvage MD 07/30/2024 11:31 AM EST RP Workstation: [id number]   MR BRAIN W WO CONTRAST Result Date: 07/26/2024 EXAM: MRI BRAIN WITH AND WITHOUT CONTRAST 07/22/2024 01:21:28 PM TECHNIQUE: Multiplanar multisequence MRI of the head/brain was performed with and without the administration of 7 mL gadobutrol  (GADAVIST ) 1 MMOL/ML injection. COMPARISON: None available. CLINICAL HISTORY: Non-small cell lung cancer (NSCLC), staging. FINDINGS: BRAIN AND VENTRICLES: There is age-related cerebral volume loss present and there is mild-to-moderate cerebral white matter disease. There are no findings concerning for metastatic disease. No acute infarct. No acute intracranial hemorrhage. No mass effect or midline shift. No hydrocephalus. The sella is unremarkable. Normal flow voids. No mass or abnormal enhancement. ORBITS: No acute abnormality. SINUSES: No acute abnormality. BONES AND SOFT TISSUES:  Normal bone marrow signal and enhancement. No acute soft tissue abnormality. IMPRESSION: 1. No acute intracranial abnormality. 2. No findings concerning for metastatic disease. 3. Age-related cerebral volume loss and mild-to-moderate cerebral white matter disease. Electronically signed by: Evalene Coho MD 07/26/2024 08:58 AM EST RP Workstation:  [id number]   NM PET Image Initial (PI) Skull Base To Thigh (F-18 FDG) Result Date: 07/24/2024 CLINICAL DATA:  Initial treatment strategy for lung nodule. EXAM: NUCLEAR MEDICINE PET SKULL BASE TO THIGH TECHNIQUE: 8.7 mCi F-18 FDG was injected intravenously. Full-ring PET imaging was performed from the skull base to thigh after the radiotracer. CT data was obtained and used for attenuation correction and anatomic localization. Fasting blood glucose: 125 mg/dl COMPARISON:  CT chest abdomen pelvis 07/02/2024. FINDINGS: Mediastinal blood pool activity: SUV max 2.1 Liver activity: SUV max NA NECK: No abnormal hypermetabolism. Incidental CT findings: None. CHEST: A mildly hypermetabolic mass is seen predominantly in the perihilar right middle lobe with some involvement of the superior segment right lower lobe, 5.6 x 6.2 cm (6/67), SUV max 3.2. Right hilar lymph node measures 8 mm, SUV max 2.6. Low left paratracheal lymph nodes measure up to 12 mm (6/55), SUV max 2.9. Low left internal jugular lymph node measures 7 mm, SUV max 2.2. Incidental CT findings: Atherosclerotic calcification of the aorta and coronary arteries. Heart is enlarged. Small pericardial effusion. Aforementioned mass obstructs the right middle lobe bronchus with associated collapse of the right middle lobe. Associated narrowing of the left lower lobe bronchus. Small right pleural effusion. ABDOMEN/PELVIS: Focal anorectal hypermetabolism, SUV max 9.4. No additional abnormal hypermetabolism. Incidental CT findings: There may be sludge in the gallbladder. Low-attenuation lesions in the kidneys. No specific follow-up necessary. Small right renal stone. Tiny hiatal hernia. Left inguinal hernia contains a knuckle of sigmoid colon. SKELETON: No abnormal hypermetabolism. Incidental CT findings: Bilateral L5 pars defects with severe grade 1 or mild grade 2 anterolisthesis of L5 on S1. Degenerative changes in the spine. IMPRESSION: 1. Mildly hypermetabolic  right perihilar mass with hypermetabolic lymph nodes extending to the contralateral mediastinum and possibly low left internal jugular station, compatible with T3N3M0 or stage IIIC primary bronchogenic carcinoma. 2. Focal anorectal hypermetabolism, indeterminate. Malignancy cannot be excluded. 3. Small pericardial effusion. 4. Small right pleural effusion. 5. Postobstructive right middle lobe collapse. 6. Small right renal stone. 7. Bilateral L5 pars defects with severe grade 1 or mild grade 2 anterolisthesis. 8. Aortic atherosclerosis (ICD10-I70.0). Coronary artery calcification. Electronically Signed   By: Newell Eke M.D.   On: 07/24/2024 09:35      IMPRESSION: Stage IIIC (T3, N3, M0) well-differentiated neuroendocrine tumor, suspicious for atypical carcinoid presented with right suprahilar mass with occlusion of the right middle lobe and constriction of the right lower lobe bronchus in addition to hypermetabolic lymph node extending to the contralateral mediastinum and possibly left internal jugular station.    We reviewed this patient's current work-up. He presents today with an enlarging 7.7 cm right infrahilar mass resulting in complete obstruction of the RML and severe narrowing of the RLL. Mass has been biopsy confirmed well-differentiated neuroendocrine tumor, suspicious for atypical carcinoid.  PET and brain MRI demonstrate no signs of distant metastatic disease. Mass has unfortunately caused a postobstructive pneumonia resulting in acute hypoxic respiratory failure. He has subsequently been admitted for further management.   Dr. Shannon recommends a 6.5 week course of radiation directed at the right lung mass and surrounding lymph nodes. We will defer to medical oncology for consideration of systemic treatment. Given  his compromised respiratory status, Dr. Shannon is recommending urgent radiation. Systemic treatment will be deferred to Dr. Sherrod.   Today, I talked to the patient and family  about the findings and work-up thus far.  We discussed the natural history of stage III lung cancer and general treatment, highlighting the role of radiotherapy in the management.  We discussed the available radiation techniques, and focused on the details of logistics and delivery.  We reviewed the anticipated acute and late sequelae associated with radiation in this setting.  The patient was encouraged to ask questions that I answered to the best of my ability. A patient consent form was discussed and signed.  We retained a copy for our records.  The patient would like to proceed with radiation and will be scheduled for CT simulation.  PLAN: Patient is scheduled for CT simulation on 08/16/2024. Dr. Shannon anticipates 33 fractions directed at the right lung mass. We look forward to participating in this patient's care.    60 minutes of total time was spent for this patient encounter, including preparation, face-to-face counseling with the patient and coordination of care, physical exam, and documentation of the encounter.   ------------------------------------------------   Leeroy Due, PA-C   Lynwood CHARM Shannon, PhD, MD   [facility]  Radiation Oncology Direct Dial: [phone]  Fax: [phone] .com    This document serves as a record of services personally performed by Lynwood Shannon, MD and Leeroy Due, PA-C. It was created on their behalf by Reymundo Cartwright, a trained medical scribe. The creation of this record is based on the scribe's personal observations and the provider's statements to them. This document has been checked and approved by the attending provider.

## 2024-08-15 NOTE — ED Provider Notes (Addendum)
 [place] EMERGENCY DEPARTMENT AT [facility] Provider Note   CSN: [id number] Arrival date & time: 08/15/24  1148     Patient presents with: No chief complaint on file.   Edward [name] is a 83 y.o. male.   83 year old male with prior medical history as detailed below presents for evaluation.  Patient known to Pulmonary.  Patient with known right lung cancer.  Patient was noted to have collapse of the right middle and lower lobe yesterday on imaging with Dr. Catherine.   Patient with increased shortness of breath today.  Patient has been on Levaquin for treatment of possible postobstructive pneumonia.  Pulmonary is also concerned about possibility of concurrent PE.  The history is provided by the patient and medical records.       Prior to Admission medications   Medication Sig Start Date End Date Taking? Authorizing Provider  albuterol (ACCUNEB) 1.25 MG/3ML nebulizer solution Inhale 3 mL (1.25 mg total) by nebulization every 6 (six) hours as needed for wheezing. 08/14/24   Alghanim, Fahid, MD  albuterol (VENTOLIN HFA) 108 (90 Base) MCG/ACT inhaler Inhale 2 puffs into the lungs every 4 (four) hours as needed for wheezing or shortness of breath. 08/10/24   Iola Lukes, FNP  APPLE CIDER VINEGAR PO Take 1-2 tablets by mouth daily.    [provider]  aspirin 81 MG tablet Take 81 mg by mouth 3 (three) times a week.    [provider]  atorvastatin  (LIPITOR) 40 MG tablet Take 1 tablet (40 mg total) by mouth every evening for cholesterol. 06/27/24   Alvia Corean CROME, FNP  atorvastatin  (LIPITOR) 40 MG tablet Take 1 tablet (40 mg total) by mouth every evening for cholesterol 05/30/24     benzonatate (TESSALON) 100 MG capsule Take 1 capsule (100 mg total) by mouth 3 (three) times daily as needed for cough. 08/10/24   Murrill, Samantha, FNP  Cholecalciferol (VITAMIN D  PO) Take 5,000 Units by mouth daily.    [provider]  CINNAMON PO Take  1-2 tablets by mouth daily.    [provider]  Cyanocobalamin (VITAMIN B 12 PO) Take 1 tablet by mouth daily.    [provider]  empagliflozin  (JARDIANCE ) 10 MG TABS tablet Take 1 tablet (10 mg total) by mouth daily. 08/07/24   [name], NP-C  erythromycin  ophthalmic ointment Apply ointment to eye sutures twice daily for 14 days 04/03/24     famotidine  (PEPCID ) 40 MG tablet Take 1 tablet (40 mg total) by mouth every evening to prevent heartburn and indigestion. 06/27/24   Alvia Corean CROME, FNP  fluticasone  (FLONASE ) 50 MCG/ACT nasal spray Place 1-2 sprays into both nostrils daily. 06/27/24   Alvia Corean CROME, FNP  glucose blood (ONETOUCH VERIO) test strip USE TO TEST BLOOD SUGAR ONCE DAILY 06/27/24   Alvia Corean L, FNP  glucose blood test strip Use 1 strip to check glucose once daily. 06/27/24   Alvia Corean CROME, FNP  levofloxacin (LEVAQUIN) 750 MG tablet Take 1 tablet (750 mg total) by mouth daily. 08/14/24   Alghanim, Paula, MD  losartan  (COZAAR ) 50 MG tablet Take 1 tablet (50 mg total) by mouth daily for blood pressure. 08/07/24   Henson, Vickie L, NP-C  metFORMIN  (GLUCOPHAGE ) 500 MG tablet Take 1 tablet (500 mg total) by mouth 3 (three) times daily with meals for diabetes. 06/27/24   Alvia Corean CROME, FNP  OMEGA-3 FATTY ACIDS PO Take 700 mg by mouth 3 (three) times a week.  [provider]  pantoprazole  (PROTONIX ) 40 MG tablet Take 1 tablet (40 mg total) by mouth every evening to prevent heartburn and indigestion. 07/16/24   Henson, Vickie L, NP-C  tamsulosin  (FLOMAX ) 0.4 MG CAPS capsule Take 1 capsule (0.4 mg total) by mouth every evening for prostate. 06/27/24   Alvia Corean CROME, FNP  Tiotropium Bromide-Olodaterol (STIOLTO RESPIMAT) 2.5-2.5 MCG/ACT AERS Inhale 2 puffs into the lungs daily. 08/15/24   Alghanim, Paula, MD  tizanidine  (ZANAFLEX ) 2 MG capsule Take 1 capsule (2 mg total) by mouth 2 (two) times daily as needed for  muscle spasms Patient not taking: Reported on 08/14/2024 06/27/24   Alvia Corean CROME, FNP  zinc gluconate 50 MG tablet Take 50 mg by mouth daily.    [provider]    Allergies: Bystolic [nebivolol hcl]    Review of Systems  All other systems reviewed and are negative.   Updated Vital Signs BP (!) 156/97 (BP Location: Right Arm)   Pulse 91   Temp 98 F (36.7 C) (Oral)   Resp (!) 23   SpO2 92%   Physical Exam Vitals and nursing note reviewed.  Constitutional:      General: He is not in acute distress.    Appearance: He is well-developed.  HENT:     Head: Normocephalic and atraumatic.     Nose: Nose normal.     Mouth/Throat:     Mouth: Mucous membranes are moist.  Eyes:     Extraocular Movements: Extraocular movements intact.     Conjunctiva/sclera: Conjunctivae normal.     Pupils: Pupils are equal, round, and reactive to light.  Cardiovascular:     Rate and Rhythm: Normal rate and regular rhythm.     Heart sounds: Normal heart sounds. No murmur heard. Pulmonary:     Effort: Pulmonary effort is normal. No respiratory distress.     Comments: Decreased breath sounds at right base Abdominal:     Palpations: Abdomen is soft.     Tenderness: There is no abdominal tenderness.  Musculoskeletal:        General: No swelling.     Cervical back: Neck supple.  Skin:    General: Skin is warm and dry.     Capillary Refill: Capillary refill takes less than 2 seconds.  Neurological:     Mental Status: He is alert.  Psychiatric:        Mood and Affect: Mood normal.     (all labs ordered are listed, but only abnormal results are displayed) Labs Reviewed  RESP PANEL BY RT-PCR (RSV, FLU A&B, COVID)  [id number]  CULTURE, BLOOD (ROUTINE X 2)  CULTURE, BLOOD (ROUTINE X 2)  CBC WITH DIFFERENTIAL/PLATELET  COMPREHENSIVE METABOLIC PANEL WITH GFR  PRO BRAIN NATRIURETIC PEPTIDE  URINALYSIS, W/ REFLEX TO CULTURE (INFECTION SUSPECTED)  I-STAT CHEM 8, ED  I-STAT CG4 LACTIC  ACID, ED  TROPONIN T, HIGH SENSITIVITY    EKG: None  Radiology: No results found.   Procedures   Medications Ordered in the ED - No data to display                                  Medical Decision Making Patient presents with known history of COPD, lung cancer, partial collapse of right lower and middle lobe.  Patient with increased shortness of breath.  Patient is improved with 2 L nasal cannula O2.  CT imaging does not suggest PE.  There is postobstructive pneumonia seen on CT.  Broad-spectrum antibiotics administered here in the ED.  Patient would benefit from admission.  Pulmonary (Alghanim) is aware of case and will consult.  Hospitalist service made aware of case and will evaluate for admission.    Amount and/or Complexity of Data Reviewed Labs: ordered. Radiology: ordered.  Risk Prescription drug management. Decision regarding hospitalization.   CRITICAL CARE Performed by: Maude JAYSON Galloway   Total critical care time: 30 minutes  Critical care time was exclusive of separately billable procedures and treating other patients.  Critical care was necessary to treat or prevent imminent or life-threatening deterioration.  Critical care was time spent personally by me on the following activities: development of treatment plan with patient and/or surrogate as well as nursing, discussions with consultants, evaluation of patient's response to treatment, examination of patient, obtaining history from patient or surrogate, ordering and performing treatments and interventions, ordering and review of laboratory studies, ordering and review of radiographic studies, pulse oximetry and re-evaluation of patient's condition.      Final diagnoses:  Dyspnea, unspecified type    ED Discharge Orders     None          Galloway Maude JAYSON, MD 08/15/24 1421    Galloway Maude JAYSON, MD 08/15/24 [phone]

## 2024-08-15 NOTE — Progress Notes (Signed)
 Venous duplex lower ext  has been completed. Refer to [facility] under chart review to view preliminary results.   08/15/2024  1:26 PM Damareon Lanni D

## 2024-08-15 NOTE — Telephone Encounter (Signed)
 Noted

## 2024-08-15 NOTE — ED Notes (Signed)
 Dr. Catherine called to notify dmier elevated and is calling to recommend to get CT angio of chest to r/o pulmonary embolism - ED provider at bedside and notified.

## 2024-08-15 NOTE — Consult Note (Signed)
 NAME:  Edward [name], MRN:  [id number], DOB:  07/21/1941, LOS: 0 ADMISSION DATE:  08/15/2024, CONSULTATION DATE: 08/15/2024 REFERRING MD: Dr. Laurice ED, CHIEF COMPLAINT: Postobstructive pneumonia  History of Present Illness:  Patient with a recent diagnosis of well-differentiated neuroendocrine tumor, was seen in pulmonary clinic yesterday with complaints of shortness of breath, cough, fevers.  Started on Levaquin.  Worsening symptoms led to him presenting to the emergency department with increased shortness of breath Diagnosed with neuroendocrine tumor 07/30/2024-had bronchoscopy following evaluation for lung mass  Plan for radiation and chemotherapy-follows with oncology  A week history of fever cough shortness of breath Was seen at urgent care, was given cough medicine and a shot of steroids that did not help Past smoking history, quit in the 70s  Pertinent  Medical History   Past Medical History:  Diagnosis Date   BPH (benign prostatic hyperplasia)    Cataract    surgical removal bilateral   CKD (chronic kidney disease) stage 3, GFR 30-59 ml/min (HCC)    DM (diabetes mellitus), type 2 (HCC)    ED (erectile dysfunction)    GERD (gastroesophageal reflux disease)    History of adenomatous polyp of colon 11/02/2017   Tubular adenoma 2013; recommended 5 year follow up   History of kidney stones    Hyperlipidemia    Hypertension    IBS (irritable bowel syndrome)    Kidney stones 02/11/2020   LBBB (left bundle branch block)    Lung nodule    Personal history of kidney stones    Vitamin D  deficiency    Significant Hospital Events: Including procedures, antibiotic start and stop dates in addition to other pertinent events   07/02/2024 CT chest-large lobular mass inferior right hilum 07/22/2024 hypermetabolic right perihilar mass on PET scan, small right pleural effusion with some postobstructive collapse of the airway 07/30/2024-cytology well-differentiated neuroendocrine  tumor 08/15/2024-CT negative for PE, postobstructive pneumonia large right hilar mass, pleural effusion  Interim History / Subjective:  Cough, shortness of breath, fever with sputum production Just feels very weak, decreased intake  Objective    Blood pressure (!) 156/97, pulse 91, temperature 98 F (36.7 C), temperature source Oral, resp. rate (!) 23, SpO2 92%.       No intake or output data in the 24 hours ending 08/15/24 1427 There were no vitals filed for this visit.  Examination: General: Elderly, acute on chronically ill-appearing HENT: Moist oral mucosa Lungs: Decreased air movement at the bases bilaterally Cardiovascular: S1-S2 appreciated Abdomen: Soft, bowel sounds appreciated Extremities: No clubbing, left lower extremity swelling Neuro: Awake alert moving all extremities GU:   Lab data reviewed Chest x-ray reviewed CT scan reviewed Recent path results reviewed  Resolved problem list   Assessment and Plan   Patient with a history of neuroendocrine tumor with postobstructive pneumonia came in with cough, worsening shortness of breath Has a history of obstructive lung disease Followed by oncology-to start chemoradiation treatments  Multilobar postobstructive pneumonia - Started on antibiotics cefepime and vancomycin - Will continue the same at present - Follow-up on cultures - Sputum and blood cultures - Follow MRSA PCR  Chronic obstructive pulmonary disease - Bronchodilator treatments - Triple neb therapy with Yupelri Brovana Pulmicort  Hypoxic respiratory failure - Continue oxygen segmentation  Chronic kidney disease stage III  Type 2 diabetes - SSI  Hypertension - Resume home medications  Discussed with spouse at bedside  Labs   CBC: Recent Labs  Lab 08/15/24 1242 08/15/24 1256  WBC 12.2*  --  NEUTROABS 10.8*  --   HGB 13.1 13.3  HCT 39.5 39.0  MCV 87.6  --   PLT 218  --     Basic Metabolic Panel: Recent Labs  Lab  08/15/24 1242 08/15/24 1256  NA 136 137  K 4.0 3.8  CL 102 102  CO2 23  --   GLUCOSE 168* 166*  BUN 19 19  CREATININE 1.10 1.10  CALCIUM  8.8*  --    GFR: Estimated Creatinine Clearance: 51.8 mL/min (by C-G formula based on SCr of 1.1 mg/dL). Recent Labs  Lab 08/15/24 1242 08/15/24 1245  WBC 12.2*  --   LATICACIDVEN  --  1.1    Liver Function Tests: Recent Labs  Lab 08/15/24 1242  AST 10*  ALT 10  ALKPHOS 68  BILITOT 0.6  PROT 6.0*  ALBUMIN 2.9*   No results for input(s): LIPASE, AMYLASE in the last 168 hours. No results for input(s): AMMONIA in the last 168 hours.  ABG    Component Value Date/Time   TCO2 24 08/15/2024 1256     Coagulation Profile: No results for input(s): INR, PROTIME in the last 168 hours.  Cardiac Enzymes: No results for input(s): CKTOTAL, CKMB, CKMBINDEX, TROPONINI in the last 168 hours.  HbA1C: Hgb A1c MFr Bld  Date/Time Value Ref Range Status  06/21/2024 02:37 PM 7.0 (H) 4.6 - 6.5 % Final    Comment:    Glycemic Control Guidelines for People with Diabetes:Non Diabetic:  <6%Goal of Therapy: <7%Additional Action Suggested:  >8%   03/12/2024 10:58 AM 7.0 (H) 4.6 - 6.5 % Final    Comment:    Glycemic Control Guidelines for People with Diabetes:Non Diabetic:  <6%Goal of Therapy: <7%Additional Action Suggested:  >8%     CBG: No results for input(s): GLUCAP in the last 168 hours.  Review of Systems:   Decreased appetite, fever, fatigue  Past Medical History:  He,  has a past medical history of BPH (benign prostatic hyperplasia), Cataract, CKD (chronic kidney disease) stage 3, GFR 30-59 ml/min (HCC), DM (diabetes mellitus), type 2 (HCC), ED (erectile dysfunction), GERD (gastroesophageal reflux disease), History of adenomatous polyp of colon (11/02/2017), History of kidney stones, Hyperlipidemia, Hypertension, IBS (irritable bowel syndrome), Kidney stones (02/11/2020), LBBB (left bundle branch block), Lung nodule,  Personal history of kidney stones, and Vitamin D  deficiency.   Surgical History:   Past Surgical History:  Procedure Laterality Date   CATARACT EXTRACTION Right 2015   CATARACT EXTRACTION W/ INTRAOCULAR LENS IMPLANT  2013   left   COLONOSCOPY  2013   TA   ENDOBRONCHIAL ULTRASOUND Bilateral 07/30/2024   Procedure: ENDOBRONCHIAL ULTRASOUND (EBUS);  Surgeon: Isadora Hose, MD;  Location: [facility] ORS;  Service: Pulmonary;  Laterality: Bilateral;   EXTRACORPOREAL SHOCK WAVE LITHOTRIPSY Left 12/11/2017   Procedure: LEFT EXTRACORPOREAL SHOCK WAVE LITHOTRIPSY (ESWL);  Surgeon: Carolee Sherwood JONETTA DOUGLAS, MD;  Location: [facility] OR[facility];  Service: Urology;  Laterality: Left;   KIDNEY STONE SURGERY     POLYPECTOMY     VIDEO BRONCHOSCOPY WITH ENDOBRONCHIAL ULTRASOUND Bilateral 07/18/2024   Procedure: BRONCHOSCOPY, WITH EBUS;  Surgeon: Catherine Cools, MD;  Location: [facility];  Service: Pulmonary;  Laterality: Bilateral;     Social History:   reports that he quit smoking about 56 years ago. His smoking use included cigarettes. He started smoking about 66 years ago. He has a 5 pack-year smoking history. He has been exposed to tobacco smoke. He has never used smokeless tobacco. He reports that he does not drink alcohol  and  does not use drugs.   Family History:  His family history includes Diabetes in his sister and sister; Heart disease in his mother; Hypertension in his sister and sister. There is no history of Colon cancer, Colon polyps, Esophageal cancer, Rectal cancer, or Stomach cancer.   Allergies Allergies  Allergen Reactions   Bystolic [Nebivolol Hcl]     Jennet Epley, MD [place] [facility] Pager: See Tracey

## 2024-08-15 NOTE — Telephone Encounter (Signed)
 Noted, f/u as planned w oncology

## 2024-08-15 NOTE — Progress Notes (Signed)
 ED Pharmacy Antibiotic Sign Off An antibiotic consult was received from an ED provider for vanc/cefepime per pharmacy dosing for PNA. A chart review was completed to assess appropriateness.   The following one time order(s) were placed:  Vanc 1500mg   Cefepime 2g  Further antibiotic and/or antibiotic pharmacy consults should be ordered by the admitting provider if indicated.   Thank you for allowing pharmacy to be a part of this patient's care.   Britta Eva Na, [facility]  Clinical Pharmacist 08/15/24 2:31 PM

## 2024-08-15 NOTE — Telephone Encounter (Signed)
 FYI Only or Action Required?: FYI only for provider: ED advised.  Patient was last seen in primary care on 06/27/2024 by Alvia Corean CROME, FNP.  Called Nurse Triage reporting No chief complaint on file..  Symptoms began several months ago.  Interventions attempted: Nothing.  Symptoms are: gradually worsening.  Triage Disposition: Go to ED Now (Notify PCP)  Patient/caregiver understands and will follow disposition?: Yes  Copied from CRM #[id number]. Topic: Clinical - Red Word Triage >> Aug 15, 2024 10:05 AM Isabell A wrote: Red Word that prompted transfer to Nurse Triage: Patient is having trouble breathing.    Reason for Disposition  [1] MODERATE difficulty breathing (e.g., speaks in phrases, SOB even at rest, pulse 100-120) AND [2] NEW-onset or WORSE than normal  Answer Assessment - Initial Assessment Questions Patient saw doctor yesterday and was told he had a collapsed lung. Today, called 911. I spoke to a relative for a moment who then put me on the phone with the paramedic. They stated patient pulse ox at 95% but not hearing much movement in collapsed lung at all. Pulse between 40-60 and irregular. Paramedic stated he has an appointment today with doctor and the patient wants to go to that appointment. Paramedic states patient appears uncomfortable, weak and has SOB even while resting. Advised ED. Paramedic will talk to family and see what they want to do.   1. RESPIRATORY STATUS: Describe your breathing? (e.g., wheezing, shortness of breath, unable to speak, severe coughing)      SOB even at rest.  6. CARDIAC HISTORY: Do you have any history of heart disease? (e.g., heart attack, angina, bypass surgery, angioplasty)      HTN 7. LUNG HISTORY: Do you have any history of lung disease?  (e.g., pulmonary embolus, asthma, emphysema)     Lung cancer, collapsed lung 8. CAUSE: What do you think is causing the breathing problem?      Patient has lung cancer and collapsed  lung. 10. O2 SATURATION MONITOR:  Do you use an oxygen saturation monitor (pulse oximeter) at home? If Yes, ask: What is your reading (oxygen level) today? What is your usual oxygen saturation reading? (e.g., 95%)       95%  Answer Assessment - Initial Assessment Questions 1. DESCRIPTION: Describe how you are feeling.     SOB,  2. SEVERITY: How bad is it?  Can you stand and walk?     *No Answer* 3. ONSET: When did these symptoms begin? (e.g., hours, days, weeks, months)     *No Answer* 4. CAUSE: What do you think is causing the weakness or fatigue? (e.g., not drinking enough fluids, medical problem, trouble sleeping)     Lung cancel, lung collapse 5. NEW MEDICINES:  Have you started on any new medicines recently? (e.g., opioid pain medicines, benzodiazepines, muscle relaxants, antidepressants, antihistamines, neuroleptics, beta blockers)     *No Answer* 6. OTHER SYMPTOMS: Do you have any other symptoms? (e.g., chest pain, fever, cough, SOB, vomiting, diarrhea, bleeding, other areas of pain)     *No Answer* 7. PREGNANCY: Is there any chance you are pregnant? When was your last menstrual period?     *No Answer*  Protocols used: Weakness (Generalized) and Fatigue-A-AH, Breathing Difficulty-A-AH

## 2024-08-16 ENCOUNTER — Other Ambulatory Visit: Payer: Self-pay

## 2024-08-16 ENCOUNTER — Other Ambulatory Visit (HOSPITAL_BASED_OUTPATIENT_CLINIC_OR_DEPARTMENT_OTHER): Payer: Self-pay

## 2024-08-16 ENCOUNTER — Ambulatory Visit

## 2024-08-16 ENCOUNTER — Ambulatory Visit: Admitting: Radiation Oncology

## 2024-08-16 ENCOUNTER — Other Ambulatory Visit (HOSPITAL_COMMUNITY): Payer: Self-pay

## 2024-08-16 DIAGNOSIS — J9601 Acute respiratory failure with hypoxia: Secondary | ICD-10-CM | POA: Diagnosis not present

## 2024-08-16 DIAGNOSIS — J188 Other pneumonia, unspecified organism: Secondary | ICD-10-CM | POA: Diagnosis not present

## 2024-08-16 DIAGNOSIS — R0602 Shortness of breath: Secondary | ICD-10-CM

## 2024-08-16 DIAGNOSIS — C7A8 Other malignant neuroendocrine tumors: Secondary | ICD-10-CM | POA: Diagnosis not present

## 2024-08-16 DIAGNOSIS — C3431 Malignant neoplasm of lower lobe, right bronchus or lung: Secondary | ICD-10-CM | POA: Insufficient documentation

## 2024-08-16 DIAGNOSIS — Z510 Encounter for antineoplastic radiation therapy: Secondary | ICD-10-CM | POA: Insufficient documentation

## 2024-08-16 LAB — MAGNESIUM: Magnesium: 1.7 mg/dL (ref 1.7–2.4)

## 2024-08-16 LAB — CBC
HCT: 35.3 % — ABNORMAL LOW (ref 39.0–52.0)
Hemoglobin: 11.8 g/dL — ABNORMAL LOW (ref 13.0–17.0)
MCH: 29.4 pg (ref 26.0–34.0)
MCHC: 33.4 g/dL (ref 30.0–36.0)
MCV: 88 fL (ref 80.0–100.0)
Platelets: 229 K/uL (ref 150–400)
RBC: 4.01 MIL/uL — ABNORMAL LOW (ref 4.22–5.81)
RDW: 13.9 % (ref 11.5–15.5)
WBC: 11.6 K/uL — ABNORMAL HIGH (ref 4.0–10.5)
nRBC: 0 % (ref 0.0–0.2)

## 2024-08-16 LAB — BASIC METABOLIC PANEL WITH GFR
Anion gap: 10 (ref 5–15)
BUN: 18 mg/dL (ref 8–23)
CO2: 23 mmol/L (ref 22–32)
Calcium: 8.7 mg/dL — ABNORMAL LOW (ref 8.9–10.3)
Chloride: 101 mmol/L (ref 98–111)
Creatinine, Ser: 1.21 mg/dL (ref 0.61–1.24)
GFR, Estimated: 60 mL/min — ABNORMAL LOW (ref >60)
Glucose, Bld: 196 mg/dL — ABNORMAL HIGH (ref 70–99)
Potassium: 3.7 mmol/L (ref 3.5–5.1)
Sodium: 134 mmol/L — ABNORMAL LOW (ref 135–145)

## 2024-08-16 LAB — PHOSPHORUS: Phosphorus: 2.6 mg/dL (ref 2.5–4.6)

## 2024-08-16 LAB — RAD ONC ARIA SESSION SUMMARY
Course Elapsed Days: 0
Plan Fractions Treated to Date: 1
Plan Prescribed Dose Per Fraction: 2 Gy
Plan Total Fractions Prescribed: 30
Plan Total Prescribed Dose: 60 Gy
Reference Point Dosage Given to Date: 2 Gy
Reference Point Session Dosage Given: 2 Gy
Session Number: 1

## 2024-08-16 LAB — GLUCOSE, CAPILLARY
Glucose-Capillary: 136 mg/dL — ABNORMAL HIGH (ref 70–99)
Glucose-Capillary: 182 mg/dL — ABNORMAL HIGH (ref 70–99)
Glucose-Capillary: 145 mg/dL — ABNORMAL HIGH (ref 70–99)
Glucose-Capillary: 190 mg/dL — ABNORMAL HIGH (ref 70–99)

## 2024-08-16 MED ORDER — GUAIFENESIN-DM 100-10 MG/5ML PO SYRP
10.0000 mL | ORAL_SOLUTION | Freq: Three times a day (TID) | ORAL | Status: AC
  Administered 2024-08-16 – 2024-08-18 (×9): 10 mL via ORAL
  Filled 2024-08-16 (×9): qty 10

## 2024-08-16 MED ORDER — POLYVINYL ALCOHOL 1.4 % OP SOLN
1.0000 [drp] | OPHTHALMIC | Status: DC | PRN
  Administered 2024-08-16: 1 [drp] via OPHTHALMIC
  Filled 2024-08-16: qty 15

## 2024-08-16 MED ORDER — MAGNESIUM SULFATE 2 GM/50ML IV SOLN
2.0000 g | Freq: Once | INTRAVENOUS | Status: AC
  Administered 2024-08-16: 2 g via INTRAVENOUS
  Filled 2024-08-16: qty 50

## 2024-08-16 MED ORDER — POTASSIUM CHLORIDE CRYS ER 20 MEQ PO TBCR
40.0000 meq | EXTENDED_RELEASE_TABLET | Freq: Once | ORAL | Status: AC
  Administered 2024-08-16: 40 meq via ORAL
  Filled 2024-08-16: qty 2

## 2024-08-16 NOTE — Progress Notes (Signed)
 NAME:  Edward [name], MRN:  [id number], DOB:  Aug 09, 1941, LOS: 1 ADMISSION DATE:  08/15/2024, CONSULTATION DATE: 08/15/2024 REFERRING MD: Dr. Laurice, CHIEF COMPLAINT: Postobstructive pneumonia  History of Present Illness:  Patient with a recent diagnosis of well-differentiated neuroendocrine tumor, was seen in pulmonary clinic yesterday with complaints of shortness of breath, cough, fevers.  Started on Levaquin .  Worsening symptoms led to him presenting to the emergency department with increased shortness of breath Diagnosed with neuroendocrine tumor 07/30/2024-had bronchoscopy following evaluation for lung mass   Plan for radiation and chemotherapy-follows with oncology   A week history of fever cough shortness of breath Was seen at urgent care, was given cough medicine and a shot of steroids that did not help Past smoking history, quit in the 70s  Pertinent  Medical History   Past Medical History:  Diagnosis Date   BPH (benign prostatic hyperplasia)    Cataract    surgical removal bilateral   CKD (chronic kidney disease) stage 3, GFR 30-59 ml/min (HCC)    DM (diabetes mellitus), type 2 (HCC)    ED (erectile dysfunction)    GERD (gastroesophageal reflux disease)    History of adenomatous polyp of colon 11/02/2017   Tubular adenoma 2013; recommended 5 year follow up   History of kidney stones    Hyperlipidemia    Hypertension    IBS (irritable bowel syndrome)    Kidney stones 02/11/2020   LBBB (left bundle branch block)    Lung nodule    Personal history of kidney stones    Vitamin D  deficiency    Significant Hospital Events: Including procedures, antibiotic start and stop dates in addition to other pertinent events   07/02/2024 CT chest-large lobular mass inferior right hilum 07/22/2024 hypermetabolic right perihilar mass on PET scan, small right pleural effusion with some postobstructive collapse of the airway 07/30/2024-cytology well-differentiated neuroendocrine  tumor 08/15/2024-CT negative for PE, postobstructive pneumonia large right hilar mass, pleural effusion  Interim History / Subjective:  Coughing, shortness of breath Does not feel like eating much  Objective    Blood pressure 129/66, pulse (!) 44, temperature 98 F (36.7 C), temperature source Oral, resp. rate 18, height 5' 9 (1.753 m), weight 71.3 kg, SpO2 95%.        Intake/Output Summary (Last 24 hours) at 08/16/2024 1004 Last data filed at 08/16/2024 0445 Gross per 24 hour  Intake 220 ml  Output 1000 ml  Net -780 ml   Filed Weights   08/15/24 1647  Weight: 71.3 kg    Examination: General: Elderly, chronically ill-appearing HENT: Moist oral mucosa Lungs: Decreased air movement at the bases bilaterally Cardiovascular: S1-S2 appreciated Abdomen: Soft, bowel sounds appreciated Extremities: No clubbing, no edema Neuro: Awake alert moving all extremities GU:   I reviewed last 24 h vitals and pain scores, last 48 h intake and output, last 24 h labs and trends, and last 24 h imaging results.  CT scan reviewed  Resolved problem list   Assessment and Plan   Patient with a history of neuroendocrine tumor with postobstructive pneumonia came in with cough, worsening shortness of breath Recent bronchoscopy He follows with oncology to start chemoradiation at some point  Multilobar postobstructive pneumonia On cefepime , vancomycin  - Follow-up on cultures - Follow-up on sputum and blood cultures - MRSA PCR negative  Chronic obstructive pulmonary disease - Continue bronchodilator treatments - Triple neb therapy Yupelri , Brovana , Pulmicort   Hypoxic respiratory failure Continue oxygen supplementation  Chronic kidney disease stage III  Type 2  diabetes - Continue SSI  Hypertension - Home medications  Added Robitussin with dextromethorphan 

## 2024-08-16 NOTE — Progress Notes (Signed)
   08/16/24 1702  Spiritual Encounters  Type of Visit Initial  Care provided to: Family  Referral source Chaplain assessment  Reason for visit Routine spiritual support   I met Mrs Edward [name] in hall and offered support regarding options since cafeteria closed.  [name] shared events that brought her and her husband into hospital. Shared they have support through their pastor and church near [place]. She also reflected on her working life in [place] [employer].  I provided compassionate presence and established a relationship of care. I invited reflection and story sharing and opportunity to process these current challenges. I encouraged [name] in her self-care and wished her husband much healing and offered my prayers going forward.  [name] L. Delores HERO.Div

## 2024-08-16 NOTE — Hospital Course (Addendum)
 83 year old man recent diagnosis well-differentiated neuroendocrine tumor, seen by pulmonary in the office 12/4 and sent to the hospital for admission for postobstructive pneumonia, shortness of breath.  Consultants Pulmonology  Cardiology   Procedures/Events 12/4 admission

## 2024-08-16 NOTE — Plan of Care (Signed)
  Problem: Health Behavior/Discharge Planning: Goal: Ability to identify and utilize available resources and services will improve Outcome: Progressing Goal: Ability to manage health-related needs will improve Outcome: Progressing   Problem: Fluid Volume: Goal: Ability to maintain a balanced intake and output will improve Outcome: Progressing

## 2024-08-16 NOTE — Progress Notes (Addendum)
  Progress Note   Patient: Edward [name] FMW:[id number] DOB: 06-Feb-1941 DOA: 08/15/2024     1 DOS: the patient was seen and examined on 08/16/2024   Brief hospital course: 83 year old man recent diagnosis well-differentiated neuroendocrine tumor, seen by pulmonary in the office 12/4 and sent to the hospital for admission for postobstructive pneumonia, shortness of breath.  Consultants Pulmonology   Procedures/Events 12/4 admission  Assessment and Plan: Multilobar postobstructive pneumonia on the right Small cell right-sided lung cancer, well-differentiated neuroendocrine tumor  Acute hypoxic respiratory failure Continue empiric antibiotics, follow-up culture data.  Appreciate pulmonology. Follow-up with oncology as an outpatient  COPD Appears stable.  Continue triple nebulizer therapy  CKD stage III questioned  Diabetes mellitus type 2 CBG stable.  Sliding scale insulin .  Hold metformin .  Elevated troponin Elevated BNP Given reflux of contrast into hepatic veins suggesting cardiac dysfunction, will check echocardiogram    Subjective:  Coughing more today, somewhat short of breath.  Some lower abdominal pain he thinks is from hunger.  Physical Exam: Vitals:   08/16/24 0441 08/16/24 0749 08/16/24 0850 08/16/24 1213  BP: (!) 114/54  129/66 117/67  Pulse: 72  (!) 44 80  Resp: (!) 24  18 20   Temp: 98.9 F (37.2 C)  98 F (36.7 C) 98.3 F (36.8 C)  TempSrc: Oral  Oral Oral  SpO2: 98% 97% 95% 97%  Weight:      Height:       Physical Exam Vitals reviewed.  Constitutional:      General: He is not in acute distress.    Appearance: He is ill-appearing. He is not toxic-appearing.  Cardiovascular:     Rate and Rhythm: Normal rate and regular rhythm.     Heart sounds: No murmur heard. Pulmonary:     Effort: No respiratory distress.     Breath sounds: No wheezing, rhonchi or rales.     Comments: Mild increased respiratory effort Neurological:     Mental Status: He  is alert.  Psychiatric:        Mood and Affect: Mood normal.        Behavior: Behavior normal.     Data Reviewed: CBG stable WBC stable 11.6 BMP unremarkable Magnesium  and phosphorus within normal limits Venous ultrasound noted, no lower extremity DVT  Family Communication: wife at bedside  Disposition: Status is: Inpatient Remains inpatient appropriate because: pneumonia     Time spent: 35 minutes  Author: Toribio Door, MD 08/16/2024 4:59 PM  For on call review [email or web address].

## 2024-08-17 ENCOUNTER — Other Ambulatory Visit (HOSPITAL_COMMUNITY): Payer: Self-pay

## 2024-08-17 ENCOUNTER — Inpatient Hospital Stay (HOSPITAL_COMMUNITY)

## 2024-08-17 ENCOUNTER — Other Ambulatory Visit: Payer: Self-pay

## 2024-08-17 DIAGNOSIS — C7A090 Malignant carcinoid tumor of the bronchus and lung: Secondary | ICD-10-CM | POA: Insufficient documentation

## 2024-08-17 DIAGNOSIS — Z5111 Encounter for antineoplastic chemotherapy: Secondary | ICD-10-CM | POA: Insufficient documentation

## 2024-08-17 DIAGNOSIS — C7A8 Other malignant neuroendocrine tumors: Secondary | ICD-10-CM | POA: Diagnosis not present

## 2024-08-17 DIAGNOSIS — Z510 Encounter for antineoplastic radiation therapy: Secondary | ICD-10-CM | POA: Insufficient documentation

## 2024-08-17 DIAGNOSIS — C7A1 Malignant poorly differentiated neuroendocrine tumors: Secondary | ICD-10-CM | POA: Insufficient documentation

## 2024-08-17 DIAGNOSIS — Z79899 Other long term (current) drug therapy: Secondary | ICD-10-CM | POA: Insufficient documentation

## 2024-08-17 DIAGNOSIS — R112 Nausea with vomiting, unspecified: Secondary | ICD-10-CM | POA: Insufficient documentation

## 2024-08-17 DIAGNOSIS — J188 Other pneumonia, unspecified organism: Secondary | ICD-10-CM | POA: Diagnosis not present

## 2024-08-17 DIAGNOSIS — N183 Chronic kidney disease, stage 3 unspecified: Secondary | ICD-10-CM

## 2024-08-17 DIAGNOSIS — J9601 Acute respiratory failure with hypoxia: Secondary | ICD-10-CM | POA: Diagnosis not present

## 2024-08-17 DIAGNOSIS — E1122 Type 2 diabetes mellitus with diabetic chronic kidney disease: Secondary | ICD-10-CM | POA: Diagnosis not present

## 2024-08-17 DIAGNOSIS — T451X5A Adverse effect of antineoplastic and immunosuppressive drugs, initial encounter: Secondary | ICD-10-CM | POA: Insufficient documentation

## 2024-08-17 LAB — BASIC METABOLIC PANEL WITH GFR
Anion gap: 8 (ref 5–15)
BUN: 17 mg/dL (ref 8–23)
CO2: 25 mmol/L (ref 22–32)
Calcium: 8.6 mg/dL — ABNORMAL LOW (ref 8.9–10.3)
Chloride: 102 mmol/L (ref 98–111)
Creatinine, Ser: 1.18 mg/dL (ref 0.61–1.24)
GFR, Estimated: >60 mL/min (ref >60)
Glucose, Bld: 176 mg/dL — ABNORMAL HIGH (ref 70–99)
Potassium: 3.9 mmol/L (ref 3.5–5.1)
Sodium: 135 mmol/L (ref 135–145)

## 2024-08-17 LAB — ECHOCARDIOGRAM COMPLETE
Area-P 1/2: 2.86 cm2
Calc EF: 37.2 %
Height: 69 in
S' Lateral: 4.3 cm
Single Plane A2C EF: 39 %
Single Plane A4C EF: 29.6 %
Weight: 2515.01 [oz_av]

## 2024-08-17 LAB — CALCIUM, IONIZED: Calcium, Ionized, Serum: 5.2 mg/dL (ref 4.5–5.6)

## 2024-08-17 LAB — MAGNESIUM: Magnesium: 2.2 mg/dL (ref 1.7–2.4)

## 2024-08-17 LAB — GLUCOSE, CAPILLARY
Glucose-Capillary: 163 mg/dL — ABNORMAL HIGH (ref 70–99)
Glucose-Capillary: 149 mg/dL — ABNORMAL HIGH (ref 70–99)
Glucose-Capillary: 195 mg/dL — ABNORMAL HIGH (ref 70–99)
Glucose-Capillary: 157 mg/dL — ABNORMAL HIGH (ref 70–99)

## 2024-08-17 LAB — PHOSPHORUS: Phosphorus: 2.7 mg/dL (ref 2.5–4.6)

## 2024-08-17 MED ORDER — POTASSIUM CHLORIDE CRYS ER 20 MEQ PO TBCR
40.0000 meq | EXTENDED_RELEASE_TABLET | Freq: Once | ORAL | Status: AC
  Administered 2024-08-17: 40 meq via ORAL
  Filled 2024-08-17: qty 2

## 2024-08-17 MED ORDER — METOPROLOL TARTRATE 25 MG PO TABS: 12.5000 mg | ORAL_TABLET | Freq: Two times a day (BID) | ORAL | Status: DC

## 2024-08-17 MED ORDER — METOPROLOL TARTRATE 25 MG PO TABS
12.5000 mg | ORAL_TABLET | Freq: Two times a day (BID) | ORAL | Status: DC
  Administered 2024-08-17 – 2024-08-18 (×2): 12.5 mg via ORAL
  Filled 2024-08-17 (×2): qty 1

## 2024-08-17 MED ORDER — PERFLUTREN LIPID MICROSPHERE
1.0000 mL | INTRAVENOUS | Status: AC | PRN
  Administered 2024-08-17: 2 mL via INTRAVENOUS

## 2024-08-17 MED ORDER — OXYCODONE HCL 5 MG PO TABS: 5.0000 mg | ORAL_TABLET | Freq: Four times a day (QID) | ORAL | Status: DC | PRN

## 2024-08-17 NOTE — Progress Notes (Signed)
 NAME:  Edward [name], MRN:  [id number], DOB:  1940-10-13, LOS: 2 ADMISSION DATE:  08/15/2024, CONSULTATION DATE: 08/15/2024 REFERRING MD: Dr. Laurice, CHIEF COMPLAINT: Postobstructive pneumonia  History of Present Illness:  Patient with a recent diagnosis of well-differentiated neuroendocrine tumor, was seen in pulmonary clinic yesterday with complaints of shortness of breath, cough, fevers.  Started on Levaquin .  Worsening symptoms led to him presenting to the emergency department with increased shortness of breath Diagnosed with neuroendocrine tumor 07/30/2024-had bronchoscopy following evaluation for lung mass   Plan for radiation and chemotherapy-follows with oncology   A week history of fever cough shortness of breath Was seen at urgent care, was given cough medicine and a shot of steroids that did not help Past smoking history, quit in the 70s  Pertinent  Medical History   Past Medical History:  Diagnosis Date   BPH (benign prostatic hyperplasia)    Cataract    surgical removal bilateral   CKD (chronic kidney disease) stage 3, GFR 30-59 ml/min (HCC)    DM (diabetes mellitus), type 2 (HCC)    ED (erectile dysfunction)    GERD (gastroesophageal reflux disease)    History of adenomatous polyp of colon 11/02/2017   Tubular adenoma 2013; recommended 5 year follow up   History of kidney stones    Hyperlipidemia    Hypertension    IBS (irritable bowel syndrome)    Kidney stones 02/11/2020   LBBB (left bundle branch block)    Lung nodule    Personal history of kidney stones    Vitamin D  deficiency    Significant Hospital Events: Including procedures, antibiotic start and stop dates in addition to other pertinent events   07/02/2024 CT chest-large lobular mass inferior right hilum 07/22/2024 hypermetabolic right perihilar mass on PET scan, small right pleural effusion with some postobstructive collapse of the airway 07/30/2024-cytology well-differentiated neuroendocrine  tumor 08/15/2024-CT negative for PE, postobstructive pneumonia large right hilar mass, pleural effusion  Interim History / Subjective:  Less coughing Feels a little bit better Objective    Blood pressure 110/70, pulse 70, temperature 98.1 F (36.7 C), temperature source Oral, resp. rate 18, height 5' 9 (1.753 m), weight 71.3 kg, SpO2 95%.        Intake/Output Summary (Last 24 hours) at 08/17/2024 1217 Last data filed at 08/17/2024 1100 Gross per 24 hour  Intake 667.19 ml  Output 1000 ml  Net -332.81 ml   Filed Weights   08/15/24 1647  Weight: 71.3 kg    Examination: General: Elderly, chronically ill-appearing HENT: Moist oral mucosa Lungs: Decreased air movement bilaterally Cardiovascular: S1-S2 appreciated Abdomen: Soft, bowel sounds appreciated Extremities: No clubbing, no edema Neuro: Awake alert moving all extremities GU:   I reviewed last 24 h vitals and pain scores, last 48 h intake and output, last 24 h labs and trends, and last 24 h imaging results.  Resolved problem list   Assessment and Plan   Patient with a history of neuroendocrine tumor with postobstructive pneumonia came in with cough, worsening shortness of breath He had a recent bronchoscopy Follows with oncology to start chemoradiation at some point  Multilobar postobstructive pneumonia appears to be feeling better - Currently on cefepime , MRSA PCR negative - Blood cultures negative -Was not really able to cough up any secretions to be tested - If no fever and continues to improve, may consider switching to Augmentin  from 08/18/2024 - Because the obstruction is still there, we will need to consider keeping him on antibiotics for  few weeks, probably 4 weeks of Augmentin   Chronic obstructive pulmonary disease - Continue triple neb therapy with Yupelri , Brovana , Pulmicort   Hypoxic respiratory failure - Continue oxygen supplementation  Chronic kidney disease stage III  Type 2 diabetes - Continue  SSI  Hypertension - Continue home medications  Continue antitussives

## 2024-08-17 NOTE — Progress Notes (Signed)
  Progress Note   Patient: Edward [name] FMW:[id number] DOB: 04-17-1941 DOA: 08/15/2024     2 DOS: the patient was seen and examined on 08/17/2024   Brief hospital course: 83 year old man recent diagnosis well-differentiated neuroendocrine tumor, seen by pulmonary in the office 12/4 and sent to the hospital for admission for postobstructive pneumonia, shortness of breath.  Consultants Pulmonology   Procedures/Events 12/4 admission  Assessment and Plan: Multilobar postobstructive pneumonia on the right Small cell right-sided lung cancer, well-differentiated neuroendocrine tumor Acute hypoxic respiratory failure Somewhat better today.  Continue empiric antibiotics, follow-up culture data.  Appreciate pulmonology. Follow-up with oncology as an outpatient  Frequent PVCs NSVT Electrolytes stable.  Etiology unclear.  Intolerance to beta-blocker noted.  Monitor clinically.  Cardiology consult tomorrow.   COPD Remains stable.  Continue triple nebulizer therapy   CKD stage IIIa ruled out   Diabetes mellitus type 2 CBG remains stable.  Continue sliding scale insulin .  Hold metformin .   Elevated troponin Elevated BNP Given reflux of contrast into hepatic veins suggesting cardiac dysfunction, will check echocardiogram      Subjective:  Feels a bit better today. Frequent PVCs and several episodes of NSVT  Physical Exam: Vitals:   08/17/24 0508 08/17/24 0744 08/17/24 0745 08/17/24 0746  BP: 110/70     Pulse: 70     Resp: 18     Temp: 98.1 F (36.7 C)     TempSrc: Oral     SpO2: 98% 95% 95% 95%  Weight:      Height:       Physical Exam Vitals reviewed.  Constitutional:      General: He is not in acute distress.    Appearance: He is ill-appearing. He is not toxic-appearing.  Cardiovascular:     Rate and Rhythm: Normal rate and regular rhythm.     Heart sounds: No murmur heard. Pulmonary:     Effort: No respiratory distress.     Breath sounds: No wheezing, rhonchi  or rales.     Comments: Mild increased respiratory effort Neurological:     Mental Status: He is alert.  Psychiatric:        Mood and Affect: Mood normal.        Behavior: Behavior normal.     Data Reviewed: CBG stable Basic metabolic panel unremarkable, phosphorus and magnesium  within normal limits  Family Communication: none  Disposition: Status is: Inpatient Remains inpatient appropriate because: pneumonia     Time spent: 35 minutes  Author: Toribio Door, MD 08/17/2024 8:41 AM  For on call review [email or web address].

## 2024-08-17 NOTE — Progress Notes (Signed)
  Echocardiogram 2D Echocardiogram has been performed.  Tinnie FORBES Gosling RDCS 08/17/2024, 9:57 AM

## 2024-08-17 NOTE — Plan of Care (Signed)
  Problem: Education: Goal: Ability to describe self-care measures that may prevent or decrease complications (Diabetes Survival Skills Education) will improve Outcome: Progressing   Problem: Fluid Volume: Goal: Ability to maintain a balanced intake and output will improve Outcome: Progressing   Problem: Health Behavior/Discharge Planning: Goal: Ability to identify and utilize available resources and services will improve Outcome: Progressing   Problem: Metabolic: Goal: Ability to maintain appropriate glucose levels will improve Outcome: Progressing   Problem: Nutritional: Goal: Maintenance of adequate nutrition will improve Outcome: Progressing   Problem: Skin Integrity: Goal: Risk for impaired skin integrity will decrease Outcome: Progressing   Problem: Clinical Measurements: Goal: Ability to maintain clinical measurements within normal limits will improve Outcome: Progressing Goal: Diagnostic test results will improve Outcome: Progressing Goal: Respiratory complications will improve Outcome: Progressing   Problem: Coping: Goal: Ability to adjust to condition or change in health will improve Outcome: Not Progressing

## 2024-08-18 DIAGNOSIS — I493 Ventricular premature depolarization: Secondary | ICD-10-CM | POA: Diagnosis not present

## 2024-08-18 DIAGNOSIS — I4719 Other supraventricular tachycardia: Secondary | ICD-10-CM

## 2024-08-18 DIAGNOSIS — J188 Other pneumonia, unspecified organism: Secondary | ICD-10-CM | POA: Diagnosis not present

## 2024-08-18 LAB — MAGNESIUM: Magnesium: 2.1 mg/dL (ref 1.7–2.4)

## 2024-08-18 LAB — BASIC METABOLIC PANEL WITH GFR
Anion gap: 8 (ref 5–15)
BUN: 15 mg/dL (ref 8–23)
CO2: 25 mmol/L (ref 22–32)
Calcium: 8.2 mg/dL — ABNORMAL LOW (ref 8.9–10.3)
Chloride: 103 mmol/L (ref 98–111)
Creatinine, Ser: 1.02 mg/dL (ref 0.61–1.24)
GFR, Estimated: >60 mL/min (ref >60)
Glucose, Bld: 150 mg/dL — ABNORMAL HIGH (ref 70–99)
Potassium: 4.1 mmol/L (ref 3.5–5.1)
Sodium: 136 mmol/L (ref 135–145)

## 2024-08-18 LAB — PHOSPHORUS: Phosphorus: 2.9 mg/dL (ref 2.5–4.6)

## 2024-08-18 LAB — GLUCOSE, CAPILLARY
Glucose-Capillary: 147 mg/dL — ABNORMAL HIGH (ref 70–99)
Glucose-Capillary: 155 mg/dL — ABNORMAL HIGH (ref 70–99)
Glucose-Capillary: 176 mg/dL — ABNORMAL HIGH (ref 70–99)
Glucose-Capillary: 163 mg/dL — ABNORMAL HIGH (ref 70–99)

## 2024-08-18 MED ORDER — METOPROLOL TARTRATE 25 MG PO TABS
25.0000 mg | ORAL_TABLET | Freq: Two times a day (BID) | ORAL | Status: DC
  Administered 2024-08-19: 25 mg via ORAL
  Filled 2024-08-18 (×2): qty 1

## 2024-08-18 MED ORDER — AMOXICILLIN-POT CLAVULANATE 875-125 MG PO TABS
1.0000 | ORAL_TABLET | Freq: Two times a day (BID) | ORAL | Status: DC
  Administered 2024-08-18 – 2024-08-19 (×3): 1 via ORAL
  Filled 2024-08-18 (×3): qty 1

## 2024-08-18 NOTE — Consult Note (Signed)
 Cardiology Consultation   Patient ID: NAASIR CARREIRA MRN: [id number]; DOB: Dec 30, 1940  Admit date: 08/15/2024 Date of Consult: 08/18/2024  PCP:  [name], NP-C   [facility] Providers Cardiologist:  None        Patient Profile: Edward [name] is a 83 y.o. male with a hx of neuroendocrine small cell lung CA and post obstructive PN who is being seen 08/18/2024 for the evaluation of NSVT/PVC's at the request of Dr. Jadine.  History of Present Illness: Mr. Hackman is a 83 yo man with the above problems who was admitted with worsening respiratory dysfunction and pneumonia and note to have PVC's and NSVT. He is still sob. He has been treated with emperic anti-biotics. No h/o syncope. He has not been febrile. No anginal symptoms.    Past Medical History:  Diagnosis Date   BPH (benign prostatic hyperplasia)    Cataract    surgical removal bilateral   CKD (chronic kidney disease) stage 3, GFR 30-59 ml/min (HCC)    DM (diabetes mellitus), type 2 (HCC)    ED (erectile dysfunction)    GERD (gastroesophageal reflux disease)    History of adenomatous polyp of colon 11/02/2017   Tubular adenoma 2013; recommended 5 year follow up   History of kidney stones    Hyperlipidemia    Hypertension    IBS (irritable bowel syndrome)    Kidney stones 02/11/2020   LBBB (left bundle branch block)    Lung nodule    Personal history of kidney stones    Vitamin D  deficiency     Past Surgical History:  Procedure Laterality Date   CATARACT EXTRACTION Right 2015   CATARACT EXTRACTION W/ INTRAOCULAR LENS IMPLANT  2013   left   COLONOSCOPY  2013   TA   ENDOBRONCHIAL ULTRASOUND Bilateral 07/30/2024   Procedure: ENDOBRONCHIAL ULTRASOUND (EBUS);  Surgeon: Isadora Hose, MD;  Location: [facility] ORS;  Service: Pulmonary;  Laterality: Bilateral;   EXTRACORPOREAL SHOCK WAVE LITHOTRIPSY Left 12/11/2017   Procedure: LEFT EXTRACORPOREAL SHOCK WAVE LITHOTRIPSY (ESWL);  Surgeon: Carolee Sherwood JONETTA DOUGLAS, MD;  Location: [facility];  Service: Urology;  Laterality: Left;   KIDNEY STONE SURGERY     POLYPECTOMY     VIDEO BRONCHOSCOPY WITH ENDOBRONCHIAL ULTRASOUND Bilateral 07/18/2024   Procedure: BRONCHOSCOPY, WITH EBUS;  Surgeon: Catherine Cools, MD;  Location: [facility];  Service: Pulmonary;  Laterality: Bilateral;     Home Medications:  Prior to Admission medications   Medication Sig Start Date End Date Taking? Authorizing Provider  APPLE CIDER VINEGAR PO Take 1-2 tablets by mouth daily.   Yes [provider]  ascorbic acid (VITAMIN C) 500 MG tablet Take 500-1,000 mg by mouth daily.   Yes [provider]  aspirin  81 MG tablet Take 81 mg by mouth 3 (three) times a week.   Yes [provider]  atorvastatin  (LIPITOR) 40 MG tablet Take 1 tablet (40 mg total) by mouth every evening for cholesterol 05/30/24  Yes   Cholecalciferol (VITAMIN D3) 125 MCG (5000 UT) CAPS Take 5,000 Units by mouth daily.   Yes [provider]  CINNAMON PO Take 1-2 capsules by mouth daily.   Yes [provider]  Cyanocobalamin (VITAMIN B 12 PO) Take 1 tablet by mouth daily.   Yes [provider]  famotidine  (PEPCID ) 40 MG tablet Take 1 tablet (40 mg total) by mouth every evening to prevent heartburn and indigestion. Patient taking differently: Take 40 mg by mouth See admin instructions. Take  40 mg by mouth in the evening as needed for reflux or indigestion 06/27/24  Yes Alvia Corean CROME, FNP  fluticasone  (FLONASE ) 50 MCG/ACT nasal spray Place 1-2 sprays into both nostrils daily. Patient taking differently: Place 1-2 sprays into both nostrils daily as needed for allergies or rhinitis. 06/27/24  Yes Alvia Corean CROME, FNP  levofloxacin  (LEVAQUIN ) 750 MG tablet Take 1 tablet (750 mg total) by mouth daily. 08/14/24  Yes Alghanim, Fahid, MD  losartan  (COZAAR ) 50 MG tablet Take 1 tablet (50 mg total) by mouth daily for blood pressure. 08/07/24  Yes Henson, Vickie L,  NP-C  metFORMIN  (GLUCOPHAGE ) 500 MG tablet Take 1 tablet (500 mg total) by mouth 3 (three) times daily with meals for diabetes. 06/27/24  Yes Alvia Corean CROME, FNP  OMEGA-3 FATTY ACIDS PO Take 1 capsule by mouth 3 (three) times a week.   Yes [provider]  pantoprazole  (PROTONIX ) 40 MG tablet Take 1 tablet (40 mg total) by mouth every evening to prevent heartburn and indigestion. Patient taking differently: Take 40 mg by mouth See admin instructions. Take 40 mg by mouth 30 minutes before breakfast as needed for reflux or indigestion 07/16/24  Yes Henson, Vickie L, NP-C  SYSTANE COMPLETE PF 0.6 % SOLN Place 1 drop into both eyes 3 (three) times daily as needed (for dryness or irritation).   Yes [provider]  tamsulosin  (FLOMAX ) 0.4 MG CAPS capsule Take 1 capsule (0.4 mg total) by mouth every evening for prostate. 06/27/24  Yes Alvia Corean CROME, FNP  tizanidine  (ZANAFLEX ) 2 MG capsule Take 1 capsule (2 mg total) by mouth 2 (two) times daily as needed for muscle spasms 06/27/24  Yes Alvia Corean CROME, FNP  zinc gluconate 50 MG tablet Take 50 mg by mouth daily.   Yes [provider]  albuterol  (ACCUNEB ) 1.25 MG/3ML nebulizer solution Inhale 3 mL (1.25 mg total) by nebulization every 6 (six) hours as needed for wheezing. Patient not taking: Reported on 08/15/2024 08/14/24   Catherine Cools, MD  albuterol  (VENTOLIN  HFA) 108 (90 Base) MCG/ACT inhaler Inhale 2 puffs into the lungs every 4 (four) hours as needed for wheezing or shortness of breath. Patient not taking: Reported on 08/15/2024 08/10/24   Iola Lukes, FNP  atorvastatin  (LIPITOR) 40 MG tablet Take 1 tablet (40 mg total) by mouth every evening for cholesterol. Patient not taking: Reported on 08/15/2024 06/27/24   Alvia Corean CROME, FNP  benzonatate  (TESSALON ) 100 MG capsule Take 1 capsule (100 mg total) by mouth 3 (three) times daily as needed for cough. Patient not taking: Reported on 08/15/2024  08/10/24   Murrill, Samantha, FNP  empagliflozin  (JARDIANCE ) 10 MG TABS tablet Take 1 tablet (10 mg total) by mouth daily. Patient not taking: Reported on 08/15/2024 08/07/24   [name], NP-C  erythromycin  ophthalmic ointment Apply ointment to eye sutures twice daily for 14 days Patient not taking: Reported on 08/15/2024 04/03/24     glucose blood (ONETOUCH VERIO) test strip USE TO TEST BLOOD SUGAR ONCE DAILY 06/27/24   Alvia Corean CROME, FNP  glucose blood test strip Use 1 strip to check glucose once daily. 06/27/24   Alvia Corean CROME, FNP  Tiotropium Bromide-Olodaterol (STIOLTO RESPIMAT ) 2.5-2.5 MCG/ACT AERS Inhale 2 puffs into the lungs daily. Patient not taking: Reported on 08/15/2024 08/15/24   Alghanim, Fahid, MD    Scheduled Meds:  arformoterol   15 mcg Nebulization BID   aspirin  EC  81 mg Oral Once per day on Monday Wednesday Friday  atorvastatin   40 mg Oral QPM   budesonide  (PULMICORT ) nebulizer solution  0.25 mg Nebulization BID   enoxaparin  (LOVENOX ) injection  40 mg Subcutaneous Daily   feeding supplement  237 mL Oral BID BM   guaiFENesin -dextromethorphan   10 mL Oral TID   insulin  aspart  0-15 Units Subcutaneous TID WC   insulin  aspart  0-5 Units Subcutaneous QHS   losartan   50 mg Oral Daily   metoprolol  tartrate  12.5 mg Oral BID   pantoprazole   40 mg Oral Daily   revefenacin   175 mcg Nebulization Daily   tamsulosin   0.4 mg Oral QPM   Continuous Infusions:  ceFEPime  (MAXIPIME ) IV 2 g (08/18/24 0400)   PRN Meds: acetaminophen  **OR** acetaminophen , albuterol , artificial tears, melatonin, ondansetron  **OR** ondansetron  (ZOFRAN ) IV, oxyCODONE , polyethylene glycol  Allergies:    Allergies  Allergen Reactions   Bystolic [Nebivolol Hcl] Other (See Comments)    Reaction not recalled    Social History:   Social History   Socioeconomic History   Marital status: Married    Spouse name: Not on file   Number of children: Not on file   Years of education: Not  on file   Highest education level: Not on file  Occupational History   Not on file  Tobacco Use   Smoking status: Former    Current packs/day: 0.00    Average packs/day: 0.5 packs/day for 10.0 years (5.0 ttl pk-yrs)    Types: Cigarettes    Start date: 09/12/1957    Quit date: 09/13/1967    Years since quitting: 56.9    Passive exposure: Past   Smokeless tobacco: Never  Vaping Use   Vaping status: Never Used  Substance and Sexual Activity   Alcohol  use: No   Drug use: No   Sexual activity: Not Currently  Other Topics Concern   Not on file  Social History Narrative   Married   Social Drivers of Health   Financial Resource Strain: Low Risk  (01/09/2024)   Overall Financial Resource Strain (CARDIA)    Difficulty of Paying Living Expenses: Not hard at all  Food Insecurity: No Food Insecurity (08/15/2024)   Hunger Vital Sign    Worried About Running Out of Food in the Last Year: Never true    Ran Out of Food in the Last Year: Never true  Transportation Needs: No Transportation Needs (08/15/2024)   PRAPARE - Administrator, Civil Service (Medical): No    Lack of Transportation (Non-Medical): No  Physical Activity: Sufficiently Active (01/09/2024)   Exercise Vital Sign    Days of Exercise per Week: 4 days    Minutes of Exercise per Session: 60 min  Stress: No Stress Concern Present (01/09/2024)   [employer] of Occupational Health - Occupational Stress Questionnaire    Feeling of Stress : Not at all  Social Connections: Socially Integrated (08/15/2024)   Social Connection and Isolation Panel    Frequency of Communication with Friends and Family: Three times a week    Frequency of Social Gatherings with Friends and Family: Three times a week    Attends Religious Services: More than 4 times per year    Active Member of Clubs or Organizations: Yes    Attends Banker Meetings: 1 to 4 times per year    Marital Status: Married  Catering Manager Violence: Not  At Risk (08/15/2024)   Humiliation, Afraid, Rape, and Kick questionnaire    Fear of Current or Ex-Partner: No  Emotionally Abused: No    Physically Abused: No    Sexually Abused: No    Family History:    Family History  Problem Relation Age of Onset   Diabetes Sister    Hypertension Sister    Heart disease Mother    Diabetes Sister    Hypertension Sister    Colon cancer Neg Hx    Colon polyps Neg Hx    Esophageal cancer Neg Hx    Rectal cancer Neg Hx    Stomach cancer Neg Hx      ROS:  Please see the history of present illness.   All other ROS reviewed and negative.     Physical Exam/Data: Vitals:   08/18/24 0439 08/18/24 0814 08/18/24 0818 08/18/24 0853  BP: 112/70   133/66  Pulse: 73     Resp: (!) 22     Temp: 98.4 F (36.9 C)     TempSrc: Oral     SpO2: 96% 96% 96% 94%  Weight:      Height:        Intake/Output Summary (Last 24 hours) at 08/18/2024 1003 Last data filed at 08/18/2024 0500 Gross per 24 hour  Intake 680 ml  Output 2500 ml  Net -1820 ml      08/15/2024    4:47 PM 08/14/2024    3:13 PM 08/05/2024    3:01 PM  Last 3 Weights  Weight (lbs) 157 lb 3 oz 163 lb 4.8 oz 162 lb  Weight (kg) 71.3 kg 74.072 kg 73.483 kg     Body mass index is 23.21 kg/m.  General:  Well nourished, but chronically ill appearing, no acute distress HEENT: normal Neck: no JVD Vascular: No carotid bruits; Distal pulses 2+ bilaterally Cardiac:  normal S1, S2; IRRR; no murmur  Lungs:  scattered rales in the right lung fields with egophony. Abd: soft, nontender, no hepatomegaly  Ext: no edema Musculoskeletal:  No deformities, BUE and BLE strength normal and equal Skin: warm and dry  Neuro:  CNs 2-12 intact, no focal abnormalities noted Psych:  Normal affect   EKG:  The EKG was personally reviewed and demonstrates:  nsr with PVCs Telemetry:  Telemetry was personally reviewed and demonstrates:  nsr with PVC's  Relevant CV Studies: EF 40-45%  Laboratory  Data: High Sensitivity Troponin:  No results for input(s): TROPONINIHS in the last 720 hours.   Chemistry Recent Labs  Lab 08/16/24 0141 08/17/24 0941 08/18/24 0423  NA 134* 135 136  K 3.7 3.9 4.1  CL 101 102 103  CO2 23 25 25   GLUCOSE 196* 176* 150*  BUN 18 17 15   CREATININE 1.21 1.18 1.02  CALCIUM  8.7* 8.6* 8.2*  MG 1.7 2.2 2.1  GFRNONAA 60* >60 >60  ANIONGAP 10 8 8     Recent Labs  Lab 08/15/24 1242  PROT 6.0*  ALBUMIN 2.9*  AST 10*  ALT 10  ALKPHOS 68  BILITOT 0.6   Lipids No results for input(s): CHOL, TRIG, HDL, LABVLDL, LDLCALC, CHOLHDL in the last 168 hours.  Hematology Recent Labs  Lab 08/15/24 1242 08/15/24 1256 08/16/24 0141  WBC 12.2*  --  11.6*  RBC 4.51  --  4.01*  HGB 13.1 13.3 11.8*  HCT 39.5 39.0 35.3*  MCV 87.6  --  88.0  MCH 29.0  --  29.4  MCHC 33.2  --  33.4  RDW 13.7  --  13.9  PLT 218  --  229   Thyroid  No results for input(s): TSH,  FREET4 in the last 168 hours.  BNP Recent Labs  Lab 08/15/24 1242  PROBNP 1,200.0*    DDimer  Recent Labs  Lab 08/14/24 1557  DDIMER 1.22*    Radiology/Studies:  ECHOCARDIOGRAM COMPLETE Result Date: 08/17/2024    ECHOCARDIOGRAM REPORT   Patient Name:   Jeferson R Byrns Date of Exam: 08/17/2024 Medical Rec #:  [id number]         Height:       69.0 in Accession #:    [id number]        Weight:       157.2 lb Date of Birth:  [birth year]         BSA:          1.865 m Patient Age:    83 years          BP:           110/70 mmHg Patient Gender: M                 HR:           79 bpm. Exam Location:  Inpatient Procedure: 2D Echo, Intracardiac Opacification Agent, Color Doppler and Cardiac            Doppler (Both Spectral and Color Flow Doppler were utilized during            procedure). Indications:    Dyspnea R06.00  History:        Patient has no prior history of Echocardiogram examinations.                 Risk Factors:Diabetes, Hypertension and Dyslipidemia.  Sonographer:    Tinnie Gosling  RDCS Referring Phys: [id number] DANIEL P GOODRICH IMPRESSIONS  1. Left ventricular ejection fraction, by estimation, is 40 to 45%. The left ventricle has mildly decreased function. The left ventricle has no regional wall motion abnormalities. There is mild left ventricular hypertrophy. Left ventricular diastolic parameters are consistent with Grade I diastolic dysfunction (impaired relaxation).  2. Right ventricular systolic function is normal. The right ventricular size is normal.  3. Left atrial size was moderately dilated.  4. A small pericardial effusion is present. The pericardial effusion is anterior to the right ventricle and localized near the right ventricle.  5. The mitral valve is abnormal. Trivial mitral valve regurgitation. No evidence of mitral stenosis.  6. The aortic valve is tricuspid. There is mild calcification of the aortic valve. There is mild thickening of the aortic valve. Aortic valve regurgitation is not visualized. Aortic valve sclerosis is present, with no evidence of aortic valve stenosis.  7. The inferior vena cava is dilated in size with >50% respiratory variability, suggesting right atrial pressure of 8 mmHg. FINDINGS  Left Ventricle: Abnormal septal motion and ventricular ectopy during study. Left ventricular ejection fraction, by estimation, is 40 to 45%. The left ventricle has mildly decreased function. The left ventricle has no regional wall motion abnormalities. Definity  contrast agent was given IV to delineate the left ventricular endocardial borders. Strain was performed and the global longitudinal strain is indeterminate. The left ventricular internal cavity size was normal in size. There is mild left ventricular hypertrophy. Left ventricular diastolic parameters are consistent with Grade I diastolic dysfunction (impaired relaxation). Right Ventricle: The right ventricular size is normal. No increase in right ventricular wall thickness. Right ventricular systolic function is normal.  Left Atrium: Left atrial size was moderately dilated. Right Atrium: Right atrial size was normal in size. Pericardium: A small pericardial effusion  is present. The pericardial effusion is anterior to the right ventricle and localized near the right ventricle. Mitral Valve: The mitral valve is abnormal. There is mild thickening of the mitral valve leaflet(s). Trivial mitral valve regurgitation. No evidence of mitral valve stenosis. Tricuspid Valve: The tricuspid valve is normal in structure. Tricuspid valve regurgitation is mild . No evidence of tricuspid stenosis. Aortic Valve: The aortic valve is tricuspid. There is mild calcification of the aortic valve. There is mild thickening of the aortic valve. Aortic valve regurgitation is not visualized. Aortic valve sclerosis is present, with no evidence of aortic valve stenosis. Pulmonic Valve: The pulmonic valve was normal in structure. Pulmonic valve regurgitation is trivial. No evidence of pulmonic stenosis. Aorta: The aortic root is normal in size and structure. Venous: The inferior vena cava is dilated in size with greater than 50% respiratory variability, suggesting right atrial pressure of 8 mmHg. IAS/Shunts: No atrial level shunt detected by color flow Doppler. Additional Comments: 3D was performed not requiring image post processing on an independent workstation and was indeterminate.  LEFT VENTRICLE PLAX 2D LVIDd:         5.60 cm      Diastology LVIDs:         4.30 cm      LV e' medial:    4.90 cm/s LV PW:         1.20 cm      LV E/e' medial:  10.7 LV IVS:        1.30 cm      LV e' lateral:   7.29 cm/s LVOT diam:     2.30 cm      LV E/e' lateral: 7.2 LV SV:         64 LV SV Index:   34 LVOT Area:     4.15 cm  LV Volumes (MOD) LV vol d, MOD A2C: 133.0 ml LV vol d, MOD A4C: 114.0 ml LV vol s, MOD A2C: 81.1 ml LV vol s, MOD A4C: 80.3 ml LV SV MOD A2C:     51.9 ml LV SV MOD A4C:     114.0 ml LV SV MOD BP:      47.7 ml RIGHT VENTRICLE             IVC RV S prime:      17.60 cm/s  IVC diam: 2.70 cm LEFT ATRIUM             Index        RIGHT ATRIUM           Index LA diam:        4.30 cm 2.31 cm/m   RA Area:     12.90 cm LA Vol (A2C):   56.6 ml 30.35 ml/m  RA Volume:   29.50 ml  15.82 ml/m LA Vol (A4C):   43.6 ml 23.38 ml/m LA Biplane Vol: 50.7 ml 27.19 ml/m  AORTIC VALVE LVOT Vmax:   74.60 cm/s LVOT Vmean:  49.000 cm/s LVOT VTI:    0.153 m  AORTA Ao Root diam: 3.60 cm Ao Asc diam:  3.70 cm MITRAL VALVE               TRICUSPID VALVE MV Area (PHT): 2.86 cm    TR Peak grad:   24.4 mmHg MV E velocity: 52.30 cm/s  TR Vmax:        247.00 cm/s MV A velocity: 76.30 cm/s MV E/A ratio:  0.69  SHUNTS                            Systemic VTI:  0.15 m                            Systemic Diam: 2.30 cm Maude Emmer MD Electronically signed by Maude Emmer MD Signature Date/Time: 08/17/2024/11:25:20 AM    Final    DG Chest 2 View Result Date: 08/15/2024 CLINICAL DATA:  post bronch. rule out pneumothorax EXAM: CHEST - 2 VIEW COMPARISON:  July 30, 2024 FINDINGS: Increasing airspace consolidation in the right lower lobe. Small pleural effusion on the right. No pneumothorax. The right infrahilar lung mass is less conspicuous on today's radiograph due to the airspace disease in the right lung base. Mild cardiomegaly. Aortic atherosclerosis. No acute fracture or destructive lesions. Multilevel thoracic osteophytosis. IMPRESSION: Increasing airspace consolidation in the right lower lobe, possibly worsening atelectasis or airspace disease given the patient's right infrahilar lung mass. Small right pleural effusion. Electronically Signed   By: Rogelia Myers M.D.   On: 08/15/2024 15:29   CT Angio Chest PE W and/or Wo Contrast Result Date: 08/15/2024 CLINICAL DATA:  Pulmonary embolism (PE) suspected, low to intermediate prob, positive D-dimer EXAM: CT ANGIOGRAPHY CHEST WITH CONTRAST TECHNIQUE: Multidetector CT imaging of the chest was performed using the standard protocol during bolus  administration of intravenous contrast. Multiplanar CT image reconstructions and MIPs were obtained to evaluate the vascular anatomy. RADIATION DOSE REDUCTION: This exam was performed according to the departmental dose-optimization program which includes automated exposure control, adjustment of the mA and/or kV according to patient size and/or use of iterative reconstruction technique. CONTRAST:  75mL OMNIPAQUE  IOHEXOL  350 MG/ML SOLN COMPARISON:  08/14/2024, 07/22/2024, 07/02/2024 FINDINGS: Pulmonary Embolism: No pulmonary embolism. Cardiovascular: Mild cardiomegaly. Moderate volume pericardial effusion.No aortic aneurysm. Mediastinum/Nodes: No mediastinal mass.Subcarinal lymph node measuring 1.4 cm (axial 76). Unchanged enlarged lower left paratracheal lymph node. Lungs/Pleura: The midline trachea and bronchi are patent. Redemonstration of a right infrahilar mass, which measures 6 x 7.7 x 6.4 cm (previously, 5.7 x 6.3 x 5.6 cm). The mass is again noted to completely obstruct the right middle lobe bronchus with severe to near complete narrowing of the right lower lobe bronchi. The mass also significantly narrows the right interlobar pulmonary artery in the right middle lobe pulmonary artery with complete occlusion of the superior right pulmonary vein. Interval development of dense airspace consolidation filling the majority of the right lower lobe with unchanged, but significant atelectasis of the right middle lobe. No pneumothorax. Small right pleural effusion. Musculoskeletal: No acute fracture. Similar lucent lesion in the right scapula measuring 1.3 cm, which was not FDG avid on the prior PET-CT. Multilevel degenerative disc disease of the spine. Thoracic DISH. Upper Abdomen: No acute abnormality in the partially visualized upper abdomen. Reflux of contrast into the hepatic veins. Review of the MIP images confirms the above findings. IMPRESSION: 1. No pulmonary embolism. 2. Enlarging right infrahilar mass, as  measured above. This causes complete obstruction of the right middle lobe bronchus with severe to near-complete narrowing of the right lower lobe bronchi. New dense airspace consolidation filling the majority of the right lower lobe. While this may represent right lower lobe atelectasis, it would be worrisome for postobstructive pneumonia, in the correct clinical context. Small right pleural effusion. 3. Reflux of contrast into the hepatic veins is suggestive of underlying cardiac dysfunction. 4.  Redemonstrated multistation mediastinal lymph nodes, with a more prominent subcarinal lymph node, concerning for worsening metastatic disease. Aortic Atherosclerosis (ICD10-I70.0). Electronically Signed   By: Rogelia Myers M.D.   On: 08/15/2024 15:26   VAS US  LOWER EXTREMITY VENOUS (DVT) (ONLY [facility]) Result Date: 08/15/2024  Lower Venous DVT Study Patient Name:  Avant R Steuber  Date of Exam:   08/15/2024 Medical Rec #: [id number]          Accession #:    [id number] Date of Birth: Aug 30, 1941          Patient Gender: M Patient Age:   69 years Exam Location:  [facility] Procedure:      VAS US  LOWER EXTREMITY VENOUS (DVT) Referring Phys: MAUDE MESSICK --------------------------------------------------------------------------------  Indications: SOB. Other Indications: History of lung cancer. Risk Factors: COPD. Comparison Study: No priors. Performing Technologist: Ricka Sturdivant-Jones RDMS, RVT  Examination Guidelines: A complete evaluation includes B-mode imaging, spectral Doppler, color Doppler, and power Doppler as needed of all accessible portions of each vessel. Bilateral testing is considered an integral part of a complete examination. Limited examinations for reoccurring indications may be performed as noted. The reflux portion of the exam is performed with the patient in reverse Trendelenburg.  +---------+---------------+---------+-----------+----------+--------------+ RIGHT     CompressibilityPhasicitySpontaneityPropertiesThrombus Aging +---------+---------------+---------+-----------+----------+--------------+ CFV      Full           Yes      Yes                                 +---------+---------------+---------+-----------+----------+--------------+ SFJ      Full                                                        +---------+---------------+---------+-----------+----------+--------------+ FV Prox  Full                                                        +---------+---------------+---------+-----------+----------+--------------+ FV Mid   Full           Yes      Yes                                 +---------+---------------+---------+-----------+----------+--------------+ FV DistalFull                                                        +---------+---------------+---------+-----------+----------+--------------+ PFV      Full                                                        +---------+---------------+---------+-----------+----------+--------------+ POP      Full           Yes  Yes                                 +---------+---------------+---------+-----------+----------+--------------+ PTV      Full                                                        +---------+---------------+---------+-----------+----------+--------------+ PERO     Full                                                        +---------+---------------+---------+-----------+----------+--------------+   +---------+---------------+---------+-----------+----------+--------------+ LEFT     CompressibilityPhasicitySpontaneityPropertiesThrombus Aging +---------+---------------+---------+-----------+----------+--------------+ CFV      Full           Yes      Yes                                 +---------+---------------+---------+-----------+----------+--------------+ SFJ      Full                                                         +---------+---------------+---------+-----------+----------+--------------+ FV Prox  Full                                                        +---------+---------------+---------+-----------+----------+--------------+ FV Mid   Full           No       Yes                                 +---------+---------------+---------+-----------+----------+--------------+ FV DistalFull                                                        +---------+---------------+---------+-----------+----------+--------------+ PFV      Full                                                        +---------+---------------+---------+-----------+----------+--------------+ POP      Full           Yes      Yes                                 +---------+---------------+---------+-----------+----------+--------------+ PTV      Full                                                        +---------+---------------+---------+-----------+----------+--------------+  PERO     Full                                                        +---------+---------------+---------+-----------+----------+--------------+     Summary: BILATERAL: - No evidence of deep vein thrombosis seen in the lower extremities, bilaterally. -No evidence of popliteal cyst, bilaterally.   *See table(s) above for measurements and observations. Electronically signed by Debby Robertson on 08/15/2024 at 2:26:10 PM.    Final      Assessment and Plan: PVC', multifocal and NSVT - Ill uptitrate his beta blocker. In light of his CA the is the least of his problems. Post obstructive pneumonia - On IV anti-biotics.  Multiple lobar CA - while he has no obvious metastatic disease, his large CA burden is concerning and prognosis is poor.  No additional rec's. Call back if his ectopy becomes symptomatic.    Signed, Danelle Birmingham, MD  08/18/2024 10:03 AM

## 2024-08-18 NOTE — Progress Notes (Addendum)
  Progress Note   Patient: Edward [name] FMW:[id number] DOB: 11-09-1940 DOA: 08/15/2024     3 DOS: the patient was seen and examined on 08/18/2024   Brief hospital course: 83 year old man recent diagnosis well-differentiated neuroendocrine tumor, seen by pulmonary in the office 12/4 and sent to the hospital for admission for postobstructive pneumonia, shortness of breath.  Consultants Pulmonology  Cardiology   Procedures/Events 12/4 admission  Assessment and Plan: Multilobar postobstructive pneumonia on the right Small cell right-sided lung cancer, well-differentiated neuroendocrine tumor Acute hypoxic respiratory failure Slowly improving.  Continue empiric antibiotics, follow-up culture data.  Appreciate pulmonology.  Plan to complete 3 to 4 weeks of Augmentin . Follow-up with oncology as an outpatient May need home oxygen   Frequent PVCs NSVT Electrolytes stable.  Uptitrate beta-blocker as tolerated.  No further evaluation per cardiology unless patient becomes symptomatic.  COPD Remains stable.  Continue triple nebulizer therapy   CKD stage IIIa ruled out   Diabetes mellitus type 2 CBG remains stable.  Continue sliding scale insulin .  Hold metformin .   Chronic systolic CHF Elevated troponin Elevated BNP Given reflux of contrast into hepatic veins suggesting cardiac dysfunction, echocardiogram showed LVEF 40-45% with no regional wall motion abnormalities.  Grade 1 diastolic dysfunction.  Normal RV systolic function Continue beta-blocker, uptitrate this first, if blood pressure can tolerate can consider adding further GDMT.  Follow clinically for now, can follow-up with cardiology as an outpatient.  Seems to be improving.  Will ask PT and OT to see.  Hopefully home in the next 2 days.    Subjective:  Feels a little better today  Physical Exam: Vitals:   08/18/24 0818 08/18/24 0853 08/18/24 1003 08/18/24 1349  BP:  133/66  124/80  Pulse:    74  Resp:    20  Temp:     98.2 F (36.8 C)  TempSrc:    Oral  SpO2: 96% 94%  95%  Weight:   70.9 kg   Height:       Physical Exam Vitals reviewed.  Constitutional:      General: He is not in acute distress.    Appearance: He is ill-appearing. He is not toxic-appearing.  Cardiovascular:     Rate and Rhythm: Normal rate and regular rhythm.     Heart sounds: No murmur heard.    Comments: Telemetry SR with frequent PVCs, a few episodes of NSVT Neurological:     Mental Status: He is alert.  Psychiatric:        Mood and Affect: Mood normal.        Behavior: Behavior normal.     Data Reviewed: UOP 2,500  Family Communication: none  Disposition: Status is: Inpatient Remains inpatient appropriate because: pneumonia     Time spent: 20 minutes  Author: Toribio Door, MD 08/18/2024 1:51 PM  For on call review [email or web address].

## 2024-08-18 NOTE — Progress Notes (Signed)
 NAME:  Edward [name], MRN:  [id number], DOB:  Dec 24, 1940, LOS: 3 ADMISSION DATE:  08/15/2024, CONSULTATION DATE: 08/15/2024 REFERRING MD: Dr. Laurice, CHIEF COMPLAINT: Postobstructive pneumonia  History of Present Illness:  Patient with a recent diagnosis of well-differentiated neuroendocrine tumor, was seen in pulmonary clinic yesterday with complaints of shortness of breath, cough, fevers.  Started on Levaquin .  Worsening symptoms led to him presenting to the emergency department with increased shortness of breath Diagnosed with neuroendocrine tumor 07/30/2024-had bronchoscopy following evaluation for lung mass   Plan for radiation and chemotherapy-follows with oncology   A week history of fever cough shortness of breath Was seen at urgent care, was given cough medicine and a shot of steroids that did not help Past smoking history, quit in the 70s  Pertinent  Medical History   Past Medical History:  Diagnosis Date   BPH (benign prostatic hyperplasia)    Cataract    surgical removal bilateral   CKD (chronic kidney disease) stage 3, GFR 30-59 ml/min (HCC)    DM (diabetes mellitus), type 2 (HCC)    ED (erectile dysfunction)    GERD (gastroesophageal reflux disease)    History of adenomatous polyp of colon 11/02/2017   Tubular adenoma 2013; recommended 5 year follow up   History of kidney stones    Hyperlipidemia    Hypertension    IBS (irritable bowel syndrome)    Kidney stones 02/11/2020   LBBB (left bundle branch block)    Lung nodule    Personal history of kidney stones    Vitamin D  deficiency    Significant Hospital Events: Including procedures, antibiotic start and stop dates in addition to other pertinent events   07/02/2024 CT chest-large lobular mass inferior right hilum 07/22/2024 hypermetabolic right perihilar mass on PET scan, small right pleural effusion with some postobstructive collapse of the airway 07/30/2024-cytology well-differentiated neuroendocrine  tumor 08/15/2024-CT negative for PE, postobstructive pneumonia large right hilar mass, pleural effusion  Interim History / Subjective:  Feeling better Still coughing but much less Still getting winded with activity  Objective    Blood pressure 133/66, pulse 73, temperature 98.4 F (36.9 C), temperature source Oral, resp. rate (!) 22, height 5' 9 (1.753 m), weight 70.9 kg, SpO2 94%.        Intake/Output Summary (Last 24 hours) at 08/18/2024 1227 Last data filed at 08/18/2024 0500 Gross per 24 hour  Intake 680 ml  Output 1750 ml  Net -1070 ml   Filed Weights   08/15/24 1647 08/18/24 1003  Weight: 71.3 kg 70.9 kg    Examination: General: Chronically ill-appearing HENT: Moist oral mucosa Lungs: Decreased air movement bilaterally Cardiovascular: S1-S2 appreciated Abdomen: Soft, bowel sounds appreciated Extremities: No clubbing, no edema Neuro: Awake, alert, moving all extremities GU:   I reviewed last 24 h vitals and pain scores, last 48 h intake and output, last 24 h labs and trends, and last 24 h imaging results.  Resolved problem list   Assessment and Plan   Patient with a history of neuroendocrine tumor with postobstructive pneumonia came in with cough, worsening shortness of breath He had a recent bronchoscopy Follows with oncology to start chemoradiation at some point  Multilobar postobstructive pneumonia Feeling better - On cefepime  - MRSA PCR negative - Fever resolved - Clinically better - Will switch to Augmentin  - Plan for 3 to 4 weeks of Augmentin  - Obtain CBC in a.m.  Chronic obstructive pulmonary disease - Continue triple neb therapy  Hypoxic respiratory failure - Continue  oxygen supplementation  Chronic kidney disease stage III  Type 2 diabetes - Continue SSI  Continue antitussive   Hypertension

## 2024-08-19 ENCOUNTER — Other Ambulatory Visit (HOSPITAL_COMMUNITY): Payer: Self-pay

## 2024-08-19 ENCOUNTER — Ambulatory Visit

## 2024-08-19 ENCOUNTER — Inpatient Hospital Stay: Admitting: Physician Assistant

## 2024-08-19 ENCOUNTER — Other Ambulatory Visit: Payer: Self-pay

## 2024-08-19 LAB — CBC WITH DIFFERENTIAL/PLATELET
Abs Immature Granulocytes: 0.05 K/uL (ref 0.00–0.07)
Basophils Absolute: 0 K/uL (ref 0.0–0.1)
Basophils Relative: 0 %
Eosinophils Absolute: 0.1 K/uL (ref 0.0–0.5)
Eosinophils Relative: 1 %
HCT: 36.9 % — ABNORMAL LOW (ref 39.0–52.0)
Hemoglobin: 11.9 g/dL — ABNORMAL LOW (ref 13.0–17.0)
Immature Granulocytes: 1 %
Lymphocytes Relative: 8 %
Lymphs Abs: 0.7 K/uL (ref 0.7–4.0)
MCH: 28.9 pg (ref 26.0–34.0)
MCHC: 32.2 g/dL (ref 30.0–36.0)
MCV: 89.6 fL (ref 80.0–100.0)
Monocytes Absolute: 0.6 K/uL (ref 0.1–1.0)
Monocytes Relative: 7 %
Neutro Abs: 7.5 K/uL (ref 1.7–7.7)
Neutrophils Relative %: 83 %
Platelets: 290 K/uL (ref 150–400)
RBC: 4.12 MIL/uL — ABNORMAL LOW (ref 4.22–5.81)
RDW: 13.7 % (ref 11.5–15.5)
WBC: 8.9 K/uL (ref 4.0–10.5)
nRBC: 0 % (ref 0.0–0.2)

## 2024-08-19 LAB — RAD ONC ARIA SESSION SUMMARY
Course Elapsed Days: 3
Plan Fractions Treated to Date: 2
Plan Prescribed Dose Per Fraction: 2 Gy
Plan Total Fractions Prescribed: 30
Plan Total Prescribed Dose: 60 Gy
Reference Point Dosage Given to Date: 4 Gy
Reference Point Session Dosage Given: 2 Gy
Session Number: 2

## 2024-08-19 LAB — GLUCOSE, CAPILLARY
Glucose-Capillary: 165 mg/dL — ABNORMAL HIGH (ref 70–99)
Glucose-Capillary: 141 mg/dL — ABNORMAL HIGH (ref 70–99)

## 2024-08-19 MED ORDER — ALBUTEROL SULFATE HFA 108 (90 BASE) MCG/ACT IN AERS
2.0000 | INHALATION_SPRAY | RESPIRATORY_TRACT | 2 refills | Status: AC | PRN
  Filled 2024-08-19: qty 6.7, 20d supply, fill #0
  Filled 2024-08-20: qty 6.7, 30d supply, fill #0

## 2024-08-19 MED ORDER — AMOXICILLIN-POT CLAVULANATE 875-125 MG PO TABS
1.0000 | ORAL_TABLET | Freq: Two times a day (BID) | ORAL | 0 refills | Status: DC
  Filled 2024-08-19 (×3): qty 56, 28d supply, fill #0

## 2024-08-19 MED ORDER — METOPROLOL TARTRATE 25 MG PO TABS
12.5000 mg | ORAL_TABLET | Freq: Two times a day (BID) | ORAL | 0 refills | Status: DC
  Filled 2024-08-19: qty 30, 30d supply, fill #0

## 2024-08-19 NOTE — Progress Notes (Signed)
 Home medications delivered from outpatient pharmacy. Discharge instructions provided and discussed with wife and pt. All questions answered. IV removed, Pt assisted in dressing and taken to exit via wheelchair by NT and RN at approx 1540

## 2024-08-19 NOTE — Progress Notes (Signed)
 Discharge medications delivered to patient and family at the bedside

## 2024-08-19 NOTE — Care Management Important Message (Signed)
 Important Message  Patient Details IM Letter given. Name: Edward [name] MRN: [id number] Date of Birth: Jun 10, 1941   Important Message Given:  Yes - Medicare IM     [name] 08/19/2024, 1:48 PM

## 2024-08-19 NOTE — Plan of Care (Signed)
   Problem: Education: Goal: Knowledge of General Education information will improve Description: Including pain rating scale, medication(s)/side effects and non-pharmacologic comfort measures Outcome: Progressing   Problem: Coping: Goal: Level of anxiety will decrease Outcome: Progressing   Problem: Safety: Goal: Ability to remain free from injury will improve Outcome: Progressing

## 2024-08-19 NOTE — Evaluation (Signed)
 Physical Therapy Evaluation Patient Details Name: Edward [name] MRN: [id number] DOB: 1941-07-03 Today's Date: 08/19/2024  History of Present Illness  83 yo M adm postobstructive PNA started on Levaquin12/3 note to have hypoxia at home on RA. Workup revealed R lung collapse.  PMH carcinoid lung CA stage IIIc, BPH, CKD III, DM2, GERD, HTN, HLD, IBS, LBBB, remote smoker  Clinical Impression  Pt admitted with above diagnosis.  He is active and independent at baseline.  Today, pt ambulating 300' with supervision and no AD.  He was on RA with sats 92% rest and 94% on RA with ambulation (MD and RN aware).  Pt does fatigue easier than baseline, but expected to improve without further skilled PT services.  Recommended gradually increasing activity in multiple bouts throughout the day.        If plan is discharge home, recommend the following:     Can travel by private vehicle        Equipment Recommendations None recommended by PT  Recommendations for Other Services       Functional Status Assessment Patient has had a recent decline in their functional status and demonstrates the ability to make significant improvements in function in a reasonable and predictable amount of time.     Precautions / Restrictions Precautions Precautions: Fall      Mobility  Bed Mobility   Bed Mobility: Supine to Sit, Sit to Supine     Supine to sit: Supervision Sit to supine: Supervision   General bed mobility comments: bed elevated, no rail used    Transfers Overall transfer level: Needs assistance Equipment used: None Transfers: Sit to/from Stand Sit to Stand: Supervision           General transfer comment: Performed x 2    Ambulation/Gait Ambulation/Gait assistance: Supervision Gait Distance (Feet): 300 Feet Assistive device: None Gait Pattern/deviations: WFL(Within Functional Limits) Gait velocity: normal     General Gait Details: started CGA progressed supervision; normal  gait pattern but reports fatigues a little easier than baseline  Stairs            Wheelchair Mobility     Tilt Bed    Modified Rankin (Stroke Patients Only)       Balance Overall balance assessment: Needs assistance Sitting-balance support: No upper extremity supported Sitting balance-Leahy Scale: Good     Standing balance support: No upper extremity supported Standing balance-Leahy Scale: Good                               Pertinent Vitals/Pain Pain Assessment Pain Assessment: No/denies pain    Home Living Family/patient expects to be discharged to:: Private residence Living Arrangements: Spouse/significant other Available Help at Discharge: Family;Available 24 hours/day Type of Home: House Home Access: Stairs to enter;Ramped entrance Entrance Stairs-Rails: Right Entrance Stairs-Number of Steps: 4   Home Layout: One level Home Equipment: None      Prior Function Prior Level of Function : Independent/Modified Independent             Mobility Comments: independent, still driving ADLs Comments: fully independent, enjoys playing volleyball     Extremity/Trunk Assessment   Upper Extremity Assessment Upper Extremity Assessment: Overall WFL for tasks assessed    Lower Extremity Assessment Lower Extremity Assessment: Overall WFL for tasks assessed    Cervical / Trunk Assessment Cervical / Trunk Assessment: Normal  Communication        Cognition Arousal: Alert Behavior During  Therapy: WFL for tasks assessed/performed   PT - Cognitive impairments: No apparent impairments                                 Cueing       General Comments General comments (skin integrity, edema, etc.): Pt on RA wats 92% rest and up to 94% ambulation    Exercises     Assessment/Plan    PT Assessment Patient does not need any further PT services  PT Problem List         PT Treatment Interventions      PT Goals (Current goals can  be found in the Care Plan section)  Acute Rehab PT Goals Patient Stated Goal: return home and to normal activity PT Goal Formulation: All assessment and education complete, DC therapy    Frequency       Co-evaluation               AM-PAC PT 6 Clicks Mobility  Outcome Measure Help needed turning from your back to your side while in a flat bed without using bedrails?: None Help needed moving from lying on your back to sitting on the side of a flat bed without using bedrails?: None Help needed moving to and from a bed to a chair (including a wheelchair)?: None Help needed standing up from a chair using your arms (e.g., wheelchair or bedside chair)?: None Help needed to walk in hospital room?: None Help needed climbing 3-5 steps with a railing? : A Little 6 Click Score: 23    End of Session Equipment Utilized During Treatment: Gait belt Activity Tolerance: Patient tolerated treatment well Patient left: in chair;with call bell/phone within reach;with family/visitor present Nurse Communication: Mobility status PT Visit Diagnosis: Other abnormalities of gait and mobility (R26.89)    Time: 8755-8745 PT Time Calculation (min) (ACUTE ONLY): 10 min   Charges:   PT Evaluation $PT Eval Low Complexity: 1 Low   PT General Charges $$ ACUTE PT VISIT: 1 Visit         Benjiman, PT Acute Rehab Services [facility] [phone]   [name] 08/19/2024, 12:58 PM

## 2024-08-19 NOTE — Progress Notes (Signed)
 Heart Failure Navigator Progress Note  Assessed for Heart & Vascular TOC clinic readiness.  Patient does not meet criteria due to admitted with postobstructive pneumonia and shortness of breath, small cell right sided lung cancer. Has a scheduled TCTS appointment with Dr. Kerrin on 08/27/2024. No HF TOC.   Navigator will sign off at this time.   Stephane Haddock, BSN, Scientist, Clinical (histocompatibility And Immunogenetics) Only

## 2024-08-19 NOTE — Evaluation (Signed)
 Occupational Therapy Evaluation Patient Details Name: Edward [name] MRN: [id number] DOB: 07-11-1941 Today's Date: 08/19/2024   History of Present Illness   83 yo M adm postobstructive PNA started on Levaquin12/3 note to have hypoxia at home on RA. Workup revealed R lung collapse.  PMH carcinoid lung CA stage IIIc, BPH, CKD III, DM2, GERD, HTN, HLD, IBS, LBBB, remote smoker     Clinical Impressions Prior to this admission, patient living with his spouse, driving, and still playing volleyball at his church. Currently, patient is on RA, with sat 91-92% and is back at his baseline for ADL management. Patient ambulating in hallway at [facility] level without AD, but does endorse minimal generalized weakness and decreased activity tolerance. Patient with no acute OT needs, however would benefit from continued PT and mobility specialists in order to increase activity tolerance. OT will sign off at this time, please re-consult if further acute OT needs arise.  HR 95 to 122 (non sustaining) when assessed on monitor, O2 91-92% on RA      If plan is discharge home, recommend the following:   Assist for transportation;Assistance with cooking/housework;A little help with bathing/dressing/bathroom;A little help with walking and/or transfers (initially)     Functional Status Assessment   Patient has not had a recent decline in their functional status     Equipment Recommendations   None recommended by OT     Recommendations for Other Services         Precautions/Restrictions   Precautions Precautions: Fall;Other (comment) Recall of Precautions/Restrictions: Intact Precaution/Restrictions Comments: watch O2 Restrictions Weight Bearing Restrictions Per Provider Order: No     Mobility Bed Mobility Overal bed mobility: Needs Assistance Bed Mobility: Supine to Sit     Supine to sit: Supervision     General bed mobility comments: minimal increased time    Transfers Overall  transfer level: Needs assistance Equipment used: None Transfers: Sit to/from Stand Sit to Stand: Contact guard assist           General transfer comment: CGA for safety, ambulating in hallway and requesting to turn around due to fatigue      Balance Overall balance assessment: Mild deficits observed, not formally tested                                         ADL either performed or assessed with clinical judgement   ADL Overall ADL's : At baseline                                       General ADL Comments: Prior to this admission, patient living with his spouse, driving, and still playing volleyball at his church. Currently, patient is on RA, with sat 91-92% and is back at his baseline for ADL management. Patient ambulating in hallway at [facility] level without AD, but does endorse minimal generalized weakness and decreased activity tolerance. Patient with no acute OT needs, however would benefit from continued PT and mobility specialists in order to increase activity tolerance. OT will sign off at this time, please re-consult if further acute OT needs arise.     Vision Baseline Vision/History: 0 No visual deficits Ability to See in Adequate Light: 0 Adequate Patient Visual Report: No change from baseline Vision Assessment?: No apparent visual deficits     Perception  Perception: Not tested       Praxis Praxis: Not tested       Pertinent Vitals/Pain Pain Assessment Pain Assessment: No/denies pain     Extremity/Trunk Assessment Upper Extremity Assessment Upper Extremity Assessment: Right hand dominant;Generalized weakness   Lower Extremity Assessment Lower Extremity Assessment: Defer to PT evaluation   Cervical / Trunk Assessment Cervical / Trunk Assessment: Normal   Communication Communication Communication: Impaired Factors Affecting Communication: Hearing impaired   Cognition Arousal: Alert Behavior During Therapy: WFL for tasks  assessed/performed Cognition: Cognition impaired     Awareness: Intellectual awareness intact, Online awareness intact Memory impairment (select all impairments): Short-term memory Attention impairment (select first level of impairment): Selective attention Executive functioning impairment (select all impairments): Organization OT - Cognition Comments: Occasional wrong answers with home set up, question HO vs comprehensin                 Following commands: Intact       Cueing  General Comments   Cueing Techniques: Verbal cues  HR 95 to 122 (non sustaining) when assessed on monitor, O2 91-92% on RA   Exercises     Shoulder Instructions      Home Living Family/patient expects to be discharged to:: Private residence Living Arrangements: Spouse/significant other Available Help at Discharge: Family;Available 24 hours/day Type of Home: House Home Access: Stairs to enter;Ramped entrance Entrance Stairs-Number of Steps: 4 Entrance Stairs-Rails: Right Home Layout: One level     Bathroom Shower/Tub: Tub only;Walk-in shower   Bathroom Toilet: Handicapped height     Home Equipment: None          Prior Functioning/Environment Prior Level of Function : Independent/Modified Independent             Mobility Comments: independent, still driving ADLs Comments: fully independent, enjoys playing volleyball    OT Problem List: Decreased activity tolerance   OT Treatment/Interventions:        OT Goals(Current goals can be found in the care plan section)   Acute Rehab OT Goals Patient Stated Goal: to get better OT Goal Formulation: With patient Time For Goal Achievement: 09/02/24 Potential to Achieve Goals: Good   OT Frequency:       Co-evaluation              AM-PAC OT 6 Clicks Daily Activity     Outcome Measure Help from another person eating meals?: None Help from another person taking care of personal grooming?: None Help from another  person toileting, which includes using toliet, bedpan, or urinal?: A Little Help from another person bathing (including washing, rinsing, drying)?: A Little Help from another person to put on and taking off regular upper body clothing?: None Help from another person to put on and taking off regular lower body clothing?: A Little 6 Click Score: 21   End of Session Equipment Utilized During Treatment: Gait belt Nurse Communication: Mobility status  Activity Tolerance: Patient tolerated treatment well Patient left: in chair;with call bell/phone within reach  OT Visit Diagnosis: Other abnormalities of gait and mobility (R26.89);Muscle weakness (generalized) (M62.81)                Time: 9162-9141 OT Time Calculation (min): 21 min Charges:  OT General Charges $OT Visit: 1 Visit OT Evaluation $OT Eval Moderate Complexity: 1 Mod  Ronal Gift E. Rito Lecomte, OTR/L Acute Rehabilitation Services [phone]   Ronal Gift Salt 08/19/2024, 10:56 AM

## 2024-08-19 NOTE — TOC Transition Note (Signed)
 Transition of Care [facility]) - Discharge Note   Patient Details  Name: Edward [name] MRN: [id number] Date of Birth: 31-Aug-1941  [facility] CM/SW Contact:  Jon ONEIDA Anon, RN Phone Number: 08/19/2024, 1:46 PM   Clinical Narrative:    Pt to DC home. IP CM consulted for home nebulizer machine. DME orders sent to [place] with [employer]. Nebulizer will be delivered to pt room prior to discharge. No further IP CM needs at this time. Will sign off.   Final next level of care: Home/Self Care Barriers to Discharge: Barriers Resolved   Patient Goals and CMS Choice Patient states their goals for this hospitalization and ongoing recovery are:: Return home CMS Medicare.gov Compare Post Acute Care list provided to:: Patient Choice offered to / list presented to : Patient [place] ownership interest in [facility].provided to:: Patient    Discharge Placement                       Discharge Plan and Services Additional resources added to the After Visit Summary for                  DME Arranged: Nebulizer machine DME Agency: [employer] Date DME Agency Contacted: 08/19/24 Time DME Agency Contacted: 1345 Representative spoke with at DME Agency: [place] with [employer] HH Arranged: NA HH Agency: NA        Social Drivers of Health (SDOH) Interventions SDOH Screenings   Food Insecurity: No Food Insecurity (08/15/2024)  Housing: Low Risk  (08/15/2024)  Transportation Needs: No Transportation Needs (08/15/2024)  Utilities: Not At Risk (08/15/2024)  Alcohol  Screen: Low Risk  (01/09/2024)  Depression (PHQ2-9): Low Risk  (06/27/2024)  Financial Resource Strain: Low Risk  (01/09/2024)  Physical Activity: Sufficiently Active (01/09/2024)  Social Connections: Socially Integrated (08/15/2024)  Stress: No Stress Concern Present (01/09/2024)  Tobacco Use: Medium Risk (08/15/2024)  Health Literacy: Adequate Health Literacy (01/09/2024)     Readmission Risk  Interventions     No data to display

## 2024-08-19 NOTE — Progress Notes (Signed)
 NAME:  Edward [name], MRN:  [id number], DOB:  September 21, 1940, LOS: 4 ADMISSION DATE:  08/15/2024, CONSULTATION DATE:  08/19/24 REFERRING MD:  Dr. Claudis, CHIEF COMPLAINT:  post obstructive pneumonia   History of Present Illness:  Edward [name] is a 83 y.o. male with DM, HTN, HLD, GERD, and COPD who initially presented for evaluation of right lung mass found to have stage III C atypical carcinoid tumor who presents for acute hypoxic respiratory failure due to post obstructive pneumonia. Pulmonology consulted for evaluation and management of the latter.  Patient with a recent diagnosis of well-differentiated neuroendocrine tumor, was seen in pulmonary clinic yesterday with complaints of shortness of breath, cough, fevers.  Started on Levaquin .  Worsening symptoms led to him presenting to the emergency department with increased shortness of breath Diagnosed with neuroendocrine tumor 07/30/2024-had bronchoscopy following evaluation for lung mass   Plan for radiation and chemotherapy-follows with oncology   A week history of fever cough shortness of breath Was seen at urgent care, was given cough medicine and a shot of steroids that did not help Past smoking history, quit in the 70s Pertinent  Medical History   Past Medical History:  Diagnosis Date   BPH (benign prostatic hyperplasia)    Cataract    surgical removal bilateral   CKD (chronic kidney disease) stage 3, GFR 30-59 ml/min (HCC)    DM (diabetes mellitus), type 2 (HCC)    ED (erectile dysfunction)    GERD (gastroesophageal reflux disease)    History of adenomatous polyp of colon 11/02/2017   Tubular adenoma 2013; recommended 5 year follow up   History of kidney stones    Hyperlipidemia    Hypertension    IBS (irritable bowel syndrome)    Kidney stones 02/11/2020   LBBB (left bundle branch block)    Lung nodule    Personal history of kidney stones    Vitamin D  deficiency    Significant Hospital Events: Including  procedures, antibiotic start and stop dates in addition to other pertinent events   07/02/2024 CT chest-large lobular mass inferior right hilum 07/22/2024 hypermetabolic right perihilar mass on PET scan, small right pleural effusion with some postobstructive collapse of the airway 07/30/2024-cytology well-differentiated neuroendocrine tumor 08/15/2024-CT negative for PE, postobstructive pneumonia large right hilar mass, pleural effusion  Interim History / Subjective:  Feels much better compared to last week. Was able to walk up and down hallway without oxygen. Cough improved. Fever improved.  Objective    Blood pressure 103/63, pulse 70, temperature 98.9 F (37.2 C), resp. rate 18, height 5' 9 (1.753 m), weight 71.3 kg, SpO2 93%.    FiO2 (%):  [21 %] 21 %   Intake/Output Summary (Last 24 hours) at 08/19/2024 9177 Last data filed at 08/19/2024 0103 Gross per 24 hour  Intake 340 ml  Output 1100 ml  Net -760 ml   Filed Weights   08/15/24 1647 08/18/24 1003 08/19/24 0500  Weight: 71.3 kg 70.9 kg 71.3 kg   Examination: General: awake, well appearing, no distress HENT: anicteric sclera, well injected conjunctivae, oral and nasal mucosa intact Lungs: CTAB Cardiovascular: RRR, no murmurs Abdomen: soft, non-tender Extremities: trace edema Neuro: alert and oriented x 3 GU: deferred  Resolved problem list   Assessment and Plan   #Acute Hypoxic Respiratory Failure: #Post Obstructive Pneumonia: #Stage III C Atypical Carcinoid: - Wean O2 as tolerated - Agree with long duration for antimicrobials given that obstruction will not be relieved without oncologic treatments. - Continue ICS/LAMA/LABA -  Appreciate radiation oncology involvement in his care and expediting it.  Pulmonology will sign off for now. The patient has follow up in the pulmonary office with me.  Patient Lines/Drains/Airways Status     Active Line/Drains/Airways     Name Placement date Placement time Site Days    Peripheral IV 08/15/24 18 G Left Antecubital 08/15/24  1204  Antecubital  4   External Urinary Catheter 08/15/24  2100  --  4            Labs   CBC: Recent Labs  Lab 08/15/24 1242 08/15/24 1256 08/16/24 0141 08/19/24 0350  WBC 12.2*  --  11.6* 8.9  NEUTROABS 10.8*  --   --  7.5  HGB 13.1 13.3 11.8* 11.9*  HCT 39.5 39.0 35.3* 36.9*  MCV 87.6  --  88.0 89.6  PLT 218  --  229 290    Basic Metabolic Panel: Recent Labs  Lab 08/15/24 1242 08/15/24 1256 08/16/24 0141 08/17/24 0941 08/18/24 0423  NA 136 137 134* 135 136  K 4.0 3.8 3.7 3.9 4.1  CL 102 102 101 102 103  CO2 23  --  23 25 25   GLUCOSE 168* 166* 196* 176* 150*  BUN 19 19 18 17 15   CREATININE 1.10 1.10 1.21 1.18 1.02  CALCIUM  8.8*  --  8.7* 8.6* 8.2*  MG  --   --  1.7 2.2 2.1  PHOS  --   --  2.6 2.7 2.9   GFR: Estimated Creatinine Clearance: 55.8 mL/min (by C-G formula based on SCr of 1.02 mg/dL). Recent Labs  Lab 08/15/24 1242 08/15/24 1245 08/16/24 0141 08/19/24 0350  WBC 12.2*  --  11.6* 8.9  LATICACIDVEN  --  1.1  --   --     Liver Function Tests: Recent Labs  Lab 08/15/24 1242  AST 10*  ALT 10  ALKPHOS 68  BILITOT 0.6  PROT 6.0*  ALBUMIN 2.9*   No results for input(s): LIPASE, AMYLASE in the last 168 hours. No results for input(s): AMMONIA in the last 168 hours.  ABG    Component Value Date/Time   TCO2 24 08/15/2024 1256     Coagulation Profile: No results for input(s): INR, PROTIME in the last 168 hours.  Cardiac Enzymes: No results for input(s): CKTOTAL, CKMB, CKMBINDEX, TROPONINI in the last 168 hours.  HbA1C: Hgb A1c MFr Bld  Date/Time Value Ref Range Status  06/21/2024 02:37 PM 7.0 (H) 4.6 - 6.5 % Final    Comment:    Glycemic Control Guidelines for People with Diabetes:Non Diabetic:  <6%Goal of Therapy: <7%Additional Action Suggested:  >8%   03/12/2024 10:58 AM 7.0 (H) 4.6 - 6.5 % Final    Comment:    Glycemic Control Guidelines for People with  Diabetes:Non Diabetic:  <6%Goal of Therapy: <7%Additional Action Suggested:  >8%     CBG: Recent Labs  Lab 08/18/24 0732 08/18/24 1127 08/18/24 1724 08/18/24 2107 08/19/24 0813  GLUCAP 147* 155* 176* 163* 165*    Review of Systems:   Not obtained  Past Medical History:  He,  has a past medical history of BPH (benign prostatic hyperplasia), Cataract, CKD (chronic kidney disease) stage 3, GFR 30-59 ml/min (HCC), DM (diabetes mellitus), type 2 (HCC), ED (erectile dysfunction), GERD (gastroesophageal reflux disease), History of adenomatous polyp of colon (11/02/2017), History of kidney stones, Hyperlipidemia, Hypertension, IBS (irritable bowel syndrome), Kidney stones (02/11/2020), LBBB (left bundle branch block), Lung nodule, Personal history of kidney stones, and Vitamin D  deficiency.  Surgical History:   Past Surgical History:  Procedure Laterality Date   CATARACT EXTRACTION Right 2015   CATARACT EXTRACTION W/ INTRAOCULAR LENS IMPLANT  2013   left   COLONOSCOPY  2013   TA   ENDOBRONCHIAL ULTRASOUND Bilateral 07/30/2024   Procedure: ENDOBRONCHIAL ULTRASOUND (EBUS);  Surgeon: Isadora Hose, MD;  Location: [facility] ORS;  Service: Pulmonary;  Laterality: Bilateral;   EXTRACORPOREAL SHOCK WAVE LITHOTRIPSY Left 12/11/2017   Procedure: LEFT EXTRACORPOREAL SHOCK WAVE LITHOTRIPSY (ESWL);  Surgeon: Carolee Sherwood JONETTA DOUGLAS, MD;  Location: [facility] OR[facility];  Service: Urology;  Laterality: Left;   KIDNEY STONE SURGERY     POLYPECTOMY     VIDEO BRONCHOSCOPY WITH ENDOBRONCHIAL ULTRASOUND Bilateral 07/18/2024   Procedure: BRONCHOSCOPY, WITH EBUS;  Surgeon: Catherine Cools, MD;  Location: [facility];  Service: Pulmonary;  Laterality: Bilateral;     Social History:   reports that he quit smoking about 56 years ago. His smoking use included cigarettes. He started smoking about 66 years ago. He has a 5 pack-year smoking history. He has been exposed to tobacco smoke. He has never used smokeless tobacco. He reports  that he does not drink alcohol  and does not use drugs.   Family History:  His family history includes Diabetes in his sister and sister; Heart disease in his mother; Hypertension in his sister and sister. There is no history of Colon cancer, Colon polyps, Esophageal cancer, Rectal cancer, or Stomach cancer.   Allergies Allergies  Allergen Reactions   Bystolic [Nebivolol Hcl] Other (See Comments)    Reaction not recalled     Home Medications  Prior to Admission medications   Medication Sig Start Date End Date Taking? Authorizing Provider  APPLE CIDER VINEGAR PO Take 1-2 tablets by mouth daily.   Yes [provider]  ascorbic acid (VITAMIN C) 500 MG tablet Take 500-1,000 mg by mouth daily.   Yes [provider]  aspirin  81 MG tablet Take 81 mg by mouth 3 (three) times a week.   Yes [provider]  atorvastatin  (LIPITOR) 40 MG tablet Take 1 tablet (40 mg total) by mouth every evening for cholesterol 05/30/24  Yes   Cholecalciferol (VITAMIN D3) 125 MCG (5000 UT) CAPS Take 5,000 Units by mouth daily.   Yes [provider]  CINNAMON PO Take 1-2 capsules by mouth daily.   Yes [provider]  Cyanocobalamin (VITAMIN B 12 PO) Take 1 tablet by mouth daily.   Yes [provider]  famotidine  (PEPCID ) 40 MG tablet Take 1 tablet (40 mg total) by mouth every evening to prevent heartburn and indigestion. Patient taking differently: Take 40 mg by mouth See admin instructions. Take 40 mg by mouth in the evening as needed for reflux or indigestion 06/27/24  Yes Alvia Corean CROME, FNP  fluticasone  (FLONASE ) 50 MCG/ACT nasal spray Place 1-2 sprays into both nostrils daily. Patient taking differently: Place 1-2 sprays into both nostrils daily as needed for allergies or rhinitis. 06/27/24  Yes Alvia Corean CROME, FNP  levofloxacin  (LEVAQUIN ) 750 MG tablet Take 1 tablet (750 mg total) by mouth daily. 08/14/24  Yes Kristin Barcus, MD  losartan  (COZAAR )  50 MG tablet Take 1 tablet (50 mg total) by mouth daily for blood pressure. 08/07/24  Yes Henson, Vickie L, NP-C  metFORMIN  (GLUCOPHAGE ) 500 MG tablet Take 1 tablet (500 mg total) by mouth 3 (three) times daily with meals for diabetes. 06/27/24  Yes Alvia Corean CROME, FNP  OMEGA-3 FATTY ACIDS PO Take 1 capsule by mouth 3 (  three) times a week.   Yes [provider]  pantoprazole  (PROTONIX ) 40 MG tablet Take 1 tablet (40 mg total) by mouth every evening to prevent heartburn and indigestion. Patient taking differently: Take 40 mg by mouth See admin instructions. Take 40 mg by mouth 30 minutes before breakfast as needed for reflux or indigestion 07/16/24  Yes Henson, Vickie L, NP-C  SYSTANE COMPLETE PF 0.6 % SOLN Place 1 drop into both eyes 3 (three) times daily as needed (for dryness or irritation).   Yes [provider]  tamsulosin  (FLOMAX ) 0.4 MG CAPS capsule Take 1 capsule (0.4 mg total) by mouth every evening for prostate. 06/27/24  Yes Alvia Corean CROME, FNP  tizanidine  (ZANAFLEX ) 2 MG capsule Take 1 capsule (2 mg total) by mouth 2 (two) times daily as needed for muscle spasms 06/27/24  Yes Alvia Corean CROME, FNP  zinc gluconate 50 MG tablet Take 50 mg by mouth daily.   Yes [provider]  albuterol  (ACCUNEB ) 1.25 MG/3ML nebulizer solution Inhale 3 mL (1.25 mg total) by nebulization every 6 (six) hours as needed for wheezing. Patient not taking: Reported on 08/15/2024 08/14/24   Catherine Cools, MD  albuterol  (VENTOLIN  HFA) 108 (90 Base) MCG/ACT inhaler Inhale 2 puffs into the lungs every 4 (four) hours as needed for wheezing or shortness of breath. Patient not taking: Reported on 08/15/2024 08/10/24   Iola Lukes, FNP  atorvastatin  (LIPITOR) 40 MG tablet Take 1 tablet (40 mg total) by mouth every evening for cholesterol. Patient not taking: Reported on 08/15/2024 06/27/24   Alvia Corean CROME, FNP  benzonatate  (TESSALON ) 100 MG capsule Take 1 capsule (100  mg total) by mouth 3 (three) times daily as needed for cough. Patient not taking: Reported on 08/15/2024 08/10/24   Iola Lukes, FNP  empagliflozin  (JARDIANCE ) 10 MG TABS tablet Take 1 tablet (10 mg total) by mouth daily. Patient not taking: Reported on 08/15/2024 08/07/24   [name], NP-C  erythromycin  ophthalmic ointment Apply ointment to eye sutures twice daily for 14 days Patient not taking: Reported on 08/15/2024 04/03/24     glucose blood (ONETOUCH VERIO) test strip USE TO TEST BLOOD SUGAR ONCE DAILY 06/27/24   Alvia Corean CROME, FNP  glucose blood test strip Use 1 strip to check glucose once daily. 06/27/24   Alvia Corean CROME, FNP  Tiotropium Bromide-Olodaterol (STIOLTO RESPIMAT ) 2.5-2.5 MCG/ACT AERS Inhale 2 puffs into the lungs daily. Patient not taking: Reported on 08/15/2024 08/15/24   Catherine Cools, MD     Thank you for this interesting consult. I have spent 40 minutes evaluating patient, reviewing chart, and discussing plan of care with patient, family, and primary medical team. If you have any questions or concerns please reach out to me via secure chat.  Cools Catherine, MD [facility]

## 2024-08-19 NOTE — Discharge Summary (Signed)
 Physician Discharge Summary   Patient: Edward [name] MRN: [id number] DOB: 08/18/1941  Admit date:     08/15/2024  Discharge date: 08/19/24  Discharge Physician: Toribio Door   PCP: [name], NP-C   Recommendations at discharge:  Multilobar postobstructive pneumonia on the right Small cell right-sided lung cancer, well-differentiated neuroendocrine tumor Acute hypoxic respiratory failure Continues to improve. Appreciate pulmonology.  Plan to complete 3 to 4 weeks of Augmentin . Follow-up with oncology as an outpatient Follow-up with pulmonology as an outpatient   Frequent PVCs NSVT Follow-up with cardiology as an outpatient   Chronic systolic CHF Elevated troponin Elevated BNP Can follow-up with cardiology as an outpatient.  Discharge Diagnoses: Principal Problem:   Multifocal pneumonia Active Problems:   Hyperlipidemia associated with type 2 diabetes mellitus (HCC)   Gastroesophageal reflux disease   Essential hypertension   BPH with obstruction/lower urinary tract symptoms   CKD stage 3 due to type 2 diabetes mellitus (HCC)   Primary malignant neuroendocrine neoplasm of lung (HCC)   Acute hypoxic respiratory failure (HCC) Frequent PVCs NSVT COPD CKD stage IIIa ruled out Diabetes mellitus type 2 Chronic systolic CHF Elevated troponin Elevated BNP  Hospital Course: 83 year old man recent diagnosis well-differentiated neuroendocrine tumor, seen by pulmonary in the office 12/4 and sent to the hospital for admission for postobstructive pneumonia, shortness of breath.  Consultants Pulmonology  Cardiology   Procedures/Events 12/4 admission  Multilobar postobstructive pneumonia on the right Small cell right-sided lung cancer, well-differentiated neuroendocrine tumor Acute hypoxic respiratory failure Continues to improve. Appreciate pulmonology.  Plan to complete 3 to 4 weeks of Augmentin . Follow-up with oncology as an outpatient Follow-up with  pulmonology as an outpatient Acute hypoxia resolved.  Seen by physical therapy.  No follow-up needed.   Frequent PVCs NSVT Electrolytes stable.  Uptitrate beta-blocker as tolerated.  No further evaluation per cardiology unless patient becomes symptomatic. Follow-up with cardiology as an outpatient   COPD Remains stable.  Continue triple nebulizer therapy Discussed with Dr. Alghanim, discharged on Stiolto and albuterol    CKD stage IIIa ruled out   Diabetes mellitus type 2 CBG remains stable.  Continue sliding scale insulin .  Resume metformin    Chronic systolic CHF Elevated troponin Elevated BNP Given reflux of contrast into hepatic veins suggesting cardiac dysfunction, echocardiogram showed LVEF 40-45% with no regional wall motion abnormalities.  Grade 1 diastolic dysfunction.  Normal RV systolic function Continue beta-blocker, uptitrate this first, if blood pressure can tolerate can consider adding further GDMT.  Follow clinically for now, can follow-up with cardiology as an outpatient.   Disposition: Home Diet recommendation:  Regular diet DISCHARGE MEDICATION: Allergies as of 08/19/2024       Reactions   Bystolic [nebivolol Hcl] Other (See Comments)   Reaction not recalled        Medication List     STOP taking these medications    benzonatate  100 MG capsule Commonly known as: TESSALON    empagliflozin  10 MG Tabs tablet Commonly known as: JARDIANCE    erythromycin  ophthalmic ointment   levofloxacin  750 MG tablet Commonly known as: LEVAQUIN    tizanidine  2 MG capsule Commonly known as: ZANAFLEX        TAKE these medications    albuterol  1.25 MG/3ML nebulizer solution Commonly known as: ACCUNEB  Inhale 3 mL (1.25 mg total) by nebulization every 6 (six) hours as needed for wheezing.   albuterol  108 (90 Base) MCG/ACT inhaler Commonly known as: VENTOLIN  HFA Inhale 2 puffs into the lungs every 4 (four) hours as needed for wheezing  or shortness of breath.    amoxicillin -clavulanate 875-125 MG tablet Commonly known as: AUGMENTIN  Take 1 tablet by mouth every 12 (twelve) hours for 28 days.   APPLE CIDER VINEGAR PO Take 1-2 tablets by mouth daily.   ascorbic acid 500 MG tablet Commonly known as: VITAMIN C Take 500-1,000 mg by mouth daily.   aspirin  81 MG tablet Take 81 mg by mouth 3 (three) times a week.   atorvastatin  40 MG tablet Commonly known as: LIPITOR Take 1 tablet (40 mg total) by mouth every evening for cholesterol What changed: Another medication with the same name was removed. Continue taking this medication, and follow the directions you see here.   CINNAMON PO Take 1-2 capsules by mouth daily.   famotidine  40 MG tablet Commonly known as: PEPCID  Take 1 tablet (40 mg total) by mouth every evening to prevent heartburn and indigestion. What changed:  when to take this additional instructions   fluticasone  50 MCG/ACT nasal spray Commonly known as: FLONASE  Place 1-2 sprays into both nostrils daily. What changed:  when to take this reasons to take this   losartan  50 MG tablet Commonly known as: COZAAR  Take 1 tablet (50 mg total) by mouth daily for blood pressure.   metFORMIN  500 MG tablet Commonly known as: GLUCOPHAGE  Take 1 tablet (500 mg total) by mouth 3 (three) times daily with meals for diabetes.   metoprolol  tartrate 25 MG tablet Commonly known as: LOPRESSOR  Take 0.5 tablets (12.5 mg total) by mouth 2 (two) times daily.   OMEGA-3 FATTY ACIDS PO Take 1 capsule by mouth 3 (three) times a week.   OneTouch Verio test strip Generic drug: glucose blood USE TO TEST BLOOD SUGAR ONCE DAILY   glucose blood test strip Use 1 strip to check glucose once daily.   pantoprazole  40 MG tablet Commonly known as: PROTONIX  Take 1 tablet (40 mg total) by mouth every evening to prevent heartburn and indigestion. What changed:  when to take this additional instructions   Stiolto Respimat  2.5-2.5 MCG/ACT Aers Generic  drug: Tiotropium Bromide-Olodaterol Inhale 2 puffs into the lungs daily.   Systane Complete PF 0.6 % Soln Generic drug: Propylene Glycol (PF) Place 1 drop into both eyes 3 (three) times daily as needed (for dryness or irritation).   tamsulosin  0.4 MG Caps capsule Commonly known as: FLOMAX  Take 1 capsule (0.4 mg total) by mouth every evening for prostate.   VITAMIN B 12 PO Take 1 tablet by mouth daily.   Vitamin D3 125 MCG (5000 UT) Caps Take 5,000 Units by mouth daily.   zinc gluconate 50 MG tablet Take 50 mg by mouth daily.               Durable Medical Equipment  (From admission, onward)           Start     Ordered   08/19/24 1306  For home use only DME Nebulizer machine  Once       Question Answer Comment  Patient needs a nebulizer to treat with the following condition COPD (chronic obstructive pulmonary disease) (HCC)   Length of Need Lifetime   Additional equipment included Administration kit   Additional equipment included Filter      08/19/24 1306            Follow-up Information     Henson, Vickie L, NP-C. Schedule an appointment as soon as possible for a visit in 1 week(s).   Specialty: Family Medicine Contact information: [address]  [place] [address] [phone]         [facility] A Dept of [employer] Follow up.   Specialty: Cardiology Why: Office will contact you with an appointment Contact information: [address] [place]  [address] [phone]        Catherine Cools, MD Follow up.   Specialties: Pulmonary Disease, Internal Medicine Why: Office will contact you with an appointment Contact information: [address], [address] [place] [address] [phone]                Feels better  Discharge Exam: Filed Weights   08/15/24 1647 08/18/24 1003 08/19/24 0500  Weight: 71.3 kg 70.9 kg 71.3 kg   Physical Exam Vitals reviewed.   Constitutional:      General: He is not in acute distress.    Appearance: He is not ill-appearing or toxic-appearing.  Cardiovascular:     Rate and Rhythm: Normal rate and regular rhythm.     Heart sounds: No murmur heard. Pulmonary:     Effort: Pulmonary effort is normal. No respiratory distress.     Breath sounds: No wheezing, rhonchi or rales.  Neurological:     Mental Status: He is alert.  Psychiatric:        Mood and Affect: Mood normal.        Behavior: Behavior normal.      Condition at discharge: good  The results of significant diagnostics from this hospitalization (including imaging, microbiology, ancillary and laboratory) are listed below for reference.   Imaging Studies: ECHOCARDIOGRAM COMPLETE Result Date: 08/17/2024    ECHOCARDIOGRAM REPORT   Patient Name:   Morton R Dominik Date of Exam: 08/17/2024 Medical Rec #:  [id number]         Height:       69.0 in Accession #:    [id number]        Weight:       157.2 lb Date of Birth:  [birth year]         BSA:          1.865 m Patient Age:    83 years          BP:           110/70 mmHg Patient Gender: M                 HR:           79 bpm. Exam Location:  Inpatient Procedure: 2D Echo, Intracardiac Opacification Agent, Color Doppler and Cardiac            Doppler (Both Spectral and Color Flow Doppler were utilized during            procedure). Indications:    Dyspnea R06.00  History:        Patient has no prior history of Echocardiogram examinations.                 Risk Factors:Diabetes, Hypertension and Dyslipidemia.  Sonographer:    Tinnie Gosling RDCS Referring Phys: 8 Manroop Jakubowicz P Chaise Mahabir IMPRESSIONS  1. Left ventricular ejection fraction, by estimation, is 40 to 45%. The left ventricle has mildly decreased function. The left ventricle has no regional wall motion abnormalities. There is mild left ventricular hypertrophy. Left ventricular diastolic parameters are consistent with Grade I diastolic dysfunction (impaired relaxation).  2.  Right ventricular systolic function is normal. The right ventricular size is normal.  3. Left atrial size was moderately  dilated.  4. A small pericardial effusion is present. The pericardial effusion is anterior to the right ventricle and localized near the right ventricle.  5. The mitral valve is abnormal. Trivial mitral valve regurgitation. No evidence of mitral stenosis.  6. The aortic valve is tricuspid. There is mild calcification of the aortic valve. There is mild thickening of the aortic valve. Aortic valve regurgitation is not visualized. Aortic valve sclerosis is present, with no evidence of aortic valve stenosis.  7. The inferior vena cava is dilated in size with >50% respiratory variability, suggesting right atrial pressure of 8 mmHg. FINDINGS  Left Ventricle: Abnormal septal motion and ventricular ectopy during study. Left ventricular ejection fraction, by estimation, is 40 to 45%. The left ventricle has mildly decreased function. The left ventricle has no regional wall motion abnormalities. Definity  contrast agent was given IV to delineate the left ventricular endocardial borders. Strain was performed and the global longitudinal strain is indeterminate. The left ventricular internal cavity size was normal in size. There is mild left ventricular hypertrophy. Left ventricular diastolic parameters are consistent with Grade I diastolic dysfunction (impaired relaxation). Right Ventricle: The right ventricular size is normal. No increase in right ventricular wall thickness. Right ventricular systolic function is normal. Left Atrium: Left atrial size was moderately dilated. Right Atrium: Right atrial size was normal in size. Pericardium: A small pericardial effusion is present. The pericardial effusion is anterior to the right ventricle and localized near the right ventricle. Mitral Valve: The mitral valve is abnormal. There is mild thickening of the mitral valve leaflet(s). Trivial mitral valve regurgitation.  No evidence of mitral valve stenosis. Tricuspid Valve: The tricuspid valve is normal in structure. Tricuspid valve regurgitation is mild . No evidence of tricuspid stenosis. Aortic Valve: The aortic valve is tricuspid. There is mild calcification of the aortic valve. There is mild thickening of the aortic valve. Aortic valve regurgitation is not visualized. Aortic valve sclerosis is present, with no evidence of aortic valve stenosis. Pulmonic Valve: The pulmonic valve was normal in structure. Pulmonic valve regurgitation is trivial. No evidence of pulmonic stenosis. Aorta: The aortic root is normal in size and structure. Venous: The inferior vena cava is dilated in size with greater than 50% respiratory variability, suggesting right atrial pressure of 8 mmHg. IAS/Shunts: No atrial level shunt detected by color flow Doppler. Additional Comments: 3D was performed not requiring image post processing on an independent workstation and was indeterminate.  LEFT VENTRICLE PLAX 2D LVIDd:         5.60 cm      Diastology LVIDs:         4.30 cm      LV e' medial:    4.90 cm/s LV PW:         1.20 cm      LV E/e' medial:  10.7 LV IVS:        1.30 cm      LV e' lateral:   7.29 cm/s LVOT diam:     2.30 cm      LV E/e' lateral: 7.2 LV SV:         64 LV SV Index:   34 LVOT Area:     4.15 cm  LV Volumes (MOD) LV vol d, MOD A2C: 133.0 ml LV vol d, MOD A4C: 114.0 ml LV vol s, MOD A2C: 81.1 ml LV vol s, MOD A4C: 80.3 ml LV SV MOD A2C:     51.9 ml LV SV MOD A4C:  114.0 ml LV SV MOD BP:      47.7 ml RIGHT VENTRICLE             IVC RV S prime:     17.60 cm/s  IVC diam: 2.70 cm LEFT ATRIUM             Index        RIGHT ATRIUM           Index LA diam:        4.30 cm 2.31 cm/m   RA Area:     12.90 cm LA Vol (A2C):   56.6 ml 30.35 ml/m  RA Volume:   29.50 ml  15.82 ml/m LA Vol (A4C):   43.6 ml 23.38 ml/m LA Biplane Vol: 50.7 ml 27.19 ml/m  AORTIC VALVE LVOT Vmax:   74.60 cm/s LVOT Vmean:  49.000 cm/s LVOT VTI:    0.153 m  AORTA Ao  Root diam: 3.60 cm Ao Asc diam:  3.70 cm MITRAL VALVE               TRICUSPID VALVE MV Area (PHT): 2.86 cm    TR Peak grad:   24.4 mmHg MV E velocity: 52.30 cm/s  TR Vmax:        247.00 cm/s MV A velocity: 76.30 cm/s MV E/A ratio:  0.69        SHUNTS                            Systemic VTI:  0.15 m                            Systemic Diam: 2.30 cm Maude Emmer MD Electronically signed by Maude Emmer MD Signature Date/Time: 08/17/2024/11:25:20 AM    Final    DG Chest 2 View Result Date: 08/15/2024 CLINICAL DATA:  post bronch. rule out pneumothorax EXAM: CHEST - 2 VIEW COMPARISON:  July 30, 2024 FINDINGS: Increasing airspace consolidation in the right lower lobe. Small pleural effusion on the right. No pneumothorax. The right infrahilar lung mass is less conspicuous on today's radiograph due to the airspace disease in the right lung base. Mild cardiomegaly. Aortic atherosclerosis. No acute fracture or destructive lesions. Multilevel thoracic osteophytosis. IMPRESSION: Increasing airspace consolidation in the right lower lobe, possibly worsening atelectasis or airspace disease given the patient's right infrahilar lung mass. Small right pleural effusion. Electronically Signed   By: Rogelia Myers M.D.   On: 08/15/2024 15:29   CT Angio Chest PE W and/or Wo Contrast Result Date: 08/15/2024 CLINICAL DATA:  Pulmonary embolism (PE) suspected, low to intermediate prob, positive D-dimer EXAM: CT ANGIOGRAPHY CHEST WITH CONTRAST TECHNIQUE: Multidetector CT imaging of the chest was performed using the standard protocol during bolus administration of intravenous contrast. Multiplanar CT image reconstructions and MIPs were obtained to evaluate the vascular anatomy. RADIATION DOSE REDUCTION: This exam was performed according to the departmental dose-optimization program which includes automated exposure control, adjustment of the mA and/or kV according to patient size and/or use of iterative reconstruction technique.  CONTRAST:  75mL OMNIPAQUE  IOHEXOL  350 MG/ML SOLN COMPARISON:  08/14/2024, 07/22/2024, 07/02/2024 FINDINGS: Pulmonary Embolism: No pulmonary embolism. Cardiovascular: Mild cardiomegaly. Moderate volume pericardial effusion.No aortic aneurysm. Mediastinum/Nodes: No mediastinal mass.Subcarinal lymph node measuring 1.4 cm (axial 76). Unchanged enlarged lower left paratracheal lymph node. Lungs/Pleura: The midline trachea and bronchi are patent. Redemonstration of a right infrahilar mass, which measures 6 x  7.7 x 6.4 cm (previously, 5.7 x 6.3 x 5.6 cm). The mass is again noted to completely obstruct the right middle lobe bronchus with severe to near complete narrowing of the right lower lobe bronchi. The mass also significantly narrows the right interlobar pulmonary artery in the right middle lobe pulmonary artery with complete occlusion of the superior right pulmonary vein. Interval development of dense airspace consolidation filling the majority of the right lower lobe with unchanged, but significant atelectasis of the right middle lobe. No pneumothorax. Small right pleural effusion. Musculoskeletal: No acute fracture. Similar lucent lesion in the right scapula measuring 1.3 cm, which was not FDG avid on the prior PET-CT. Multilevel degenerative disc disease of the spine. Thoracic DISH. Upper Abdomen: No acute abnormality in the partially visualized upper abdomen. Reflux of contrast into the hepatic veins. Review of the MIP images confirms the above findings. IMPRESSION: 1. No pulmonary embolism. 2. Enlarging right infrahilar mass, as measured above. This causes complete obstruction of the right middle lobe bronchus with severe to near-complete narrowing of the right lower lobe bronchi. New dense airspace consolidation filling the majority of the right lower lobe. While this may represent right lower lobe atelectasis, it would be worrisome for postobstructive pneumonia, in the correct clinical context. Small right  pleural effusion. 3. Reflux of contrast into the hepatic veins is suggestive of underlying cardiac dysfunction. 4. Redemonstrated multistation mediastinal lymph nodes, with a more prominent subcarinal lymph node, concerning for worsening metastatic disease. Aortic Atherosclerosis (ICD10-I70.0). Electronically Signed   By: Rogelia Myers M.D.   On: 08/15/2024 15:26   VAS US  LOWER EXTREMITY VENOUS (DVT) (ONLY [facility] & [facility]) Result Date: 08/15/2024  Lower Venous DVT Study Patient Name:  Kamarian R Garbutt  Date of Exam:   08/15/2024 Medical Rec #: [id number]          Accession #:    [id number] Date of Birth: 04/22/1941          Patient Gender: M Patient Age:   29 years Exam Location:  [facility] Procedure:      VAS US  LOWER EXTREMITY VENOUS (DVT) Referring Phys: MAUDE MESSICK --------------------------------------------------------------------------------  Indications: SOB. Other Indications: History of lung cancer. Risk Factors: COPD. Comparison Study: No priors. Performing Technologist: Ricka Sturdivant-Jones RDMS, RVT  Examination Guidelines: A complete evaluation includes B-mode imaging, spectral Doppler, color Doppler, and power Doppler as needed of all accessible portions of each vessel. Bilateral testing is considered an integral part of a complete examination. Limited examinations for reoccurring indications may be performed as noted. The reflux portion of the exam is performed with the patient in reverse Trendelenburg.  +---------+---------------+---------+-----------+----------+--------------+ RIGHT    CompressibilityPhasicitySpontaneityPropertiesThrombus Aging +---------+---------------+---------+-----------+----------+--------------+ CFV      Full           Yes      Yes                                 +---------+---------------+---------+-----------+----------+--------------+ SFJ      Full                                                         +---------+---------------+---------+-----------+----------+--------------+ FV Prox  Full                                                        +---------+---------------+---------+-----------+----------+--------------+  FV Mid   Full           Yes      Yes                                 +---------+---------------+---------+-----------+----------+--------------+ FV DistalFull                                                        +---------+---------------+---------+-----------+----------+--------------+ PFV      Full                                                        +---------+---------------+---------+-----------+----------+--------------+ POP      Full           Yes      Yes                                 +---------+---------------+---------+-----------+----------+--------------+ PTV      Full                                                        +---------+---------------+---------+-----------+----------+--------------+ PERO     Full                                                        +---------+---------------+---------+-----------+----------+--------------+   +---------+---------------+---------+-----------+----------+--------------+ LEFT     CompressibilityPhasicitySpontaneityPropertiesThrombus Aging +---------+---------------+---------+-----------+----------+--------------+ CFV      Full           Yes      Yes                                 +---------+---------------+---------+-----------+----------+--------------+ SFJ      Full                                                        +---------+---------------+---------+-----------+----------+--------------+ FV Prox  Full                                                        +---------+---------------+---------+-----------+----------+--------------+ FV Mid   Full           No       Yes                                  +---------+---------------+---------+-----------+----------+--------------+  FV DistalFull                                                        +---------+---------------+---------+-----------+----------+--------------+ PFV      Full                                                        +---------+---------------+---------+-----------+----------+--------------+ POP      Full           Yes      Yes                                 +---------+---------------+---------+-----------+----------+--------------+ PTV      Full                                                        +---------+---------------+---------+-----------+----------+--------------+ PERO     Full                                                        +---------+---------------+---------+-----------+----------+--------------+     Summary: BILATERAL: - No evidence of deep vein thrombosis seen in the lower extremities, bilaterally. -No evidence of popliteal cyst, bilaterally.   *See table(s) above for measurements and observations. Electronically signed by Debby Robertson on 08/15/2024 at 2:26:10 PM.    Final    DG Chest Port 1 View Result Date: 07/30/2024 EXAM: 1 VIEW(S) XRAY OF THE CHEST 07/30/2024 11:08:51 AM COMPARISON: 06/27/2024 CLINICAL HISTORY: S/P Bronchoscopy FINDINGS: LUNGS AND PLEURA: Right lower lobe infrahilar masslike fullness persists. Low lung volumes with crowding of vasculature and potentially atelectasis in the left lower lobe. Previous blunting of the right lateral costophrenic angle has resolved. No pleural effusion. No pneumothorax. HEART AND MEDIASTINUM: No acute abnormality of the cardiac and mediastinal silhouettes. BONES AND SOFT TISSUES: Thoracic spondylosis. No acute osseous abnormality. IMPRESSION: 1. Right lower lobe infrahilar masslike fullness persists. 2. Low lung volumes with crowding of vasculature and probable left lower lobe atelectasis. 3. Thoracic spondylosis. Electronically  signed by: Ryan Salvage MD 07/30/2024 11:31 AM EST RP Workstation: [id number]   MR BRAIN W WO CONTRAST Result Date: 07/26/2024 EXAM: MRI BRAIN WITH AND WITHOUT CONTRAST 07/22/2024 01:21:28 PM TECHNIQUE: Multiplanar multisequence MRI of the head/brain was performed with and without the administration of 7 mL gadobutrol  (GADAVIST ) 1 MMOL/ML injection. COMPARISON: None available. CLINICAL HISTORY: Non-small cell lung cancer (NSCLC), staging. FINDINGS: BRAIN AND VENTRICLES: There is age-related cerebral volume loss present and there is mild-to-moderate cerebral white matter disease. There are no findings concerning for metastatic disease. No acute infarct. No acute intracranial hemorrhage. No mass effect or midline shift. No hydrocephalus. The sella is unremarkable. Normal flow voids. No mass or abnormal enhancement. ORBITS: No acute abnormality. SINUSES: No acute abnormality. BONES AND SOFT TISSUES:  Normal bone marrow signal and enhancement. No acute soft tissue abnormality. IMPRESSION: 1. No acute intracranial abnormality. 2. No findings concerning for metastatic disease. 3. Age-related cerebral volume loss and mild-to-moderate cerebral white matter disease. Electronically signed by: Evalene Coho MD 07/26/2024 08:58 AM EST RP Workstation: [id number]   NM PET Image Initial (PI) Skull Base To Thigh (F-18 FDG) Result Date: 07/24/2024 CLINICAL DATA:  Initial treatment strategy for lung nodule. EXAM: NUCLEAR MEDICINE PET SKULL BASE TO THIGH TECHNIQUE: 8.7 mCi F-18 FDG was injected intravenously. Full-ring PET imaging was performed from the skull base to thigh after the radiotracer. CT data was obtained and used for attenuation correction and anatomic localization. Fasting blood glucose: 125 mg/dl COMPARISON:  CT chest abdomen pelvis 07/02/2024. FINDINGS: Mediastinal blood pool activity: SUV max 2.1 Liver activity: SUV max NA NECK: No abnormal hypermetabolism. Incidental CT findings: None. CHEST: A mildly  hypermetabolic mass is seen predominantly in the perihilar right middle lobe with some involvement of the superior segment right lower lobe, 5.6 x 6.2 cm (6/67), SUV max 3.2. Right hilar lymph node measures 8 mm, SUV max 2.6. Low left paratracheal lymph nodes measure up to 12 mm (6/55), SUV max 2.9. Low left internal jugular lymph node measures 7 mm, SUV max 2.2. Incidental CT findings: Atherosclerotic calcification of the aorta and coronary arteries. Heart is enlarged. Small pericardial effusion. Aforementioned mass obstructs the right middle lobe bronchus with associated collapse of the right middle lobe. Associated narrowing of the left lower lobe bronchus. Small right pleural effusion. ABDOMEN/PELVIS: Focal anorectal hypermetabolism, SUV max 9.4. No additional abnormal hypermetabolism. Incidental CT findings: There may be sludge in the gallbladder. Low-attenuation lesions in the kidneys. No specific follow-up necessary. Small right renal stone. Tiny hiatal hernia. Left inguinal hernia contains a knuckle of sigmoid colon. SKELETON: No abnormal hypermetabolism. Incidental CT findings: Bilateral L5 pars defects with severe grade 1 or mild grade 2 anterolisthesis of L5 on S1. Degenerative changes in the spine. IMPRESSION: 1. 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 2. Focal anorectal hypermetabolism, indeterminate. Malignancy cannot be excluded. 3. Small pericardial effusion. 4. Small right pleural effusion. 5. Postobstructive right middle lobe collapse. 6. Small right renal stone. 7. Bilateral L5 pars defects with severe grade 1 or mild grade 2 anterolisthesis. 8. Aortic atherosclerosis (ICD10-I70.0). Coronary artery calcification. Electronically Signed   By: Newell Eke M.D.   On: 07/24/2024 09:35     Microbiology: Results for orders placed or performed during the hospital encounter of 08/15/24  Culture, blood (routine x 2)     Status: None (Preliminary result)   Collection Time: 08/15/24 12:36 PM   Specimen: BLOOD  Result Value Ref Range Status   Specimen Description   Final    BLOOD RIGHT ANTECUBITAL Performed at [facility], [address], [place], [place] [address]    Special Requests   Final    BOTTLES DRAWN AEROBIC AND ANAEROBIC Blood Culture adequate volume Performed at [facility], [address] [place], [place] [address]    Culture   Final    NO GROWTH 4 DAYS Performed at [facility], [address], [place], [place] [address]    Report Status PENDING  Incomplete  Culture, blood (routine x 2)     Status: None (Preliminary result)   Collection Time: 08/15/24 12:39 PM   Specimen: BLOOD  Result Value Ref Range Status   Specimen Description   Final    BLOOD LEFT ANTECUBITAL Performed at [facility], [address], [place], [place] [address]    Special Requests   Final    BOTTLES DRAWN AEROBIC AND ANAEROBIC Blood Culture adequate volume Performed at [facility], [address] [place], [place] [address]    Culture   Final    NO GROWTH 4 DAYS Performed at [facility], [address], [place], [place] [address]    Report Status PENDING  Incomplete  Resp panel by RT-PCR (RSV, Flu A&B, Covid)     Status: None   Collection Time: 08/15/24 12:43 PM   Specimen: Nasal Swab  Result Value Ref Range Status   SARS Coronavirus 2 by RT PCR NEGATIVE NEGATIVE Final    Comment: (NOTE) SARS-CoV-2 target nucleic acids are NOT DETECTED.  The SARS-CoV-2 RNA is generally detectable in upper respiratory specimens during the acute phase of infection. The lowest concentration of SARS-CoV-2 viral copies this assay can detect is 138 copies/mL. A negative result does not preclude  SARS-Cov-2 infection and should not be used as the sole basis for treatment or other patient management decisions. A negative result may occur with  improper specimen collection/handling, submission of specimen other than nasopharyngeal swab, presence of viral mutation(s) within the areas targeted by this assay, and inadequate number of viral copies(<138 copies/mL). A negative result must be combined with clinical observations, patient history, and epidemiological information. The expected result is Negative.  Fact Sheet for Patients:  bloggercourse.com  Fact Sheet for Healthcare Providers:  seriousbroker.it  This test is no t yet approved or cleared by the [place]  FDA and  has been authorized for detection and/or diagnosis of SARS-CoV-2 by FDA under an Emergency Use Authorization (EUA). This EUA will remain  in effect (meaning this test can be used) for the duration of the COVID-19 declaration under Section 564(b)(1) of the Act, 21 U.S.C.section 360bbb-3(b)(1), unless the authorization is terminated  or revoked sooner.       Influenza A by PCR NEGATIVE NEGATIVE Final   Influenza B by PCR NEGATIVE NEGATIVE Final    Comment: (NOTE) The Xpert Xpress SARS-CoV-2/FLU/RSV plus assay is intended as an aid in the diagnosis of influenza from Nasopharyngeal swab specimens and should not be used as a sole basis for treatment. Nasal washings and aspirates are unacceptable for Xpert Xpress SARS-CoV-2/FLU/RSV testing.  Fact Sheet for Patients: bloggercourse.com  Fact Sheet for Healthcare Providers: seriousbroker.it  This test is not yet approved or cleared by the [place]  FDA and has been authorized for detection and/or diagnosis of SARS-CoV-2 by FDA under an Emergency Use Authorization (EUA). This EUA will remain in effect (meaning this test can be used) for the duration of  the COVID-19 declaration under Section 564(b)(1) of the Act, 21 U.S.C. section 360bbb-3(b)(1), unless the authorization is terminated or revoked.     Resp Syncytial Virus by PCR NEGATIVE NEGATIVE Final    Comment: (NOTE) Fact Sheet for Patients: bloggercourse.com  Fact Sheet for Healthcare Providers: seriousbroker.it  This test is not yet approved or cleared by the [place]  FDA and has been authorized for detection and/or diagnosis of SARS-CoV-2 by FDA under an Emergency Use Authorization (EUA). This EUA will remain in effect (meaning this test can be used) for the duration of the COVID-19 declaration under Section 564(b)(1) of the Act, 21 U.S.C. section 360bbb-3(b)(1), unless the authorization is terminated or revoked.  Performed at [facility], [address], [place], [place] [address]   MRSA Next Gen by PCR, Nasal     Status: None   Collection Time: 08/15/24  3:11 PM   Specimen: Nasal Mucosa; Nasal Swab  Result Value Ref Range Status   MRSA by PCR Next Gen NOT DETECTED NOT DETECTED Final    Comment: (NOTE) The GeneXpert MRSA Assay (FDA approved for NASAL specimens only), is one component of a comprehensive MRSA colonization surveillance program. It is not intended to diagnose MRSA infection nor to guide or monitor treatment for MRSA infections. Test performance is not FDA approved in patients less than 21 years old. Performed at [facility], [address], [place], [place] [address]     Labs: CBC: Recent Labs  Lab 08/15/24 1242 08/15/24 1256 08/16/24 0141 08/19/24 0350  WBC 12.2*  --  11.6* 8.9  NEUTROABS 10.8*  --   --  7.5  HGB 13.1 13.3 11.8* 11.9*  HCT 39.5 39.0 35.3* 36.9*  MCV 87.6  --  88.0 89.6  PLT 218  --  229 290   Basic Metabolic Panel: Recent Labs  Lab 08/15/24 1242 08/15/24 1256 08/16/24 0141 08/17/24 0941 08/18/24 0423  NA 136 137 134* 135  136  K 4.0 3.8 3.7 3.9 4.1  CL 102 102 101 102 103  CO2 23  --  23 25 25   GLUCOSE 168* 166* 196* 176* 150*  BUN 19 19 18 17 15   CREATININE 1.10 1.10 1.21 1.18 1.02  CALCIUM  8.8*  --  8.7* 8.6* 8.2*  MG  --   --  1.7 2.2 2.1  PHOS  --   --  2.6 2.7 2.9   Liver Function Tests: Recent Labs  Lab 08/15/24 1242  AST 10*  ALT 10  ALKPHOS 68  BILITOT 0.6  PROT 6.0*  ALBUMIN 2.9*   CBG: Recent Labs  Lab 08/18/24 1127 08/18/24 1724 08/18/24 2107 08/19/24 0813 08/19/24 1212  GLUCAP 155* 176* 163* 165* 141*    Discharge time spent: greater than 30 minutes.  Signed: Toribio Door, MD [facility] 08/19/2024

## 2024-08-20 ENCOUNTER — Ambulatory Visit
Admission: RE | Admit: 2024-08-20 | Discharge: 2024-08-20 | Disposition: A | Source: Ambulatory Visit | Attending: Radiation Oncology | Admitting: Radiation Oncology

## 2024-08-20 ENCOUNTER — Ambulatory Visit
Admission: RE | Admit: 2024-08-20 | Discharge: 2024-08-20 | Disposition: A | Discharge status: Home / Self Care | Source: Ambulatory Visit | Attending: Radiation Oncology | Admitting: Radiation Oncology

## 2024-08-20 ENCOUNTER — Other Ambulatory Visit (HOSPITAL_COMMUNITY): Payer: Self-pay

## 2024-08-20 ENCOUNTER — Other Ambulatory Visit: Payer: Self-pay

## 2024-08-20 ENCOUNTER — Telehealth: Payer: Self-pay

## 2024-08-20 DIAGNOSIS — Z5111 Encounter for antineoplastic chemotherapy: Secondary | ICD-10-CM | POA: Diagnosis present

## 2024-08-20 DIAGNOSIS — C7A090 Malignant carcinoid tumor of the bronchus and lung: Secondary | ICD-10-CM | POA: Diagnosis not present

## 2024-08-20 DIAGNOSIS — Z510 Encounter for antineoplastic radiation therapy: Secondary | ICD-10-CM | POA: Diagnosis present

## 2024-08-20 DIAGNOSIS — T451X5A Adverse effect of antineoplastic and immunosuppressive drugs, initial encounter: Secondary | ICD-10-CM | POA: Diagnosis not present

## 2024-08-20 DIAGNOSIS — Z79899 Other long term (current) drug therapy: Secondary | ICD-10-CM | POA: Diagnosis not present

## 2024-08-20 DIAGNOSIS — R112 Nausea with vomiting, unspecified: Secondary | ICD-10-CM | POA: Diagnosis not present

## 2024-08-20 DIAGNOSIS — C7A1 Malignant poorly differentiated neuroendocrine tumors: Secondary | ICD-10-CM | POA: Diagnosis present

## 2024-08-20 LAB — CULTURE, FUNGUS WITHOUT SMEAR

## 2024-08-20 LAB — RAD ONC ARIA SESSION SUMMARY
Course Elapsed Days: 4
Plan Fractions Treated to Date: 3
Plan Prescribed Dose Per Fraction: 2 Gy
Plan Total Fractions Prescribed: 30
Plan Total Prescribed Dose: 60 Gy
Reference Point Dosage Given to Date: 6 Gy
Reference Point Session Dosage Given: 2 Gy
Session Number: 3

## 2024-08-20 LAB — CULTURE, BLOOD (ROUTINE X 2)
Culture: NO GROWTH
Culture: NO GROWTH
Special Requests: ADEQUATE
Special Requests: ADEQUATE

## 2024-08-20 NOTE — Telephone Encounter (Signed)
 Attempted to reach pt regarding his appts we scheduled for him on Thursday 08-22-24.  Left voicemail.

## 2024-08-21 ENCOUNTER — Ambulatory Visit
Admission: RE | Admit: 2024-08-21 | Discharge: 2024-08-21 | Attending: Radiation Oncology | Admitting: Radiation Oncology

## 2024-08-21 ENCOUNTER — Other Ambulatory Visit: Payer: Self-pay

## 2024-08-21 DIAGNOSIS — Z5111 Encounter for antineoplastic chemotherapy: Secondary | ICD-10-CM | POA: Diagnosis not present

## 2024-08-21 LAB — RAD ONC ARIA SESSION SUMMARY
Course Elapsed Days: 5
Plan Fractions Treated to Date: 4
Plan Prescribed Dose Per Fraction: 2 Gy
Plan Total Fractions Prescribed: 30
Plan Total Prescribed Dose: 60 Gy
Reference Point Dosage Given to Date: 8 Gy
Reference Point Session Dosage Given: 2 Gy
Session Number: 4

## 2024-08-22 ENCOUNTER — Inpatient Hospital Stay: Attending: Internal Medicine

## 2024-08-22 ENCOUNTER — Ambulatory Visit
Admission: RE | Admit: 2024-08-22 | Discharge: 2024-08-22 | Attending: Radiation Oncology | Admitting: Radiation Oncology

## 2024-08-22 ENCOUNTER — Other Ambulatory Visit: Payer: Self-pay

## 2024-08-22 ENCOUNTER — Encounter (HOSPITAL_BASED_OUTPATIENT_CLINIC_OR_DEPARTMENT_OTHER)

## 2024-08-22 ENCOUNTER — Encounter: Payer: Self-pay | Admitting: Internal Medicine

## 2024-08-22 ENCOUNTER — Inpatient Hospital Stay: Attending: Internal Medicine | Admitting: Internal Medicine

## 2024-08-22 ENCOUNTER — Other Ambulatory Visit (HOSPITAL_COMMUNITY): Payer: Self-pay

## 2024-08-22 VITALS — BP 132/80 | HR 71 | Temp 97.8°F | Resp 17 | Ht 69.0 in | Wt 157.9 lb

## 2024-08-22 DIAGNOSIS — C7A8 Other malignant neuroendocrine tumors: Secondary | ICD-10-CM | POA: Diagnosis not present

## 2024-08-22 DIAGNOSIS — Z5111 Encounter for antineoplastic chemotherapy: Secondary | ICD-10-CM | POA: Diagnosis not present

## 2024-08-22 LAB — CBC WITH DIFFERENTIAL (CANCER CENTER ONLY)
Abs Immature Granulocytes: 0.06 K/uL (ref 0.00–0.07)
Basophils Absolute: 0.1 K/uL (ref 0.0–0.1)
Basophils Relative: 1 %
Eosinophils Absolute: 0.1 K/uL (ref 0.0–0.5)
Eosinophils Relative: 1 %
HCT: 41.4 % (ref 39.0–52.0)
Hemoglobin: 13.3 g/dL (ref 13.0–17.0)
Immature Granulocytes: 1 %
Lymphocytes Relative: 10 %
Lymphs Abs: 0.8 K/uL (ref 0.7–4.0)
MCH: 28.3 pg (ref 26.0–34.0)
MCHC: 32.1 g/dL (ref 30.0–36.0)
MCV: 88.1 fL (ref 80.0–100.0)
Monocytes Absolute: 0.5 K/uL (ref 0.1–1.0)
Monocytes Relative: 6 %
Neutro Abs: 6.9 K/uL (ref 1.7–7.7)
Neutrophils Relative %: 81 %
Platelet Count: 398 K/uL (ref 150–400)
RBC: 4.7 MIL/uL (ref 4.22–5.81)
RDW: 13.6 % (ref 11.5–15.5)
WBC Count: 8.5 K/uL (ref 4.0–10.5)
nRBC: 0 % (ref 0.0–0.2)

## 2024-08-22 LAB — RAD ONC ARIA SESSION SUMMARY
Course Elapsed Days: 6
Plan Fractions Treated to Date: 5
Plan Prescribed Dose Per Fraction: 2 Gy
Plan Total Fractions Prescribed: 30
Plan Total Prescribed Dose: 60 Gy
Reference Point Dosage Given to Date: 10 Gy
Reference Point Session Dosage Given: 2 Gy
Session Number: 5

## 2024-08-22 LAB — CMP (CANCER CENTER ONLY)
ALT: 23 U/L (ref 0–44)
AST: 21 U/L (ref 15–41)
Albumin: 3.3 g/dL — ABNORMAL LOW (ref 3.5–5.0)
Alkaline Phosphatase: 78 U/L (ref 38–126)
Anion gap: 10 (ref 5–15)
BUN: 15 mg/dL (ref 8–23)
CO2: 25 mmol/L (ref 22–32)
Calcium: 8.8 mg/dL — ABNORMAL LOW (ref 8.9–10.3)
Chloride: 105 mmol/L (ref 98–111)
Creatinine: 1.08 mg/dL (ref 0.61–1.24)
GFR, Estimated: >60 mL/min (ref >60)
Glucose, Bld: 173 mg/dL — ABNORMAL HIGH (ref 70–99)
Potassium: 4.2 mmol/L (ref 3.5–5.1)
Sodium: 140 mmol/L (ref 135–145)
Total Bilirubin: 0.3 mg/dL (ref 0.0–1.2)
Total Protein: 6.6 g/dL (ref 6.5–8.1)

## 2024-08-22 MED ORDER — PROCHLORPERAZINE MALEATE 10 MG PO TABS
10.0000 mg | ORAL_TABLET | Freq: Four times a day (QID) | ORAL | 1 refills | Status: DC | PRN
  Filled 2024-08-22: qty 30, 8d supply, fill #0

## 2024-08-22 MED ORDER — LIDOCAINE-PRILOCAINE 2.5-2.5 % EX CREA
TOPICAL_CREAM | CUTANEOUS | 3 refills | Status: DC
  Filled 2024-08-22: qty 30, 30d supply, fill #0

## 2024-08-22 MED ORDER — ONDANSETRON HCL 8 MG PO TABS
8.0000 mg | ORAL_TABLET | Freq: Three times a day (TID) | ORAL | 1 refills | Status: DC | PRN
  Filled 2024-08-22: qty 30, 10d supply, fill #0

## 2024-08-22 NOTE — Progress Notes (Signed)
 START ON PATHWAY REGIMEN - Small Cell Lung     A cycle is every 21 days:     Carboplatin      Etoposide   **Always confirm dose/schedule in your pharmacy ordering system**  Patient Characteristics: Newly Diagnosed, Preoperative or Nonsurgical Candidate (Clinical Staging), First Line, Limited Stage, Nonsurgical Candidate Therapeutic Status: Newly Diagnosed, Preoperative or Nonsurgical Candidate (Clinical Staging) Check here if patient was staged using an edition other than AJCC Staging 9th Edition: false AJCC T Category: cT3 AJCC N Category: cN3 AJCC M Category: cM0 AJCC 9 Stage Grouping: IIIC Stage Classification: Limited Surgical Candidacy: Nonsurgical Candidate Intent of Therapy: Curative Intent, Discussed with Patient

## 2024-08-22 NOTE — Progress Notes (Signed)
 [facility] [phone]   [phone]  OFFICE PROGRESS NOTE  [name], NP-C [address] [place] [address]  DIAGNOSIS:  Stage IIIC (T3, N3, M0) non-small cell lung cancer, well-differentiated neuroendocrine tumor, suspicious for atypical carcinoid presented with right suprahilar mass with occlusion of the right middle lobe and constriction of the right lower lobe bronchus in addition to hypermetabolic lymph node extending to the contralateral mediastinum and possibly left internal jugular station.  Diagnosed in November 2025  PRIOR THERAPY: None  CURRENT THERAPY: Systemic chemotherapy with carboplatin for AUC of 5 on day 1 and Doutova side 100 mg/M2 on days 1, 2 and 3 every 3 weeks concurrent with radiotherapy.  First dose August 27, 2024  INTERVAL HISTORY: Edward [name] 83 y.o. male returns to the clinic today for follow-up visit accompanied by his wife.Discussed the use of AI scribe software for clinical note transcription with the patient, who gave verbal consent to proceed.  History of Present Illness Edward [name] is an 83 year old male with stage IIIC non-small cell lung cancer of the right lung presenting for oncology follow-up after recent hospitalization for post-obstructive pneumonia.  He was hospitalized last week for acute dyspnea and was found to have right middle lobe collapse and right lower lobe involvement secondary to tumor-related airway obstruction, resulting in post-obstructive pneumonia. He received inpatient treatment and was subsequently discharged.  Since discharge, he has initiated fractionated radiation therapy, with five of thirty-three planned sessions completed. Chemotherapy has not yet been started, and the option of concurrent chemoradiation is under discussion with the radiation oncologist.  Currently, he denies dyspnea and states that respiratory symptoms are not his primary  concern. He continues to experience significant fatigue, describing persistent somnolence. He also reports poor appetite and a dry, non-productive cough. He denies hemoptysis, chest pain, new respiratory complaints, nausea, or vomiting.  He and his wife discussed the potential addition of chemotherapy, including anticipated adverse effects such as cytopenias, neuropathy, and gastrointestinal symptoms, as well as logistical considerations regarding radiation scheduling.     MEDICAL HISTORY: Past Medical History:  Diagnosis Date   BPH (benign prostatic hyperplasia)    Cataract    surgical removal bilateral   CKD (chronic kidney disease) stage 3, GFR 30-59 ml/min (HCC)    DM (diabetes mellitus), type 2 (HCC)    ED (erectile dysfunction)    GERD (gastroesophageal reflux disease)    History of adenomatous polyp of colon 11/02/2017   Tubular adenoma 2013; recommended 5 year follow up   History of kidney stones    Hyperlipidemia    Hypertension    IBS (irritable bowel syndrome)    Kidney stones 02/11/2020   LBBB (left bundle branch block)    Lung nodule    Personal history of kidney stones    Vitamin D  deficiency     ALLERGIES:  is allergic to bystolic [nebivolol hcl].  MEDICATIONS:  Current Outpatient Medications  Medication Sig Dispense Refill   albuterol  (ACCUNEB ) 1.25 MG/3ML nebulizer solution Inhale 3 mL (1.25 mg total) by nebulization every 6 (six) hours as needed for wheezing. (Patient not taking: Reported on 08/15/2024) 75 mL 12   albuterol  (VENTOLIN  HFA) 108 (90 Base) MCG/ACT inhaler Inhale 2 puffs into the lungs every 4 (four) hours as needed for wheezing or shortness of breath. 6.7 g 2   amoxicillin -clavulanate (AUGMENTIN ) 875-125 MG tablet Take 1 tablet by mouth every 12 (twelve) hours for 28 days. 56 tablet  0   APPLE CIDER VINEGAR PO Take 1-2 tablets by mouth daily.     ascorbic acid (VITAMIN C) 500 MG tablet Take 500-1,000 mg by mouth daily.     aspirin  81 MG tablet Take  81 mg by mouth 3 (three) times a week.     atorvastatin  (LIPITOR) 40 MG tablet Take 1 tablet (40 mg total) by mouth every evening for cholesterol 270 tablet 0   Cholecalciferol (VITAMIN D3) 125 MCG (5000 UT) CAPS Take 5,000 Units by mouth daily.     CINNAMON PO Take 1-2 capsules by mouth daily.     Cyanocobalamin (VITAMIN B 12 PO) Take 1 tablet by mouth daily.     famotidine  (PEPCID ) 40 MG tablet Take 1 tablet (40 mg total) by mouth every evening to prevent heartburn and indigestion. (Patient taking differently: Take 40 mg by mouth See admin instructions. Take 40 mg by mouth in the evening as needed for reflux or indigestion) 90 tablet 0   fluticasone  (FLONASE ) 50 MCG/ACT nasal spray Place 1-2 sprays into both nostrils daily. (Patient taking differently: Place 1-2 sprays into both nostrils daily as needed for allergies or rhinitis.) 16 g 1   glucose blood (ONETOUCH VERIO) test strip USE TO TEST BLOOD SUGAR ONCE DAILY 100 strip 12   glucose blood test strip Use 1 strip to check glucose once daily. 100 each 12   losartan  (COZAAR ) 50 MG tablet Take 1 tablet (50 mg total) by mouth daily for blood pressure. 90 tablet 0   metFORMIN  (GLUCOPHAGE ) 500 MG tablet Take 1 tablet (500 mg total) by mouth 3 (three) times daily with meals for diabetes. 270 tablet 0   metoprolol  tartrate (LOPRESSOR ) 25 MG tablet Take 0.5 tablets (12.5 mg total) by mouth 2 (two) times daily. 90 tablet 0   OMEGA-3 FATTY ACIDS PO Take 1 capsule by mouth 3 (three) times a week.     pantoprazole  (PROTONIX ) 40 MG tablet Take 1 tablet (40 mg total) by mouth every evening to prevent heartburn and indigestion. (Patient taking differently: Take 40 mg by mouth See admin instructions. Take 40 mg by mouth 30 minutes before breakfast as needed for reflux or indigestion) 90 tablet 0   SYSTANE COMPLETE PF 0.6 % SOLN Place 1 drop into both eyes 3 (three) times daily as needed (for dryness or irritation).     tamsulosin  (FLOMAX ) 0.4 MG CAPS capsule Take  1 capsule (0.4 mg total) by mouth every evening for prostate. 90 capsule 0   Tiotropium Bromide-Olodaterol (STIOLTO RESPIMAT ) 2.5-2.5 MCG/ACT AERS Inhale 2 puffs into the lungs daily. (Patient not taking: Reported on 08/15/2024) 4 g 6   zinc gluconate 50 MG tablet Take 50 mg by mouth daily.     No current facility-administered medications for this visit.    SURGICAL HISTORY:  Past Surgical History:  Procedure Laterality Date   CATARACT EXTRACTION Right 2015   CATARACT EXTRACTION W/ INTRAOCULAR LENS IMPLANT  2013   left   COLONOSCOPY  2013   TA   ENDOBRONCHIAL ULTRASOUND Bilateral 07/30/2024   Procedure: ENDOBRONCHIAL ULTRASOUND (EBUS);  Surgeon: Isadora Hose, MD;  Location: [facility] OR[facility];  Service: Pulmonary;  Laterality: Bilateral;   EXTRACORPOREAL SHOCK WAVE LITHOTRIPSY Left 12/11/2017   Procedure: LEFT EXTRACORPOREAL SHOCK WAVE LITHOTRIPSY (ESWL);  Surgeon: Carolee Sherwood JONETTA DOUGLAS, MD;  Location: [facility];  Service: Urology;  Laterality: Left;   KIDNEY STONE SURGERY     POLYPECTOMY     VIDEO BRONCHOSCOPY WITH ENDOBRONCHIAL ULTRASOUND Bilateral 07/18/2024  Procedure: BRONCHOSCOPY, WITH EBUS;  Surgeon: Catherine Cools, MD;  Location: [facility];  Service: Pulmonary;  Laterality: Bilateral;    REVIEW OF SYSTEMS:  Constitutional: positive for fatigue Eyes: negative Ears, nose, mouth, throat, and face: negative Respiratory: positive for cough Cardiovascular: negative Gastrointestinal: negative Genitourinary:negative Integument/breast: negative Hematologic/lymphatic: negative Musculoskeletal:negative Neurological: negative Behavioral/Psych: negative Endocrine: negative Allergic/Immunologic: negative   PHYSICAL EXAMINATION: General appearance: alert, cooperative, fatigued, and no distress Head: Normocephalic, without obvious abnormality, atraumatic Neck: no adenopathy, no JVD, supple, symmetrical, trachea midline, and thyroid  not enlarged, symmetric, no tenderness/mass/nodules Lymph  nodes: Cervical, supraclavicular, and axillary nodes normal. Resp: clear to auscultation bilaterally Back: symmetric, no curvature. ROM normal. No CVA tenderness. Cardio: regular rate and rhythm, S1, S2 normal, no murmur, click, rub or gallop GI: soft, non-tender; bowel sounds normal; no masses,  no organomegaly Extremities: extremities normal, atraumatic, no cyanosis or edema Neurologic: Alert and oriented X 3, normal strength and tone. Normal symmetric reflexes. Normal coordination and gait  ECOG PERFORMANCE STATUS: 1 - Symptomatic but completely ambulatory  Blood pressure 132/80, pulse 71, temperature 97.8 F (36.6 C), temperature source Temporal, resp. rate 17, height 5' 9 (1.753 m), weight 157 lb 14.4 oz (71.6 kg), SpO2 93%.  LABORATORY DATA: Lab Results  Component Value Date   WBC 8.5 08/22/2024   HGB 13.3 08/22/2024   HCT 41.4 08/22/2024   MCV 88.1 08/22/2024   PLT 398 08/22/2024      Chemistry      Component Value Date/Time   NA 140 08/22/2024 0843   K 4.2 08/22/2024 0843   CL 105 08/22/2024 0843   CO2 25 08/22/2024 0843   BUN 15 08/22/2024 0843   CREATININE 1.08 08/22/2024 0843   CREATININE 1.34 (H) 08/22/2023 1156      Component Value Date/Time   CALCIUM  8.8 (L) 08/22/2024 0843   ALKPHOS 78 08/22/2024 0843   AST 21 08/22/2024 0843   ALT 23 08/22/2024 0843   BILITOT 0.3 08/22/2024 0843       RADIOGRAPHIC STUDIES: ECHOCARDIOGRAM COMPLETE Result Date: 08/17/2024    ECHOCARDIOGRAM REPORT   Patient Name:   Edward [name] Date of Exam: 08/17/2024 Medical Rec #:  [id number]         Height:       69.0 in Accession #:    [id number]        Weight:       157.2 lb Date of Birth:  03/25/1941         BSA:          1.865 m Patient Age:    83 years          BP:           110/70 mmHg Patient Gender: M                 HR:           79 bpm. Exam Location:  Inpatient Procedure: 2D Echo, Intracardiac Opacification Agent, Color Doppler and Cardiac            Doppler (Both Spectral  and Color Flow Doppler were utilized during            procedure). Indications:    Dyspnea R06.00  History:        Patient has no prior history of Echocardiogram examinations.                 Risk Factors:Diabetes, Hypertension and Dyslipidemia.  Sonographer:    Tinnie Gosling RDCS  Referring Phys: [id number] DANIEL P GOODRICH IMPRESSIONS  1. Left ventricular ejection fraction, by estimation, is 40 to 45%. The left ventricle has mildly decreased function. The left ventricle has no regional wall motion abnormalities. There is mild left ventricular hypertrophy. Left ventricular diastolic parameters are consistent with Grade I diastolic dysfunction (impaired relaxation).  2. Right ventricular systolic function is normal. The right ventricular size is normal.  3. Left atrial size was moderately dilated.  4. A small pericardial effusion is present. The pericardial effusion is anterior to the right ventricle and localized near the right ventricle.  5. The mitral valve is abnormal. Trivial mitral valve regurgitation. No evidence of mitral stenosis.  6. The aortic valve is tricuspid. There is mild calcification of the aortic valve. There is mild thickening of the aortic valve. Aortic valve regurgitation is not visualized. Aortic valve sclerosis is present, with no evidence of aortic valve stenosis.  7. The inferior vena cava is dilated in size with >50% respiratory variability, suggesting right atrial pressure of 8 mmHg. FINDINGS  Left Ventricle: Abnormal septal motion and ventricular ectopy during study. Left ventricular ejection fraction, by estimation, is 40 to 45%. The left ventricle has mildly decreased function. The left ventricle has no regional wall motion abnormalities. Definity  contrast agent was given IV to delineate the left ventricular endocardial borders. Strain was performed and the global longitudinal strain is indeterminate. The left ventricular internal cavity size was normal in size. There is mild left  ventricular hypertrophy. Left ventricular diastolic parameters are consistent with Grade I diastolic dysfunction (impaired relaxation). Right Ventricle: The right ventricular size is normal. No increase in right ventricular wall thickness. Right ventricular systolic function is normal. Left Atrium: Left atrial size was moderately dilated. Right Atrium: Right atrial size was normal in size. Pericardium: A small pericardial effusion is present. The pericardial effusion is anterior to the right ventricle and localized near the right ventricle. Mitral Valve: The mitral valve is abnormal. There is mild thickening of the mitral valve leaflet(s). Trivial mitral valve regurgitation. No evidence of mitral valve stenosis. Tricuspid Valve: The tricuspid valve is normal in structure. Tricuspid valve regurgitation is mild . No evidence of tricuspid stenosis. Aortic Valve: The aortic valve is tricuspid. There is mild calcification of the aortic valve. There is mild thickening of the aortic valve. Aortic valve regurgitation is not visualized. Aortic valve sclerosis is present, with no evidence of aortic valve stenosis. Pulmonic Valve: The pulmonic valve was normal in structure. Pulmonic valve regurgitation is trivial. No evidence of pulmonic stenosis. Aorta: The aortic root is normal in size and structure. Venous: The inferior vena cava is dilated in size with greater than 50% respiratory variability, suggesting right atrial pressure of 8 mmHg. IAS/Shunts: No atrial level shunt detected by color flow Doppler. Additional Comments: 3D was performed not requiring image post processing on an independent workstation and was indeterminate.  LEFT VENTRICLE PLAX 2D LVIDd:         5.60 cm      Diastology LVIDs:         4.30 cm      LV e' medial:    4.90 cm/s LV PW:         1.20 cm      LV E/e' medial:  10.7 LV IVS:        1.30 cm      LV e' lateral:   7.29 cm/s LVOT diam:     2.30 cm      LV E/e' lateral:  7.2 LV SV:         64 LV SV Index:    34 LVOT Area:     4.15 cm  LV Volumes (MOD) LV vol d, MOD A2C: 133.0 ml LV vol d, MOD A4C: 114.0 ml LV vol s, MOD A2C: 81.1 ml LV vol s, MOD A4C: 80.3 ml LV SV MOD A2C:     51.9 ml LV SV MOD A4C:     114.0 ml LV SV MOD BP:      47.7 ml RIGHT VENTRICLE             IVC RV S prime:     17.60 cm/s  IVC diam: 2.70 cm LEFT ATRIUM             Index        RIGHT ATRIUM           Index LA diam:        4.30 cm 2.31 cm/m   RA Area:     12.90 cm LA Vol (A2C):   56.6 ml 30.35 ml/m  RA Volume:   29.50 ml  15.82 ml/m LA Vol (A4C):   43.6 ml 23.38 ml/m LA Biplane Vol: 50.7 ml 27.19 ml/m  AORTIC VALVE LVOT Vmax:   74.60 cm/s LVOT Vmean:  49.000 cm/s LVOT VTI:    0.153 m  AORTA Ao Root diam: 3.60 cm Ao Asc diam:  3.70 cm MITRAL VALVE               TRICUSPID VALVE MV Area (PHT): 2.86 cm    TR Peak grad:   24.4 mmHg MV E velocity: 52.30 cm/s  TR Vmax:        247.00 cm/s MV A velocity: 76.30 cm/s MV E/A ratio:  0.69        SHUNTS                            Systemic VTI:  0.15 m                            Systemic Diam: 2.30 cm Edward Emmer MD Electronically signed by Edward Emmer MD Signature Date/Time: 08/17/2024/11:25:20 AM    Final    DG Chest 2 View Result Date: 08/15/2024 CLINICAL DATA:  post bronch. rule out pneumothorax EXAM: CHEST - 2 VIEW COMPARISON:  July 30, 2024 FINDINGS: Increasing airspace consolidation in the right lower lobe. Small pleural effusion on the right. No pneumothorax. The right infrahilar lung mass is less conspicuous on today's radiograph due to the airspace disease in the right lung base. Mild cardiomegaly. Aortic atherosclerosis. No acute fracture or destructive lesions. Multilevel thoracic osteophytosis. IMPRESSION: Increasing airspace consolidation in the right lower lobe, possibly worsening atelectasis or airspace disease given the patient's right infrahilar lung mass. Small right pleural effusion. Electronically Signed   By: Rogelia Myers M.D.   On: 08/15/2024 15:29   CT Angio Chest  PE W and/or Wo Contrast Result Date: 08/15/2024 CLINICAL DATA:  Pulmonary embolism (PE) suspected, low to intermediate prob, positive D-dimer EXAM: CT ANGIOGRAPHY CHEST WITH CONTRAST TECHNIQUE: Multidetector CT imaging of the chest was performed using the standard protocol during bolus administration of intravenous contrast. Multiplanar CT image reconstructions and MIPs were obtained to evaluate the vascular anatomy. RADIATION DOSE REDUCTION: This exam was performed according to the departmental dose-optimization program which includes automated exposure control, adjustment of the mA  and/or kV according to patient size and/or use of iterative reconstruction technique. CONTRAST:  75mL OMNIPAQUE  IOHEXOL  350 MG/ML SOLN COMPARISON:  08/14/2024, 07/22/2024, 07/02/2024 FINDINGS: Pulmonary Embolism: No pulmonary embolism. Cardiovascular: Mild cardiomegaly. Moderate volume pericardial effusion.No aortic aneurysm. Mediastinum/Nodes: No mediastinal mass.Subcarinal lymph node measuring 1.4 cm (axial 76). Unchanged enlarged lower left paratracheal lymph node. Lungs/Pleura: The midline trachea and bronchi are patent. Redemonstration of a right infrahilar mass, which measures 6 x 7.7 x 6.4 cm (previously, 5.7 x 6.3 x 5.6 cm). The mass is again noted to completely obstruct the right middle lobe bronchus with severe to near complete narrowing of the right lower lobe bronchi. The mass also significantly narrows the right interlobar pulmonary artery in the right middle lobe pulmonary artery with complete occlusion of the superior right pulmonary vein. Interval development of dense airspace consolidation filling the majority of the right lower lobe with unchanged, but significant atelectasis of the right middle lobe. No pneumothorax. Small right pleural effusion. Musculoskeletal: No acute fracture. Similar lucent lesion in the right scapula measuring 1.3 cm, which was not FDG avid on the prior PET-CT. Multilevel degenerative disc  disease of the spine. Thoracic DISH. Upper Abdomen: No acute abnormality in the partially visualized upper abdomen. Reflux of contrast into the hepatic veins. Review of the MIP images confirms the above findings. IMPRESSION: 1. No pulmonary embolism. 2. Enlarging right infrahilar mass, as measured above. This causes complete obstruction of the right middle lobe bronchus with severe to near-complete narrowing of the right lower lobe bronchi. New dense airspace consolidation filling the majority of the right lower lobe. While this may represent right lower lobe atelectasis, it would be worrisome for postobstructive pneumonia, in the correct clinical context. Small right pleural effusion. 3. Reflux of contrast into the hepatic veins is suggestive of underlying cardiac dysfunction. 4. Redemonstrated multistation mediastinal lymph nodes, with a more prominent subcarinal lymph node, concerning for worsening metastatic disease. Aortic Atherosclerosis (ICD10-I70.0). Electronically Signed   By: Rogelia Myers M.D.   On: 08/15/2024 15:26   VAS US  LOWER EXTREMITY VENOUS (DVT) (ONLY [facility]) Result Date: 08/15/2024  Lower Venous DVT Study Patient Name:  Edward [name]  Date of Exam:   08/15/2024 Medical Rec #: [id number]          Accession #:    [id number] Date of Birth: 29-Mar-1941          Patient Gender: M Patient Age:   6 years [facility] Procedure:      VAS US  LOWER EXTREMITY VENOUS (DVT) Referring Phys: Edward MESSICK --------------------------------------------------------------------------------  Indications: SOB. Other Indications: History of lung cancer. Risk Factors: COPD. Comparison Study: No priors. Performing Technologist: Ricka Sturdivant-Jones RDMS, RVT  Examination Guidelines: A complete evaluation includes B-mode imaging, spectral Doppler, color Doppler, and power Doppler as needed of all accessible portions of each vessel. Bilateral testing is considered an integral part of a  complete examination. Limited examinations for reoccurring indications may be performed as noted. The reflux portion of the exam is performed with the patient in reverse Trendelenburg.  +---------+---------------+---------+-----------+----------+--------------+ RIGHT    CompressibilityPhasicitySpontaneityPropertiesThrombus Aging +---------+---------------+---------+-----------+----------+--------------+ CFV      Full           Yes      Yes                                 +---------+---------------+---------+-----------+----------+--------------+ SFJ      Full                                                        +---------+---------------+---------+-----------+----------+--------------+  FV Prox  Full                                                        +---------+---------------+---------+-----------+----------+--------------+ FV Mid   Full           Yes      Yes                                 +---------+---------------+---------+-----------+----------+--------------+ FV DistalFull                                                        +---------+---------------+---------+-----------+----------+--------------+ PFV      Full                                                        +---------+---------------+---------+-----------+----------+--------------+ POP      Full           Yes      Yes                                 +---------+---------------+---------+-----------+----------+--------------+ PTV      Full                                                        +---------+---------------+---------+-----------+----------+--------------+ PERO     Full                                                        +---------+---------------+---------+-----------+----------+--------------+   +---------+---------------+---------+-----------+----------+--------------+ LEFT     CompressibilityPhasicitySpontaneityPropertiesThrombus Aging  +---------+---------------+---------+-----------+----------+--------------+ CFV      Full           Yes      Yes                                 +---------+---------------+---------+-----------+----------+--------------+ SFJ      Full                                                        +---------+---------------+---------+-----------+----------+--------------+ FV Prox  Full                                                        +---------+---------------+---------+-----------+----------+--------------+  FV Mid   Full           No       Yes                                 +---------+---------------+---------+-----------+----------+--------------+ FV DistalFull                                                        +---------+---------------+---------+-----------+----------+--------------+ PFV      Full                                                        +---------+---------------+---------+-----------+----------+--------------+ POP      Full           Yes      Yes                                 +---------+---------------+---------+-----------+----------+--------------+ PTV      Full                                                        +---------+---------------+---------+-----------+----------+--------------+ PERO     Full                                                        +---------+---------------+---------+-----------+----------+--------------+     Summary: BILATERAL: - No evidence of deep vein thrombosis seen in the lower extremities, bilaterally. -No evidence of popliteal cyst, bilaterally.   *See table(s) above for measurements and observations. Electronically signed by Debby Robertson on 08/15/2024 at 2:26:10 PM.    Final    DG Chest Port 1 View Result Date: 07/30/2024 EXAM: 1 VIEW(S) XRAY OF THE CHEST 07/30/2024 11:08:51 AM COMPARISON: 06/27/2024 CLINICAL HISTORY: S/P Bronchoscopy FINDINGS: LUNGS AND PLEURA: Right lower lobe  infrahilar masslike fullness persists. Low lung volumes with crowding of vasculature and potentially atelectasis in the left lower lobe. Previous blunting of the right lateral costophrenic angle has resolved. No pleural effusion. No pneumothorax. HEART AND MEDIASTINUM: No acute abnormality of the cardiac and mediastinal silhouettes. BONES AND SOFT TISSUES: Thoracic spondylosis. No acute osseous abnormality. IMPRESSION: 1. Right lower lobe infrahilar masslike fullness persists. 2. Low lung volumes with crowding of vasculature and probable left lower lobe atelectasis. 3. Thoracic spondylosis. Electronically signed by: Ryan Salvage MD 07/30/2024 11:31 AM EST RP Workstation: [id number]    ASSESSMENT AND PLAN: This is a very pleasant 83 years old white male with stage IIIC (T3, N3, M0) small cell lung cancer, well-differentiated neuroendocrine tumor, suspicious for atypical carcinoid presented with right suprahilar mass with occlusion of the right middle lobe and constriction of the right lower lobe bronchus in addition to hypermetabolic lymph node extending to the contralateral  mediastinum and possibly left internal jugular station.  Diagnosed in November 2025. Patient was recently admitted with bronchial obstruction and pneumonia.  He started radiotherapy to the obstructive lesion status post 1 week of treatment. Assessment and Plan Assessment & Plan Stage IIIC well-differentiated non-small cell lung cancer of the right lung with airway obstruction He has advanced, well-differentiated non-small cell lung cancer of the right lung causing airway obstruction, resulting in collapse of the right middle lobe and involvement of the right lower lobe bronchus. He is undergoing radiation therapy, with five of thirty-three planned sessions completed. Airway obstruction has led to recent pulmonary complications. Chemotherapy is being considered with caution due to potential tolerability concerns related to his age.   His chemotherapy will be in the form of carboplatin for AUC of 5 on day 1 and Doutova side 100 mg/M2 on days 1, 2 and 3 every 3 weeks for 4 cycles.  He is expected to start the first cycle of this treatment next week after I discussed the plan with the patient and his wife as well as Dr. Shannon. - Contacted radiation oncology to discuss potential addition of chemotherapy to current radiation regimen. - Provided education regarding chemotherapy side effects, including alopecia, cytopenias, nausea, vomiting, and neuropathy. - Advised him to coordinate with radiation department regarding scheduling conflicts for church attendance. - Planned follow-up in two months if chemotherapy is not added; will arrange care if chemotherapy is initiated.  Post-obstructive pneumonia He was recently hospitalized for post-obstructive pneumonia secondary to airway obstruction from his lung tumor. He reports improvement in respiratory symptoms, with no ongoing dyspnea and only a persistent dry cough. He was advised to call immediately if he has any other concerning symptoms in the interval The patient voices understanding of current disease status and treatment options and is in agreement with the current care plan.  All questions were answered. The patient knows to call the clinic with any problems, questions or concerns. We can certainly see the patient much sooner if necessary.  The total time spent in the appointment was 35 minutes including review of chart and various tests results, discussions about plan of care and coordination of care plan .   Disclaimer: This note was dictated with voice recognition software. Similar sounding words can inadvertently be transcribed and may not be corrected upon review.

## 2024-08-23 ENCOUNTER — Other Ambulatory Visit: Payer: Self-pay

## 2024-08-23 ENCOUNTER — Ambulatory Visit
Admission: RE | Admit: 2024-08-23 | Discharge: 2024-08-23 | Attending: Radiation Oncology | Admitting: Radiation Oncology

## 2024-08-23 DIAGNOSIS — Z5111 Encounter for antineoplastic chemotherapy: Secondary | ICD-10-CM | POA: Diagnosis not present

## 2024-08-23 LAB — RAD ONC ARIA SESSION SUMMARY
Course Elapsed Days: 7
Plan Fractions Treated to Date: 6
Plan Prescribed Dose Per Fraction: 2 Gy
Plan Total Fractions Prescribed: 30
Plan Total Prescribed Dose: 60 Gy
Reference Point Dosage Given to Date: 12 Gy
Reference Point Session Dosage Given: 2 Gy
Session Number: 6

## 2024-08-26 ENCOUNTER — Ambulatory Visit
Admission: RE | Admit: 2024-08-26 | Discharge: 2024-08-26 | Attending: Radiation Oncology | Admitting: Radiation Oncology

## 2024-08-26 ENCOUNTER — Other Ambulatory Visit: Payer: Self-pay

## 2024-08-26 DIAGNOSIS — Z5111 Encounter for antineoplastic chemotherapy: Secondary | ICD-10-CM | POA: Diagnosis not present

## 2024-08-26 LAB — RAD ONC ARIA SESSION SUMMARY
Course Elapsed Days: 10
Plan Fractions Treated to Date: 7
Plan Prescribed Dose Per Fraction: 2 Gy
Plan Total Fractions Prescribed: 30
Plan Total Prescribed Dose: 60 Gy
Reference Point Dosage Given to Date: 14 Gy
Reference Point Session Dosage Given: 2 Gy
Session Number: 7

## 2024-08-27 ENCOUNTER — Encounter: Payer: Self-pay | Admitting: Thoracic Surgery (Cardiothoracic Vascular Surgery)

## 2024-08-27 ENCOUNTER — Ambulatory Visit
Admission: RE | Admit: 2024-08-27 | Discharge: 2024-08-27 | Attending: Radiation Oncology | Admitting: Radiation Oncology

## 2024-08-27 ENCOUNTER — Ambulatory Visit: Admitting: Thoracic Surgery (Cardiothoracic Vascular Surgery)

## 2024-08-27 ENCOUNTER — Other Ambulatory Visit (HOSPITAL_COMMUNITY): Payer: Self-pay

## 2024-08-27 ENCOUNTER — Other Ambulatory Visit: Payer: Self-pay

## 2024-08-27 DIAGNOSIS — Z5111 Encounter for antineoplastic chemotherapy: Secondary | ICD-10-CM | POA: Diagnosis not present

## 2024-08-27 LAB — RAD ONC ARIA SESSION SUMMARY
Course Elapsed Days: 11
Plan Fractions Treated to Date: 8
Plan Prescribed Dose Per Fraction: 2 Gy
Plan Total Fractions Prescribed: 30
Plan Total Prescribed Dose: 60 Gy
Reference Point Dosage Given to Date: 16 Gy
Reference Point Session Dosage Given: 2 Gy
Session Number: 8

## 2024-08-28 ENCOUNTER — Other Ambulatory Visit (HOSPITAL_COMMUNITY): Payer: Self-pay

## 2024-08-28 ENCOUNTER — Ambulatory Visit
Admission: RE | Admit: 2024-08-28 | Discharge: 2024-08-28 | Attending: Radiation Oncology | Admitting: Radiation Oncology

## 2024-08-28 ENCOUNTER — Other Ambulatory Visit: Payer: Self-pay

## 2024-08-28 ENCOUNTER — Encounter: Payer: Self-pay | Admitting: Internal Medicine

## 2024-08-28 ENCOUNTER — Inpatient Hospital Stay

## 2024-08-28 VITALS — BP 112/61 | HR 58 | Temp 97.8°F | Resp 18 | Wt 159.5 lb

## 2024-08-28 DIAGNOSIS — C7A8 Other malignant neuroendocrine tumors: Secondary | ICD-10-CM

## 2024-08-28 DIAGNOSIS — Z5111 Encounter for antineoplastic chemotherapy: Secondary | ICD-10-CM | POA: Diagnosis not present

## 2024-08-28 LAB — RAD ONC ARIA SESSION SUMMARY
Course Elapsed Days: 12
Plan Fractions Treated to Date: 9
Plan Prescribed Dose Per Fraction: 2 Gy
Plan Total Fractions Prescribed: 30
Plan Total Prescribed Dose: 60 Gy
Reference Point Dosage Given to Date: 18 Gy
Reference Point Session Dosage Given: 2 Gy
Session Number: 9

## 2024-08-28 MED ORDER — FAMOTIDINE 20 MG PO TABS
40.0000 mg | ORAL_TABLET | Freq: Once | ORAL | Status: AC
  Administered 2024-08-28: 15:00:00 40 mg via ORAL

## 2024-08-28 MED ORDER — DEXAMETHASONE SOD PHOSPHATE PF 10 MG/ML IJ SOLN
10.0000 mg | Freq: Once | INTRAMUSCULAR | Status: AC
  Administered 2024-08-28: 12:00:00 10 mg via INTRAVENOUS

## 2024-08-28 MED ORDER — SODIUM CHLORIDE 0.9 % IV SOLN
150.0000 mg | Freq: Once | INTRAVENOUS | Status: AC
  Administered 2024-08-28: 12:00:00 150 mg via INTRAVENOUS
  Filled 2024-08-28: qty 150

## 2024-08-28 MED ORDER — PALONOSETRON HCL INJECTION 0.25 MG/5ML
0.2500 mg | Freq: Once | INTRAVENOUS | Status: AC
  Administered 2024-08-28: 12:00:00 0.25 mg via INTRAVENOUS
  Filled 2024-08-28: qty 5

## 2024-08-28 MED ORDER — SODIUM CHLORIDE 0.9 % IV SOLN: INTRAVENOUS | Status: DC

## 2024-08-28 MED ORDER — SODIUM CHLORIDE 0.9 % IV SOLN
392.0000 mg | Freq: Once | INTRAVENOUS | Status: AC
  Administered 2024-08-28: 13:00:00 390 mg via INTRAVENOUS
  Filled 2024-08-28: qty 39

## 2024-08-28 MED ORDER — FAMOTIDINE 20 MG PO TABS
20.0000 mg | ORAL_TABLET | Freq: Once | ORAL | Status: DC
  Filled 2024-08-28: qty 1

## 2024-08-28 MED ORDER — SODIUM CHLORIDE 0.9 % IV SOLN
100.0000 mg/m2 | Freq: Once | INTRAVENOUS | Status: AC
  Administered 2024-08-28: 14:00:00 200 mg via INTRAVENOUS
  Filled 2024-08-28: qty 10

## 2024-08-28 NOTE — Progress Notes (Signed)
 Spoke to patient at chairside, pt requesting to begin chemotherapy. Cassie, PA ok to proceed. Reached out to [facility] to obtain education packet for patient as he has not been able to attend an education session. Mazie, RN creating packet for patient. Pt aware and appreciative.

## 2024-08-28 NOTE — Progress Notes (Unsigned)
 Edward [name] met the pt and his wife, Edward [name], today during his first chemotherapy treatment. Reviewed pt schedule and provided direct contact information. Edward [name] encouraged pt and wife to reach out with any questions or concerns.

## 2024-08-28 NOTE — Patient Instructions (Signed)
 [facility] - A DEPT OF [facility]  Discharge Instructions: Thank you for choosing [facility] to provide your oncology and hematology care.   If you have a lab appointment with the Cancer Center, please go directly to the Cancer Center and check in at the registration area.   Wear comfortable clothing and clothing appropriate for easy access to any Portacath or PICC line.   We strive to give you quality time with your provider. You may need to reschedule your appointment if you arrive late (15 or more minutes).  Arriving late affects you and other patients whose appointments are after yours.  Also, if you miss three or more appointments without notifying the office, you may be dismissed from the clinic at the providers discretion.      For prescription refill requests, have your pharmacy contact our office and allow 72 hours for refills to be completed.    Today you received the following chemotherapy and/or immunotherapy agents carboplatin  etoposide        To help prevent nausea and vomiting after your treatment, we encourage you to take your nausea medication as directed.  BELOW ARE SYMPTOMS THAT SHOULD BE REPORTED IMMEDIATELY: *FEVER GREATER THAN 100.4 F (38 C) OR HIGHER *CHILLS OR SWEATING *NAUSEA AND VOMITING THAT IS NOT CONTROLLED WITH YOUR NAUSEA MEDICATION *UNUSUAL SHORTNESS OF BREATH *UNUSUAL BRUISING OR BLEEDING *URINARY PROBLEMS (pain or burning when urinating, or frequent urination) *BOWEL PROBLEMS (unusual diarrhea, constipation, pain near the anus) TENDERNESS IN MOUTH AND THROAT WITH OR WITHOUT PRESENCE OF ULCERS (sore throat, sores in mouth, or a toothache) UNUSUAL RASH, SWELLING OR PAIN  UNUSUAL VAGINAL DISCHARGE OR ITCHING   Items with * indicate a potential emergency and should be followed up as soon as possible or go to the Emergency Department if any problems should occur.  Please show the CHEMOTHERAPY ALERT CARD or  IMMUNOTHERAPY ALERT CARD at check-in to the Emergency Department and triage nurse.  Should you have questions after your visit or need to cancel or reschedule your appointment, please contact [facility] - [facility] DEPT [facility]  Dept: [phone]  and follow the prompts.  Office hours are 8:00 a.m. to 4:30 p.m. Monday - Friday. Please note that voicemails left after 4:00 p.m. may not be returned until the following business day.  We are closed weekends and major holidays. You have access to a nurse at all times for urgent questions. Please call the main number to the clinic Dept: [phone] and follow the prompts.   For any non-urgent questions, you may also contact your provider using MyChart. We now offer e-Visits for anyone 48 and older to request care online for non-urgent symptoms. For details visit [email or web address].   Also download the MyChart app! Go to the app store, search MyChart, open the app, select [place], and log in with your MyChart username and password.

## 2024-08-29 ENCOUNTER — Other Ambulatory Visit: Payer: Self-pay

## 2024-08-29 ENCOUNTER — Ambulatory Visit
Admission: RE | Admit: 2024-08-29 | Discharge: 2024-08-29 | Attending: Radiation Oncology | Admitting: Radiation Oncology

## 2024-08-29 ENCOUNTER — Other Ambulatory Visit: Payer: Self-pay | Admitting: Physician Assistant

## 2024-08-29 ENCOUNTER — Telehealth: Payer: Self-pay

## 2024-08-29 ENCOUNTER — Inpatient Hospital Stay

## 2024-08-29 VITALS — BP 122/67 | HR 60 | Temp 97.7°F | Resp 16

## 2024-08-29 DIAGNOSIS — C7A8 Other malignant neuroendocrine tumors: Secondary | ICD-10-CM

## 2024-08-29 DIAGNOSIS — Z5111 Encounter for antineoplastic chemotherapy: Secondary | ICD-10-CM | POA: Diagnosis not present

## 2024-08-29 LAB — RAD ONC ARIA SESSION SUMMARY
Course Elapsed Days: 13
Plan Fractions Treated to Date: 10
Plan Prescribed Dose Per Fraction: 2 Gy
Plan Total Fractions Prescribed: 30
Plan Total Prescribed Dose: 60 Gy
Reference Point Dosage Given to Date: 20 Gy
Reference Point Session Dosage Given: 2 Gy
Session Number: 10

## 2024-08-29 MED ORDER — SODIUM CHLORIDE 0.9 % IV SOLN
100.0000 mg/m2 | Freq: Once | INTRAVENOUS | Status: AC
  Administered 2024-08-29: 15:00:00 200 mg via INTRAVENOUS
  Filled 2024-08-29: qty 10

## 2024-08-29 MED ORDER — SODIUM CHLORIDE 0.9 % IV SOLN: INTRAVENOUS | Status: DC

## 2024-08-29 MED ORDER — DEXAMETHASONE SOD PHOSPHATE PF 10 MG/ML IJ SOLN
10.0000 mg | Freq: Once | INTRAMUSCULAR | Status: AC
  Administered 2024-08-29: 14:00:00 10 mg via INTRAVENOUS

## 2024-08-29 NOTE — Telephone Encounter (Signed)
 Copied from CRM #[id number]. Topic: Clinical - Medication Question >> Aug 29, 2024  2:17 PM Ivette P wrote: Reason for CRM: Pt called in regarding blood pressure. Pt wife carol called in tos tate that pt was given 2 different blood pressure medication and wants to know if they continue taking both.   Pt does not know the name of the medications  Please follow up with pt.

## 2024-08-29 NOTE — Telephone Encounter (Signed)
 Called pt and wife for clarification, they state pt has been on the losartan  for years but recently was put on metoprolol  at his last hospital visit and has been taking it since then. BP has been ranging 122/66, 132/80 at home, never over 140s. They would like to know if pt needs to stay on both or if he can go back to just the losartan ?

## 2024-08-29 NOTE — Patient Instructions (Signed)
 [facility] - A DEPT OF [facility]  Discharge Instructions: Thank you for choosing [facility] to provide your oncology and hematology care.   If you have a lab appointment with the Cancer Center, please go directly to the Cancer Center and check in at the registration area.   Wear comfortable clothing and clothing appropriate for easy access to any Portacath or PICC line.   We strive to give you quality time with your provider. You may need to reschedule your appointment if you arrive late (15 or more minutes).  Arriving late affects you and other patients whose appointments are after yours.  Also, if you miss three or more appointments without notifying the office, you may be dismissed from the clinic at the provider's discretion.      For prescription refill requests, have your pharmacy contact our office and allow 72 hours for refills to be completed.    Today you received the following chemotherapy and/or immunotherapy agents: Etoposide      To help prevent nausea and vomiting after your treatment, we encourage you to take your nausea medication as directed.  BELOW ARE SYMPTOMS THAT SHOULD BE REPORTED IMMEDIATELY: *FEVER GREATER THAN 100.4 F (38 C) OR HIGHER *CHILLS OR SWEATING *NAUSEA AND VOMITING THAT IS NOT CONTROLLED WITH YOUR NAUSEA MEDICATION *UNUSUAL SHORTNESS OF BREATH *UNUSUAL BRUISING OR BLEEDING *URINARY PROBLEMS (pain or burning when urinating, or frequent urination) *BOWEL PROBLEMS (unusual diarrhea, constipation, pain near the anus) TENDERNESS IN MOUTH AND THROAT WITH OR WITHOUT PRESENCE OF ULCERS (sore throat, sores in mouth, or a toothache) UNUSUAL RASH, SWELLING OR PAIN  UNUSUAL VAGINAL DISCHARGE OR ITCHING   Items with * indicate a potential emergency and should be followed up as soon as possible or go to the Emergency Department if any problems should occur.  Please show the CHEMOTHERAPY ALERT CARD or IMMUNOTHERAPY  ALERT CARD at check-in to the Emergency Department and triage nurse.  Should you have questions after your visit or need to cancel or reschedule your appointment, please contact [facility] - A DEPT OF [facility]  Dept: [phone]  and follow the prompts.  Office hours are 8:00 a.m. to 4:30 p.m. Monday - Friday. Please note that voicemails left after 4:00 p.m. may not be returned until the following business day.  We are closed weekends and major holidays. You have access to a nurse at all times for urgent questions. Please call the main number to the clinic Dept: [phone] and follow the prompts.   For any non-urgent questions, you may also contact your provider using MyChart. We now offer e-Visits for anyone 35 and older to request care online for non-urgent symptoms. For details visit [email or web address].   Also download the MyChart app! Go to the app store, search MyChart, open the app, select [place], and log in with your MyChart username and password.

## 2024-08-30 ENCOUNTER — Inpatient Hospital Stay

## 2024-08-30 ENCOUNTER — Other Ambulatory Visit: Payer: Self-pay

## 2024-08-30 ENCOUNTER — Inpatient Hospital Stay: Admitting: Dietician

## 2024-08-30 ENCOUNTER — Ambulatory Visit: Admitting: Family Medicine

## 2024-08-30 ENCOUNTER — Ambulatory Visit

## 2024-08-30 VITALS — BP 113/61 | HR 59 | Temp 97.9°F | Resp 16

## 2024-08-30 DIAGNOSIS — C7A8 Other malignant neuroendocrine tumors: Secondary | ICD-10-CM

## 2024-08-30 DIAGNOSIS — Z5111 Encounter for antineoplastic chemotherapy: Secondary | ICD-10-CM | POA: Diagnosis not present

## 2024-08-30 LAB — RAD ONC ARIA SESSION SUMMARY
Course Elapsed Days: 14
Plan Fractions Treated to Date: 11
Plan Prescribed Dose Per Fraction: 2 Gy
Plan Total Fractions Prescribed: 30
Plan Total Prescribed Dose: 60 Gy
Reference Point Dosage Given to Date: 22 Gy
Reference Point Session Dosage Given: 2 Gy
Session Number: 11

## 2024-08-30 MED ORDER — SODIUM CHLORIDE 0.9 % IV SOLN
100.0000 mg/m2 | Freq: Once | INTRAVENOUS | Status: AC
  Administered 2024-08-30: 200 mg via INTRAVENOUS
  Filled 2024-08-30: qty 10

## 2024-08-30 MED ORDER — SODIUM CHLORIDE 0.9 % IV SOLN: INTRAVENOUS | Status: DC

## 2024-08-30 MED ORDER — DEXAMETHASONE SOD PHOSPHATE PF 10 MG/ML IJ SOLN
10.0000 mg | Freq: Once | INTRAMUSCULAR | Status: AC
  Administered 2024-08-30: 10 mg via INTRAVENOUS

## 2024-08-30 NOTE — Patient Instructions (Signed)
 [facility] - A DEPT OF [facility]  Discharge Instructions: Thank you for choosing [facility] to provide your oncology and hematology care.   If you have a lab appointment with the Cancer Center, please go directly to the Cancer Center and check in at the registration area.   Wear comfortable clothing and clothing appropriate for easy access to any Portacath or PICC line.   We strive to give you quality time with your provider. You may need to reschedule your appointment if you arrive late (15 or more minutes).  Arriving late affects you and other patients whose appointments are after yours.  Also, if you miss three or more appointments without notifying the office, you may be dismissed from the clinic at the provider's discretion.      For prescription refill requests, have your pharmacy contact our office and allow 72 hours for refills to be completed.    Today you received the following chemotherapy and/or immunotherapy agents: Etoposide      To help prevent nausea and vomiting after your treatment, we encourage you to take your nausea medication as directed.  BELOW ARE SYMPTOMS THAT SHOULD BE REPORTED IMMEDIATELY: *FEVER GREATER THAN 100.4 F (38 C) OR HIGHER *CHILLS OR SWEATING *NAUSEA AND VOMITING THAT IS NOT CONTROLLED WITH YOUR NAUSEA MEDICATION *UNUSUAL SHORTNESS OF BREATH *UNUSUAL BRUISING OR BLEEDING *URINARY PROBLEMS (pain or burning when urinating, or frequent urination) *BOWEL PROBLEMS (unusual diarrhea, constipation, pain near the anus) TENDERNESS IN MOUTH AND THROAT WITH OR WITHOUT PRESENCE OF ULCERS (sore throat, sores in mouth, or a toothache) UNUSUAL RASH, SWELLING OR PAIN  UNUSUAL VAGINAL DISCHARGE OR ITCHING   Items with * indicate a potential emergency and should be followed up as soon as possible or go to the Emergency Department if any problems should occur.  Please show the CHEMOTHERAPY ALERT CARD or IMMUNOTHERAPY  ALERT CARD at check-in to the Emergency Department and triage nurse.  Should you have questions after your visit or need to cancel or reschedule your appointment, please contact [facility] - A DEPT OF [facility]  Dept: [phone]  and follow the prompts.  Office hours are 8:00 a.m. to 4:30 p.m. Monday - Friday. Please note that voicemails left after 4:00 p.m. may not be returned until the following business day.  We are closed weekends and major holidays. You have access to a nurse at all times for urgent questions. Please call the main number to the clinic Dept: [phone] and follow the prompts.   For any non-urgent questions, you may also contact your provider using MyChart. We now offer e-Visits for anyone 35 and older to request care online for non-urgent symptoms. For details visit [email or web address].   Also download the MyChart app! Go to the app store, search MyChart, open the app, select [place], and log in with your MyChart username and password.

## 2024-08-30 NOTE — Progress Notes (Signed)
 Nutrition Assessment   Reason for Assessment: +MST (poor appetite, wt loss)   ASSESSMENT: 83 year old male with stage III NSCLC. He is receiving concurrent chemoradiation with carboplatin  + etoposide  q21d. Patient under the care of Dr. Sherrod  Past medical history includes HTN, DM2, GERD, HLD, CKD3, BPH with obstruction, vit D deficiency, former smoker  Met with patient and wife in infusion. Reports tolerating therapy well so far. Patient does not have much of an appetite. Wife notes this has been ongoing since beginning of year when started on Jardiance  (which is not listed in meds). Patient eats 3 meals usually. Unable to eat much at one time and does not clean his plate as he once did. Patient says he is trying to drink one Ensure a day. Does not like them. Yesterday had chick fila sandwich and a milkshake after leaving cancer center. Patient reports episode of diarrhea from the shake. Says he forgot to take his lactose pill.    Nutrition Focused Physical Exam:   Orbital Region: moderate Buccal Region: moderate Upper Arm Region: Air Cabin Crew and Lumbar Region: uta Temple Region: severe Clavicle Bone Region: severe Shoulder and Acromion Bone Region: uta Scapular Bone Region: uta Dorsal Hand: moderate Patellar Region: uta Anterior Thigh Region: uta Posterior Calf Region: uta Edema (RD assessment): uta Hair: reviewed  Eyes: reviewed  Mouth: reviewed  Skin: reviewed  Nails: reviewed    Medications: lipitor, D3, B12, pepcid , cozaar , metformin , lopressor , zofran , protonix , compazine , flomax , zinc gluconate, vit C   Labs: 12/11 - glucose 173, albumin 3.3, Corrected Ca 8.8 10/10 Hgb A1c - 7.0  Anthropometrics:   Height: 5'9 Weight: 159 lb 8 oz (12/17) UBW: 170-175 (2024) - 170 lb on  03/12/24 BMI: 23.55   NUTRITION DIAGNOSIS: Inadequate oral intake related to decreased appetite as evidenced by 2.5% wt loss in 2 weeks (pt 163 lb 4.8 oz 12/3) which is severe for time  frame   INTERVENTION:  Educated on importance of adequate calorie/protein energy intake to maintain strength/weights during treatment Discussed eating smaller meals/snacks 4-6 times vs attempting 3 bigger meals - handout with snack ideas provided  Educated on sources of protein, recommend protein foods at every meal Suggested soft moist protein foods for ease of intake - handout provided  Encourage daily Ensure/glucerna - samples + coupons  Decreased appetite noticed after start of jardiance  per wife. Given poor appetite/intake along with severe wt loss, consider switching DM meds as side effects of Jardiance  include wt loss and decreased appetite - will message PCP   MONITORING, EVALUATION, GOAL: Patient will tolerate increased calories and protein to minimize further wt loss    Next Visit: Wednesday January 7 during infusion

## 2024-08-30 NOTE — Progress Notes (Signed)
 [facility] OFFICE PROGRESS NOTE  [name], NP-C [address] [place] [address]  DIAGNOSIS: Stage IIIC (T3, N3, M0) well-differentiated neuroendocrine tumor, suspicious for atypical carcinoid presented with right suprahilar mass with occlusion of the right middle lobe and constriction of the right lower lobe bronchus in addition to hypermetabolic lymph node extending to the contralateral mediastinum and possibly left internal jugular station.  Diagnosed in November 2025   PRIOR THERAPY: None  CURRENT THERAPY: Systemic chemotherapy with carboplatin  for AUC of 5 on day 1 and etoposide  100 mg/M2 on days 1, 2 and 3 every 3 weeks concurrent with radiotherapy. First dose August 27, 2024. Status post 1 cycles.   INTERVAL HISTORY: Edward [name] 83 y.o. male returns to the clinic today for a follow-up visit.  The patient was last seen in the clinic on 08/22/2024 by Dr. Sherrod.  The patient has well differentiated neuro-endocrine tumor.  He is status post 1 cycle and tolerated it fair.   He is scheduled for Port-A-Cath next week.   He is neutropenic today.   He also is having some nausea and poor appetite today. He is not eating and drinking a lot. He had an episode of right-sided pain which he believes is due to a kidney stone. This was transient and has resolved at this time. He had very tiny kidney stones incidentally noted on imaging several weeks ago.   He has a history of hospitalization for severe infection with complications including poor oral intake, fatigue, tachycardia, pneumothorax, and pneumonia. Since discharge, his respiratory status has improved, with only occasional cough and no worsening dyspnea. He denies fevers, chills, or night sweats  He believes one stone may have passed previously, and pain had not recurred until today. He denies dysuria, cloudy urine, abnormal urine odor, and describes his urine as light yellow. He has undergone prior  lithotripsy in the past. He saw Dr. Carolee.   He reports significant unintentional weight loss, with current weight in the 150s, down from the 170s, and decreased oral intake. He inconsistently consumes nutritional supplements and has previously met with a nutritionist. He denies current diarrhea or constipation, but had several days of diarrhea post-chemotherapy, which resolved with loperamide.  He experiences intermittent nausea, with new onset of nausea and gagging today. He has been prescribed ondansetron  and prochlorperazine  for nausea, but is unsure if ondansetron  was filled. He has not vomited but has had episodes of gagging. He denies burning with urination, abnormal urine color, or odor.  He has a history of bradycardia and was advised to follow up with cardiology, but has not yet been evaluated.  He is currently undergoing radiation to the chest and his last day radiation is tentatively scheduled for 10/03/2024. He is here for evaluation and a one week follow up toxicity check today.     MEDICAL HISTORY: Past Medical History:  Diagnosis Date   BPH (benign prostatic hyperplasia)    Cataract    surgical removal bilateral   CKD (chronic kidney disease) stage 3, GFR 30-59 ml/min (HCC)    DM (diabetes mellitus), type 2 (HCC)    ED (erectile dysfunction)    GERD (gastroesophageal reflux disease)    History of adenomatous polyp of colon 11/02/2017   Tubular adenoma 2013; recommended 5 year follow up   History of kidney stones    Hyperlipidemia    Hypertension    IBS (irritable bowel syndrome)    Kidney stones 02/11/2020   LBBB (left bundle  branch block)    Lung nodule    Personal history of kidney stones    Vitamin D  deficiency     ALLERGIES:  is allergic to bystolic [nebivolol hcl].  MEDICATIONS:  Current Outpatient Medications  Medication Sig Dispense Refill   albuterol  (ACCUNEB ) 1.25 MG/3ML nebulizer solution Inhale 3 mL (1.25 mg total) by nebulization every 6 (six) hours  as needed for wheezing. (Patient not taking: Reported on 08/15/2024) 75 mL 12   albuterol  (VENTOLIN  HFA) 108 (90 Base) MCG/ACT inhaler Inhale 2 puffs into the lungs every 4 (four) hours as needed for wheezing or shortness of breath. 6.7 g 2   amoxicillin -clavulanate (AUGMENTIN ) 875-125 MG tablet Take 1 tablet by mouth every 12 (twelve) hours for 28 days. 56 tablet 0   APPLE CIDER VINEGAR PO Take 1-2 tablets by mouth daily.     ascorbic acid (VITAMIN C) 500 MG tablet Take 500-1,000 mg by mouth daily.     aspirin  81 MG tablet Take 81 mg by mouth 3 (three) times a week.     atorvastatin  (LIPITOR) 40 MG tablet Take 1 tablet (40 mg total) by mouth every evening for cholesterol 270 tablet 0   Cholecalciferol (VITAMIN D3) 125 MCG (5000 UT) CAPS Take 5,000 Units by mouth daily.     CINNAMON PO Take 1-2 capsules by mouth daily.     Cyanocobalamin (VITAMIN B 12 PO) Take 1 tablet by mouth daily.     famotidine  (PEPCID ) 40 MG tablet Take 1 tablet (40 mg total) by mouth every evening to prevent heartburn and indigestion. (Patient taking differently: Take 40 mg by mouth See admin instructions. Take 40 mg by mouth in the evening as needed for reflux or indigestion) 90 tablet 0   fluticasone  (FLONASE ) 50 MCG/ACT nasal spray Place 1-2 sprays into both nostrils daily. (Patient taking differently: Place 1-2 sprays into both nostrils daily as needed for allergies or rhinitis.) 16 g 1   glucose blood (ONETOUCH VERIO) test strip USE TO TEST BLOOD SUGAR ONCE DAILY 100 strip 12   glucose blood test strip Use 1 strip to check glucose once daily. 100 each 12   lidocaine -prilocaine  (EMLA ) cream Apply to affected area once 30 g 3   losartan  (COZAAR ) 50 MG tablet Take 1 tablet (50 mg total) by mouth daily for blood pressure. 90 tablet 0   metFORMIN  (GLUCOPHAGE ) 500 MG tablet Take 1 tablet (500 mg total) by mouth 3 (three) times daily with meals for diabetes. 270 tablet 0   metoprolol  tartrate (LOPRESSOR ) 25 MG tablet Take 0.5  tablets (12.5 mg total) by mouth 2 (two) times daily. 90 tablet 0   OMEGA-3 FATTY ACIDS PO Take 1 capsule by mouth 3 (three) times a week.     ondansetron  (ZOFRAN ) 8 MG tablet Take 1 tablet (8 mg total) by mouth every 8 (eight) hours as needed for nausea or vomiting. Start on third day after chemotherapy. 30 tablet 1   pantoprazole  (PROTONIX ) 40 MG tablet Take 1 tablet (40 mg total) by mouth every evening to prevent heartburn and indigestion. (Patient taking differently: Take 40 mg by mouth See admin instructions. Take 40 mg by mouth 30 minutes before breakfast as needed for reflux or indigestion) 90 tablet 0   prochlorperazine  (COMPAZINE ) 10 MG tablet Take 1 tablet (10 mg total) by mouth every 6 (six) hours as needed for nausea or vomiting (Nausea or vomiting). 30 tablet 1   SYSTANE COMPLETE PF 0.6 % SOLN Place 1 drop into both eyes 3 (  three) times daily as needed (for dryness or irritation).     tamsulosin  (FLOMAX ) 0.4 MG CAPS capsule Take 1 capsule (0.4 mg total) by mouth every evening for prostate. 90 capsule 0   Tiotropium Bromide-Olodaterol (STIOLTO RESPIMAT ) 2.5-2.5 MCG/ACT AERS Inhale 2 puffs into the lungs daily. (Patient not taking: Reported on 08/15/2024) 4 g 6   zinc gluconate 50 MG tablet Take 50 mg by mouth daily.     No current facility-administered medications for this visit.    SURGICAL HISTORY:  Past Surgical History:  Procedure Laterality Date   CATARACT EXTRACTION Right 2015   CATARACT EXTRACTION W/ INTRAOCULAR LENS IMPLANT  2013   left   COLONOSCOPY  2013   TA   ENDOBRONCHIAL ULTRASOUND Bilateral 07/30/2024   Procedure: ENDOBRONCHIAL ULTRASOUND (EBUS);  Surgeon: Isadora Hose, MD;  Location: [facility] ORS;  Service: Pulmonary;  Laterality: Bilateral;   EXTRACORPOREAL SHOCK WAVE LITHOTRIPSY Left 12/11/2017   Procedure: LEFT EXTRACORPOREAL SHOCK WAVE LITHOTRIPSY (ESWL);  Surgeon: Carolee Sherwood JONETTA DOUGLAS, MD;  Location: [facility];  Service: Urology;  Laterality: Left;   KIDNEY STONE  SURGERY     POLYPECTOMY     VIDEO BRONCHOSCOPY WITH ENDOBRONCHIAL ULTRASOUND Bilateral 07/18/2024   Procedure: BRONCHOSCOPY, WITH EBUS;  Surgeon: Catherine Cools, MD;  Location: [facility];  Service: Pulmonary;  Laterality: Bilateral;    REVIEW OF SYSTEMS:   Review of Systems  Constitutional: Positive for fatigue, decreased appetite, and weight loss. Negative for chills and fever.   HENT: Negative for mouth sores, nosebleeds, sore throat and trouble swallowing.   Eyes: Negative for eye problems and icterus.  Respiratory: Improved cough and shortness of breath compared to prior hospitalization. Negative for hemoptysis and wheezing.   Cardiovascular: Negative for chest pain and leg swelling.  Gastrointestinal: Nausea/dry heaving this AM. Negative for abdominal pain, constipation, diarrhea (resolved).  Genitourinary: Negative for bladder incontinence, difficulty urinating, dysuria, frequency and hematuria.   Musculoskeletal: Positive for right flank pain. Negative for gait problem, neck pain and neck stiffness.  Skin: Negative for itching and rash.  Neurological: Negative for dizziness, extremity weakness, gait problem, headaches, light-headedness and seizures.  Hematological: Negative for adenopathy. Does not bruise/bleed easily.  Psychiatric/Behavioral: Negative for confusion, depression and sleep disturbance. The patient is not nervous/anxious.     PHYSICAL EXAMINATION:  There were no vitals taken for this visit.  ECOG PERFORMANCE STATUS: 2  Physical Exam  Constitutional: Oriented to person, place, and time and thin appearing male, and in no distress.  HENT:  Head: Normocephalic and atraumatic.  Mouth/Throat: Oropharynx is clear and moist. No oropharyngeal exudate.  Eyes: Conjunctivae are normal. Right eye exhibits no discharge. Left eye exhibits no discharge. No scleral icterus.  Neck: Normal range of motion. Neck supple.  Cardiovascular: Normal rate, regular rhythm, normal heart  sounds and intact distal pulses.   Pulmonary/Chest: Effort normal and breath sounds normal. No respiratory distress. No wheezes. No rales.  Abdominal: Soft. Bowel sounds are normal. Exhibits no distension and no mass. There is no tenderness.  Musculoskeletal: Normal range of motion. Exhibits no edema.  Lymphadenopathy:    No cervical adenopathy.  Neurological: Positive for head titubation. Alert and oriented to person, place, and time. Exhibits muscle wasting. Gait normal. Coordination normal.  Skin: Skin is warm and dry. No rash noted. Not diaphoretic. No erythema. No pallor.  Psychiatric: Mood, memory and judgment normal.  Vitals reviewed.  LABORATORY DATA: Lab Results  Component Value Date   WBC 8.5 08/22/2024   HGB 13.3 08/22/2024  HCT 41.4 08/22/2024   MCV 88.1 08/22/2024   PLT 398 08/22/2024      Chemistry      Component Value Date/Time   NA 140 08/22/2024 0843   K 4.2 08/22/2024 0843   CL 105 08/22/2024 0843   CO2 25 08/22/2024 0843   BUN 15 08/22/2024 0843   CREATININE 1.08 08/22/2024 0843   CREATININE 1.34 (H) 08/22/2023 1156      Component Value Date/Time   CALCIUM  8.8 (L) 08/22/2024 0843   ALKPHOS 78 08/22/2024 0843   AST 21 08/22/2024 0843   ALT 23 08/22/2024 0843   BILITOT 0.3 08/22/2024 0843       RADIOGRAPHIC STUDIES:  ECHOCARDIOGRAM COMPLETE Result Date: 08/17/2024    ECHOCARDIOGRAM REPORT   Patient Name:   Jashan R Barcelo Date of Exam: 08/17/2024 Medical Rec #:  [id number]         Height:       69.0 in Accession #:    [id number]        Weight:       157.2 lb Date of Birth:  08-10-1941         BSA:          1.865 m Patient Age:    83 years          BP:           110/70 mmHg Patient Gender: M                 HR:           79 bpm. Exam Location:  Inpatient Procedure: 2D Echo, Intracardiac Opacification Agent, Color Doppler and Cardiac            Doppler (Both Spectral and Color Flow Doppler were utilized during            procedure). Indications:    Dyspnea  R06.00  History:        Patient has no prior history of Echocardiogram examinations.                 Risk Factors:Diabetes, Hypertension and Dyslipidemia.  Sonographer:    Tinnie Gosling RDCS Referring Phys: [id number] DANIEL P GOODRICH IMPRESSIONS  1. Left ventricular ejection fraction, by estimation, is 40 to 45%. The left ventricle has mildly decreased function. The left ventricle has no regional wall motion abnormalities. There is mild left ventricular hypertrophy. Left ventricular diastolic parameters are consistent with Grade I diastolic dysfunction (impaired relaxation).  2. Right ventricular systolic function is normal. The right ventricular size is normal.  3. Left atrial size was moderately dilated.  4. A small pericardial effusion is present. The pericardial effusion is anterior to the right ventricle and localized near the right ventricle.  5. The mitral valve is abnormal. Trivial mitral valve regurgitation. No evidence of mitral stenosis.  6. The aortic valve is tricuspid. There is mild calcification of the aortic valve. There is mild thickening of the aortic valve. Aortic valve regurgitation is not visualized. Aortic valve sclerosis is present, with no evidence of aortic valve stenosis.  7. The inferior vena cava is dilated in size with >50% respiratory variability, suggesting right atrial pressure of 8 mmHg. FINDINGS  Left Ventricle: Abnormal septal motion and ventricular ectopy during study. Left ventricular ejection fraction, by estimation, is 40 to 45%. The left ventricle has mildly decreased function. The left ventricle has no regional wall motion abnormalities. Definity  contrast agent was given IV to delineate the left ventricular endocardial borders. Strain  was performed and the global longitudinal strain is indeterminate. The left ventricular internal cavity size was normal in size. There is mild left ventricular hypertrophy. Left ventricular diastolic parameters are consistent with Grade I diastolic  dysfunction (impaired relaxation). Right Ventricle: The right ventricular size is normal. No increase in right ventricular wall thickness. Right ventricular systolic function is normal. Left Atrium: Left atrial size was moderately dilated. Right Atrium: Right atrial size was normal in size. Pericardium: A small pericardial effusion is present. The pericardial effusion is anterior to the right ventricle and localized near the right ventricle. Mitral Valve: The mitral valve is abnormal. There is mild thickening of the mitral valve leaflet(s). Trivial mitral valve regurgitation. No evidence of mitral valve stenosis. Tricuspid Valve: The tricuspid valve is normal in structure. Tricuspid valve regurgitation is mild . No evidence of tricuspid stenosis. Aortic Valve: The aortic valve is tricuspid. There is mild calcification of the aortic valve. There is mild thickening of the aortic valve. Aortic valve regurgitation is not visualized. Aortic valve sclerosis is present, with no evidence of aortic valve stenosis. Pulmonic Valve: The pulmonic valve was normal in structure. Pulmonic valve regurgitation is trivial. No evidence of pulmonic stenosis. Aorta: The aortic root is normal in size and structure. Venous: The inferior vena cava is dilated in size with greater than 50% respiratory variability, suggesting right atrial pressure of 8 mmHg. IAS/Shunts: No atrial level shunt detected by color flow Doppler. Additional Comments: 3D was performed not requiring image post processing on an independent workstation and was indeterminate.  LEFT VENTRICLE PLAX 2D LVIDd:         5.60 cm      Diastology LVIDs:         4.30 cm      LV e' medial:    4.90 cm/s LV PW:         1.20 cm      LV E/e' medial:  10.7 LV IVS:        1.30 cm      LV e' lateral:   7.29 cm/s LVOT diam:     2.30 cm      LV E/e' lateral: 7.2 LV SV:         64 LV SV Index:   34 LVOT Area:     4.15 cm  LV Volumes (MOD) LV vol d, MOD A2C: 133.0 ml LV vol d, MOD A4C: 114.0  ml LV vol s, MOD A2C: 81.1 ml LV vol s, MOD A4C: 80.3 ml LV SV MOD A2C:     51.9 ml LV SV MOD A4C:     114.0 ml LV SV MOD BP:      47.7 ml RIGHT VENTRICLE             IVC RV S prime:     17.60 cm/s  IVC diam: 2.70 cm LEFT ATRIUM             Index        RIGHT ATRIUM           Index LA diam:        4.30 cm 2.31 cm/m   RA Area:     12.90 cm LA Vol (A2C):   56.6 ml 30.35 ml/m  RA Volume:   29.50 ml  15.82 ml/m LA Vol (A4C):   43.6 ml 23.38 ml/m LA Biplane Vol: 50.7 ml 27.19 ml/m  AORTIC VALVE LVOT Vmax:   74.60 cm/s LVOT Vmean:  49.000 cm/s LVOT VTI:  0.153 m  AORTA Ao Root diam: 3.60 cm Ao Asc diam:  3.70 cm MITRAL VALVE               TRICUSPID VALVE MV Area (PHT): 2.86 cm    TR Peak grad:   24.4 mmHg MV E velocity: 52.30 cm/s  TR Vmax:        247.00 cm/s MV A velocity: 76.30 cm/s MV E/A ratio:  0.69        SHUNTS                            Systemic VTI:  0.15 m                            Systemic Diam: 2.30 cm Maude Emmer MD Electronically signed by Maude Emmer MD Signature Date/Time: 08/17/2024/11:25:20 AM    Final    DG Chest 2 View Result Date: 08/15/2024 CLINICAL DATA:  post bronch. rule out pneumothorax EXAM: CHEST - 2 VIEW COMPARISON:  July 30, 2024 FINDINGS: Increasing airspace consolidation in the right lower lobe. Small pleural effusion on the right. No pneumothorax. The right infrahilar lung mass is less conspicuous on today's radiograph due to the airspace disease in the right lung base. Mild cardiomegaly. Aortic atherosclerosis. No acute fracture or destructive lesions. Multilevel thoracic osteophytosis. IMPRESSION: Increasing airspace consolidation in the right lower lobe, possibly worsening atelectasis or airspace disease given the patient's right infrahilar lung mass. Small right pleural effusion. Electronically Signed   By: Rogelia Myers M.D.   On: 08/15/2024 15:29   CT Angio Chest PE W and/or Wo Contrast Result Date: 08/15/2024 CLINICAL DATA:  Pulmonary embolism (PE) suspected,  low to intermediate prob, positive D-dimer EXAM: CT ANGIOGRAPHY CHEST WITH CONTRAST TECHNIQUE: Multidetector CT imaging of the chest was performed using the standard protocol during bolus administration of intravenous contrast. Multiplanar CT image reconstructions and MIPs were obtained to evaluate the vascular anatomy. RADIATION DOSE REDUCTION: This exam was performed according to the departmental dose-optimization program which includes automated exposure control, adjustment of the mA and/or kV according to patient size and/or use of iterative reconstruction technique. CONTRAST:  75mL OMNIPAQUE  IOHEXOL  350 MG/ML SOLN COMPARISON:  08/14/2024, 07/22/2024, 07/02/2024 FINDINGS: Pulmonary Embolism: No pulmonary embolism. Cardiovascular: Mild cardiomegaly. Moderate volume pericardial effusion.No aortic aneurysm. Mediastinum/Nodes: No mediastinal mass.Subcarinal lymph node measuring 1.4 cm (axial 76). Unchanged enlarged lower left paratracheal lymph node. Lungs/Pleura: The midline trachea and bronchi are patent. Redemonstration of a right infrahilar mass, which measures 6 x 7.7 x 6.4 cm (previously, 5.7 x 6.3 x 5.6 cm). The mass is again noted to completely obstruct the right middle lobe bronchus with severe to near complete narrowing of the right lower lobe bronchi. The mass also significantly narrows the right interlobar pulmonary artery in the right middle lobe pulmonary artery with complete occlusion of the superior right pulmonary vein. Interval development of dense airspace consolidation filling the majority of the right lower lobe with unchanged, but significant atelectasis of the right middle lobe. No pneumothorax. Small right pleural effusion. Musculoskeletal: No acute fracture. Similar lucent lesion in the right scapula measuring 1.3 cm, which was not FDG avid on the prior PET-CT. Multilevel degenerative disc disease of the spine. Thoracic DISH. Upper Abdomen: No acute abnormality in the partially visualized  upper abdomen. Reflux of contrast into the hepatic veins. Review of the MIP images confirms the above findings. IMPRESSION: 1. No  pulmonary embolism. 2. Enlarging right infrahilar mass, as measured above. This causes complete obstruction of the right middle lobe bronchus with severe to near-complete narrowing of the right lower lobe bronchi. New dense airspace consolidation filling the majority of the right lower lobe. While this may represent right lower lobe atelectasis, it would be worrisome for postobstructive pneumonia, in the correct clinical context. Small right pleural effusion. 3. Reflux of contrast into the hepatic veins is suggestive of underlying cardiac dysfunction. 4. Redemonstrated multistation mediastinal lymph nodes, with a more prominent subcarinal lymph node, concerning for worsening metastatic disease. Aortic Atherosclerosis (ICD10-I70.0). Electronically Signed   By: Rogelia Myers M.D.   On: 08/15/2024 15:26   VAS US  LOWER EXTREMITY VENOUS (DVT) (ONLY [facility]) Result Date: 08/15/2024  Lower Venous DVT Study Patient Name:  Edward [name]  Date of Exam:   08/15/2024 Medical Rec #: [id number]          Accession #:    [id number] Date of Birth: [birth year]          Patient Gender: M Patient Age:   74 years Exam Location:  [facility] Procedure:      VAS US  LOWER EXTREMITY VENOUS (DVT) Referring Phys: MAUDE MESSICK --------------------------------------------------------------------------------  Indications: SOB. Other Indications: History of lung cancer. Risk Factors: COPD. Comparison Study: No priors. Performing Technologist: Ricka Sturdivant-Jones RDMS, RVT  Examination Guidelines: A complete evaluation includes B-mode imaging, spectral Doppler, color Doppler, and power Doppler as needed of all accessible portions of each vessel. Bilateral testing is considered an integral part of a complete examination. Limited examinations for reoccurring indications may be performed as noted. The  reflux portion of the exam is performed with the patient in reverse Trendelenburg.  +---------+---------------+---------+-----------+----------+--------------+ RIGHT    CompressibilityPhasicitySpontaneityPropertiesThrombus Aging +---------+---------------+---------+-----------+----------+--------------+ CFV      Full           Yes      Yes                                 +---------+---------------+---------+-----------+----------+--------------+ SFJ      Full                                                        +---------+---------------+---------+-----------+----------+--------------+ FV Prox  Full                                                        +---------+---------------+---------+-----------+----------+--------------+ FV Mid   Full           Yes      Yes                                 +---------+---------------+---------+-----------+----------+--------------+ FV DistalFull                                                        +---------+---------------+---------+-----------+----------+--------------+ PFV  Full                                                        +---------+---------------+---------+-----------+----------+--------------+ POP      Full           Yes      Yes                                 +---------+---------------+---------+-----------+----------+--------------+ PTV      Full                                                        +---------+---------------+---------+-----------+----------+--------------+ PERO     Full                                                        +---------+---------------+---------+-----------+----------+--------------+   +---------+---------------+---------+-----------+----------+--------------+ LEFT     CompressibilityPhasicitySpontaneityPropertiesThrombus Aging +---------+---------------+---------+-----------+----------+--------------+ CFV      Full           Yes       Yes                                 +---------+---------------+---------+-----------+----------+--------------+ SFJ      Full                                                        +---------+---------------+---------+-----------+----------+--------------+ FV Prox  Full                                                        +---------+---------------+---------+-----------+----------+--------------+ FV Mid   Full           No       Yes                                 +---------+---------------+---------+-----------+----------+--------------+ FV DistalFull                                                        +---------+---------------+---------+-----------+----------+--------------+ PFV      Full                                                        +---------+---------------+---------+-----------+----------+--------------+  POP      Full           Yes      Yes                                 +---------+---------------+---------+-----------+----------+--------------+ PTV      Full                                                        +---------+---------------+---------+-----------+----------+--------------+ PERO     Full                                                        +---------+---------------+---------+-----------+----------+--------------+     Summary: BILATERAL: - No evidence of deep vein thrombosis seen in the lower extremities, bilaterally. -No evidence of popliteal cyst, bilaterally.   *See table(s) above for measurements and observations. Electronically signed by Debby Robertson on 08/15/2024 at 2:26:10 PM.    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  He presented with a right suprahilar mass with occlusion of the right middle lobe and constriction of the right lower lobe  bronchus in addition to hypermetabolic lymph node extending into the contralateral mediastinum and possible left internal jugular station.  The patient was diagnosed in November 2025.  He is currently undergoing radiation to the obstructed bronchus.  Dr. Sherrod had previously recommended chemotherapy with carboplatin  for an AUC of 5 on day 1, etoposide  100 mg/m on days 1, 2, and 3 IV every 3 weeks for 4 cycles.  He is status post 1 cycle.  The patient was seen with Dr. Sherrod. Labs were reviewed. His WBC is 0.8 and ANC 0.5. We will arrange for 3 days of zarxio . Will admisiter the first today. No PA required.  We will also arrange IVF and IV zofran  due to his dry heaving and poor p.o intake.   We will see him back in 2 weeks before undergoing cycle #2.  Cancer-related weight loss and malnutrition Significant weight loss and decreased oral intake. Improved nutrition essential for recovery and treatment tolerance. - Encouraged increased intake of protein/calorie supplements (Boost or Ensure), targeting 1-2 per day. - Recommended small, frequent meals to increase caloric intake. - Reinforced importance of nutrition in supporting recovery and treatment tolerance.  He had a transient episode of right flank pain this AM. Recent imaging did not show significant stone burden. Unclear if related to kidney stone. No abdominal tenderness to palpation or CVA tenderness. He will monitor for now. If concerns for recurrent stone, he will reach out to his urologist for evaluation. Administered IV fluids in clinic for hydration and symptom relief.  Nausea and vomiting due to chemotherapy New onset nausea and dry heaving managed in clinic. Education provided on antiemetic timing and dosing. - Resent prescriptions for ondansetron  and prochlorperazine  to pharmacy. - Provided education on antiemetic use: ondansetron  to start on third day after chemotherapy, prochlorperazine  every six hours as needed. -  Administered  IV fluids and antiemetic in clinic for acute symptoms. - Instructed to pick up and use antiemetics as needed at home.  - Confirmed port placement scheduled for next week to facilitate ongoing therapy and blood draws.  - Provided infection prevention counseling: masking in crowds, careful washing of produce, and avoiding contact with ill individuals, etc.  - Instructed to report signs of infection promptly. - Recommended acetaminophen  for bone pain/aches from GCSF injections and claritin.   The patient was advised to call immediately if he has any concerning symptoms in the interval. The patient voices understanding of current disease status and treatment options and is in agreement with the current care plan. All questions were answered. The patient knows to call the clinic with any problems, questions or concerns. We can certainly see the patient much sooner if necessary    No orders of the defined types were placed in this encounter.    Jazlin Tapscott L Charlis Harner, PA-C 08/30/2024  ADDENDUM: Hematology/Oncology Attending:  I had a face-to-face encounter with the patient today.  I reviewed his record, lab and recommended his care plan.  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  The patient is currently undergoing a course of radiotherapy concurrent with systemic chemotherapy with carboplatin  and etoposide  status post 1 cycle.  He tolerated the first cycle of his treatment fairly well with no concerning complaints except for fatigue.  Today he started having some pain on the right side of his abdomen and flank area.  He has a history of kidney stones that was seen on previous imaging studies but there were none obstructive. He denied having any dysuria or hematuria. The patient has neutropenia secondary to his chemotherapy.  We will arrange for him to receive G-CSF injection for the next few days. For the  weakness and dehydration, we will arrange for the patient to receive 1 L of normal saline with Zofran  IV in the clinic today.  For the abdominal pain, we will continue to monitor for now and if it gets any worse he may need to go to the emergency department or check with his urologist for further evaluation. The patient was advised to call immediately if he has any other concerning symptoms in the interval. Disclaimer: This note was dictated with voice recognition software. Similar sounding words can inadvertently be transcribed and may be missed upon review. Sherrod MARLA Sherrod, MD

## 2024-08-30 NOTE — Telephone Encounter (Signed)
 Called pt and informed him pcp would like for him to stay on both BP medications until seen, pt verbalized understanding

## 2024-09-01 ENCOUNTER — Ambulatory Visit
Admission: RE | Admit: 2024-09-01 | Discharge: 2024-09-01 | Attending: Radiation Oncology | Admitting: Radiation Oncology

## 2024-09-01 ENCOUNTER — Other Ambulatory Visit: Payer: Self-pay

## 2024-09-01 DIAGNOSIS — Z5111 Encounter for antineoplastic chemotherapy: Secondary | ICD-10-CM | POA: Diagnosis not present

## 2024-09-01 LAB — RAD ONC ARIA SESSION SUMMARY
Course Elapsed Days: 16
Plan Fractions Treated to Date: 12
Plan Prescribed Dose Per Fraction: 2 Gy
Plan Total Fractions Prescribed: 30
Plan Total Prescribed Dose: 60 Gy
Reference Point Dosage Given to Date: 24 Gy
Reference Point Session Dosage Given: 2 Gy
Session Number: 12

## 2024-09-02 ENCOUNTER — Ambulatory Visit

## 2024-09-02 ENCOUNTER — Ambulatory Visit: Payer: PPO | Admitting: Internal Medicine

## 2024-09-02 ENCOUNTER — Telehealth: Payer: Self-pay | Admitting: Dietician

## 2024-09-02 ENCOUNTER — Inpatient Hospital Stay

## 2024-09-02 ENCOUNTER — Telehealth: Payer: Self-pay

## 2024-09-02 ENCOUNTER — Other Ambulatory Visit: Payer: Self-pay

## 2024-09-02 DIAGNOSIS — Z5111 Encounter for antineoplastic chemotherapy: Secondary | ICD-10-CM | POA: Diagnosis not present

## 2024-09-02 LAB — RAD ONC ARIA SESSION SUMMARY
Course Elapsed Days: 17
Plan Fractions Treated to Date: 13
Plan Prescribed Dose Per Fraction: 2 Gy
Plan Total Fractions Prescribed: 30
Plan Total Prescribed Dose: 60 Gy
Reference Point Dosage Given to Date: 26 Gy
Reference Point Session Dosage Given: 2 Gy
Session Number: 13

## 2024-09-02 NOTE — Telephone Encounter (Signed)
 Received message response from primary care [facility] NP-C) in regards to Jardiance . Per primary, patient may stop Jardiance  at this time. She will plan to see patient as scheduled for diabetes check-up. Patient contacted via telephone and updated. He is understanding of plan.

## 2024-09-02 NOTE — Telephone Encounter (Signed)
 LM for patient that this nurse was calling to see how they were doing after their treatment. Please call back to Dr. Asa Lente nurse at [phone] if they have any questions or concerns regarding the treatment.

## 2024-09-02 NOTE — Telephone Encounter (Signed)
-----   Message from Nurse Lavanda RAMAN, RN sent at 08/28/2024  3:13 PM EST ----- Regarding: First time carbo and etoposide  First time carboplatin  and etoposide   Dr. Sherrod pt Tolerated well.

## 2024-09-03 ENCOUNTER — Other Ambulatory Visit (HOSPITAL_COMMUNITY): Payer: Self-pay

## 2024-09-03 ENCOUNTER — Ambulatory Visit
Admission: RE | Admit: 2024-09-03 | Discharge: 2024-09-03 | Disposition: A | Discharge status: Home / Self Care | Source: Ambulatory Visit | Attending: Radiation Oncology | Admitting: Radiation Oncology

## 2024-09-03 ENCOUNTER — Other Ambulatory Visit: Payer: Self-pay

## 2024-09-03 DIAGNOSIS — C7A8 Other malignant neuroendocrine tumors: Secondary | ICD-10-CM

## 2024-09-03 LAB — RAD ONC ARIA SESSION SUMMARY
Course Elapsed Days: 18
Plan Fractions Treated to Date: 14
Plan Prescribed Dose Per Fraction: 2 Gy
Plan Total Fractions Prescribed: 30
Plan Total Prescribed Dose: 60 Gy
Reference Point Dosage Given to Date: 28 Gy
Reference Point Session Dosage Given: 2 Gy
Session Number: 14

## 2024-09-03 MED ORDER — SONAFINE EX EMUL
1.0000 | Freq: Once | CUTANEOUS | Status: AC
  Administered 2024-09-03: 1 via TOPICAL

## 2024-09-04 ENCOUNTER — Encounter: Payer: Self-pay | Admitting: Physician Assistant

## 2024-09-04 ENCOUNTER — Encounter: Payer: Self-pay | Admitting: Internal Medicine

## 2024-09-04 ENCOUNTER — Other Ambulatory Visit: Payer: Self-pay

## 2024-09-04 ENCOUNTER — Ambulatory Visit
Admission: RE | Admit: 2024-09-04 | Discharge: 2024-09-04 | Disposition: A | Source: Ambulatory Visit | Attending: Radiation Oncology | Admitting: Radiation Oncology

## 2024-09-04 ENCOUNTER — Other Ambulatory Visit: Payer: Self-pay | Admitting: Medical Oncology

## 2024-09-04 ENCOUNTER — Inpatient Hospital Stay

## 2024-09-04 ENCOUNTER — Other Ambulatory Visit (HOSPITAL_COMMUNITY): Payer: Self-pay

## 2024-09-04 ENCOUNTER — Inpatient Hospital Stay: Admitting: Physician Assistant

## 2024-09-04 VITALS — BP 118/57 | HR 46 | Temp 98.0°F | Resp 18 | Wt 157.5 lb

## 2024-09-04 VITALS — BP 120/52 | HR 44 | Resp 16

## 2024-09-04 DIAGNOSIS — D701 Agranulocytosis secondary to cancer chemotherapy: Secondary | ICD-10-CM

## 2024-09-04 DIAGNOSIS — T451X5A Adverse effect of antineoplastic and immunosuppressive drugs, initial encounter: Secondary | ICD-10-CM | POA: Diagnosis not present

## 2024-09-04 DIAGNOSIS — R638 Other symptoms and signs concerning food and fluid intake: Secondary | ICD-10-CM

## 2024-09-04 DIAGNOSIS — C7A8 Other malignant neuroendocrine tumors: Secondary | ICD-10-CM

## 2024-09-04 LAB — CBC WITH DIFFERENTIAL (CANCER CENTER ONLY)
Abs Immature Granulocytes: 0.01 K/uL (ref 0.00–0.07)
Basophils Absolute: 0 K/uL (ref 0.0–0.1)
Basophils Relative: 1 %
Eosinophils Absolute: 0 K/uL (ref 0.0–0.5)
Eosinophils Relative: 5 %
HCT: 37 % — ABNORMAL LOW (ref 39.0–52.0)
Hemoglobin: 12.2 g/dL — ABNORMAL LOW (ref 13.0–17.0)
Immature Granulocytes: 1 %
Lymphocytes Relative: 16 %
Lymphs Abs: 0.1 K/uL — ABNORMAL LOW (ref 0.7–4.0)
MCH: 28.6 pg (ref 26.0–34.0)
MCHC: 33 g/dL (ref 30.0–36.0)
MCV: 86.7 fL (ref 80.0–100.0)
Monocytes Absolute: 0 K/uL — ABNORMAL LOW (ref 0.1–1.0)
Monocytes Relative: 1 %
Neutro Abs: 0.6 K/uL — ABNORMAL LOW (ref 1.7–7.7)
Neutrophils Relative %: 76 %
Platelet Count: 111 K/uL — ABNORMAL LOW (ref 150–400)
RBC: 4.27 MIL/uL (ref 4.22–5.81)
RDW: 13.9 % (ref 11.5–15.5)
Smear Review: NORMAL
WBC Count: 0.8 K/uL — CL (ref 4.0–10.5)
nRBC: 0 % (ref 0.0–0.2)

## 2024-09-04 LAB — RAD ONC ARIA SESSION SUMMARY
Course Elapsed Days: 19
Plan Fractions Treated to Date: 15
Plan Prescribed Dose Per Fraction: 2 Gy
Plan Total Fractions Prescribed: 30
Plan Total Prescribed Dose: 60 Gy
Reference Point Dosage Given to Date: 30 Gy
Reference Point Session Dosage Given: 2 Gy
Session Number: 15

## 2024-09-04 LAB — CMP (CANCER CENTER ONLY)
ALT: 9 U/L (ref 0–44)
AST: 12 U/L — ABNORMAL LOW (ref 15–41)
Albumin: 3.3 g/dL — ABNORMAL LOW (ref 3.5–5.0)
Alkaline Phosphatase: 63 U/L (ref 38–126)
Anion gap: 8 (ref 5–15)
BUN: 14 mg/dL (ref 8–23)
CO2: 27 mmol/L (ref 22–32)
Calcium: 8.1 mg/dL — ABNORMAL LOW (ref 8.9–10.3)
Chloride: 105 mmol/L (ref 98–111)
Creatinine: 1.15 mg/dL (ref 0.61–1.24)
GFR, Estimated: >60 mL/min
Glucose, Bld: 146 mg/dL — ABNORMAL HIGH (ref 70–99)
Potassium: 3.7 mmol/L (ref 3.5–5.1)
Sodium: 140 mmol/L (ref 135–145)
Total Bilirubin: 0.5 mg/dL (ref 0.0–1.2)
Total Protein: 5.7 g/dL — ABNORMAL LOW (ref 6.5–8.1)

## 2024-09-04 MED ORDER — ONDANSETRON HCL 4 MG/2ML IJ SOLN
8.0000 mg | Freq: Once | INTRAMUSCULAR | Status: AC
  Administered 2024-09-04: 8 mg via INTRAVENOUS
  Filled 2024-09-04: qty 4

## 2024-09-04 MED ORDER — SODIUM CHLORIDE 0.9 % IV SOLN: Freq: Once | INTRAVENOUS | Status: AC

## 2024-09-04 MED ORDER — ONDANSETRON HCL 8 MG PO TABS
8.0000 mg | ORAL_TABLET | Freq: Three times a day (TID) | ORAL | 1 refills | Status: AC | PRN
  Filled 2024-09-04 (×2): qty 30, 10d supply, fill #0
  Filled 2024-10-12: qty 30, 10d supply, fill #1

## 2024-09-04 MED ORDER — PROCHLORPERAZINE MALEATE 10 MG PO TABS
10.0000 mg | ORAL_TABLET | Freq: Four times a day (QID) | ORAL | 1 refills | Status: DC | PRN
  Filled 2024-09-04 (×2): qty 30, 8d supply, fill #0

## 2024-09-04 MED ORDER — FILGRASTIM-SNDZ 300 MCG/0.5ML IJ SOSY
300.0000 ug | PREFILLED_SYRINGE | Freq: Once | INTRAMUSCULAR | Status: AC
  Administered 2024-09-04: 300 ug via SUBCUTANEOUS
  Filled 2024-09-04: qty 0.5

## 2024-09-04 NOTE — Progress Notes (Signed)
 CRITICAL VALUE STICKER  CRITICAL VALUE:WBC=0.8, ANC=0.5  RECEIVER (on-site recipient of call): Jackalynn Art, RN DATE & TIME NOTIFIED: 09/04/24 @0915   MESSENGER (representative from lab): Heather  MD NOTIFIED: Cassie Heilingoetter, PA_C  TIME OF NOTIFICATION:0915  RESPONSE:  Neutropenic precautions.Cassie is seeing pt today

## 2024-09-04 NOTE — Progress Notes (Signed)
 OK per Cassie PA-C to infuse IVFs over an hour and a half today. Per Cassie PA-C OK to discharge Pt home with BP 120/52 and HR 44.

## 2024-09-04 NOTE — Patient Instructions (Signed)

## 2024-09-06 ENCOUNTER — Other Ambulatory Visit: Payer: Self-pay

## 2024-09-06 ENCOUNTER — Inpatient Hospital Stay

## 2024-09-06 ENCOUNTER — Emergency Department (HOSPITAL_COMMUNITY)

## 2024-09-06 ENCOUNTER — Encounter (HOSPITAL_COMMUNITY): Payer: Self-pay

## 2024-09-06 ENCOUNTER — Inpatient Hospital Stay (HOSPITAL_COMMUNITY)
Admission: EM | Admit: 2024-09-06 | Discharge: 2024-09-16 | DRG: 808 | Disposition: A | Discharge status: Home / Self Care | Source: Ambulatory Visit | Attending: Internal Medicine | Admitting: Internal Medicine

## 2024-09-06 ENCOUNTER — Other Ambulatory Visit: Payer: Self-pay | Admitting: Radiology

## 2024-09-06 VITALS — BP 140/109 | HR 44 | Temp 98.8°F | Resp 20

## 2024-09-06 DIAGNOSIS — Z6823 Body mass index (BMI) 23.0-23.9, adult: Secondary | ICD-10-CM

## 2024-09-06 DIAGNOSIS — Z7984 Long term (current) use of oral hypoglycemic drugs: Secondary | ICD-10-CM

## 2024-09-06 DIAGNOSIS — E860 Dehydration: Secondary | ICD-10-CM | POA: Diagnosis present

## 2024-09-06 DIAGNOSIS — I130 Hypertensive heart and chronic kidney disease with heart failure and stage 1 through stage 4 chronic kidney disease, or unspecified chronic kidney disease: Secondary | ICD-10-CM | POA: Diagnosis present

## 2024-09-06 DIAGNOSIS — A0472 Enterocolitis due to Clostridium difficile, not specified as recurrent: Secondary | ICD-10-CM | POA: Diagnosis present

## 2024-09-06 DIAGNOSIS — J700 Acute pulmonary manifestations due to radiation: Secondary | ICD-10-CM | POA: Diagnosis present

## 2024-09-06 DIAGNOSIS — C3491 Malignant neoplasm of unspecified part of right bronchus or lung: Secondary | ICD-10-CM

## 2024-09-06 DIAGNOSIS — Z515 Encounter for palliative care: Secondary | ICD-10-CM

## 2024-09-06 DIAGNOSIS — R531 Weakness: Secondary | ICD-10-CM | POA: Diagnosis not present

## 2024-09-06 DIAGNOSIS — E785 Hyperlipidemia, unspecified: Secondary | ICD-10-CM | POA: Diagnosis present

## 2024-09-06 DIAGNOSIS — E876 Hypokalemia: Secondary | ICD-10-CM | POA: Diagnosis present

## 2024-09-06 DIAGNOSIS — Z66 Do not resuscitate: Secondary | ICD-10-CM | POA: Diagnosis present

## 2024-09-06 DIAGNOSIS — Z961 Presence of intraocular lens: Secondary | ICD-10-CM | POA: Diagnosis present

## 2024-09-06 DIAGNOSIS — K30 Functional dyspepsia: Secondary | ICD-10-CM | POA: Diagnosis present

## 2024-09-06 DIAGNOSIS — D61810 Antineoplastic chemotherapy induced pancytopenia: Secondary | ICD-10-CM | POA: Diagnosis present

## 2024-09-06 DIAGNOSIS — D6959 Other secondary thrombocytopenia: Secondary | ICD-10-CM | POA: Diagnosis present

## 2024-09-06 DIAGNOSIS — K219 Gastro-esophageal reflux disease without esophagitis: Secondary | ICD-10-CM | POA: Diagnosis not present

## 2024-09-06 DIAGNOSIS — Z833 Family history of diabetes mellitus: Secondary | ICD-10-CM

## 2024-09-06 DIAGNOSIS — Z860101 Personal history of adenomatous and serrated colon polyps: Secondary | ICD-10-CM

## 2024-09-06 DIAGNOSIS — T451X5A Adverse effect of antineoplastic and immunosuppressive drugs, initial encounter: Secondary | ICD-10-CM | POA: Diagnosis present

## 2024-09-06 DIAGNOSIS — J189 Pneumonia, unspecified organism: Secondary | ICD-10-CM | POA: Diagnosis present

## 2024-09-06 DIAGNOSIS — E1122 Type 2 diabetes mellitus with diabetic chronic kidney disease: Secondary | ICD-10-CM | POA: Diagnosis present

## 2024-09-06 DIAGNOSIS — D3A8 Other benign neuroendocrine tumors: Secondary | ICD-10-CM | POA: Diagnosis present

## 2024-09-06 DIAGNOSIS — I5021 Acute systolic (congestive) heart failure: Secondary | ICD-10-CM | POA: Diagnosis not present

## 2024-09-06 DIAGNOSIS — D696 Thrombocytopenia, unspecified: Secondary | ICD-10-CM | POA: Diagnosis not present

## 2024-09-06 DIAGNOSIS — C3411 Malignant neoplasm of upper lobe, right bronchus or lung: Secondary | ICD-10-CM | POA: Diagnosis not present

## 2024-09-06 DIAGNOSIS — Z7189 Other specified counseling: Secondary | ICD-10-CM | POA: Diagnosis not present

## 2024-09-06 DIAGNOSIS — Z87891 Personal history of nicotine dependence: Secondary | ICD-10-CM | POA: Diagnosis not present

## 2024-09-06 DIAGNOSIS — J690 Pneumonitis due to inhalation of food and vomit: Secondary | ICD-10-CM | POA: Diagnosis present

## 2024-09-06 DIAGNOSIS — D701 Agranulocytosis secondary to cancer chemotherapy: Principal | ICD-10-CM | POA: Diagnosis present

## 2024-09-06 DIAGNOSIS — E46 Unspecified protein-calorie malnutrition: Secondary | ICD-10-CM | POA: Diagnosis present

## 2024-09-06 DIAGNOSIS — R627 Adult failure to thrive: Secondary | ICD-10-CM | POA: Diagnosis present

## 2024-09-06 DIAGNOSIS — N183 Chronic kidney disease, stage 3 unspecified: Secondary | ICD-10-CM | POA: Diagnosis present

## 2024-09-06 DIAGNOSIS — K224 Dyskinesia of esophagus: Secondary | ICD-10-CM | POA: Diagnosis present

## 2024-09-06 DIAGNOSIS — N1831 Chronic kidney disease, stage 3a: Secondary | ICD-10-CM

## 2024-09-06 DIAGNOSIS — J9601 Acute respiratory failure with hypoxia: Secondary | ICD-10-CM | POA: Diagnosis present

## 2024-09-06 DIAGNOSIS — C3490 Malignant neoplasm of unspecified part of unspecified bronchus or lung: Secondary | ICD-10-CM | POA: Diagnosis present

## 2024-09-06 DIAGNOSIS — R001 Bradycardia, unspecified: Secondary | ICD-10-CM | POA: Diagnosis present

## 2024-09-06 DIAGNOSIS — Z87442 Personal history of urinary calculi: Secondary | ICD-10-CM

## 2024-09-06 DIAGNOSIS — A419 Sepsis, unspecified organism: Secondary | ICD-10-CM

## 2024-09-06 DIAGNOSIS — Z8249 Family history of ischemic heart disease and other diseases of the circulatory system: Secondary | ICD-10-CM

## 2024-09-06 DIAGNOSIS — K589 Irritable bowel syndrome without diarrhea: Secondary | ICD-10-CM | POA: Diagnosis present

## 2024-09-06 DIAGNOSIS — R4589 Other symptoms and signs involving emotional state: Secondary | ICD-10-CM | POA: Diagnosis not present

## 2024-09-06 DIAGNOSIS — Z79899 Other long term (current) drug therapy: Secondary | ICD-10-CM

## 2024-09-06 DIAGNOSIS — Z711 Person with feared health complaint in whom no diagnosis is made: Secondary | ICD-10-CM | POA: Diagnosis not present

## 2024-09-06 DIAGNOSIS — I447 Left bundle-branch block, unspecified: Secondary | ICD-10-CM | POA: Diagnosis present

## 2024-09-06 DIAGNOSIS — Z9842 Cataract extraction status, left eye: Secondary | ICD-10-CM

## 2024-09-06 DIAGNOSIS — D709 Neutropenia, unspecified: Secondary | ICD-10-CM | POA: Diagnosis present

## 2024-09-06 DIAGNOSIS — R079 Chest pain, unspecified: Secondary | ICD-10-CM

## 2024-09-06 DIAGNOSIS — N400 Enlarged prostate without lower urinary tract symptoms: Secondary | ICD-10-CM | POA: Diagnosis present

## 2024-09-06 DIAGNOSIS — Z9841 Cataract extraction status, right eye: Secondary | ICD-10-CM

## 2024-09-06 DIAGNOSIS — I493 Ventricular premature depolarization: Secondary | ICD-10-CM | POA: Diagnosis present

## 2024-09-06 DIAGNOSIS — I5042 Chronic combined systolic (congestive) and diastolic (congestive) heart failure: Secondary | ICD-10-CM | POA: Diagnosis present

## 2024-09-06 DIAGNOSIS — R5381 Other malaise: Secondary | ICD-10-CM | POA: Diagnosis present

## 2024-09-06 DIAGNOSIS — Z888 Allergy status to other drugs, medicaments and biological substances status: Secondary | ICD-10-CM

## 2024-09-06 DIAGNOSIS — R197 Diarrhea, unspecified: Secondary | ICD-10-CM | POA: Diagnosis not present

## 2024-09-06 HISTORY — DX: Malignant (primary) neoplasm, unspecified: C80.1

## 2024-09-06 LAB — RESP PANEL BY RT-PCR (RSV, FLU A&B, COVID)  RVPGX2
Influenza A by PCR: NEGATIVE
Influenza B by PCR: NEGATIVE
Resp Syncytial Virus by PCR: NEGATIVE
SARS Coronavirus 2 by RT PCR: NEGATIVE

## 2024-09-06 LAB — TROPONIN T, HIGH SENSITIVITY
Troponin T High Sensitivity: 33 ng/L — ABNORMAL HIGH (ref 0–19)
Troponin T High Sensitivity: 36 ng/L — ABNORMAL HIGH (ref 0–19)

## 2024-09-06 LAB — CBC
HCT: 37.4 % — ABNORMAL LOW (ref 39.0–52.0)
Hemoglobin: 12.1 g/dL — ABNORMAL LOW (ref 13.0–17.0)
MCH: 28.6 pg (ref 26.0–34.0)
MCHC: 32.4 g/dL (ref 30.0–36.0)
MCV: 88.4 fL (ref 80.0–100.0)
Platelets: 46 K/uL — ABNORMAL LOW (ref 150–400)
RBC: 4.23 MIL/uL (ref 4.22–5.81)
RDW: 13.6 % (ref 11.5–15.5)
WBC: 0.1 K/uL — CL (ref 4.0–10.5)
nRBC: 0 % (ref 0.0–0.2)

## 2024-09-06 LAB — DIFFERENTIAL
Abs Immature Granulocytes: 0.02 K/uL (ref 0.00–0.07)
Basophils Absolute: 0 K/uL (ref 0.0–0.1)
Basophils Relative: 6 %
Eosinophils Absolute: 0 K/uL (ref 0.0–0.5)
Eosinophils Relative: 0 %
Immature Granulocytes: 13 %
Lymphocytes Relative: 49 %
Lymphs Abs: 0.1 K/uL — ABNORMAL LOW (ref 0.7–4.0)
Monocytes Absolute: 0 K/uL — ABNORMAL LOW (ref 0.1–1.0)
Monocytes Relative: 13 %
Neutro Abs: 0 K/uL — CL (ref 1.7–7.7)
Neutrophils Relative %: 19 %
Smear Review: NORMAL

## 2024-09-06 LAB — PRO BRAIN NATRIURETIC PEPTIDE: Pro Brain Natriuretic Peptide: 2376 pg/mL — ABNORMAL HIGH

## 2024-09-06 LAB — BASIC METABOLIC PANEL WITH GFR
Anion gap: 10 (ref 5–15)
BUN: 12 mg/dL (ref 8–23)
CO2: 27 mmol/L (ref 22–32)
Calcium: 8.3 mg/dL — ABNORMAL LOW (ref 8.9–10.3)
Chloride: 98 mmol/L (ref 98–111)
Creatinine, Ser: 1.03 mg/dL (ref 0.61–1.24)
GFR, Estimated: >60 mL/min
Glucose, Bld: 165 mg/dL — ABNORMAL HIGH (ref 70–99)
Potassium: 3.7 mmol/L (ref 3.5–5.1)
Sodium: 134 mmol/L — ABNORMAL LOW (ref 135–145)

## 2024-09-06 LAB — GLUCOSE, CAPILLARY
Glucose-Capillary: 147 mg/dL — ABNORMAL HIGH (ref 70–99)
Glucose-Capillary: 149 mg/dL — ABNORMAL HIGH (ref 70–99)

## 2024-09-06 LAB — D-DIMER, QUANTITATIVE: D-Dimer, Quant: 1.91 ug{FEU}/mL — ABNORMAL HIGH (ref 0.00–0.50)

## 2024-09-06 MED ORDER — INSULIN ASPART 100 UNIT/ML IJ SOLN
0.0000 [IU] | Freq: Three times a day (TID) | INTRAMUSCULAR | Status: DC
  Filled 2024-09-06: qty 2

## 2024-09-06 MED ORDER — PANTOPRAZOLE SODIUM 40 MG IV SOLR
40.0000 mg | Freq: Once | INTRAVENOUS | Status: AC
  Administered 2024-09-06: 40 mg via INTRAVENOUS
  Filled 2024-09-06: qty 10

## 2024-09-06 MED ORDER — ONDANSETRON HCL 4 MG/2ML IJ SOLN: 4.0000 mg | Freq: Four times a day (QID) | INTRAMUSCULAR | Status: DC | PRN

## 2024-09-06 MED ORDER — ACETAMINOPHEN 650 MG RE SUPP: 650.0000 mg | Freq: Four times a day (QID) | RECTAL | Status: DC | PRN

## 2024-09-06 MED ORDER — SODIUM CHLORIDE 0.9% FLUSH
3.0000 mL | Freq: Two times a day (BID) | INTRAVENOUS | Status: DC
  Administered 2024-09-06 – 2024-09-16 (×20): 3 mL via INTRAVENOUS

## 2024-09-06 MED ORDER — ALUM & MAG HYDROXIDE-SIMETH 200-200-20 MG/5ML PO SUSP
15.0000 mL | ORAL | Status: DC | PRN
  Administered 2024-09-07 – 2024-09-08 (×3): 15 mL via ORAL
  Filled 2024-09-06 (×3): qty 30

## 2024-09-06 MED ORDER — ENSURE PLUS HIGH PROTEIN PO LIQD: 237.0000 mL | Freq: Two times a day (BID) | ORAL | Status: DC

## 2024-09-06 MED ORDER — SODIUM CHLORIDE 0.9 % IV SOLN
2.0000 g | Freq: Two times a day (BID) | INTRAVENOUS | Status: DC
  Administered 2024-09-06 – 2024-09-09 (×6): 2 g via INTRAVENOUS
  Filled 2024-09-06 (×6): qty 12.5

## 2024-09-06 MED ORDER — FILGRASTIM-SNDZ 300 MCG/0.5ML IJ SOSY
300.0000 ug | PREFILLED_SYRINGE | Freq: Once | INTRAMUSCULAR | Status: AC
  Administered 2024-09-06: 300 ug via SUBCUTANEOUS
  Filled 2024-09-06: qty 0.5

## 2024-09-06 MED ORDER — FAMOTIDINE 20 MG PO TABS: 40.0000 mg | ORAL_TABLET | Freq: Two times a day (BID) | ORAL | Status: DC

## 2024-09-06 MED ORDER — ONDANSETRON HCL 4 MG PO TABS: 4.0000 mg | ORAL_TABLET | Freq: Four times a day (QID) | ORAL | Status: DC | PRN

## 2024-09-06 MED ORDER — VANCOMYCIN HCL 1500 MG/300ML IV SOLN
1500.0000 mg | Freq: Once | INTRAVENOUS | Status: DC
  Filled 2024-09-06: qty 300

## 2024-09-06 MED ORDER — TAMSULOSIN HCL 0.4 MG PO CAPS
0.4000 mg | ORAL_CAPSULE | Freq: Every evening | ORAL | Status: DC
  Administered 2024-09-06 – 2024-09-15 (×10): 0.4 mg via ORAL
  Filled 2024-09-06 (×4): qty 1

## 2024-09-06 MED ORDER — SODIUM CHLORIDE 0.9 % IV SOLN: 2.0000 g | Freq: Once | INTRAVENOUS | Status: DC

## 2024-09-06 MED ORDER — BISACODYL 5 MG PO TBEC: 5.0000 mg | DELAYED_RELEASE_TABLET | Freq: Every day | ORAL | Status: DC | PRN

## 2024-09-06 MED ORDER — ACETAMINOPHEN 325 MG PO TABS
650.0000 mg | ORAL_TABLET | Freq: Four times a day (QID) | ORAL | Status: DC | PRN
  Administered 2024-09-07 (×2): 650 mg via ORAL
  Filled 2024-09-06 (×3): qty 2

## 2024-09-06 MED ORDER — IOHEXOL 350 MG/ML SOLN
75.0000 mL | Freq: Once | INTRAVENOUS | Status: AC | PRN
  Administered 2024-09-06: 75 mL via INTRAVENOUS

## 2024-09-06 MED ORDER — PANTOPRAZOLE SODIUM 40 MG IV SOLR: 40.0000 mg | Freq: Every day | INTRAVENOUS | Status: DC

## 2024-09-06 MED ORDER — FAMOTIDINE 20 MG PO TABS
20.0000 mg | ORAL_TABLET | Freq: Two times a day (BID) | ORAL | Status: DC
  Administered 2024-09-06: 20 mg via ORAL
  Filled 2024-09-06: qty 1

## 2024-09-06 NOTE — Progress Notes (Signed)
 Patient here for Zarxio  injection. Upon getting the patient, he appeared sickly. His wife reported that he has not eaten much over the past week, has swollen feet, and vomited. Patient reports that he has had indigestion since last night that has not been relieved by home interventions. Patient also appeared shaky. VSS obtained, along with blood glucose. BG 140. Diane RN notified, calling ED to get a room. Consulting Civil Engineer also notified along with [facility] PA. Team at chairside in flush. EKG obtained. Rapid response activated. Patient transferred to stretcher, transported to ED. Accompanied by RN, PA, NT, and spouse.

## 2024-09-06 NOTE — ED Triage Notes (Signed)
 Patient sent from cancer center. Was getting his 2nd round of 3 of growth factor. Has stage 3 lung cancer. Has had indigestion since last night and middle chest pain. No radiation. Vomited today. Heart rate at the cancer center was in the 40s. Feels short of breath.

## 2024-09-06 NOTE — Progress Notes (Signed)
 ED Pharmacy Antibiotic Sign Off An antibiotic consult was received from an ED provider for cefepime  and vancomycin  per pharmacy dosing for febrile neutropenia and possible pneumonia. A chart review was completed to assess appropriateness.   The following one time order(s) were placed:  Cefepime  2 g IV Vancomycin  1500 mg IV  Further antibiotic and/or antibiotic pharmacy consults should be ordered by the admitting provider if indicated.   Thank you for allowing pharmacy to be a part of this patient's care.   Eleanor Agent, [facility]  Clinical Pharmacist 09/06/2024 3:15 PM

## 2024-09-06 NOTE — H&P (Addendum)
 " History and Physical    Patient: Edward [name] FMW:[id number] DOB: 04-17-1941 DOA: 09/06/2024 DOS: the patient was seen and examined on 09/06/2024 PCP: [name], NP-C  Patient coming from: oncology clinic  Chief Complaint:  Chief Complaint  Patient presents with   Chest Pain   HPI: [name] is a 83 y.o. 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 The non small cell lung cancer was diagnosed last month.  His first chemo was 9 days ago. Radiation started 3 weeks ago. He was hospitalized with right lower lobe postobstructive pneumonia 22 days ago.  He is currently taking 4 weeks of Augmentin . Today the patient had a scheduled oncology appointment for a Zarxio  injection #2/3.  He presented complaining of burning midsternal area chest pain which felt like indigestion.  He had that pain all night long.  He appeared ill and vomited in clinic.  He was short of breath and had swollen ankles which were new so the patient was sent to the ED from clinic. The patient reports that he has felt pretty terrible for the last 3 weeks.  He has had 0 energy and very little appetite.  In the last 24 hours everything got much worse.  Will he did go out to a church function yesterday evening he was having shivering per the son's report.  The wife reports he has not eaten more than 1 or 2 bites in 2 to 3 days.  The patient says he has been having a little diarrhea. Last night approximately 5:00 PM he started having this indigestion type pain.  He had been belching dramatically all day long.  He tried Pepcid  and Alka-Seltzer and Pepto-Bismol at home but none of them kept the pain away.  He sat up all night with the discomfort in his chest.  His wife also reports that his feet were swollen over the last couple days. He did have IV fluids in clinic a couple of  days ago.  In the ED the patient did have some shortness of breath.  He was given IV Protonix  for the burning chest discomfort.  He was also placed on oxygen as his O2 sats were in the low 90s.  His workup included a chest x-ray and a CTA of his chest which revealed the right infahilar mass, unchanged right pleural effusion and right middle lobe consolidation.  He does have slightly decreased right lower lobe airspace disease all compared to his last CT 22 days ago.  That was when he was diagnosed with a postobstructive pneumonia. The patient was also found to be severely neutropenic and pancytopenic.  His white blood cell count is 0.1.  Platelet count 46.  Hemoglobin 12. He was also found to have a troponin of 33 and then 36.  His previous troponin was 21.  His BNP was 2376.  Earlier this month his BNP was 1200. The patient will be admitted to the hospitalist service for severe neutropenia plus pneumonia.  He does not have of fever but he had severe shaking chills so he will be treated with IV antibiotics.  Oncology was called by the ED provider.  They agree with IV antibiotics and admission and they will follow during the hospital stay.   Review of Systems: As mentioned in the history of present illness. All other systems reviewed and are negative. Past Medical History:  Diagnosis Date  BPH (benign prostatic hyperplasia)    Cancer (HCC)    Cataract    surgical removal bilateral   CKD (chronic kidney disease) stage 3, GFR 30-59 ml/min (HCC)    DM (diabetes mellitus), type 2 (HCC)    ED (erectile dysfunction)    GERD (gastroesophageal reflux disease)    History of adenomatous polyp of colon 11/02/2017   Tubular adenoma 2013; recommended 5 year follow up   History of kidney stones    Hyperlipidemia    Hypertension    IBS (irritable bowel syndrome)    Kidney stones 02/11/2020   LBBB (left bundle branch block)    Lung nodule    Personal history of kidney stones    Vitamin D  deficiency     Past Surgical History:  Procedure Laterality Date   CATARACT EXTRACTION Right 2015   CATARACT EXTRACTION W/ INTRAOCULAR LENS IMPLANT  2013   left   COLONOSCOPY  2013   TA   ENDOBRONCHIAL ULTRASOUND Bilateral 07/30/2024   Procedure: ENDOBRONCHIAL ULTRASOUND (EBUS);  Surgeon: Isadora Hose, MD;  Location: [facility] ORS;  Service: Pulmonary;  Laterality: Bilateral;   EXTRACORPOREAL SHOCK WAVE LITHOTRIPSY Left 12/11/2017   Procedure: LEFT EXTRACORPOREAL SHOCK WAVE LITHOTRIPSY (ESWL);  Surgeon: Carolee Sherwood JONETTA DOUGLAS, MD;  Location: [facility];  Service: Urology;  Laterality: Left;   KIDNEY STONE SURGERY     POLYPECTOMY     VIDEO BRONCHOSCOPY WITH ENDOBRONCHIAL ULTRASOUND Bilateral 07/18/2024   Procedure: BRONCHOSCOPY, WITH EBUS;  Surgeon: Catherine Cools, MD;  Location: [facility];  Service: Pulmonary;  Laterality: Bilateral;   Social History:  reports that he quit smoking about 57 years ago. His smoking use included cigarettes. He started smoking about 67 years ago. He has a 5 pack-year smoking history. He has been exposed to tobacco smoke. He has never used smokeless tobacco. He reports that he does not drink alcohol  and does not use drugs.  Allergies[1]  Family History  Problem Relation Age of Onset   Diabetes Sister    Hypertension Sister    Heart disease Mother    Diabetes Sister    Hypertension Sister    Colon cancer Neg Hx    Colon polyps Neg Hx    Esophageal cancer Neg Hx    Rectal cancer Neg Hx    Stomach cancer Neg Hx     Prior to Admission medications  Medication Sig Start Date End Date Taking? Authorizing Provider  albuterol  (ACCUNEB ) 1.25 MG/3ML nebulizer solution Inhale 3 mL (1.25 mg total) by nebulization every 6 (six) hours as needed for wheezing. Patient not taking: Reported on 08/15/2024 08/14/24   Catherine Cools, MD  albuterol  (VENTOLIN  HFA) 108 (90 Base) MCG/ACT inhaler Inhale 2 puffs into the lungs every 4 (four) hours as needed for wheezing or shortness of breath.  08/19/24   Jadine Toribio SQUIBB, MD  amoxicillin -clavulanate (AUGMENTIN ) 875-125 MG tablet Take 1 tablet by mouth every 12 (twelve) hours for 28 days. 08/19/24 09/16/24  Jadine Toribio SQUIBB, MD  APPLE CIDER VINEGAR PO Take 1-2 tablets by mouth daily.    [provider]  ascorbic acid (VITAMIN C) 500 MG tablet Take 500-1,000 mg by mouth daily.    [provider]  aspirin  81 MG tablet Take 81 mg by mouth 3 (three) times a week.    [provider]  atorvastatin  (LIPITOR) 40 MG tablet Take 1 tablet (40 mg total) by mouth every evening for cholesterol 05/30/24     Cholecalciferol (VITAMIN D3) 125 MCG (5000 UT) CAPS Take  5,000 Units by mouth daily.    [provider]  CINNAMON PO Take 1-2 capsules by mouth daily.    [provider]  Cyanocobalamin (VITAMIN B 12 PO) Take 1 tablet by mouth daily.    [provider]  famotidine  (PEPCID ) 40 MG tablet Take 1 tablet (40 mg total) by mouth every evening to prevent heartburn and indigestion. Patient taking differently: Take 40 mg by mouth See admin instructions. Take 40 mg by mouth in the evening as needed for reflux or indigestion 06/27/24   Alvia Corean CROME, FNP  fluticasone  (FLONASE ) 50 MCG/ACT nasal spray Place 1-2 sprays into both nostrils daily. Patient taking differently: Place 1-2 sprays into both nostrils daily as needed for allergies or rhinitis. 06/27/24   Alvia Corean CROME, FNP  glucose blood (ONETOUCH VERIO) test strip USE TO TEST BLOOD SUGAR ONCE DAILY 06/27/24   Alvia Corean L, FNP  glucose blood test strip Use 1 strip to check glucose once daily. 06/27/24   Alvia Corean CROME, FNP  lidocaine -prilocaine  (EMLA ) cream Apply to affected area once 08/22/24   Sherrod Sherrod, MD  losartan  (COZAAR ) 50 MG tablet Take 1 tablet (50 mg total) by mouth daily for blood pressure. 08/07/24   Henson, Vickie L, NP-C  metFORMIN  (GLUCOPHAGE ) 500 MG tablet Take 1 tablet (500 mg total) by mouth 3  (three) times daily with meals for diabetes. 06/27/24   Alvia Corean CROME, FNP  metoprolol  tartrate (LOPRESSOR ) 25 MG tablet Take 0.5 tablets (12.5 mg total) by mouth 2 (two) times daily. 08/19/24   Jadine Toribio SQUIBB, MD  OMEGA-3 FATTY ACIDS PO Take 1 capsule by mouth 3 (three) times a week.    [provider]  ondansetron  (ZOFRAN ) 8 MG tablet Take 1 tablet (8 mg total) by mouth every 8 (eight) hours as needed for nausea or vomiting. Start on third day after chemotherapy. 09/04/24   Heilingoetter, Cassandra L, PA-C  pantoprazole  (PROTONIX ) 40 MG tablet Take 1 tablet (40 mg total) by mouth every evening to prevent heartburn and indigestion. Patient taking differently: Take 40 mg by mouth See admin instructions. Take 40 mg by mouth 30 minutes before breakfast as needed for reflux or indigestion 07/16/24   Henson, Vickie L, NP-C  prochlorperazine  (COMPAZINE ) 10 MG tablet Take 1 tablet (10 mg total) by mouth every 6 (six) hours as needed for nausea or vomiting (Nausea or vomiting). 09/04/24   Heilingoetter, Cassandra L, PA-C  SYSTANE COMPLETE PF 0.6 % SOLN Place 1 drop into both eyes 3 (three) times daily as needed (for dryness or irritation).    [provider]  tamsulosin  (FLOMAX ) 0.4 MG CAPS capsule Take 1 capsule (0.4 mg total) by mouth every evening for prostate. 06/27/24   Alvia Corean CROME, FNP  Tiotropium Bromide-Olodaterol (STIOLTO RESPIMAT ) 2.5-2.5 MCG/ACT AERS Inhale 2 puffs into the lungs daily. Patient not taking: Reported on 08/15/2024 08/15/24   Alghanim, Fahid, MD  zinc gluconate 50 MG tablet Take 50 mg by mouth daily.    [provider]    Physical Exam: Vitals:   09/06/24 1247 09/06/24 1248 09/06/24 1500  BP:  126/65 139/86  Pulse:  92 77  Resp:  18 (!) 22  Temp:  98.2 F (36.8 C)   TempSrc:  Oral   SpO2:  92% 95%  Weight: 71 kg    Height: 5' 9 (1.753 m)     Physical Exam:  General: Frail, ill appearing, malnourished gentleman HEENT:  Normocephalic, atraumatic, PERRL Cardiovascular: Normal rate and rhythm.  Distal pulses intact.  He does have elevated jugular venous pressures Pulmonary: Normal pulmonary effort, diminished breath sounds in the right base in contrast to good air movement in the right upper lung fields Gastrointestinal: Nondistended abdomen, soft, non-tender, normoactive bowel sounds Musculoskeletal:Normal ROM, 2+ lower ext edema Lymphadenopathy: No cervical LAD. Skin: Skin is warm and dry. Neuro: No focal deficits noted, AAOx3. PSYCH: Attentive and cooperative  Data Reviewed:  Results for orders placed or performed during the hospital encounter of 09/06/24 (from the past 24 hours)  Pro Brain natriuretic peptide     Status: Abnormal   Collection Time: 09/06/24 12:52 PM  Result Value Ref Range   Pro Brain Natriuretic Peptide 2,376.0 (H) <300.0 pg/mL  D-dimer, quantitative     Status: Abnormal   Collection Time: 09/06/24 12:52 PM  Result Value Ref Range   D-Dimer, Quant 1.91 (H) 0.00 - 0.50 ug/mL-FEU  Basic metabolic panel     Status: Abnormal   Collection Time: 09/06/24 12:53 PM  Result Value Ref Range   Sodium 134 (L) 135 - 145 mmol/L   Potassium 3.7 3.5 - 5.1 mmol/L   Chloride 98 98 - 111 mmol/L   CO2 27 22 - 32 mmol/L   Glucose, Bld 165 (H) 70 - 99 mg/dL   BUN 12 8 - 23 mg/dL   Creatinine, Ser 8.96 0.61 - 1.24 mg/dL   Calcium  8.3 (L) 8.9 - 10.3 mg/dL   GFR, Estimated >39 >39 mL/min   Anion gap 10 5 - 15  CBC     Status: Abnormal   Collection Time: 09/06/24 12:53 PM  Result Value Ref Range   WBC 0.1 (LL) 4.0 - 10.5 K/uL   RBC 4.23 4.22 - 5.81 MIL/uL   Hemoglobin 12.1 (L) 13.0 - 17.0 g/dL   HCT 62.5 (L) 60.9 - 47.9 %   MCV 88.4 80.0 - 100.0 fL   MCH 28.6 26.0 - 34.0 pg   MCHC 32.4 30.0 - 36.0 g/dL   RDW 86.3 88.4 - 84.4 %   Platelets 46 (L) 150 - 400 K/uL   nRBC 0.0 0.0 - 0.2 %  Troponin T, High Sensitivity     Status: Abnormal   Collection Time: 09/06/24 12:53 PM  Result Value Ref  Range   Troponin T High Sensitivity 33 (H) 0 - 19 ng/L  Differential     Status: Abnormal   Collection Time: 09/06/24 12:53 PM  Result Value Ref Range   Neutrophils Relative % 19 %   Neutro Abs 0.0 (LL) 1.7 - 7.7 K/uL   Lymphocytes Relative 49 %   Lymphs Abs 0.1 (L) 0.7 - 4.0 K/uL   Monocytes Relative 13 %   Monocytes Absolute 0.0 (L) 0.1 - 1.0 K/uL   Eosinophils Relative 0 %   Eosinophils Absolute 0.0 0.0 - 0.5 K/uL   Basophils Relative 6 %   Basophils Absolute 0.0 0.0 - 0.1 K/uL   WBC Morphology MORPHOLOGY UNREMARKABLE    RBC Morphology MORPHOLOGY UNREMARKABLE    Smear Review Normal platelet morphology    Immature Granulocytes 13 %   Abs Immature Granulocytes 0.02 0.00 - 0.07 K/uL  Resp panel by RT-PCR (RSV, Flu A&B, Covid) Anterior Nasal Swab     Status: None   Collection Time: 09/06/24  1:05 PM   Specimen: Anterior Nasal Swab  Result Value Ref Range   SARS Coronavirus 2 by RT PCR NEGATIVE NEGATIVE   Influenza A by PCR NEGATIVE NEGATIVE   Influenza B by PCR NEGATIVE NEGATIVE  Resp Syncytial Virus by PCR NEGATIVE NEGATIVE  Troponin T, High Sensitivity     Status: Abnormal   Collection Time: 09/06/24  2:28 PM  Result Value Ref Range   Troponin T High Sensitivity 36 (H) 0 - 19 ng/L      IMPRESSION: 1. No evidence of pulmonary embolism. 2. Lobular right infrahilar mass measuring 7.2 x 6.2 cm, similar to prior study, causing narrowing of the right middle and right lower lobe pulmonary arteries. 3. Slightly decreased airspace consolidation in the right lower lobe compared to prior study with otherwise similar size of the right pleural effusion. Similar appearance of the right middle lobe airspace consolidation.  Assessment and Plan: Severe neutropenia/ thrombocytopenia/mild anemia - likely secondary to chemotherapy - Oncology consulted - He is on Zarxio  for neutropenia.  2.  Known postobstructive pneumonia -will switch him over to IV Vanco and cefepime  at this  time.  3. DMT2 -the patient has not been eating for days.  Will hold his diabetic medication on sliding scale.  4.  Possible heart failure -the patient has an outpatient cardiology appointment scheduled in 3 days.  He does have some evidence of failure with the lower extremity edema and elevated JVD however he appears clinically dehydrated so we will hold off on diuretics at this time. He had an echo earlier this month which showed an EF of 40% with grade 1 diastolic dysfunction.  5.  Burning chest pain -this is not typical for cardiac pain.  He has been hurting for greater than 12 hours nonstop and his troponin is only 30. - He has tried multiple over-the-counter medications with no relief. May try a small dose of morphine  at this time. - IV Protonix  was given in the emergency department.      Advance Care Planning:   Code Status: Prior  The patient wants to be DNR.  He says no life support.  He became tearful when talking about it.  He names his wife as his surrogate management consultant. Consults: Oncology  Family Communication: Patient's son and wife are at bedside  Severity of Illness: The appropriate patient status for this patient is INPATIENT. Inpatient status is judged to be reasonable and necessary in order to provide the required intensity of service to ensure the patient's safety. The patient's presenting symptoms, physical exam findings, and initial radiographic and laboratory data in the context of their chronic comorbidities is felt to place them at high risk for further clinical deterioration. Furthermore, it is not anticipated that the patient will be medically stable for discharge from the hospital within 2 midnights of admission.   * I certify that at the point of admission it is my clinical judgment that the patient will require inpatient hospital care spanning beyond 2 midnights from the point of admission due to high intensity of service, high risk for further deterioration and  high frequency of surveillance required.*  Author: ARTHEA CHILD, MD 09/06/2024 3:18 PM  For on call review [email or web address].      [1]  Allergies Allergen Reactions   Bystolic [Nebivolol Hcl] Other (See Comments)    Reaction not recalled   "

## 2024-09-06 NOTE — ED Notes (Signed)
 Patients oxygen dropped to 88% room air. 2L [place] applied. Saturation improved to 95%.

## 2024-09-06 NOTE — ED Provider Notes (Signed)
 " [place] EMERGENCY DEPARTMENT AT [facility] Provider Note   CSN: [id number] Arrival date & time: 09/06/24  1241     Patient presents with: Chest Pain   Edward [name] is a 83 y.o. male.    Chest Pain    Patient has a history of hyperlipidemia, acid reflux, hypertension, irritable bowel syndrome, chronic kidney disease, lung cancer.  Patient states last evening he started having trouble with a lot of indigestion.  He feels a burning discomfort in his chest.  Patient also reports not eating much over the last couple days he vomited once.  He has also noticed some foot swelling.  Patient states been overall feeling fatigued.  He went to the cancer center today to receive a Zarxio  injection.  They recommended he go to the ED so he was transported here for further evaluation  Prior to Admission medications  Medication Sig Start Date End Date Taking? Authorizing Provider  albuterol  (ACCUNEB ) 1.25 MG/3ML nebulizer solution Inhale 3 mL (1.25 mg total) by nebulization every 6 (six) hours as needed for wheezing. Patient not taking: Reported on 08/15/2024 08/14/24   Catherine Cools, MD  albuterol  (VENTOLIN  HFA) 108 (90 Base) MCG/ACT inhaler Inhale 2 puffs into the lungs every 4 (four) hours as needed for wheezing or shortness of breath. 08/19/24   Jadine Toribio SQUIBB, MD  amoxicillin -clavulanate (AUGMENTIN ) 875-125 MG tablet Take 1 tablet by mouth every 12 (twelve) hours for 28 days. 08/19/24 09/16/24  Jadine Toribio SQUIBB, MD  APPLE CIDER VINEGAR PO Take 1-2 tablets by mouth daily.    [provider]  ascorbic acid (VITAMIN C) 500 MG tablet Take 500-1,000 mg by mouth daily.    [provider]  aspirin  81 MG tablet Take 81 mg by mouth 3 (three) times a week.    [provider]  atorvastatin  (LIPITOR) 40 MG tablet Take 1 tablet (40 mg total) by mouth every evening for cholesterol 05/30/24     Cholecalciferol (VITAMIN D3) 125 MCG (5000 UT) CAPS Take 5,000 Units  by mouth daily.    [provider]  CINNAMON PO Take 1-2 capsules by mouth daily.    [provider]  Cyanocobalamin (VITAMIN B 12 PO) Take 1 tablet by mouth daily.    [provider]  famotidine  (PEPCID ) 40 MG tablet Take 1 tablet (40 mg total) by mouth every evening to prevent heartburn and indigestion. Patient taking differently: Take 40 mg by mouth See admin instructions. Take 40 mg by mouth in the evening as needed for reflux or indigestion 06/27/24   Alvia Corean CROME, FNP  fluticasone  (FLONASE ) 50 MCG/ACT nasal spray Place 1-2 sprays into both nostrils daily. Patient taking differently: Place 1-2 sprays into both nostrils daily as needed for allergies or rhinitis. 06/27/24   Alvia Corean CROME, FNP  glucose blood (ONETOUCH VERIO) test strip USE TO TEST BLOOD SUGAR ONCE DAILY 06/27/24   Alvia Corean L, FNP  glucose blood test strip Use 1 strip to check glucose once daily. 06/27/24   Alvia Corean CROME, FNP  lidocaine -prilocaine  (EMLA ) cream Apply to affected area once 08/22/24   Mohamed, Mohamed, MD  losartan  (COZAAR ) 50 MG tablet Take 1 tablet (50 mg total) by mouth daily for blood pressure. 08/07/24   Henson, Vickie L, NP-C  metFORMIN  (GLUCOPHAGE ) 500 MG tablet Take 1 tablet (500 mg total) by mouth 3 (three) times daily with meals for diabetes. 06/27/24   Alvia Corean CROME, FNP  metoprolol  tartrate (LOPRESSOR ) 25 MG tablet Take  0.5 tablets (12.5 mg total) by mouth 2 (two) times daily. 08/19/24   Jadine Toribio SQUIBB, MD  OMEGA-3 FATTY ACIDS PO Take 1 capsule by mouth 3 (three) times a week.    [provider]  ondansetron  (ZOFRAN ) 8 MG tablet Take 1 tablet (8 mg total) by mouth every 8 (eight) hours as needed for nausea or vomiting. Start on third day after chemotherapy. 09/04/24   Heilingoetter, Cassandra L, PA-C  pantoprazole  (PROTONIX ) 40 MG tablet Take 1 tablet (40 mg total) by mouth every evening to prevent heartburn and  indigestion. Patient taking differently: Take 40 mg by mouth See admin instructions. Take 40 mg by mouth 30 minutes before breakfast as needed for reflux or indigestion 07/16/24   Henson, Vickie L, NP-C  prochlorperazine  (COMPAZINE ) 10 MG tablet Take 1 tablet (10 mg total) by mouth every 6 (six) hours as needed for nausea or vomiting (Nausea or vomiting). 09/04/24   Heilingoetter, Cassandra L, PA-C  SYSTANE COMPLETE PF 0.6 % SOLN Place 1 drop into both eyes 3 (three) times daily as needed (for dryness or irritation).    [provider]  tamsulosin  (FLOMAX ) 0.4 MG CAPS capsule Take 1 capsule (0.4 mg total) by mouth every evening for prostate. 06/27/24   Alvia Corean CROME, FNP  Tiotropium Bromide-Olodaterol (STIOLTO RESPIMAT ) 2.5-2.5 MCG/ACT AERS Inhale 2 puffs into the lungs daily. Patient not taking: Reported on 08/15/2024 08/15/24   Alghanim, Fahid, MD  zinc gluconate 50 MG tablet Take 50 mg by mouth daily.    [provider]    Allergies: Bystolic [nebivolol hcl]    Review of Systems  Cardiovascular:  Positive for chest pain.    Updated Vital Signs BP 139/86   Pulse 77   Temp 98.2 F (36.8 C) (Oral)   Resp (!) 22   Ht 1.753 m (5' 9)   Wt 71 kg   SpO2 95%   BMI 23.11 kg/m   Physical Exam Vitals and nursing note reviewed.  Constitutional:      Appearance: He is well-developed. He is ill-appearing. He is not diaphoretic.  HENT:     Head: Normocephalic and atraumatic.     Right Ear: External ear normal.     Left Ear: External ear normal.  Eyes:     General: No scleral icterus.       Right eye: No discharge.        Left eye: No discharge.     Conjunctiva/sclera: Conjunctivae normal.  Neck:     Trachea: No tracheal deviation.  Cardiovascular:     Rate and Rhythm: Normal rate. Rhythm irregular. Extrasystoles are present. Pulmonary:     Effort: Pulmonary effort is normal. No respiratory distress.     Breath sounds: Normal breath sounds. No stridor. No  wheezing or rales.  Chest:     Chest wall: No edema. There is no dullness to percussion.  Abdominal:     General: Bowel sounds are normal. There is no distension.     Palpations: Abdomen is soft.     Tenderness: There is no abdominal tenderness. There is no guarding or rebound.  Musculoskeletal:        General: No tenderness or deformity.     Cervical back: Neck supple.  Skin:    General: Skin is warm and dry.     Findings: No rash.  Neurological:     General: No focal deficit present.     Mental Status: He is alert.     Cranial Nerves:  No cranial nerve deficit, dysarthria or facial asymmetry.     Sensory: No sensory deficit.     Motor: No abnormal muscle tone or seizure activity.     Coordination: Coordination normal.  Psychiatric:        Mood and Affect: Mood normal.     (all labs ordered are listed, but only abnormal results are displayed) Labs Reviewed  BASIC METABOLIC PANEL WITH GFR - Abnormal; Notable for the following components:      Result Value   Sodium 134 (*)    Glucose, Bld 165 (*)    Calcium  8.3 (*)    All other components within normal limits  CBC - Abnormal; Notable for the following components:   WBC 0.1 (*)    Hemoglobin 12.1 (*)    HCT 37.4 (*)    Platelets 46 (*)    All other components within normal limits  PRO BRAIN NATRIURETIC PEPTIDE - Abnormal; Notable for the following components:   Pro Brain Natriuretic Peptide 2,376.0 (*)    All other components within normal limits  D-DIMER, QUANTITATIVE - Abnormal; Notable for the following components:   D-Dimer, Quant 1.91 (*)    All other components within normal limits  DIFFERENTIAL - Abnormal; Notable for the following components:   Neutro Abs 0.0 (*)    Lymphs Abs 0.1 (*)    Monocytes Absolute 0.0 (*)    All other components within normal limits  TROPONIN T, HIGH SENSITIVITY - Abnormal; Notable for the following components:   Troponin T High Sensitivity 33 (*)    All other components within normal  limits  TROPONIN T, HIGH SENSITIVITY - Abnormal; Notable for the following components:   Troponin T High Sensitivity 36 (*)    All other components within normal limits  RESP PANEL BY RT-PCR (RSV, FLU A&B, COVID)  [id number]    EKG: EKG Interpretation Date/Time:  Friday September 06 2024 13:05:52 EST Ventricular Rate:  97 PR Interval:  176 QRS Duration:  151 QT Interval:  384 QTC Calculation: 465 R Axis:   -36  Text Interpretation: Sinus rhythm Paired ventricular premature complexes IVCD, consider atypical LBBB Confirmed by Randol Simmonds [phone]) on 09/06/2024 1:25:15 PM  Radiology: CT Angio Chest PE W and/or Wo Contrast Result Date: 09/06/2024 EXAM: CTA CHEST 09/06/2024 02:14:40 PM TECHNIQUE: CTA of the chest was performed without and with the administration of 75 mL of iohexol  (OMNIPAQUE ) 350 MG/ML injection. Multiplanar reformatted images are provided for review. MIP images are provided for review. Automated exposure control, iterative reconstruction, and/or weight based adjustment of the mA/kV was utilized to reduce the radiation dose to as low as reasonably achievable. COMPARISON: 08/15/2024 CLINICAL HISTORY: Pulmonary embolism (PE) suspected, low to intermediate prob, positive D-dimer. FINDINGS: PULMONARY ARTERIES: Pulmonary arteries are adequately opacified for evaluation. No acute pulmonary embolus. Main pulmonary artery is normal in caliber. Similar narrowing of the right middle and right lower lobe pulmonary arteries due to the infrahilar mass. MEDIASTINUM: Similar cardiomegaly. LAD atherosclerosis. There is no acute abnormality of the thoracic aorta. LYMPH NODES: Unchanged left paratracheal lymph node measuring approximately 1.3 cm in short axis dimension. No hilar or axillary lymphadenopathy. LUNGS AND PLEURA: Overall, similar appearance of the lobular right infrahilar mass, measuring 7.2 x 6.2 cm. The airspace consolidation in the right lower lobe has slightly decreased in size in the  interim. The pleural effusion is similar in size to the prior exam. Right middle lobe bronchus is occluded with similar consolidation in the right middle lobe. Posterior  dependent atelectasis in the left lower lobe. No pneumothorax. UPPER ABDOMEN: No acute abnormality within the partially visualized upper abdomen. SOFT TISSUES AND BONES: Multilevel degenerative disc disease of the spine. No acute soft tissue abnormality. IMPRESSION: 1. No evidence of pulmonary embolism. 2. Lobular right infrahilar mass measuring 7.2 x 6.2 cm, similar to prior study, causing narrowing of the right middle and right lower lobe pulmonary arteries. 3. Slightly decreased airspace consolidation in the right lower lobe compared to prior study with otherwise similar size of the right pleural effusion. Similar appearance of the right middle lobe airspace consolidation. Electronically signed by: Rogelia Myers MD 09/06/2024 02:46 PM EST RP Workstation: [id number]   DG Chest Portable 1 View Result Date: 09/06/2024 CLINICAL DATA:  Chest pain EXAM: PORTABLE CHEST 1 VIEW COMPARISON:  August 14, 2024 FINDINGS: Stable cardiomegaly. Stable right basilar opacity concerning for pneumonia or atelectasis with possible small right pleural effusion. Bony thorax is unremarkable. IMPRESSION: Stable right basilar opacity as described above. Electronically Signed   By: Lynwood Landy Raddle M.D.   On: 09/06/2024 13:58     .Critical Care  Performed by: Randol Simmonds, MD Authorized by: Randol Simmonds, MD   Critical care provider statement:    Critical care time (minutes):  30   Critical care was time spent personally by me on the following activities:  Development of treatment plan with patient or surrogate, discussions with consultants, evaluation of patient's response to treatment, examination of patient, ordering and review of laboratory studies, ordering and review of radiographic studies, ordering and performing treatments and interventions, pulse  oximetry, re-evaluation of patient's condition and review of old charts    Medications Ordered in the ED  ceFEPIme  (MAXIPIME ) 2 g in sodium chloride  0.9 % 100 mL IVPB (has no administration in time range)  vancomycin  (VANCOREADY) IVPB 1500 mg/300 mL (has no administration in time range)  iohexol  (OMNIPAQUE ) 350 MG/ML injection 75 mL (75 mLs Intravenous Contrast Given 09/06/24 1408)  pantoprazole  (PROTONIX ) injection 40 mg (40 mg Intravenous Given 09/06/24 1508)    Clinical Course as of 09/06/24 1516  Fri Sep 06, 2024  1341 CBC(!!) CBC shows decreased hemoglobin of 12.1.  White count however is decreased at 0.1.  Platelets also decreased at 46 [JK]  1342 Troponin T, High Sensitivity(!) Troponin slightly increased at 3 3.  D-dimer increased at 1.91 [JK]  1443 Pro Brain natriuretic peptide(!) [JK]  1443 DG Chest Portable 1 View Chest x-ray shows stable basilar opacity [JK]  1458 Case discussed with heme onc, Dr Autumn.  WOuld cover with vanc and cefipime [JK]  1515 Case discussed with Dr Arthea regarding admission [JK]    Clinical Course User Index [JK] Randol Simmonds, MD                                 Medical Decision Making Problems Addressed: Malignant neoplasm of lung, unspecified laterality, unspecified part of lung [facility]): acute illness or injury that poses a threat to life or bodily functions Neutropenia, unspecified type: acute illness or injury that poses a threat to life or bodily functions  Amount and/or Complexity of Data Reviewed Labs: ordered. Decision-making details documented in ED Course. Radiology: ordered and independent interpretation performed. Decision-making details documented in ED Course.  Risk Prescription drug management. Decision regarding hospitalization.  Patient presented to the ED for evaluation of chest pain in the setting of lung cancer.  Patient was coming to the cancer center  today to get infusion  Patient reported burning chest pain to the  left flank.  ED workup reveals shows slightly elevated troponins that are stable no ischemic changes on EKG.  Doubt acute coronary syndrome.  Considered the possibility of pulm embolism in the setting of his lung cancer.  CT angiogram performed does not show pulmonary embolism.  Patient does have elevated BNP.  No obvious pulmonary edema on chest x-ray.  He does have some peripheral edema.  Could consider echocardiogram  Pt is neutropenic.  CXR does show persistent infiltrate.  Discussed with oncology.  WIll start on abx to cover for pna.  Discussed case with Dr Arthea regarding admission     Final diagnoses:  Neutropenia, unspecified type  Malignant neoplasm of lung, unspecified laterality, unspecified part of lung [facility])    ED Discharge Orders     None          Randol Simmonds, MD 09/06/24 1520  "

## 2024-09-07 ENCOUNTER — Inpatient Hospital Stay (HOSPITAL_COMMUNITY)

## 2024-09-07 ENCOUNTER — Inpatient Hospital Stay

## 2024-09-07 DIAGNOSIS — T451X5A Adverse effect of antineoplastic and immunosuppressive drugs, initial encounter: Secondary | ICD-10-CM

## 2024-09-07 DIAGNOSIS — J189 Pneumonia, unspecified organism: Secondary | ICD-10-CM | POA: Diagnosis not present

## 2024-09-07 DIAGNOSIS — R197 Diarrhea, unspecified: Secondary | ICD-10-CM | POA: Diagnosis not present

## 2024-09-07 DIAGNOSIS — D701 Agranulocytosis secondary to cancer chemotherapy: Secondary | ICD-10-CM

## 2024-09-07 DIAGNOSIS — A419 Sepsis, unspecified organism: Secondary | ICD-10-CM

## 2024-09-07 DIAGNOSIS — A0472 Enterocolitis due to Clostridium difficile, not specified as recurrent: Secondary | ICD-10-CM | POA: Diagnosis not present

## 2024-09-07 DIAGNOSIS — C3490 Malignant neoplasm of unspecified part of unspecified bronchus or lung: Secondary | ICD-10-CM | POA: Diagnosis not present

## 2024-09-07 LAB — MRSA NEXT GEN BY PCR, NASAL: MRSA by PCR Next Gen: NOT DETECTED

## 2024-09-07 LAB — CBC
HCT: 33.1 % — ABNORMAL LOW (ref 39.0–52.0)
Hemoglobin: 10.9 g/dL — ABNORMAL LOW (ref 13.0–17.0)
MCH: 28.7 pg (ref 26.0–34.0)
MCHC: 32.9 g/dL (ref 30.0–36.0)
MCV: 87.1 fL (ref 80.0–100.0)
Platelets: 31 K/uL — ABNORMAL LOW (ref 150–400)
RBC: 3.8 MIL/uL — ABNORMAL LOW (ref 4.22–5.81)
RDW: 13.5 % (ref 11.5–15.5)
WBC: 0.2 K/uL — CL (ref 4.0–10.5)
nRBC: 0 % (ref 0.0–0.2)

## 2024-09-07 LAB — URINALYSIS, COMPLETE (UACMP) WITH MICROSCOPIC
Bacteria, UA: NONE SEEN
Bilirubin Urine: NEGATIVE
Glucose, UA: 50 mg/dL — AB
Ketones, ur: 5 mg/dL — AB
Leukocytes,Ua: NEGATIVE
Nitrite: NEGATIVE
Protein, ur: 100 mg/dL — AB
Specific Gravity, Urine: 1.036 — ABNORMAL HIGH (ref 1.005–1.030)
pH: 5 (ref 5.0–8.0)

## 2024-09-07 LAB — GASTROINTESTINAL PANEL BY PCR, STOOL (REPLACES STOOL CULTURE)

## 2024-09-07 LAB — DIFFERENTIAL
Abs Immature Granulocytes: 0 K/uL (ref 0.00–0.07)
Basophils Absolute: 0 K/uL (ref 0.0–0.1)
Basophils Relative: 0 %
Eosinophils Absolute: 0 K/uL (ref 0.0–0.5)
Eosinophils Relative: 6 %
Immature Granulocytes: 0 %
Lymphocytes Relative: 47 %
Lymphs Abs: 0.1 K/uL — ABNORMAL LOW (ref 0.7–4.0)
Monocytes Absolute: 0.1 K/uL (ref 0.1–1.0)
Monocytes Relative: 29 %
Neutro Abs: 0 K/uL — CL (ref 1.7–7.7)
Neutrophils Relative %: 18 %

## 2024-09-07 LAB — C DIFFICILE QUICK SCREEN W PCR REFLEX
C Diff antigen: POSITIVE — AB
C Diff interpretation: DETECTED
C Diff toxin: POSITIVE — AB

## 2024-09-07 LAB — GLUCOSE, CAPILLARY
Glucose-Capillary: 134 mg/dL — ABNORMAL HIGH (ref 70–99)
Glucose-Capillary: 152 mg/dL — ABNORMAL HIGH (ref 70–99)
Glucose-Capillary: 163 mg/dL — ABNORMAL HIGH (ref 70–99)
Glucose-Capillary: 172 mg/dL — ABNORMAL HIGH (ref 70–99)
Glucose-Capillary: 179 mg/dL — ABNORMAL HIGH (ref 70–99)

## 2024-09-07 LAB — LACTIC ACID, PLASMA: Lactic Acid, Venous: 1.2 mmol/L (ref 0.5–1.9)

## 2024-09-07 LAB — BASIC METABOLIC PANEL WITH GFR
Anion gap: 9 (ref 5–15)
BUN: 13 mg/dL (ref 8–23)
CO2: 27 mmol/L (ref 22–32)
Calcium: 7.9 mg/dL — ABNORMAL LOW (ref 8.9–10.3)
Chloride: 98 mmol/L (ref 98–111)
Creatinine, Ser: 1.14 mg/dL (ref 0.61–1.24)
GFR, Estimated: >60 mL/min
Glucose, Bld: 153 mg/dL — ABNORMAL HIGH (ref 70–99)
Potassium: 3.5 mmol/L (ref 3.5–5.1)
Sodium: 134 mmol/L — ABNORMAL LOW (ref 135–145)

## 2024-09-07 LAB — MAGNESIUM: Magnesium: 1.9 mg/dL (ref 1.7–2.4)

## 2024-09-07 LAB — STREP PNEUMONIAE URINARY ANTIGEN: Strep Pneumo Urinary Antigen: NEGATIVE

## 2024-09-07 LAB — PROCALCITONIN: Procalcitonin: 0.73 ng/mL

## 2024-09-07 MED ORDER — METRONIDAZOLE 500 MG/100ML IV SOLN
500.0000 mg | Freq: Two times a day (BID) | INTRAVENOUS | Status: DC
  Administered 2024-09-07: 500 mg via INTRAVENOUS
  Filled 2024-09-07: qty 100

## 2024-09-07 MED ORDER — FILGRASTIM-AAFI 300 MCG/0.5ML IJ SOSY
300.0000 ug | PREFILLED_SYRINGE | Freq: Every day | INTRAMUSCULAR | Status: AC
  Administered 2024-09-07: 300 ug via SUBCUTANEOUS
  Filled 2024-09-07: qty 0.5

## 2024-09-07 MED ORDER — VANCOMYCIN HCL 1250 MG/250ML IV SOLN
1250.0000 mg | INTRAVENOUS | Status: DC
  Administered 2024-09-08 – 2024-09-09 (×2): 1250 mg via INTRAVENOUS
  Filled 2024-09-07 (×2): qty 250

## 2024-09-07 MED ORDER — IPRATROPIUM-ALBUTEROL 0.5-2.5 (3) MG/3ML IN SOLN
3.0000 mL | Freq: Four times a day (QID) | RESPIRATORY_TRACT | Status: DC
  Administered 2024-09-07: 3 mL via RESPIRATORY_TRACT
  Filled 2024-09-07: qty 3

## 2024-09-07 MED ORDER — MORPHINE SULFATE (PF) 2 MG/ML IV SOLN
1.0000 mg | INTRAVENOUS | Status: DC | PRN
  Administered 2024-09-08: 1 mg via INTRAVENOUS
  Filled 2024-09-07: qty 1

## 2024-09-07 MED ORDER — SODIUM CHLORIDE 0.9 % IV SOLN: INTRAVENOUS | Status: DC

## 2024-09-07 MED ORDER — SODIUM CHLORIDE 0.9% FLUSH: 10.0000 mL | INTRAVENOUS | Status: DC | PRN

## 2024-09-07 MED ORDER — IPRATROPIUM-ALBUTEROL 0.5-2.5 (3) MG/3ML IN SOLN: 3.0000 mL | Freq: Four times a day (QID) | RESPIRATORY_TRACT | Status: DC | PRN

## 2024-09-07 MED ORDER — SUCRALFATE 1 GM/10ML PO SUSP
1.0000 g | Freq: Three times a day (TID) | ORAL | Status: DC
  Administered 2024-09-07 – 2024-09-16 (×38): 1 g via ORAL
  Filled 2024-09-07 (×14): qty 10

## 2024-09-07 MED ORDER — VANCOMYCIN HCL 1500 MG/300ML IV SOLN
1500.0000 mg | Freq: Once | INTRAVENOUS | Status: AC
  Administered 2024-09-07: 1500 mg via INTRAVENOUS
  Filled 2024-09-07: qty 300

## 2024-09-07 MED ORDER — MELATONIN 5 MG PO TABS
5.0000 mg | ORAL_TABLET | Freq: Every evening | ORAL | Status: DC | PRN
  Administered 2024-09-07 – 2024-09-08 (×2): 5 mg via ORAL
  Filled 2024-09-07 (×2): qty 1

## 2024-09-07 MED ORDER — OXYCODONE HCL 5 MG PO TABS: 5.0000 mg | ORAL_TABLET | Freq: Four times a day (QID) | ORAL | Status: DC | PRN

## 2024-09-07 MED ORDER — FIDAXOMICIN 200 MG PO TABS
200.0000 mg | ORAL_TABLET | Freq: Two times a day (BID) | ORAL | Status: DC
  Administered 2024-09-07 – 2024-09-16 (×19): 200 mg via ORAL
  Filled 2024-09-07 (×8): qty 1

## 2024-09-07 MED ORDER — PANTOPRAZOLE SODIUM 40 MG IV SOLR
40.0000 mg | Freq: Two times a day (BID) | INTRAVENOUS | Status: DC
  Administered 2024-09-07: 40 mg via INTRAVENOUS
  Filled 2024-09-07: qty 10

## 2024-09-07 MED ORDER — FAMOTIDINE IN NACL 20-0.9 MG/50ML-% IV SOLN
20.0000 mg | Freq: Two times a day (BID) | INTRAVENOUS | Status: DC
  Administered 2024-09-07 – 2024-09-08 (×4): 20 mg via INTRAVENOUS
  Filled 2024-09-07 (×4): qty 50

## 2024-09-07 MED ORDER — BISACODYL 10 MG RE SUPP: 10.0000 mg | Freq: Every day | RECTAL | Status: DC | PRN

## 2024-09-07 MED ORDER — INSULIN ASPART 100 UNIT/ML IJ SOLN
0.0000 [IU] | INTRAMUSCULAR | Status: DC
  Administered 2024-09-07 – 2024-09-12 (×11): 1 [IU] via SUBCUTANEOUS
  Administered 2024-09-12: 2 [IU] via SUBCUTANEOUS
  Administered 2024-09-13: 3 [IU] via SUBCUTANEOUS
  Administered 2024-09-13: 1 [IU] via SUBCUTANEOUS
  Administered 2024-09-14: 2 [IU] via SUBCUTANEOUS
  Administered 2024-09-14: 1 [IU] via SUBCUTANEOUS
  Administered 2024-09-14 – 2024-09-15 (×2): 2 [IU] via SUBCUTANEOUS
  Administered 2024-09-16: 1 [IU] via SUBCUTANEOUS
  Filled 2024-09-07 (×3): qty 1
  Filled 2024-09-07: qty 2
  Filled 2024-09-07: qty 1

## 2024-09-07 NOTE — Progress Notes (Signed)
 Pharmacy Antibiotic Note  Edward [name] is a 83 y.o. male admitted on 09/06/2024 with febrile neutropenia.  Noted that patient is C diff positive as of this AM. Pharmacy has been consulted for vancomycin  dosing.  Plan: - Give vancomycin  1500mg  IV x1, followed by vanc 1250mg  IV q24h (eAUC 533 using Scr 1.14, IBW, Vd 0.72) - Vanc levels PRN - Monitor renal function, cultures, and overall clinical picture (can likely discontinue vanc if Bcx are NG x2 days) - De-escalate abx as able, especially given C diff positive  Height: 5' 9 (175.3 cm) Weight: 71 kg (156 lb 8.4 oz) IBW/kg (Calculated) : 70.7  Temp (24hrs), Avg:99.1 F (37.3 C), Min:98.2 F (36.8 C), Max:100 F (37.8 C)  Recent Labs  Lab 09/04/24 0858 09/06/24 1253 09/07/24 0700  WBC 0.8* 0.1* 0.2*  CREATININE 1.15 1.03 1.14    Estimated Creatinine Clearance: 50 mL/min (by C-G formula based on SCr of 1.14 mg/dL).    Allergies[1]  Antimicrobials this admission: 12/26 cefepime  >>  12/27 vancomycin  >>   Dose adjustments this admission: N/A  Microbiology results: 12/26 resp panel: neg 12/27 C diff Ag and toxin (+) 12/27 Bcx: not yet collected 12/27 GIP: ip 12/27 MRSA PCR: pending   Thank you for allowing pharmacy to be a part of this patients care.  Lacinda Moats, PharmD Clinical Pharmacist  12/27/20259:07 AM    [1]  Allergies Allergen Reactions   Bystolic [Nebivolol Hcl] Other (See Comments)    Reaction not recalled

## 2024-09-07 NOTE — Progress Notes (Signed)
 Administered MAG-AL Plus and 3 minutes later patient vomited it up. Patient had another large gelatinous stool in the Bed side Commode. Patient up in chair complaining of bad reflux and mid to upper burning sensation. MD aware and at chair side assessing.

## 2024-09-07 NOTE — Plan of Care (Signed)
" °  Problem: Activity: Goal: Risk for activity intolerance will decrease Outcome: Progressing   Problem: Elimination: Goal: Will not experience complications related to urinary retention Outcome: Progressing   Problem: Pain Managment: Goal: General experience of comfort will improve and/or be controlled Outcome: Progressing   Problem: Safety: Goal: Ability to remain free from injury will improve Outcome: Progressing   Problem: Coping: Goal: Ability to adjust to condition or change in health will improve Outcome: Progressing   Problem: Fluid Volume: Goal: Ability to maintain a balanced intake and output will improve Outcome: Progressing   Problem: Skin Integrity: Goal: Risk for impaired skin integrity will decrease Outcome: Progressing   Problem: Tissue Perfusion: Goal: Adequacy of tissue perfusion will improve Outcome: Progressing   "

## 2024-09-07 NOTE — Consult Note (Signed)
 "        [facility]    Date of Admission:  09/06/2024     Reason for Consult: sepsis, cdiff, pancytopenia    Referring Provider: Donnamarie     Lines:  Peripheral iv's  Abx: 12/27-c difficid  12/26-c vanc 12/26-c cefepime  12/26-c metronidazole          Assessment: 83 y.o. male former smoker, ckd3, dm2, bph, lbbb, hx kidney stone, recent neoadjuvant chemo for neuroendocrine tumor right lung mass admitted with lethargy and diarrhea   Patient s/p 3 weeks augmentin  and taking up to day of admission. Now with worsening diarrhea and testing confirmed cdiff  Chest ct showed improved right lung opacity but also in setting recent chemo and xrt.   Potential confounder for immediate future diagnostics include possibility of acute radiation pneumonitis. Something to considered with new respiratory sx  Patient without frank fever this admission. And so far hx sx seems to be related to cdiff and chemo/xrt side effect   I am suprprised given the soft/jello appearing stool that it was allowed to be tested for cdiff. He does have epigastric discomfort/nausea not entirely clear if cdiff or some other process that is noninfectious   Reasonable though given immunosuppressant to cover empirically for sepsis until repeat bcx returns. Given cdiff confirmation and lack of respiratory sx or other localizing infectious disease syndrome, I wouldn't prolonged post-obstructive pna tx or empiric abx beyond blood cx turn around  Solid tumor/chemo associated infection is usually bacterial process with pna/bacteremia. Pseudomonas a high risk player and so can cover for such  Hopefully in 2-3 days if gi sx all from cdiff he should start feeling better. It should be turn around time from chemo side effect as well   Plan: Agree with mrsa nares pcr screen and blood cx Seems sx is related to chemo and probable cdiff If blood cx came back negative, given his lack of respiratory  sx at this time, I would stop systemic abx on 12/29 Ok to continue vanc/cefepime  for now; dc flagyl  Agree with difficid 10 day course Maintain enteric cdiff isolation precaution Discussed with primary team      ------------------------------------------------ Principal Problem:   Neutropenic    HPI: Edward [name] is a 83 y.o. male former smoker, ckd3, dm2, bph, lbbb, hx kidney stone, recent neoadjuvant chemo for neuroendocrine tumor right lung mass admitted with lethargy and diarrhea     I reviewed oncology lung / ln tissue biopsy and progress note and imaging 08/27/24 dr Jeannett progress note -- to start on 08/27/24 every 3 week cycle carboplatin  and etoposide  for stage IIIC non-small cell well differentiated neuroendocrine tumor suspicious for atypical carcinoid malignant right middle lobe mass the ln extending to contralateral mediastinum. Adjuvant xrt also started just prior to chemo initiation   Patient recent 3 weeks amox-clav treatment for post-obstructive pna 08/19/24 started.  After chemo/xrt patient has had severe malaise, decreased appetite, and indigestion discomfort  Associated swelling in feet  Some watery diarrhea    Initial presentation this admission 09/06/24: Afebrile O2 sat low 90s on room air Pancytopenia from recent chemo Low level troponinemia Chest ct showed improved right lung opacity  Iv abx vanc/cefepime /flagyl  started Blood cx not ordered until after abx Cdiff testing showed cdiff toxin positive, started on difficid   Still not having watery diarrhea   Id called    Family History  Problem Relation Age of Onset   Diabetes Sister    Hypertension Sister  Heart disease Mother    Diabetes Sister    Hypertension Sister    Colon cancer Neg Hx    Colon polyps Neg Hx    Esophageal cancer Neg Hx    Rectal cancer Neg Hx    Stomach cancer Neg Hx     Social History[1]  Allergies[2]  Review of Systems: ROS All Other ROS  was negative, except mentioned above   Past Medical History:  Diagnosis Date   BPH (benign prostatic hyperplasia)    Cancer (HCC)    Cataract    surgical removal bilateral   CKD (chronic kidney disease) stage 3, GFR 30-59 ml/min (HCC)    DM (diabetes mellitus), type 2 (HCC)    ED (erectile dysfunction)    GERD (gastroesophageal reflux disease)    History of adenomatous polyp of colon 11/02/2017   Tubular adenoma 2013; recommended 5 year follow up   History of kidney stones    Hyperlipidemia    Hypertension    IBS (irritable bowel syndrome)    Kidney stones 02/11/2020   LBBB (left bundle branch block)    Lung nodule    Personal history of kidney stones    Vitamin D  deficiency        Scheduled Meds:  feeding supplement  237 mL Oral BID BM   fidaxomicin   200 mg Oral BID   insulin  aspart  0-2 Units Subcutaneous TID WC   pantoprazole  (PROTONIX ) IV  40 mg Intravenous Q12H   sodium chloride  flush  3 mL Intravenous Q12H   sucralfate   1 g Oral TID WC & HS   tamsulosin   0.4 mg Oral QPM   Continuous Infusions:  ceFEPime  (MAXIPIME ) IV 2 g (09/07/24 1156)   vancomycin      Followed by   NOREEN ON 09/08/2024] vancomycin      PRN Meds:.acetaminophen  **OR** acetaminophen , alum & mag hydroxide-simeth, bisacodyl , bisacodyl , ipratropium-albuterol , melatonin, morphine  injection, ondansetron  **OR** ondansetron  (ZOFRAN ) IV, oxyCODONE    OBJECTIVE: Blood pressure 129/73, pulse 96, temperature 99.1 F (37.3 C), temperature source Oral, resp. rate 18, height 5' 9 (1.753 m), weight 71 kg, SpO2 93%.  Physical Exam  General/constitutional: no distress, pleasant HEENT: Normocephalic, PER, Conj Clear, EOMI, Oropharynx clear Neck supple CV: rrr no mrg Lungs: clear to auscultation, normal respiratory effort Abd: Soft, Nontender Ext: no edema Skin: No Rash Neuro: nonfocal MSK: no peripheral joint swelling/tenderness/warmth; back spines nontender   Central line presence: no port a cath  present   Lab Results Lab Results  Component Value Date   WBC 0.2 (LL) 09/07/2024   HGB 10.9 (L) 09/07/2024   HCT 33.1 (L) 09/07/2024   MCV 87.1 09/07/2024   PLT 31 (L) 09/07/2024    Lab Results  Component Value Date   CREATININE 1.14 09/07/2024   BUN 13 09/07/2024   NA 134 (L) 09/07/2024   K 3.5 09/07/2024   CL 98 09/07/2024   CO2 27 09/07/2024    Lab Results  Component Value Date   ALT 9 09/04/2024   AST 12 (L) 09/04/2024   ALKPHOS 63 09/04/2024   BILITOT 0.5 09/04/2024      Microbiology: Recent Results (from the past 240 hours)  Resp panel by RT-PCR (RSV, Flu A&B, Covid) Anterior Nasal Swab     Status: None   Collection Time: 09/06/24  1:05 PM   Specimen: Anterior Nasal Swab  Result Value Ref Range Status   SARS Coronavirus 2 by RT PCR NEGATIVE NEGATIVE Final    Comment: (NOTE) SARS-CoV-2 target nucleic acids  are NOT DETECTED.  The SARS-CoV-2 RNA is generally detectable in upper respiratory specimens during the acute phase of infection. The lowest concentration of SARS-CoV-2 viral copies this assay can detect is 138 copies/mL. A negative result does not preclude SARS-Cov-2 infection and should not be used as the sole basis for treatment or other patient management decisions. A negative result may occur with  improper specimen collection/handling, submission of specimen other than nasopharyngeal swab, presence of viral mutation(s) within the areas targeted by this assay, and inadequate number of viral copies(<138 copies/mL). A negative result must be combined with clinical observations, patient history, and epidemiological information. The expected result is Negative.  Fact Sheet for Patients:  bloggercourse.com  Fact Sheet for Healthcare Providers:  seriousbroker.it  This test is no t yet approved or cleared by the [place]  FDA and  has been authorized for detection and/or diagnosis of SARS-CoV-2  by FDA under an Emergency Use Authorization (EUA). This EUA will remain  in effect (meaning this test can be used) for the duration of the COVID-19 declaration under Section 564(b)(1) of the Act, 21 U.S.C.section 360bbb-3(b)(1), unless the authorization is terminated  or revoked sooner.       Influenza A by PCR NEGATIVE NEGATIVE Final   Influenza B by PCR NEGATIVE NEGATIVE Final    Comment: (NOTE) The Xpert Xpress SARS-CoV-2/FLU/RSV plus assay is intended as an aid in the diagnosis of influenza from Nasopharyngeal swab specimens and should not be used as a sole basis for treatment. Nasal washings and aspirates are unacceptable for Xpert Xpress SARS-CoV-2/FLU/RSV testing.  Fact Sheet for Patients: bloggercourse.com  Fact Sheet for Healthcare Providers: seriousbroker.it  This test is not yet approved or cleared by the [place]  FDA and has been authorized for detection and/or diagnosis of SARS-CoV-2 by FDA under an Emergency Use Authorization (EUA). This EUA will remain in effect (meaning this test can be used) for the duration of the COVID-19 declaration under Section 564(b)(1) of the Act, 21 U.S.C. section 360bbb-3(b)(1), unless the authorization is terminated or revoked.     Resp Syncytial Virus by PCR NEGATIVE NEGATIVE Final    Comment: (NOTE) Fact Sheet for Patients: bloggercourse.com  Fact Sheet for Healthcare Providers: seriousbroker.it  This test is not yet approved or cleared by the [place]  FDA and has been authorized for detection and/or diagnosis of SARS-CoV-2 by FDA under an Emergency Use Authorization (EUA). This EUA will remain in effect (meaning this test can be used) for the duration of the COVID-19 declaration under Section 564(b)(1) of the Act, 21 U.S.C. section 360bbb-3(b)(1), unless the authorization is terminated or revoked.  Performed at  [facility], [address], [place], [place] [address]   C Difficile Quick Screen w PCR reflex     Status: Abnormal   Collection Time: 09/07/24  6:36 AM   Specimen: STOOL  Result Value Ref Range Status   C Diff antigen POSITIVE (A) NEGATIVE Final   C Diff toxin POSITIVE (A) NEGATIVE Final   C Diff interpretation Toxin producing C. difficile detected.  Final    Comment: CRITICAL RESULT CALLED TO, READ BACK BY AND VERIFIED WITH: ENOLA PARAS RN AT [id number] ON 09/07/2024 BY PRUDY POUR Performed at [facility], [address], [place], [place] [address]      Serology:   Study: 07/30/24 lung ebus biopsy cytology right lung mass      - WELL-DIFFERENTIATED NEUROENDOCRINE TUMOR, SEE NOTE       - WELL-DIFFERENTIATED NEUROENDOCRINE TUMOR,  SEE NOTE       - LYMPHOID TISSUE IS NOT IDENTIFIED       - RARE CLUSTERS OF WELL-DIFFERENTIATED NEUROENDOCRINE TUMOR, SEE       - NO MALIGNANT CELLS IDENTIFIED   11/6 mediastinal ln bx B. LYMPH NODE, 4L, FINE NEEDLE ASPIRATION:  - No malignant cells identified  - Lymphoid tissue present   11/6 rml lung washing A. LUNG, RML MASS, BRUSHING:  - Atypical cells present, see comment   C. LUNG, RML MASS, FINE NEEDLE ASPIRATION:  - Scant atypical cells present, see comment  - Predominantly blood   Imaging: If present, new imagings (plain films, ct scans, and mri) have been personally visualized and interpreted; radiology reports have been reviewed. Decision making incorporated into the Impression / Recommendations.  12/26 ct angio chest 1. No evidence of pulmonary embolism. 2. Lobular right infrahilar mass measuring 7.2 x 6.2 cm, similar to prior study, causing narrowing of the right middle and right lower lobe pulmonary arteries. 3. Slightly decreased airspace consolidation in the right lower lobe compared to prior study with otherwise similar size of the right pleural effusion. Similar appearance of the right middle  lobe airspace consolidation.   Constance ONEIDA Passer, MD [facility] [phone] pager    09/07/2024, 12:38 PM     [1]  Social History Tobacco Use   Smoking status: Former    Current packs/day: 0.00    Average packs/day: 0.5 packs/day for 10.0 years (5.0 ttl pk-yrs)    Types: Cigarettes    Start date: 09/12/1957    Quit date: 09/13/1967    Years since quitting: 57.0    Passive exposure: Past   Smokeless tobacco: Never  Vaping Use   Vaping status: Never Used  Substance Use Topics   Alcohol  use: No   Drug use: No  [2]  Allergies Allergen Reactions   Bystolic [Nebivolol Hcl] Other (See Comments)    Reaction not recalled   "

## 2024-09-07 NOTE — Progress Notes (Addendum)
 "  PROGRESS NOTE  Edward [name] FMW:[id number] DOB: 24-Dec-1940 DOA: 09/06/2024 PCP: [name], NP-C  HPI/Recap of past 24 hours: Edward [name] is a 83 y.o. 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  Patient's first chemotherapy dose was on December 16/2025, also undergoing radiation, with last day being on 10/03/2024.  Patient was recently seen at the cancer center on 12/24, noted to be neutropenic secondary to his chemo and received G-CSF injection. On 12/26 presented back to the cancer center for his second dose of G-CSF, due to his persistent symptoms, was sent to the ED for further management.  In the ED, vital signs fairly stable, labs show pancytopenia including severe neutropenia. CTA of his chest revealed right infahilar mass, unchanged right pleural effusion and right middle lobe consolidation, slightly decreased right lower lobe airspace disease all compared to his last CT. patient was started on IV antibiotics and admitted for further management.    Today, patient continues to have nausea/vomiting, persistent diarrhea, epigastric pain, overall with very poor prognosis.  Denies any left-sided chest pain, no fever.  Appears significantly weak/very ill looking.  Patient placed on telemetry.  ID consulted, discussed extensively.    Assessment/Plan: Principal Problem:   Neutropenic Active Problems:   Obstructive pneumonia   Sepsis (HCC)   C. difficile colitis   Malignant neoplasm of lung (HCC)   C. difficile colitis with underlying neutropenia Nausea/vomiting Currently afebrile, with  neutropenia BC x 2 pending, MRSA PCR negative Stool sample positive for C. difficile, GIP panel pending Lactic acid pending DG abdomen pending, if worsening symptoms, will need a CT abdomen for further evaluation ID on board, continue p.o. Dificid , may switch to enemas/IV if unable to tolerate Continue systemic IV antibiotics given neutropenia until cultures are negative x 48 hours, then de-escalate given C. difficile diarrhea Continue IV fluids, clear liquid diet, advance as tolerated Neutropenic and enteric precaution Telemetry, monitor closely.  Epigastric pain Reports burning in nature Possibly radiation induced esophagitis May need esophagram, ??  GI consult SLP consulted Continue famotidine  IV (hold PPI in the setting of C. Difficile), Carafate  suspension, IV morphine  as needed Continue clear liquid diet for now  Pancytopenia Likely 2/2 chemotherapy, with severe neutropenia Received filgrastim  x 2 doses in the past week prior to arrival Daily CBC, neutropenic precautions Oncology notified  Possible postobstructive pneumonia/radiation induced pneumonitis ??Aspiration pneumonia Currently afebrile, with neutropenia Currently saturating well on room air Pro-Cal pending CTA of his chest revealed right infahilar mass, unchanged right pleural effusion and right middle lobe consolidation, slightly decreased right lower lobe airspace disease all compared to his last CT Continue IV Vanco, cefepime  pending cultures/x 48 hours, de-escalate once clinically indicated given C. Difficile SLP consulted DuoNebs, supplemental O2 as needed Telemetry  Elevated proBNP Mildly elevated troponin Troponin 33-36, flat trend, denies any left-sided pain, trace pedal edema EKG with no acute ST changes, noted PVCs, old left bundle branch block Patient appears clinically dry Echo done 08/2024 showed EF of 40 to 45%, no regional wall motion abnormalities, grade 1 diastolic dysfunction Chest x-ray with  no congestion/volume overload Given recent C. Difficile, nausea/vomiting, poor oral intake, we will continue with IV hydration  Telemetry  Diabetes mellitus type 2 Last A1c SSI, Accu-Cheks, hypoglycemic protocol  Non-small cell lung cancer/neuroendocrine tumor Diagnosed in November 2025 Currently on chemoradiation, followed by Dr. Gatha, added to the treatment team First chemotherapy dose was on December 16/2025, also undergoing radiation, with last day being on 10/03/2024  Goals of care discussion Overall very poor prognosis given above Palliative/hospice consulted    Estimated body mass index is 23.11 kg/m as calculated from the following:   Height as of this encounter: 5' 9 (1.753 m).   Weight as of this encounter: 71 kg.     Code Status: DNR  Family Communication: Discussed with wife over the phone  Disposition Plan: Status is: Inpatient Remains inpatient appropriate because: Level of care      Consultants: ID  Procedures: None  Antimicrobials: Dificid  Vancomycin , cefepime   DVT prophylaxis: SCD due to thrombocytopenia   Objective: Vitals:   09/06/24 1957 09/07/24 0049 09/07/24 0633 09/07/24 1000  BP: 139/67 (!) 108/56 129/73   Pulse: 98 87 96   Resp: 20 20 18    Temp: 99.6 F (37.6 C) 100 F (37.8 C) 99.1 F (37.3 C)   TempSrc: Oral Oral Oral   SpO2: 91% 93% 94% 93%  Weight:      Height:        Intake/Output Summary (Last 24 hours) at 09/07/2024 1452 Last data filed at 09/07/2024 1100 Gross per 24 hour  Intake 237 ml  Output 900 ml  Net -663 ml   Filed Weights   09/06/24 1247  Weight: 71 kg    Exam: General: Acutely ill-appearing, very deconditioned Cardiovascular: S1, S2 present Respiratory: Diminished breath sounds bilaterally Abdomen: Soft, epigastric tenderness, nondistended, bowel sounds present Musculoskeletal: Trace bilateral pedal edema noted Skin: Normal Psychiatry: Fair mood     Data Reviewed: CBC: Recent Labs   Lab 09/04/24 0858 09/06/24 1253 09/07/24 0700  WBC 0.8* 0.1* 0.2*  NEUTROABS 0.6* 0.0* 0.0*  HGB 12.2* 12.1* 10.9*  HCT 37.0* 37.4* 33.1*  MCV 86.7 88.4 87.1  PLT 111* 46* 31*   Basic Metabolic Panel: Recent Labs  Lab 09/04/24 0858 09/06/24 1253 09/07/24 0700  NA 140 134* 134*  K 3.7 3.7 3.5  CL 105 98 98  CO2 27 27 27   GLUCOSE 146* 165* 153*  BUN 14 12 13   CREATININE 1.15 1.03 1.14  CALCIUM  8.1* 8.3* 7.9*  MG  --   --  1.9   GFR: Estimated Creatinine Clearance: 50 mL/min (by C-G formula based on SCr of 1.14 mg/dL). Liver Function Tests: Recent Labs  Lab 09/04/24 0858  AST 12*  ALT 9  ALKPHOS 63  BILITOT 0.5  PROT 5.7*  ALBUMIN 3.3*   No results for input(s): LIPASE, AMYLASE in the last 168 hours. No results for input(s): AMMONIA in the last 168 hours. Coagulation Profile: No results for input(s): INR, PROTIME in the last 168 hours. Cardiac Enzymes: No results for input(s): CKTOTAL, CKMB, CKMBINDEX, TROPONINI in the last 168 hours. BNP (last 3 results) Recent Labs    08/15/24 1242 09/06/24 1252  PROBNP 1,200.0* 2,376.0*   HbA1C: No results for input(s): HGBA1C in the last 72 hours. CBG: Recent Labs  Lab 09/06/24 1750 09/06/24 2136 09/07/24 0801 09/07/24 1226  GLUCAP 147* 149* 134* 172*   Lipid Profile: No results for input(s): CHOL, HDL, LDLCALC, TRIG, CHOLHDL, LDLDIRECT in the last 72 hours. Thyroid  Function Tests: No results for input(s): TSH, T4TOTAL, FREET4, T3FREE, THYROIDAB in the last 72 hours. Anemia Panel: No results for  input(s): VITAMINB12, FOLATE, FERRITIN, TIBC, IRON, RETICCTPCT in the last 72 hours. Urine analysis:    Component Value Date/Time   COLORURINE YELLOW 08/15/2024 1325   APPEARANCEUR CLEAR 08/15/2024 1325   LABSPEC 1.011 08/15/2024 1325   PHURINE 5.0 08/15/2024 1325   GLUCOSEU NEGATIVE 08/15/2024 1325   HGBUR SMALL (A) 08/15/2024 1325   BILIRUBINUR NEGATIVE  08/15/2024 1325   KETONESUR NEGATIVE 08/15/2024 1325   PROTEINUR NEGATIVE 08/15/2024 1325   NITRITE NEGATIVE 08/15/2024 1325   LEUKOCYTESUR NEGATIVE 08/15/2024 1325   Sepsis Labs: @LABRCNTIP (procalcitonin:4,lacticidven:4)  ) Recent Results (from the past 240 hours)  Resp panel by RT-PCR (RSV, Flu A&B, Covid) Anterior Nasal Swab     Status: None   Collection Time: 09/06/24  1:05 PM   Specimen: Anterior Nasal Swab  Result Value Ref Range Status   SARS Coronavirus 2 by RT PCR NEGATIVE NEGATIVE Final    Comment: (NOTE) SARS-CoV-2 target nucleic acids are NOT DETECTED.  The SARS-CoV-2 RNA is generally detectable in upper respiratory specimens during the acute phase of infection. The lowest concentration of SARS-CoV-2 viral copies this assay can detect is 138 copies/mL. A negative result does not preclude SARS-Cov-2 infection and should not be used as the sole basis for treatment or other patient management decisions. A negative result may occur with  improper specimen collection/handling, submission of specimen other than nasopharyngeal swab, presence of viral mutation(s) within the areas targeted by this assay, and inadequate number of viral copies(<138 copies/mL). A negative result must be combined with clinical observations, patient history, and epidemiological information. The expected result is Negative.  Fact Sheet for Patients:  bloggercourse.com  Fact Sheet for Healthcare Providers:  seriousbroker.it  This test is no t yet approved or cleared by the [place]  FDA and  has been authorized for detection and/or diagnosis of SARS-CoV-2 by FDA under an Emergency Use Authorization (EUA). This EUA will remain  in effect (meaning this test can be used) for the duration of the COVID-19 declaration under Section 564(b)(1) of the Act, 21 U.S.C.section 360bbb-3(b)(1), unless the authorization is terminated  or revoked sooner.        Influenza A by PCR NEGATIVE NEGATIVE Final   Influenza B by PCR NEGATIVE NEGATIVE Final    Comment: (NOTE) The Xpert Xpress SARS-CoV-2/FLU/RSV plus assay is intended as an aid in the diagnosis of influenza from Nasopharyngeal swab specimens and should not be used as a sole basis for treatment. Nasal washings and aspirates are unacceptable for Xpert Xpress SARS-CoV-2/FLU/RSV testing.  Fact Sheet for Patients: bloggercourse.com  Fact Sheet for Healthcare Providers: seriousbroker.it  This test is not yet approved or cleared by the [place]  FDA and has been authorized for detection and/or diagnosis of SARS-CoV-2 by FDA under an Emergency Use Authorization (EUA). This EUA will remain in effect (meaning this test can be used) for the duration of the COVID-19 declaration under Section 564(b)(1) of the Act, 21 U.S.C. section 360bbb-3(b)(1), unless the authorization is terminated or revoked.     Resp Syncytial Virus by PCR NEGATIVE NEGATIVE Final    Comment: (NOTE) Fact Sheet for Patients: bloggercourse.com  Fact Sheet for Healthcare Providers: seriousbroker.it  This test is not yet approved or cleared by the [place]  FDA and has been authorized for detection and/or diagnosis of SARS-CoV-2 by FDA under an Emergency Use Authorization (EUA). This EUA will remain in effect (meaning this test can be used) for the duration of the COVID-19 declaration under Section 564(b)(1) of the Act, 21 U.S.C. section 360bbb-3(b)(1),  unless the authorization is terminated or revoked.  Performed at [facility], [address], [place], [place] [address]   C Difficile Quick Screen w PCR reflex     Status: Abnormal   Collection Time: 09/07/24  6:36 AM   Specimen: STOOL  Result Value Ref Range Status   C Diff antigen POSITIVE (A) NEGATIVE Final   C Diff toxin  POSITIVE (A) NEGATIVE Final   C Diff interpretation Toxin producing C. difficile detected.  Final    Comment: CRITICAL RESULT CALLED TO, READ BACK BY AND VERIFIED WITH: ENOLA PARAS RN AT 9268 ON 09/07/2024 BY PRUDY POUR Performed at [facility], [address], [place], [place] [address]   MRSA Next Gen by PCR, Nasal     Status: None   Collection Time: 09/07/24 11:29 AM   Specimen: Nasal Mucosa; Nasal Swab  Result Value Ref Range Status   MRSA by PCR Next Gen NOT DETECTED NOT DETECTED Final    Comment: (NOTE) The GeneXpert MRSA Assay (FDA approved for NASAL specimens only), is one component of a comprehensive MRSA colonization surveillance program. It is not intended to diagnose MRSA infection nor to guide or monitor treatment for MRSA infections. Test performance is not FDA approved in patients less than 41 years old. Performed at [facility], [address] [place], [place] [address]       Studies: No results found.  Scheduled Meds:  feeding supplement  237 mL Oral BID BM   fidaxomicin   200 mg Oral BID   insulin  aspart  0-6 Units Subcutaneous Q4H   sodium chloride  flush  3 mL Intravenous Q12H   sucralfate   1 g Oral TID WC & HS   tamsulosin   0.4 mg Oral QPM    Continuous Infusions:  sodium chloride      ceFEPime  (MAXIPIME ) IV 2 g (09/07/24 1156)   famotidine  (PEPCID ) IV     vancomycin  1,500 mg (09/07/24 1333)   Followed by   NOREEN ON 09/08/2024] vancomycin        LOS: 1 day     Lebron PARAS Cage, MD [facility]  If 7PM-7AM, please contact night-coverage [email or web address] 09/07/2024, 2:52 PM    "

## 2024-09-08 ENCOUNTER — Ambulatory Visit

## 2024-09-08 DIAGNOSIS — R4589 Other symptoms and signs involving emotional state: Secondary | ICD-10-CM

## 2024-09-08 DIAGNOSIS — Z711 Person with feared health complaint in whom no diagnosis is made: Secondary | ICD-10-CM

## 2024-09-08 DIAGNOSIS — R627 Adult failure to thrive: Secondary | ICD-10-CM

## 2024-09-08 DIAGNOSIS — Z515 Encounter for palliative care: Secondary | ICD-10-CM

## 2024-09-08 DIAGNOSIS — A0472 Enterocolitis due to Clostridium difficile, not specified as recurrent: Secondary | ICD-10-CM | POA: Diagnosis not present

## 2024-09-08 DIAGNOSIS — R531 Weakness: Secondary | ICD-10-CM | POA: Diagnosis not present

## 2024-09-08 DIAGNOSIS — Z66 Do not resuscitate: Secondary | ICD-10-CM | POA: Diagnosis not present

## 2024-09-08 DIAGNOSIS — K219 Gastro-esophageal reflux disease without esophagitis: Secondary | ICD-10-CM | POA: Diagnosis not present

## 2024-09-08 DIAGNOSIS — Z7189 Other specified counseling: Secondary | ICD-10-CM | POA: Diagnosis not present

## 2024-09-08 DIAGNOSIS — D701 Agranulocytosis secondary to cancer chemotherapy: Secondary | ICD-10-CM | POA: Diagnosis not present

## 2024-09-08 DIAGNOSIS — T451X5A Adverse effect of antineoplastic and immunosuppressive drugs, initial encounter: Secondary | ICD-10-CM | POA: Diagnosis not present

## 2024-09-08 LAB — GLUCOSE, CAPILLARY
Glucose-Capillary: 145 mg/dL — ABNORMAL HIGH (ref 70–99)
Glucose-Capillary: 145 mg/dL — ABNORMAL HIGH (ref 70–99)
Glucose-Capillary: 160 mg/dL — ABNORMAL HIGH (ref 70–99)
Glucose-Capillary: 117 mg/dL — ABNORMAL HIGH (ref 70–99)
Glucose-Capillary: 115 mg/dL — ABNORMAL HIGH (ref 70–99)

## 2024-09-08 LAB — CBC
HCT: 30.9 % — ABNORMAL LOW (ref 39.0–52.0)
Hemoglobin: 10.1 g/dL — ABNORMAL LOW (ref 13.0–17.0)
MCH: 28.5 pg (ref 26.0–34.0)
MCHC: 32.7 g/dL (ref 30.0–36.0)
MCV: 87.3 fL (ref 80.0–100.0)
Platelets: 19 K/uL — CL (ref 150–400)
RBC: 3.54 MIL/uL — ABNORMAL LOW (ref 4.22–5.81)
RDW: 13.4 % (ref 11.5–15.5)
WBC: 0.4 K/uL — CL (ref 4.0–10.5)
nRBC: 0 % (ref 0.0–0.2)

## 2024-09-08 LAB — BASIC METABOLIC PANEL WITH GFR
Anion gap: 10 (ref 5–15)
Anion gap: 10 (ref 5–15)
BUN: 18 mg/dL (ref 8–23)
BUN: 17 mg/dL (ref 8–23)
CO2: 25 mmol/L (ref 22–32)
CO2: 22 mmol/L (ref 22–32)
Calcium: 7.7 mg/dL — ABNORMAL LOW (ref 8.9–10.3)
Calcium: 7.6 mg/dL — ABNORMAL LOW (ref 8.9–10.3)
Chloride: 101 mmol/L (ref 98–111)
Chloride: 104 mmol/L (ref 98–111)
Creatinine, Ser: 1.12 mg/dL (ref 0.61–1.24)
Creatinine, Ser: 0.84 mg/dL (ref 0.61–1.24)
GFR, Estimated: >60 mL/min
GFR, Estimated: >60 mL/min
Glucose, Bld: 147 mg/dL — ABNORMAL HIGH (ref 70–99)
Glucose, Bld: 101 mg/dL — ABNORMAL HIGH (ref 70–99)
Potassium: 3 mmol/L — ABNORMAL LOW (ref 3.5–5.1)
Potassium: 2.9 mmol/L — ABNORMAL LOW (ref 3.5–5.1)
Sodium: 136 mmol/L (ref 135–145)
Sodium: 135 mmol/L (ref 135–145)

## 2024-09-08 LAB — MAGNESIUM
Magnesium: 1.9 mg/dL (ref 1.7–2.4)
Magnesium: 1.9 mg/dL (ref 1.7–2.4)

## 2024-09-08 MED ORDER — LIDOCAINE-PRILOCAINE 2.5-2.5 % EX CREA
TOPICAL_CREAM | Freq: Once | CUTANEOUS | Status: AC
  Filled 2024-09-08: qty 5

## 2024-09-08 MED ORDER — POTASSIUM CHLORIDE 10 MEQ/100ML IV SOLN
10.0000 meq | INTRAVENOUS | Status: AC
  Administered 2024-09-08 (×4): 10 meq via INTRAVENOUS
  Filled 2024-09-08 (×4): qty 100

## 2024-09-08 MED ORDER — PHENYLEPHRINE-MINERAL OIL-PET 0.25-14-74.9 % RE OINT
1.0000 | TOPICAL_OINTMENT | Freq: Two times a day (BID) | RECTAL | Status: AC | PRN
  Administered 2024-09-08: 1 via RECTAL
  Filled 2024-09-08: qty 57

## 2024-09-08 MED ORDER — PANTOPRAZOLE SODIUM 40 MG PO TBEC
40.0000 mg | DELAYED_RELEASE_TABLET | Freq: Every day | ORAL | Status: DC
  Administered 2024-09-08 – 2024-09-10 (×3): 40 mg via ORAL
  Filled 2024-09-08 (×2): qty 1

## 2024-09-08 MED ORDER — POTASSIUM CHLORIDE 10 MEQ/100ML IV SOLN
10.0000 meq | INTRAVENOUS | Status: AC
  Administered 2024-09-09 (×6): 10 meq via INTRAVENOUS
  Filled 2024-09-08 (×6): qty 100

## 2024-09-08 NOTE — Progress Notes (Signed)
 SLP Cancellation Note  Patient Details Name: VINAL ROSENGRANT MRN: [id number] DOB: Oct 18, 1940   Cancelled treatment:       Reason Eval/Treat Not Completed: Patient at procedure or test/unavailable. Pt with staff in room. Will continue efforts.  Hunner Garcon B. Dory, MSP, CCC-SLP Speech Language Pathologist Office: [phone]  Dory Caprice Daring 09/08/2024, 9:40 AM

## 2024-09-08 NOTE — Plan of Care (Signed)

## 2024-09-08 NOTE — Progress Notes (Addendum)
 "  PROGRESS NOTE  Edward [name] FMW:[id number] DOB: August 19, 1941 DOA: 09/06/2024 PCP: Edward Boby CROME, NP-C  HPI/Recap of past 24 hours: Edward [name] is a 83 y.o. 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  Patient's first chemotherapy dose was on December 16/2025, also undergoing radiation, with last day being on 10/03/2024.  Patient was recently seen at the cancer center on 12/24, noted to be neutropenic secondary to his chemo and received G-CSF injection. On 12/26 presented back to the cancer center for his second dose of G-CSF, due to his persistent symptoms, was sent to the ED for further management.  In the ED, vital signs fairly stable, labs show pancytopenia including severe neutropenia. CTA of his chest revealed right infahilar mass, unchanged right pleural effusion and right middle lobe consolidation, slightly decreased right lower lobe airspace disease all compared to his last CT. patient was started on IV antibiotics and admitted for further management.     Today, patient denies any new complaints, continues to report epigastric discomfort, noted possible esophageal dysphagia.  Still with diarrhea    Assessment/Plan: Principal Problem:   Neutropenic Active Problems:   Obstructive pneumonia   Sepsis (HCC)   C. difficile colitis   Malignant neoplasm of lung (HCC)   C. difficile colitis with underlying neutropenia Nausea/vomiting Currently afebrile, with neutropenia BC x 2 pending, MRSA PCR negative Stool sample positive for C. difficile, GIP panel negative Lactic acid WNL DG abdomen  no SBO, or megacolon ID on board, continue p.o. Dificid  Continue systemic IV antibiotics given neutropenia until cultures are negative x 48 hours, then de-escalate given C. difficile diarrhea Continue IV fluids, clear liquid diet, advance as tolerated Neutropenic and enteric precaution Telemetry, monitor closely.  Epigastric pain Possible esophageal dysphagia/radiation-induced Reports burning in nature Esophagram pending SLP consulted, appreciate recs Continue famotidine  IV (hold PPI in the setting of C. Difficile), Carafate  suspension, IV morphine  as needed Continue clear liquid diet for now  Pancytopenia Likely 2/2 chemotherapy, with severe neutropenia Received filgrastim  x 2 doses in the past week prior to arrival, received third dose on 12/27 Daily CBC, neutropenic precautions Oncology notified  Hypokalemia Replace as needed  Possible postobstructive pneumonia/radiation induced pneumonitis ??Aspiration pneumonia Currently afebrile, with neutropenia Currently saturating well on room air Pro-Cal 0.73 CTA of his chest revealed right infahilar mass, unchanged right pleural effusion and right middle lobe consolidation, slightly decreased right lower lobe airspace disease all compared to his last CT Continue IV Vanco, cefepime  pending cultures/x 48 hours, de-escalate once clinically indicated given C. Difficile SLP consulted DuoNebs, supplemental O2 as needed Telemetry  Elevated proBNP Mildly elevated troponin Troponin 33-36, flat trend, denies any left-sided pain, trace pedal edema EKG with no acute ST changes, noted PVCs, old left bundle branch block Patient appears clinically dry Echo done 08/2024 showed EF of 40 to 45%, no regional wall motion abnormalities, grade 1 diastolic dysfunction Chest x-ray with no congestion/volume overload Given recent C. Difficile, nausea/vomiting, poor oral intake, continue with IV hydration Telemetry  Diabetes mellitus type 2 Last A1c 7  on 06/21/2024 SSI, Accu-Cheks, hypoglycemic protocol  Non-small cell lung cancer/neuroendocrine tumor Diagnosed in November 2025 Currently on chemoradiation, followed by Dr.  Mohammed, added to the treatment team First chemotherapy dose was on December 16/2025, also undergoing radiation, with last day being on 10/03/2024  Goals of care discussion Overall very poor prognosis given above Palliative/hospice consulted, appreciate recs    Estimated body mass index is 23.11 kg/m as calculated from the following:   Height as of this encounter: 5' 9 (1.753 m).   Weight as of this encounter: 71 kg.     Code Status: DNR  Family Communication: Discussed with wife over the phone  Disposition Plan: Status is: Inpatient Remains inpatient appropriate because: Level of care      Consultants: ID  Procedures: None  Antimicrobials: Dificid  Vancomycin , cefepime   DVT prophylaxis: SCD due to thrombocytopenia   Objective: Vitals:   09/07/24 0633 09/07/24 1000 09/07/24 2004 09/08/24 0347  BP: 129/73  109/61 139/62  Pulse: 96  66 (!) 50  Resp: 18  18 18   Temp: 99.1 F (37.3 C)  97.8 F (36.6 C) 98.6 F (37 C)  TempSrc: Oral  Oral Oral  SpO2: 94% 93% 93% 95%  Weight:      Height:        Intake/Output Summary (Last 24 hours) at 09/08/2024 1559 Last data filed at 09/08/2024 1051 Gross per 24 hour  Intake 1551.12 ml  Output 150 ml  Net 1401.12 ml   Filed Weights   09/06/24 1247  Weight: 71 kg    Exam: General: Acutely ill-appearing, very deconditioned Cardiovascular: S1, S2 present Respiratory: Diminished breath sounds bilaterally Abdomen: Soft, epigastric tenderness, nondistended, bowel sounds present Musculoskeletal: Trace bilateral pedal edema noted Skin: Normal Psychiatry: Fair mood     Data Reviewed: CBC: Recent Labs  Lab 09/04/24 0858 09/06/24 1253 09/07/24 0700 09/08/24 0644  WBC 0.8* 0.1* 0.2* 0.2*  NEUTROABS 0.6* 0.0* 0.0* 0.1*  HGB 12.2* 12.1*  10.9* 10.3*  HCT 37.0* 37.4* 33.1* 31.5*  MCV 86.7 88.4 87.1 87.0  PLT 111* 46* 31* 22*   Basic Metabolic Panel: Recent Labs  Lab 09/04/24 0858 09/06/24 1253 09/07/24 0700 09/08/24 0644 09/08/24 0654  NA 140 134* 134* 136  --   K 3.7 3.7 3.5 3.0*  --   CL 105 98 98 101  --   CO2 27 27 27 25   --   GLUCOSE 146* 165* 153* 147*  --   BUN 14 12 13 18   --   CREATININE 1.15 1.03 1.14 1.12  --   CALCIUM  8.1* 8.3* 7.9* 7.7*  --   MG  --   --  1.9  --  1.9   GFR: Estimated Creatinine Clearance: 50.9 mL/min (by C-G formula based on SCr of 1.12 mg/dL). Liver Function Tests: Recent Labs  Lab 09/04/24 0858  AST 12*  ALT 9  ALKPHOS 63  BILITOT 0.5  PROT 5.7*  ALBUMIN 3.3*   No results for input(s): LIPASE, AMYLASE in the last 168 hours. No results for input(s): AMMONIA in the last 168 hours. Coagulation Profile: No results for input(s): INR, PROTIME in the last 168 hours. Cardiac Enzymes: No results for input(s): CKTOTAL, CKMB, CKMBINDEX, TROPONINI in the last 168 hours. BNP (last 3 results) Recent Labs    08/15/24 1242 09/06/24 1252  PROBNP 1,200.0* 2,376.0*   HbA1C: No results for input(s): HGBA1C in the last 72 hours. CBG: Recent Labs  Lab 09/07/24 2005 09/07/24 2337 09/08/24 0350 09/08/24 0736 09/08/24 1159  GLUCAP 163* 152* 145* 145* 160*   Lipid Profile: No results for input(s): CHOL, HDL, LDLCALC, TRIG, CHOLHDL, LDLDIRECT in the  last 72 hours. Thyroid  Function Tests: No results for input(s): TSH, T4TOTAL, FREET4, T3FREE, THYROIDAB in the last 72 hours. Anemia Panel: No results for input(s): VITAMINB12, FOLATE, FERRITIN, TIBC, IRON, RETICCTPCT in the last 72 hours. Urine analysis:    Component Value Date/Time   COLORURINE AMBER (A) 09/07/2024 1638   APPEARANCEUR HAZY (A) 09/07/2024 1638   LABSPEC 1.036 (H) 09/07/2024 1638   PHURINE 5.0 09/07/2024 1638   GLUCOSEU 50 (A) 09/07/2024 1638   HGBUR  MODERATE (A) 09/07/2024 1638   BILIRUBINUR NEGATIVE 09/07/2024 1638   KETONESUR 5 (A) 09/07/2024 1638   PROTEINUR 100 (A) 09/07/2024 1638   NITRITE NEGATIVE 09/07/2024 1638   LEUKOCYTESUR NEGATIVE 09/07/2024 1638   Sepsis Labs: @LABRCNTIP (procalcitonin:4,lacticidven:4)  ) Recent Results (from the past 240 hours)  Resp panel by RT-PCR (RSV, Flu A&B, Covid) Anterior Nasal Swab     Status: None   Collection Time: 09/06/24  1:05 PM   Specimen: Anterior Nasal Swab  Result Value Ref Range Status   SARS Coronavirus 2 by RT PCR NEGATIVE NEGATIVE Final    Comment: (NOTE) SARS-CoV-2 target nucleic acids are NOT DETECTED.  The SARS-CoV-2 RNA is generally detectable in upper respiratory specimens during the acute phase of infection. The lowest concentration of SARS-CoV-2 viral copies this assay can detect is 138 copies/mL. A negative result does not preclude SARS-Cov-2 infection and should not be used as the sole basis for treatment or other patient management decisions. A negative result may occur with  improper specimen collection/handling, submission of specimen other than nasopharyngeal swab, presence of viral mutation(s) within the areas targeted by this assay, and inadequate number of viral copies(<138 copies/mL). A negative result must be combined with clinical observations, patient history, and epidemiological information. The expected result is Negative.  Fact Sheet for Patients:  bloggercourse.com  Fact Sheet for Healthcare Providers:  seriousbroker.it  This test is no t yet approved or cleared by the [place]  FDA and  has been authorized for detection and/or diagnosis of SARS-CoV-2 by FDA under an Emergency Use Authorization (EUA). This EUA will remain  in effect (meaning this test can be used) for the duration of the COVID-19 declaration under Section 564(b)(1) of the Act, 21 U.S.C.section 360bbb-3(b)(1), unless the  authorization is terminated  or revoked sooner.       Influenza A by PCR NEGATIVE NEGATIVE Final   Influenza B by PCR NEGATIVE NEGATIVE Final    Comment: (NOTE) The Xpert Xpress SARS-CoV-2/FLU/RSV plus assay is intended as an aid in the diagnosis of influenza from Nasopharyngeal swab specimens and should not be used as a sole basis for treatment. Nasal washings and aspirates are unacceptable for Xpert Xpress SARS-CoV-2/FLU/RSV testing.  Fact Sheet for Patients: bloggercourse.com  Fact Sheet for Healthcare Providers: seriousbroker.it  This test is not yet approved or cleared by the [place]  FDA and has been authorized for detection and/or diagnosis of SARS-CoV-2 by FDA under an Emergency Use Authorization (EUA). This EUA will remain in effect (meaning this test can be used) for the duration of the COVID-19 declaration under Section 564(b)(1) of the Act, 21 U.S.C. section 360bbb-3(b)(1), unless the authorization is terminated or revoked.     Resp Syncytial Virus by PCR NEGATIVE NEGATIVE Final    Comment: (NOTE) Fact Sheet for Patients: bloggercourse.com  Fact Sheet for Healthcare Providers: seriousbroker.it  This test is not yet approved or cleared by the [place]  FDA and has been authorized for detection and/or diagnosis of SARS-CoV-2 by FDA under an Emergency Use  Authorization (EUA). This EUA will remain in effect (meaning this test can be used) for the duration of the COVID-19 declaration under Section 564(b)(1) of the Act, 21 U.S.C. section 360bbb-3(b)(1), unless the authorization is terminated or revoked.  Performed at [facility], [address] [place], [place] [address]   C Difficile Quick Screen w PCR reflex     Status: Abnormal   Collection Time: 09/07/24  6:36 AM   Specimen: STOOL  Result Value Ref Range Status   C Diff antigen  POSITIVE (A) NEGATIVE Final   C Diff toxin POSITIVE (A) NEGATIVE Final   C Diff interpretation Toxin producing C. difficile detected.  Final    Comment: CRITICAL RESULT CALLED TO, READ BACK BY AND VERIFIED WITH: ENOLA PARAS RN AT 9268 ON 09/07/2024 BY PRUDY POUR Performed at [facility], [address] [place], [place] [address]   Gastrointestinal Panel by PCR , Stool     Status: None   Collection Time: 09/07/24  7:34 AM   Specimen: Stool  Result Value Ref Range Status   Campylobacter species NOT DETECTED NOT DETECTED Final   Plesimonas shigelloides NOT DETECTED NOT DETECTED Final   Salmonella species NOT DETECTED NOT DETECTED Final   Yersinia enterocolitica NOT DETECTED NOT DETECTED Final   Vibrio species NOT DETECTED NOT DETECTED Final   Vibrio cholerae NOT DETECTED NOT DETECTED Final   Enteroaggregative E coli (EAEC) NOT DETECTED NOT DETECTED Final   Enteropathogenic E coli (EPEC) NOT DETECTED NOT DETECTED Final   Enterotoxigenic E coli (ETEC) NOT DETECTED NOT DETECTED Final   Shiga like toxin producing E coli (STEC) NOT DETECTED NOT DETECTED Final   Shigella/Enteroinvasive E coli (EIEC) NOT DETECTED NOT DETECTED Final   Cryptosporidium NOT DETECTED NOT DETECTED Final   Cyclospora cayetanensis NOT DETECTED NOT DETECTED Final   Entamoeba histolytica NOT DETECTED NOT DETECTED Final   Giardia lamblia NOT DETECTED NOT DETECTED Final   Adenovirus F40/41 NOT DETECTED NOT DETECTED Final   Astrovirus NOT DETECTED NOT DETECTED Final   Norovirus GI/GII NOT DETECTED NOT DETECTED Final   Rotavirus A NOT DETECTED NOT DETECTED Final   Sapovirus (I, II, IV, and V) NOT DETECTED NOT DETECTED Final    Comment: Performed at [facility], [address] [place], [place] [address]  Culture, blood (Routine X 2) w Reflex to ID Panel     Status: None (Preliminary result)   Collection Time: 09/07/24  9:01 AM   Specimen: BLOOD  Result Value Ref Range Status   Specimen  Description   Final    BLOOD BLOOD RIGHT ARM Performed at [facility], [address] [place], [place] [address]    Special Requests   Final    BOTTLES DRAWN AEROBIC AND ANAEROBIC Blood Culture results may not be optimal due to an inadequate volume of blood received in culture bottles Performed at [facility], [address], [place], [place] [address]    Culture   Final    NO GROWTH < 24 HOURS Performed at [facility], [address] [place], [place] [address]    Report Status PENDING  Incomplete  Culture, blood (Routine X 2) w Reflex to ID Panel     Status: None (Preliminary result)   Collection Time: 09/07/24  9:05 AM   Specimen: BLOOD  Result Value Ref Range Status   Specimen Description   Final    BLOOD BLOOD RIGHT ARM Performed at [facility], [address], [place], [place] [address]    Special Requests   Final    BOTTLES DRAWN AEROBIC ONLY Blood Culture adequate volume Performed at [facility], [address] [place], [place] [address]    Culture   Final    NO GROWTH < 24 HOURS Performed at [facility], [address], [place], [place] [address]    Report Status PENDING  Incomplete  MRSA Next Gen by PCR, Nasal     Status: None   Collection Time: 09/07/24 11:29 AM   Specimen: Nasal Mucosa; Nasal Swab  Result Value Ref Range Status   MRSA by PCR Next Gen NOT DETECTED NOT DETECTED Final    Comment: (NOTE) The GeneXpert MRSA Assay (FDA approved for NASAL specimens only), is one component of a comprehensive MRSA colonization surveillance program. It is not intended to diagnose MRSA infection nor to guide or monitor treatment for MRSA infections. Test performance is not FDA approved in patients less than 13 years old. Performed at [facility], [address], [place], [place] [address]       Studies: No results found.  Scheduled Meds:  feeding  supplement  237 mL Oral BID BM   fidaxomicin   200 mg Oral BID   insulin  aspart  0-6 Units Subcutaneous Q4H   pantoprazole   40 mg Oral Daily   sodium chloride  flush  3 mL Intravenous Q12H   sucralfate   1 g Oral TID WC & HS   tamsulosin   0.4 mg Oral QPM    Continuous Infusions:  sodium chloride  100 mL/hr at 09/08/24 9092   ceFEPime  (MAXIPIME ) IV 2 g (09/08/24 1423)   famotidine  (PEPCID ) IV 20 mg (09/08/24 1308)   vancomycin  1,250 mg (09/08/24 1112)     LOS: 2 days     Lebron JINNY Cage, MD [facility]  If 7PM-7AM, please contact night-coverage [email or web address] 09/08/2024, 3:59 PM    "

## 2024-09-08 NOTE — Evaluation (Signed)
 Clinical/Bedside Swallow Evaluation Patient Details  Name: Edward [name] MRN: [id number] Date of Birth: 1940/10/02  Today's Date: 09/08/2024 Time: SLP Start Time (ACUTE ONLY): 0948 SLP Stop Time (ACUTE ONLY): 1005 SLP Time Calculation (min) (ACUTE ONLY): 17 min  Past Medical History:  Past Medical History:  Diagnosis Date   BPH (benign prostatic hyperplasia)    Cancer (HCC)    Cataract    surgical removal bilateral   CKD (chronic kidney disease) stage 3, GFR 30-59 ml/min (HCC)    DM (diabetes mellitus), type 2 (HCC)    ED (erectile dysfunction)    GERD (gastroesophageal reflux disease)    History of adenomatous polyp of colon 11/02/2017   Tubular adenoma 2013; recommended 5 year follow up   History of kidney stones    Hyperlipidemia    Hypertension    IBS (irritable bowel syndrome)    Kidney stones 02/11/2020   LBBB (left bundle branch block)    Lung nodule    Personal history of kidney stones    Vitamin D  deficiency    Past Surgical History:  Past Surgical History:  Procedure Laterality Date   CATARACT EXTRACTION Right 2015   CATARACT EXTRACTION W/ INTRAOCULAR LENS IMPLANT  2013   left   COLONOSCOPY  2013   TA   ENDOBRONCHIAL ULTRASOUND Bilateral 07/30/2024   Procedure: ENDOBRONCHIAL ULTRASOUND (EBUS);  Surgeon: Isadora Hose, MD;  Location: [facility] ORS;  Service: Pulmonary;  Laterality: Bilateral;   EXTRACORPOREAL SHOCK WAVE LITHOTRIPSY Left 12/11/2017   Procedure: LEFT EXTRACORPOREAL SHOCK WAVE LITHOTRIPSY (ESWL);  Surgeon: Carolee [place]wood JONETTA DOUGLAS, MD;  Location: [facility] OR[facility];  Service: Urology;  Laterality: Left;   KIDNEY STONE SURGERY     POLYPECTOMY     VIDEO BRONCHOSCOPY WITH ENDOBRONCHIAL ULTRASOUND Bilateral 07/18/2024   Procedure: BRONCHOSCOPY, WITH EBUS;  Surgeon: Catherine Cools, MD;  Location: [facility];  Service: Pulmonary;  Laterality: Bilateral;   HPI:  83 yo male admitted 09/06/24 with chest pain. CXR: stable RLL opacity. No prior ST intervention. 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 CN exam WFL. Pt is missing dentition (partials are at home). Volitional cough is weak. Pt accepted trials of ice chips, thin liquids, and puree textures. Timely oral prep and adequate clearing observed. Laryngeal elevation adequate per palpation. No cough response elicited following multiple presentations, including 3oz water challenge. Pt declined solid texture. Pt reports globus sensation and exhibits significant belching after PO presentations, raising suspicion for primary esophageal dysphagia. Recommend consideration of a regular barium swallow (esophagram) to evaluate esophageal motility, if within pt/family wishes. ST will follow to provide education after results are available.  SLP Visit Diagnosis: Dysphagia, unspecified (R13.10)    Aspiration Risk  Mild aspiration risk    Diet Recommendation Other (Comment) (full liquid)    Liquid Administration via: Straw;Cup Medication Administration:  (as tolerated) Supervision: Patient able to self feed Compensations: Minimize environmental distractions;Slow rate;Small sips/bites Postural Changes: Remain upright for at least 30 minutes after po intake;Seated upright at 90 degrees    Other Recommendations Oral Care Recommendations: Oral care BID     Swallow Evaluation Recommendations  BaSw   Assistance Recommended at Discharge  TBD  Functional Status Assessment Patient has had a recent decline in their  functional status and demonstrates the ability to make significant improvements in function in a reasonable and predictable amount of time.  Frequency and Duration min 1 x/week  2 weeks       Prognosis Prognosis for improved oropharyngeal function:  Fair Barriers to Reach Goals: Severity of deficits      Swallow Study   General Date of Onset: 09/06/24 HPI: 83 yo male admitted 09/06/24 with chest pain. CXR: stable RLL opacity. No prior ST intervention. 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, chemo 9d PTA), BPH, CKD3, DM2, GERD, HTN, HLD, NSVT, BPH, IBS Type of Study: Bedside Swallow Evaluation Previous Swallow Assessment: none Diet Prior to this Study: Clear liquid diet Temperature Spikes Noted: No Respiratory Status: Room air History of Recent Intubation: No Behavior/Cognition: Alert;Cooperative Oral Cavity Assessment: Within Functional Limits Oral Care Completed by SLP: Yes Oral Cavity - Dentition: Missing dentition Vision: Functional for self-feeding Self-Feeding Abilities: Able to feed self Patient Positioning: Upright in bed Baseline Vocal Quality: Normal Volitional Cough: Weak Volitional Swallow: Able to elicit    Oral/Motor/Sensory Function Overall Oral Motor/Sensory Function: Generalized oral weakness   Ice Chips Ice chips: Within functional limits Presentation: Spoon   Thin Liquid Thin Liquid: Within functional limits Presentation: Cup;Straw Other Comments: pt passed 3oz water challenge without overt s/s aspiration, indicating low likelihood of aspiration    Nectar Thick Nectar Thick Liquid: Not tested   Honey Thick Honey Thick Liquid: Not tested   Puree Puree: Within functional limits Presentation: Spoon   Solid     Solid: Not tested Other Comments: pt declined     Libbie Bartley B. Dory, MSP, CCC-SLP Speech Language Pathologist Office: [phone]  Dory Caprice Daring 09/08/2024,10:17 AM

## 2024-09-08 NOTE — Consult Note (Signed)
 " Palliative Medicine Inpatient Consult Note  Consulting Provider: Donnamarie Lebron PARAS, MD   Reason for consult:   Palliative Care Consult Services Palliative Medicine Consult  Reason for Consult? Further goals of care discussion.  Presented with C. difficile colitis, severely neutropenic, lung cancer on chemo/radiation, thanks.   09/08/2024  HPI:  Per intake H&P --> Edward [name] is a 83 y.o. 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 Palliative care has been asked to support additional goals of care conversations.   Clinical Assessment/Goals of Care:  *Please note that this is a verbal dictation therefore any spelling or grammatical errors are due to the Dragon Medical One system interpretation.  I have reviewed medical records including EPIC notes, labs and imaging, received report from bedside RN, assessed the patient is lying in bed in no acute distress.    I met with Edward [name] and his wife, Edward [name] to further discuss diagnosis prognosis, GOC, EOL wishes, disposition and options.   I introduced Palliative Medicine as specialized medical care for people living with serious illness. It focuses on providing relief from the symptoms and stress of a serious illness. The goal is to improve quality of life for both the patient and the family.  Medical History Review and Understanding:  A review of Edward [name]'s past medical history significant for type 2 diabetes, hypertension, hyperlipidemia, non-small cell lung cancer, and BPH was completed  Social History:  Oaklee shares that he lives in [place],  [place] .  He and his wife Edward [name] have been married for 55 years.  He shares he has a son from her prior marriage and a son with his wife.  He had 4 grandchildren that sadly 1 died in a car accident.  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  Edward [name] enjoys playing volleyball and up until recently was playing every Monday evening. Edward [name] is a man of faith.   Functional and Nutritional State:  Preceding hospitalization, Edward [name] is able to complete all ADLs and IADLs. He did have good appetite up until starting chemotherapy.   Palliative Symptoms:  Loose stools in the setting of c-diff on active treatments.  Declined ability to eat and drink due to altered taste from chemotherapeutics.  Reflux symptoms after ingestion.   Advance Directives:  A detailed discussion was had today regarding advanced directives.  Patients wife shares that she does have ACP documents and plans to bring them in to be scanned into the EMR.   Code Status:  Concepts specific to code status, artifical feeding and hydration, continued IV antibiotics and rehospitalization was had.  The difference between a aggressive medical intervention path  and a palliative comfort care path for this patient at this time was had.   [place] is an established DNAR/DNI code status which I was able to confirm with both of them.  Discussion:  We discussed [name]'s current condition in the setting of his non small cell lung cancer which was diagnosed last month. We reviewed his tenuous health since starting  radiation and chemotherapy. We discussed the loss of his independence and his need to rely more on his wife and other family members.  We reviewed how difficult that is being that [name] was still actively working up until recently.  We reviewed the shock of the diagnosis  and how his life has transitioned since then.  Allowed patient to express himself through therapeutic listening as a form of emotional support.  Patient and I discussed his goals moving forward which are very much focused and getting treatment.  He shares that he would like to possibly be able to continue some degree of independence.  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 We discussed allowing his health to be taken one day at a time. We discussed the burden of the treatments he is getting on his physical form and how this can be quite challenging. We discussed the hope of improvement and his bodies acclimation to treatments.  We discussed worst case scenario(s) inclusive of not being able to maintain strength and nutrition. We reviewed what life would look like if Edward [name] was unable to tolerate treatment(s). I gently broached the topic of hospice is this were to become a reality.   At this point in time we reviewed the importance of ongoing support of symptoms, increasing nutritional intake, and mobilizing as often as possible.   Discussed the importance of continued conversation with family and their  medical providers regarding overall plan of care and treatment options, ensuring decisions are within the context of the patients values and GOCs.  Decision Maker: Edward [name] Johann, Emergency Contact: [phone]   SUMMARY OF RECOMMENDATIONS   DNAR/DNI  Allow time for outcomes  Patients goals remain for chemotherapy and radiation treatments to see if he may gain some independence back thereafter  Appreciate Dr. Gatha updating patients family per their request  OP Palliative Oncology symptom clinic referral  Ongoing PMT support  Code Status/Advance Care Planning: DNAR/DNI   Symptom Management:  Reflux: - Start low dose Protonix  daily - Upright for meals 30 minutes after ingestion - Low acid meals  Altered taste: - Nutrition Consult - Bland foods  Diarrhea: - Per  primary team  Weakness: - PT/OT - Daily mobility  Emotional Support: - Therapeutic Listening  Advanced Directives: - Patients wife plans to bring a copy  Palliative Prophylaxis:  Aspiration, Bowel Regimen, Delirium Protocol, Frequent Pain Assessment, Oral Care, Palliative Wound Care, and Turn Reposition  Additional Recommendations (Limitations, Scope, Preferences): Continue current care  Psycho-social/Spiritual:  Desire for further Chaplaincy support: Yes Additional Recommendations: Education on disease burden   Prognosis: Per Oncology  Discharge Planning: To be determined  Vitals:   09/07/24 2004 09/08/24 0347  BP: 109/61 139/62  Pulse: 66 (!) 50  Resp: 18 18  Temp: 97.8 F (36.6 C) 98.6 F (37 C)  SpO2: 93% 95%    Intake/Output Summary (Last 24 hours) at 09/08/2024 0753 Last data filed at 09/08/2024 0500 Gross per 24 hour  Intake 1791.12 ml  Output 150 ml  Net 1641.12 ml   Last Weight  Most recent update: 09/06/2024 12:47 PM    Weight  71 kg (156 lb 8.4 oz)            LABS: CBC:    Component Value Date/Time   WBC 0.2 (LL) 09/08/2024 0644   HGB 10.3 (L) 09/08/2024 0644   HGB 12.2 (L) 09/04/2024 0858   HCT 31.5 (L) 09/08/2024 0644   PLT 22 (LL) 09/08/2024 0644   PLT 111 (L) 09/04/2024  0858   MCV 87.0 09/08/2024 0644   NEUTROABS 0.1 (LL) 09/08/2024 0644   LYMPHSABS 0.1 (L) 09/08/2024 0644   MONOABS 0.0 (L) 09/08/2024 0644   EOSABS 0.0 09/08/2024 0644   BASOSABS 0.0 09/08/2024 0644   Comprehensive Metabolic Panel:    Component Value Date/Time   NA 136 09/08/2024 0644   K 3.0 (L) 09/08/2024 0644   CL 101 09/08/2024 0644   CO2 25 09/08/2024 0644   BUN 18 09/08/2024 0644   CREATININE 1.12 09/08/2024 0644   CREATININE 1.15 09/04/2024 0858   CREATININE 1.34 (H) 08/22/2023 1156   GLUCOSE 147 (H) 09/08/2024 0644   CALCIUM  7.7 (L) 09/08/2024 0644   AST 12 (L) 09/04/2024 0858   ALT 9 09/04/2024 0858   ALKPHOS 63 09/04/2024 0858   BILITOT 0.5  09/04/2024 0858   PROT 5.7 (L) 09/04/2024 0858   ALBUMIN 3.3 (L) 09/04/2024 0858   Gen:  Elderly Caucasian M chronically ill appearing HEENT: moist mucous membranes CV: Regular rate and rhythm  PULM:On RA, breathing is even and nonlabored   ABD: soft/nontender  EXT: No edema  Neuro: Alert and oriented x2  PPS:   This conversation/these recommendations were discussed with patient primary care team, Dr. Donnamarie ______________________________________________________ Rosaline Becton [facility] Palliative Medicine Team Team Cell Phone: [phone] Please utilize secure chat with additional questions, if there is no response within 30 minutes please call the above phone number  Total Time: 75 Billing based on MDM: High  Palliative Medicine Team providers are available by phone from 7am to 7pm daily and can be reached through the team cell phone.  Should this patient require assistance outside of these hours, please call the patient's attending physician.  "

## 2024-09-08 NOTE — Plan of Care (Signed)
" °  Problem: Education: Goal: Knowledge of General Education information will improve Description: Including pain rating scale, medication(s)/side effects and non-pharmacologic comfort measures Outcome: Progressing   Problem: Health Behavior/Discharge Planning: Goal: Ability to manage health-related needs will improve Outcome: Progressing   Problem: Clinical Measurements: Goal: Diagnostic test results will improve Outcome: Progressing   Problem: Clinical Measurements: Goal: Cardiovascular complication will be avoided Outcome: Progressing   Problem: Activity: Goal: Risk for activity intolerance will decrease Outcome: Progressing   Problem: Coping: Goal: Level of anxiety will decrease Outcome: Progressing   Problem: Elimination: Goal: Will not experience complications related to bowel motility Outcome: Progressing Goal: Will not experience complications related to urinary retention Outcome: Progressing   Problem: Pain Managment: Goal: General experience of comfort will improve and/or be controlled Outcome: Progressing   Problem: Safety: Goal: Ability to remain free from injury will improve Outcome: Progressing   Problem: Skin Integrity: Goal: Risk for impaired skin integrity will decrease Outcome: Progressing   Problem: Education: Goal: Ability to describe self-care measures that may prevent or decrease complications (Diabetes Survival Skills Education) will improve Outcome: Progressing Goal: Individualized Educational Video(s) Outcome: Progressing   Problem: Coping: Goal: Ability to adjust to condition or change in health will improve Outcome: Progressing   Problem: Health Behavior/Discharge Planning: Goal: Ability to identify and utilize available resources and services will improve Outcome: Progressing Goal: Ability to manage health-related needs will improve Outcome: Progressing   Problem: Metabolic: Goal: Ability to maintain appropriate glucose levels will  improve Outcome: Progressing   Problem: Skin Integrity: Goal: Risk for impaired skin integrity will decrease Outcome: Progressing   "

## 2024-09-09 ENCOUNTER — Ambulatory Visit

## 2024-09-09 ENCOUNTER — Other Ambulatory Visit (HOSPITAL_COMMUNITY): Payer: Self-pay

## 2024-09-09 ENCOUNTER — Inpatient Hospital Stay (HOSPITAL_COMMUNITY)

## 2024-09-09 ENCOUNTER — Encounter: Payer: Self-pay | Admitting: *Deleted

## 2024-09-09 ENCOUNTER — Ambulatory Visit (HOSPITAL_COMMUNITY)

## 2024-09-09 ENCOUNTER — Other Ambulatory Visit (HOSPITAL_COMMUNITY)

## 2024-09-09 ENCOUNTER — Telehealth (HOSPITAL_COMMUNITY): Payer: Self-pay | Admitting: Pharmacy Technician

## 2024-09-09 DIAGNOSIS — T451X5A Adverse effect of antineoplastic and immunosuppressive drugs, initial encounter: Secondary | ICD-10-CM | POA: Diagnosis not present

## 2024-09-09 DIAGNOSIS — C3411 Malignant neoplasm of upper lobe, right bronchus or lung: Secondary | ICD-10-CM

## 2024-09-09 DIAGNOSIS — Z66 Do not resuscitate: Secondary | ICD-10-CM | POA: Diagnosis not present

## 2024-09-09 DIAGNOSIS — D701 Agranulocytosis secondary to cancer chemotherapy: Secondary | ICD-10-CM | POA: Diagnosis not present

## 2024-09-09 DIAGNOSIS — Z515 Encounter for palliative care: Secondary | ICD-10-CM | POA: Diagnosis not present

## 2024-09-09 DIAGNOSIS — R197 Diarrhea, unspecified: Secondary | ICD-10-CM | POA: Diagnosis not present

## 2024-09-09 DIAGNOSIS — Z7189 Other specified counseling: Secondary | ICD-10-CM | POA: Diagnosis not present

## 2024-09-09 DIAGNOSIS — C3490 Malignant neoplasm of unspecified part of unspecified bronchus or lung: Secondary | ICD-10-CM | POA: Diagnosis not present

## 2024-09-09 DIAGNOSIS — A0472 Enterocolitis due to Clostridium difficile, not specified as recurrent: Secondary | ICD-10-CM | POA: Diagnosis not present

## 2024-09-09 LAB — BASIC METABOLIC PANEL WITH GFR
Anion gap: 11 (ref 5–15)
BUN: 15 mg/dL (ref 8–23)
CO2: 21 mmol/L — ABNORMAL LOW (ref 22–32)
Calcium: 7.5 mg/dL — ABNORMAL LOW (ref 8.9–10.3)
Chloride: 105 mmol/L (ref 98–111)
Creatinine, Ser: 0.84 mg/dL (ref 0.61–1.24)
GFR, Estimated: >60 mL/min
Glucose, Bld: 98 mg/dL (ref 70–99)
Potassium: 3.6 mmol/L (ref 3.5–5.1)
Sodium: 137 mmol/L (ref 135–145)

## 2024-09-09 LAB — CBC WITH DIFFERENTIAL/PLATELET
Abs Immature Granulocytes: 0 K/uL (ref 0.00–0.07)
Basophils Absolute: 0 K/uL (ref 0.0–0.1)
Basophils Relative: 0 %
Eosinophils Absolute: 0 K/uL (ref 0.0–0.5)
Eosinophils Relative: 3 %
HCT: 29.5 % — ABNORMAL LOW (ref 39.0–52.0)
Hemoglobin: 9.7 g/dL — ABNORMAL LOW (ref 13.0–17.0)
Immature Granulocytes: 0 %
Lymphocytes Relative: 16 %
Lymphs Abs: 0.1 K/uL — ABNORMAL LOW (ref 0.7–4.0)
MCH: 28.8 pg (ref 26.0–34.0)
MCHC: 32.9 g/dL (ref 30.0–36.0)
MCV: 87.5 fL (ref 80.0–100.0)
Monocytes Absolute: 0.1 K/uL (ref 0.1–1.0)
Monocytes Relative: 13 %
Neutro Abs: 0.5 K/uL — ABNORMAL LOW (ref 1.7–7.7)
Neutrophils Relative %: 68 %
Platelets: 19 K/uL — CL (ref 150–400)
RBC: 3.37 MIL/uL — ABNORMAL LOW (ref 4.22–5.81)
RDW: 13.6 % (ref 11.5–15.5)
WBC: 0.7 K/uL — CL (ref 4.0–10.5)
nRBC: 0 % (ref 0.0–0.2)

## 2024-09-09 LAB — GLUCOSE, CAPILLARY
Glucose-Capillary: 107 mg/dL — ABNORMAL HIGH (ref 70–99)
Glucose-Capillary: 123 mg/dL — ABNORMAL HIGH (ref 70–99)
Glucose-Capillary: 140 mg/dL — ABNORMAL HIGH (ref 70–99)
Glucose-Capillary: 149 mg/dL — ABNORMAL HIGH (ref 70–99)
Glucose-Capillary: 156 mg/dL — ABNORMAL HIGH (ref 70–99)
Glucose-Capillary: 180 mg/dL — ABNORMAL HIGH (ref 70–99)
Glucose-Capillary: 90 mg/dL (ref 70–99)
Glucose-Capillary: 96 mg/dL (ref 70–99)

## 2024-09-09 LAB — LEGIONELLA PNEUMOPHILA SEROGP 1 UR AG: L. pneumophila Serogp 1 Ur Ag: NEGATIVE

## 2024-09-09 MED ORDER — BANATROL TF EN LIQD
60.0000 mL | Freq: Two times a day (BID) | ENTERAL | Status: DC
  Administered 2024-09-09 – 2024-09-16 (×15): 60 mL via ORAL
  Filled 2024-09-09 (×16): qty 60

## 2024-09-09 MED ORDER — FAMOTIDINE 20 MG PO TABS
20.0000 mg | ORAL_TABLET | Freq: Two times a day (BID) | ORAL | Status: AC
  Administered 2024-09-09 – 2024-09-16 (×15): 20 mg via ORAL
  Filled 2024-09-09 (×15): qty 1

## 2024-09-09 MED ORDER — FILGRASTIM-AAFI 300 MCG/0.5ML IJ SOSY
300.0000 ug | PREFILLED_SYRINGE | Freq: Every day | INTRAMUSCULAR | Status: DC
  Administered 2024-09-09: 300 ug via SUBCUTANEOUS
  Filled 2024-09-09: qty 0.5

## 2024-09-09 MED ORDER — ORAL CARE MOUTH RINSE: 15.0000 mL | OROMUCOSAL | Status: DC | PRN

## 2024-09-09 MED ORDER — FILGRASTIM 300 MCG/ML IJ SOLN
300.0000 ug | Freq: Every day | INTRAMUSCULAR | Status: DC
  Filled 2024-09-09: qty 1

## 2024-09-09 MED ORDER — BOOST / RESOURCE BREEZE PO LIQD CUSTOM
1.0000 | Freq: Three times a day (TID) | ORAL | Status: DC
  Administered 2024-09-09 – 2024-09-14 (×10): 1 via ORAL

## 2024-09-09 NOTE — Evaluation (Signed)
 Occupational Therapy Evaluation Patient Details Name: Edward [name] MRN: [id number] DOB: 09-Feb-1941 Today's Date: 09/09/2024   History of Present Illness   Edward [name] is an 83 yo male presents with significant fatigue, burning midsternal chest pain/indigestion, abdominal pain, vomiting and diarrhea, with chills; admitted with C. difficile colitis with underlying neutropenia. PMH: non-small cell lung cancer diagnosed in November 2025, and possible atypical carcinoid, CKD stage III, type 2 diabetes mellitus, hypertension, hyperlipidemia, NSVT, and BPH     Clinical Impressions PTA, patient lives with wife and was completely independent prior to illness with driving, A/IADL's and amb with weekly volleyball at his church. Currently, patient presents with deficits outlined below (see OT Problem List for details) most significantly decreased cognition, now on supplemental O2 via [place] with deficits including activity tolerance, balance and generalized muscle strength limiting BADL's and functional mobility performance. Recommending most likely an AD for ambulation safety (therapy still assessing) and family assist/S with HHOT services upon discharge from acute care hospital. Patient requires continued Acute care hospital level OT services to progress safety and functional performance and allow for discharge.       If plan is discharge home, recommend the following:   Assist for transportation;Assistance with cooking/housework;A little help with bathing/dressing/bathroom;A little help with walking and/or transfers;Help with stairs or ramp for entrance;Supervision due to cognitive status (initially)     Functional Status Assessment   Patient has had a recent decline in their functional status and demonstrates the ability to make significant improvements in function in a reasonable and predictable amount of time.     Equipment Recommendations   Other (comment) (AD for amb TBA)       Precautions/Restrictions   Precautions Precautions: Fall Recall of Precautions/Restrictions: Intact Precaution/Restrictions Comments: watch O2 Restrictions Weight Bearing Restrictions Per Provider Order: No     Mobility Bed Mobility Overal bed mobility: Needs Assistance Bed Mobility: Supine to Sit, Sit to Supine     Supine to sit: Supervision, HOB elevated Sit to supine: Contact guard assist, HOB elevated, Used rails   General bed mobility comments: HOB slightly elevated, no bedrail use,increased time    Transfers Overall transfer level: Needs assistance Equipment used: None Transfers: Sit to/from Stand, Bed to chair/wheelchair/BSC Sit to Stand: Contact guard assist     Step pivot transfers: Contact guard assist     General transfer comment: slightly unsteady with STS and SPT with RW support utilized for safety      Balance Overall balance assessment: Mild deficits observed, not formally tested                                         ADL either performed or assessed with clinical judgement   ADL Overall ADL's : Needs assistance/impaired Eating/Feeding: Set up;Sitting Eating/Feeding Details (indicate cue type and reason): just back from swallow test, now on soft diet Grooming: Wash/dry hands;Wash/dry face;Oral care;Contact guard assist;Sitting;Cueing for sequencing   Upper Body Bathing: Contact guard assist;Sitting   Lower Body Bathing: Minimal assistance;Sit to/from stand   Upper Body Dressing : Contact guard assist;Sitting   Lower Body Dressing: Minimal assistance;Moderate assistance;Sit to/from stand;Cueing for sequencing   Toilet Transfer: Contact guard assist;BSC/3in1;Rolling walker (2 wheels)   Toileting- Clothing Manipulation and Hygiene: Minimal assistance;Sitting/lateral lean       Functional mobility during ADLs: Contact guard assist;Rolling walker (2 wheels);Cueing for sequencing General ADL Comments: benefitting from UB  support  in standing     Vision Baseline Vision/History: 0 No visual deficits Ability to See in Adequate Light: 0 Adequate Patient Visual Report: No change from baseline Vision Assessment?: No apparent visual deficits     Perception Perception: Not tested       Praxis Praxis: Not tested       Pertinent Vitals/Pain Pain Assessment Pain Assessment: No/denies pain     Extremity/Trunk Assessment Upper Extremity Assessment Upper Extremity Assessment: Right hand dominant;Generalized weakness   Lower Extremity Assessment Lower Extremity Assessment: Generalized weakness   Cervical / Trunk Assessment Cervical / Trunk Assessment: Normal   Communication Communication Communication: Impaired Factors Affecting Communication: Hearing impaired   Cognition Arousal: Alert Behavior During Therapy: WFL for tasks assessed/performed Cognition: Cognition impaired     Awareness: Intellectual awareness intact, Online awareness intact Memory impairment (select all impairments): Short-term memory Attention impairment (select first level of impairment): Selective attention Executive functioning impairment (select all impairments): Organization OT - Cognition Comments: Occasional wrong answers with home set up, question HO vs comprehensin                 Following commands: Intact       Cueing  General Comments   Cueing Techniques: Verbal cues  Patient on 2 ltrs O2 via [place], HR 64 bpm during session           Home Living Family/patient expects to be discharged to:: Private residence Living Arrangements: Spouse/significant other Available Help at Discharge: Family;Available 24 hours/day Type of Home: House Home Access: Stairs to enter;Ramped entrance Entrance Stairs-Number of Steps: 4 Entrance Stairs-Rails: Right Home Layout: One level     Bathroom Shower/Tub: Walk-in shower;Tub/shower unit;Door;Curtain (walk-in tub)   Bathroom Toilet: Handicapped height     Home  Equipment: Shower seat   Additional Comments: does not have AD for mobility      Prior Functioning/Environment Prior Level of Function : Independent/Modified Independent             Mobility Comments: pt reports ind with community ambulation, plays volleyball at church every Monday Night ADLs Comments: pt reports ind with ADLs/IADLs    OT Problem List: Decreased activity tolerance   OT Treatment/Interventions: Self-care/ADL training;Therapeutic exercise;Neuromuscular education;Energy conservation;DME and/or AE instruction;Therapeutic activities;Cognitive remediation/compensation;Patient/family education;Balance training      OT Goals(Current goals can be found in the care plan section)   Acute Rehab OT Goals Patient Stated Goal: to feel stronger OT Goal Formulation: With patient/family Time For Goal Achievement: 09/23/24 Potential to Achieve Goals: Fair   OT Frequency:  Min 2X/week       AM-PAC OT 6 Clicks Daily Activity     Outcome Measure Help from another person eating meals?: A Little Help from another person taking care of personal grooming?: A Little Help from another person toileting, which includes using toliet, bedpan, or urinal?: A Little Help from another person bathing (including washing, rinsing, drying)?: A Little Help from another person to put on and taking off regular upper body clothing?: A Little Help from another person to put on and taking off regular lower body clothing?: A Little 6 Click Score: 18   End of Session Equipment Utilized During Treatment: Gait belt;Rolling walker (2 wheels) Nurse Communication: Mobility status  Activity Tolerance: Patient tolerated treatment well Patient left: with call bell/phone within reach;in bed;with bed alarm set  OT Visit Diagnosis: Other abnormalities of gait and mobility (R26.89);Muscle weakness (generalized) (M62.81)  Time: 8484-8454 OT Time Calculation (min): 30 min Charges:  OT  General Charges $OT Visit: 1 Visit OT Evaluation $OT Eval Low Complexity: 1 Low OT Treatments $Self Care/Home Management : 8-22 mins  Genavie Boettger OT/L Acute Rehabilitation Department  [phone]  09/09/2024, 4:47 PM

## 2024-09-09 NOTE — Evaluation (Signed)
 Physical Therapy Evaluation Patient Details Name: Edward [name] MRN: [id number] DOB: Nov 21, 1940 Today's Date: 09/09/2024  History of Present Illness  Edward [name] is an 83 yo male presents with significant fatigue, burning midsternal chest pain/indigestion, abdominal pain, vomiting and diarrhea, with chills; admitted with C. difficile colitis with underlying neutropenia. PMH: non-small cell lung cancer diagnosed in November 2025, and possible atypical carcinoid, CKD stage III, type 2 diabetes mellitus, hypertension, hyperlipidemia, NSVT, and BPH  Clinical Impression  Pt admitted with above diagnosis. PTA, pt reports ind without AD, plays standing volleyball at church on Mondays, ind with ADLs/IADLs, lives with spouse, has ramp to enter home. On eval, pt with generalized weakness, slow to mobilize to bedside, CGA for sit to stand and step pivot transfers without AD. Pt intermittently holding to RW for steadying support with static standing, noted to be soiled in bowel movement, able to complete pericare with NT present assisting as needed. Pt without complaints, requests to ambulate after assisted in cleaning but noted HR Up to 157 and monitor reading afib, also oncology at door to see pt. Recommend HHPT at d/c, DME recs TBD pending progress in hospital. Pt currently with functional limitations due to the deficits listed below (see PT Problem List). Pt will benefit from acute skilled PT to increase their independence and safety with mobility to allow discharge.           If plan is discharge home, recommend the following: A little help with walking and/or transfers;A little help with bathing/dressing/bathroom;Assistance with cooking/housework;Assist for transportation;Help with stairs or ramp for entrance   Can travel by private vehicle        Equipment Recommendations Other (comment) (TBD)  Recommendations for Other Services       Functional Status Assessment Patient has had a recent  decline in their functional status and demonstrates the ability to make significant improvements in function in a reasonable and predictable amount of time.     Precautions / Restrictions        Mobility  Bed Mobility Overal bed mobility: Needs Assistance Bed Mobility: Supine to Sit     Supine to sit: Supervision, HOB elevated     General bed mobility comments: HOB slightly elevated, no bedrail use, slight increased time    Transfers Overall transfer level: Needs assistance Equipment used: None Transfers: Sit to/from Stand, Bed to chair/wheelchair/BSC Sit to Stand: Contact guard assist   Step pivot transfers: Contact guard assist       General transfer comment: slightly unsteady upon rising, reaching for furniture to steady self, step pivot to [facility] at bedside, provided RW for static standing that pt uses intermittently for statis standing support, CGA for steadying    Ambulation/Gait               General Gait Details: Tele monitor reading afib in standing, HR variable between 90-157 noted on monitor, ambulation not attempted  Stairs            Wheelchair Mobility     Tilt Bed    Modified Rankin (Stroke Patients Only)       Balance Overall balance assessment: Mild deficits observed, not formally tested                                           Pertinent Vitals/Pain Pain Assessment Pain Assessment: No/denies pain    Home Living Family/patient expects  to be discharged to:: Private residence Living Arrangements: Spouse/significant other Available Help at Discharge: Family;Available 24 hours/day Type of Home: House Home Access: Stairs to enter;Ramped entrance Entrance Stairs-Rails: Right Entrance Stairs-Number of Steps: 4   Home Layout: One level Home Equipment: None      Prior Function Prior Level of Function : Independent/Modified Independent             Mobility Comments: pt reports ind with community ambulation,  plays volleyball at church every Monday Night ADLs Comments: pt reports ind with ADLs/IADLs     Extremity/Trunk Assessment   Upper Extremity Assessment Upper Extremity Assessment: Defer to OT evaluation    Lower Extremity Assessment Lower Extremity Assessment: Generalized weakness (AROM WFL, strength grossly 3+/5)    Cervical / Trunk Assessment Cervical / Trunk Assessment: Normal  Communication   Communication Communication: Impaired Factors Affecting Communication: Hearing impaired    Cognition Arousal: Alert Behavior During Therapy: WFL for tasks assessed/performed   PT - Cognitive impairments: No apparent impairments                         Following commands: Intact       Cueing Cueing Techniques: Verbal cues     General Comments General comments (skin integrity, edema, etc.): Pt on 2L, HR 90-157 noted on tele monitor, pt denies dizziness, SOB, lightheadedness or fatigue with mobility    Exercises     Assessment/Plan    PT Assessment Patient needs continued PT services  PT Problem List Decreased strength;Decreased activity tolerance;Decreased balance       PT Treatment Interventions DME instruction;Gait training;Functional mobility training;Therapeutic activities;Therapeutic exercise;Balance training;Patient/family education    PT Goals (Current goals can be found in the Care Plan section)  Acute Rehab PT Goals Patient Stated Goal: return home, play volleyball PT Goal Formulation: With patient Time For Goal Achievement: 09/23/24 Potential to Achieve Goals: Good    Frequency Min 3X/week     Co-evaluation               AM-PAC PT 6 Clicks Mobility  Outcome Measure Help needed turning from your back to your side while in a flat bed without using bedrails?: None Help needed moving from lying on your back to sitting on the side of a flat bed without using bedrails?: None Help needed moving to and from a bed to a chair (including a  wheelchair)?: A Little Help needed standing up from a chair using your arms (e.g., wheelchair or bedside chair)?: A Little Help needed to walk in hospital room?: A Little Help needed climbing 3-5 steps with a railing? : A Little 6 Click Score: 20    End of Session Equipment Utilized During Treatment: Gait belt Activity Tolerance: Patient tolerated treatment well Patient left: in chair;with call bell/phone within reach;with chair alarm set;Other (comment) (oncology in room) Nurse Communication: Mobility status PT Visit Diagnosis: Other abnormalities of gait and mobility (R26.89);Muscle weakness (generalized) (M62.81)    Time: 8962-8940 PT Time Calculation (min) (ACUTE ONLY): 22 min   Charges:   PT Evaluation $PT Eval Moderate Complexity: 1 Mod   PT General Charges $$ ACUTE PT VISIT: 1 Visit         Tori Haydon Kalmar PT, DPT 09/09/2024, 11:09 AM

## 2024-09-09 NOTE — Progress Notes (Signed)
 "        [facility]  Date of Admission:  09/06/2024     Lines:  Peripheral iv's   Abx: 12/27-c difficid   12/26-c vanc 12/26-c cefepime  12/26-c metronidazole                                                                   Assessment: 83 y.o. male former smoker, ckd3, dm2, bph, lbbb, hx kidney stone, recent neoadjuvant chemo for neuroendocrine tumor right lung mass admitted with lethargy and diarrhea    Patient s/p 3 weeks augmentin  and taking up to day of admission. Now with worsening diarrhea and testing confirmed cdiff   Chest ct showed improved right lung opacity but also in setting recent chemo and xrt.    Potential confounder for immediate future diagnostics include possibility of acute radiation pneumonitis. Something to considered with new respiratory sx   Patient without frank fever this admission. And so far hx sx seems to be related to cdiff and chemo/xrt side effect     I am suprprised given the soft/jello appearing stool that it was allowed to be tested for cdiff. He does have epigastric discomfort/nausea not entirely clear if cdiff or some other process that is noninfectious     Reasonable though given immunosuppressant to cover empirically for sepsis until repeat bcx returns. Given cdiff confirmation and lack of respiratory sx or other localizing infectious disease syndrome, I wouldn't prolonged post-obstructive pna tx or empiric abx beyond blood cx turn around   Solid tumor/chemo associated infection is usually bacterial process with pna/bacteremia. Pseudomonas a high risk player and so can cover for such   Hopefully in 2-3 days if gi sx all from cdiff he should start feeling better. It should be turn around time from chemo side effect as well    --------- 09/09/24 id assessmetn Bcx negative Mrsa nares pcr negative  Diarrhea worsened to > 6times a day yesterday but seems to slow down today; appetite decent and eating a good  meal today lunch    Plan: Stop systemic abx Continue difficid If diarrhea becomes more formed and < 3 times a day and eating well reasonable to discharge Plan 10 days difficid to finish 09/17/2024 Maintain cdiff enteric precaution Id will sign off Discussed with primary team  Principal Problem:   Neutropenic Active Problems:   Obstructive pneumonia   Sepsis (HCC)   C. difficile colitis   Malignant neoplasm of lung (HCC)   Allergies[1]  Scheduled Meds:  famotidine   20 mg Oral BID   feeding supplement  1 Container Oral TID BM   fiber supplement (BANATROL TF)  60 mL Oral BID   fidaxomicin   200 mg Oral BID   filgrastim  (NIVESTYM ) SQ  300 mcg Subcutaneous q1800   insulin  aspart  0-6 Units Subcutaneous Q4H   pantoprazole   40 mg Oral Daily   sodium chloride  flush  3 mL Intravenous Q12H   sucralfate   1 g Oral TID WC & HS   tamsulosin   0.4 mg Oral QPM   Continuous Infusions:  sodium chloride  75 mL/hr at 09/09/24 1629   PRN Meds:.acetaminophen  **OR** acetaminophen , alum & mag hydroxide-simeth, ipratropium-albuterol , melatonin, ondansetron  **OR** ondansetron  (ZOFRAN ) IV, mouth rinse, oxyCODONE , phenylephrine -shark liver oil-mineral oil-petrolatum , sodium chloride  flush  SUBJECTIVE: Diarrhea better today Eating well No fever Remains leukopenic from chemo  Review of Systems: ROS All other ROS was negative, except mentioned above     OBJECTIVE: Vitals:   09/08/24 1300 09/08/24 1947 09/09/24 0431 09/09/24 1309  BP: 130/72 131/77 (!) 104/56 121/69  Pulse: 78 (!) 52 91 63  Resp:  18 18   Temp: 98 F (36.7 C) 98.2 F (36.8 C) 98.4 F (36.9 C) 97.6 F (36.4 C)  TempSrc: Oral Oral Oral Oral  SpO2: 98% 97% 97% 97%  Weight:      Height:       Body mass index is 23.11 kg/m.  Physical Exam  General/constitutional: no distress, pleasant HEENT: Normocephalic, PER, Conj Clear, EOMI, Oropharynx clear Neck supple CV: rrr no mrg Lungs: clear to auscultation, normal  respiratory effort Abd: Soft, Nontender Ext: no edema Skin: No Rash Neuro: nonfocal MSK: no peripheral joint swelling/tenderness/warmth; back spines nontender    Lab Results Lab Results  Component Value Date   WBC 0.7 (LL) 09/09/2024   HGB 9.7 (L) 09/09/2024   HCT 29.5 (L) 09/09/2024   MCV 87.5 09/09/2024   PLT 19 (LL) 09/09/2024    Lab Results  Component Value Date   CREATININE 0.84 09/09/2024   BUN 15 09/09/2024   NA 137 09/09/2024   K 3.6 09/09/2024   CL 105 09/09/2024   CO2 21 (L) 09/09/2024    Lab Results  Component Value Date   ALT 9 09/04/2024   AST 12 (L) 09/04/2024   ALKPHOS 63 09/04/2024   BILITOT 0.5 09/04/2024      Microbiology: Recent Results (from the past 240 hours)  Resp panel by RT-PCR (RSV, Flu A&B, Covid) Anterior Nasal Swab     Status: None   Collection Time: 09/06/24  1:05 PM   Specimen: Anterior Nasal Swab  Result Value Ref Range Status   SARS Coronavirus 2 by RT PCR NEGATIVE NEGATIVE Final    Comment: (NOTE) SARS-CoV-2 target nucleic acids are NOT DETECTED.  The SARS-CoV-2 RNA is generally detectable in upper respiratory specimens during the acute phase of infection. The lowest concentration of SARS-CoV-2 viral copies this assay can detect is 138 copies/mL. A negative result does not preclude SARS-Cov-2 infection and should not be used as the sole basis for treatment or other patient management decisions. A negative result may occur with  improper specimen collection/handling, submission of specimen other than nasopharyngeal swab, presence of viral mutation(s) within the areas targeted by this assay, and inadequate number of viral copies(<138 copies/mL). A negative result must be combined with clinical observations, patient history, and epidemiological information. The expected result is Negative.  Fact Sheet for Patients:  bloggercourse.com  Fact Sheet for Healthcare Providers:   seriousbroker.it  This test is no t yet approved or cleared by the [place]  FDA and  has been authorized for detection and/or diagnosis of SARS-CoV-2 by FDA under an Emergency Use Authorization (EUA). This EUA will remain  in effect (meaning this test can be used) for the duration of the COVID-19 declaration under Section 564(b)(1) of the Act, 21 U.S.C.section 360bbb-3(b)(1), unless the authorization is terminated  or revoked sooner.       Influenza A by PCR NEGATIVE NEGATIVE Final   Influenza B by PCR NEGATIVE NEGATIVE Final    Comment: (NOTE) The Xpert Xpress SARS-CoV-2/FLU/RSV plus assay is intended as an aid in the diagnosis of influenza from Nasopharyngeal swab specimens and should not be used as a sole basis for treatment. Nasal  washings and aspirates are unacceptable for Xpert Xpress SARS-CoV-2/FLU/RSV testing.  Fact Sheet for Patients: bloggercourse.com  Fact Sheet for Healthcare Providers: seriousbroker.it  This test is not yet approved or cleared by the [place]  FDA and has been authorized for detection and/or diagnosis of SARS-CoV-2 by FDA under an Emergency Use Authorization (EUA). This EUA will remain in effect (meaning this test can be used) for the duration of the COVID-19 declaration under Section 564(b)(1) of the Act, 21 U.S.C. section 360bbb-3(b)(1), unless the authorization is terminated or revoked.     Resp Syncytial Virus by PCR NEGATIVE NEGATIVE Final    Comment: (NOTE) Fact Sheet for Patients: bloggercourse.com  Fact Sheet for Healthcare Providers: seriousbroker.it  This test is not yet approved or cleared by the [place]  FDA and has been authorized for detection and/or diagnosis of SARS-CoV-2 by FDA under an Emergency Use Authorization (EUA). This EUA will remain in effect (meaning this test can be used) for  the duration of the COVID-19 declaration under Section 564(b)(1) of the Act, 21 U.S.C. section 360bbb-3(b)(1), unless the authorization is terminated or revoked.  Performed at [facility], [address] [place], [place] [address]   C Difficile Quick Screen w PCR reflex     Status: Abnormal   Collection Time: 09/07/24  6:36 AM   Specimen: STOOL  Result Value Ref Range Status   C Diff antigen POSITIVE (A) NEGATIVE Final   C Diff toxin POSITIVE (A) NEGATIVE Final   C Diff interpretation Toxin producing C. difficile detected.  Final    Comment: CRITICAL RESULT CALLED TO, READ BACK BY AND VERIFIED WITH: ENOLA PARAS RN AT 9268 ON 09/07/2024 BY PRUDY POUR Performed at [facility], [address], [place], [place] [address]   Gastrointestinal Panel by PCR , Stool     Status: None   Collection Time: 09/07/24  7:34 AM   Specimen: Stool  Result Value Ref Range Status   Campylobacter species NOT DETECTED NOT DETECTED Final   Plesimonas shigelloides NOT DETECTED NOT DETECTED Final   Salmonella species NOT DETECTED NOT DETECTED Final   Yersinia enterocolitica NOT DETECTED NOT DETECTED Final   Vibrio species NOT DETECTED NOT DETECTED Final   Vibrio cholerae NOT DETECTED NOT DETECTED Final   Enteroaggregative E coli (EAEC) NOT DETECTED NOT DETECTED Final   Enteropathogenic E coli (EPEC) NOT DETECTED NOT DETECTED Final   Enterotoxigenic E coli (ETEC) NOT DETECTED NOT DETECTED Final   Shiga like toxin producing E coli (STEC) NOT DETECTED NOT DETECTED Final   Shigella/Enteroinvasive E coli (EIEC) NOT DETECTED NOT DETECTED Final   Cryptosporidium NOT DETECTED NOT DETECTED Final   Cyclospora cayetanensis NOT DETECTED NOT DETECTED Final   Entamoeba histolytica NOT DETECTED NOT DETECTED Final   Giardia lamblia NOT DETECTED NOT DETECTED Final   Adenovirus F40/41 NOT DETECTED NOT DETECTED Final   Astrovirus NOT DETECTED NOT DETECTED Final   Norovirus GI/GII NOT  DETECTED NOT DETECTED Final   Rotavirus A NOT DETECTED NOT DETECTED Final   Sapovirus (I, II, IV, and V) NOT DETECTED NOT DETECTED Final    Comment: Performed at [facility], [address] [place], [place] [address]  Culture, blood (Routine X 2) w Reflex to ID Panel     Status: None (Preliminary result)   Collection Time: 09/07/24  9:01 AM   Specimen: BLOOD  Result Value Ref Range Status   Specimen Description   Final    BLOOD BLOOD RIGHT ARM Performed at  [facility], [address] [place], [place] [address]    Special Requests   Final    BOTTLES DRAWN AEROBIC AND ANAEROBIC Blood Culture results may not be optimal due to an inadequate volume of blood received in culture bottles Performed at [facility], [address], [place], [place] [address]    Culture   Final    NO GROWTH 2 DAYS Performed at [facility], [address], [place], [place] [address]    Report Status PENDING  Incomplete  Culture, blood (Routine X 2) w Reflex to ID Panel     Status: None (Preliminary result)   Collection Time: 09/07/24  9:05 AM   Specimen: BLOOD  Result Value Ref Range Status   Specimen Description   Final    BLOOD BLOOD RIGHT ARM Performed at [facility], [address] [place], [place] [address]    Special Requests   Final    BOTTLES DRAWN AEROBIC ONLY Blood Culture adequate volume Performed at [facility], [address], [place], [place] [address]    Culture   Final    NO GROWTH 2 DAYS Performed at [facility], [address] [place], [place] [address]    Report Status PENDING  Incomplete  MRSA Next Gen by PCR, Nasal     Status: None   Collection Time: 09/07/24 11:29 AM   Specimen: Nasal Mucosa; Nasal Swab  Result Value Ref Range Status   MRSA by PCR Next Gen NOT DETECTED NOT DETECTED Final    Comment: (NOTE) The GeneXpert MRSA Assay (FDA approved for NASAL specimens  only), is one component of a comprehensive MRSA colonization surveillance program. It is not intended to diagnose MRSA infection nor to guide or monitor treatment for MRSA infections. Test performance is not FDA approved in patients less than 48 years old. Performed at [facility], [address] [place], [place] [address]      Serology:   Imaging: If present, new imagings (plain films, ct scans, and mri) have been personally visualized and interpreted; radiology reports have been reviewed. Decision making incorporated into the Impression / Recommendations.   Constance ONEIDA Passer, MD [facility] [phone] pager    09/09/2024, 6:51 PM     [1]  Allergies Allergen Reactions   Bystolic [Nebivolol Hcl] Other (See Comments)    Reaction not recalled   "

## 2024-09-09 NOTE — Progress Notes (Signed)
 "  PROGRESS NOTE  Edward [name] FMW:[id number] DOB: 10/18/1940 DOA: 09/06/2024 PCP: [name], NP-C  HPI/Recap of past 24 hours: Edward [name] is a 83 y.o. 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  Patient's first chemotherapy dose was on December 16/2025, also undergoing radiation, with last day being on 10/03/2024.  Patient was recently seen at the cancer center on 12/24, noted to be neutropenic secondary to his chemo and received G-CSF injection. On 12/26 presented back to the cancer center for his second dose of G-CSF, due to his persistent symptoms, was sent to the ED for further management.  In the ED, vital signs fairly stable, labs show pancytopenia including severe neutropenia. CTA of his chest revealed right infahilar mass, unchanged right pleural effusion and right middle lobe consolidation, slightly decreased right lower lobe airspace disease all compared to his last CT. patient was started on IV antibiotics and admitted for further management.     Today, patient still complaining of diarrhea, but slowly resolving.  Denies any nausea/vomiting, still with some epigastric pain.    Assessment/Plan: Principal Problem:   Neutropenic Active Problems:   Obstructive pneumonia   Sepsis (HCC)   C. difficile colitis   Malignant neoplasm of lung (HCC)   C. difficile colitis with underlying neutropenia Nausea/vomiting Currently afebrile, with neutropenia BC x 2 NGTD, MRSA PCR negative Stool sample positive for C. difficile, GIP panel negative Lactic acid WNL DG abdomen no SBO, or  megacolon ID on board, continue p.o. Dificid  Discontinued systemic IV antibiotics after 48 hours given C. difficile infection Continue gentle IV fluids, advance diet Neutropenic and enteric precaution Telemetry, monitor closely.  Epigastric pain Possible esophageal dysphagia/radiation-induced Reports burning in nature Esophagram showed nonspecific esophageal dysmotility disorder SLP consulted, appreciate recs Continue famotidine  IV (hold PPI in the setting of C. Difficile), Carafate  suspension, IV morphine  as needed  Pancytopenia Likely 2/2 chemotherapy, with severe neutropenia Received filgrastim  x 2 doses in the past week prior to arrival, continue till [facility] >1500 Daily CBC, neutropenic precautions Oncology consulted  Hypokalemia Replace as needed  Possible postobstructive pneumonia/radiation induced pneumonitis ??Aspiration pneumonia Currently afebrile, with neutropenia Currently saturating well on room air Pro-Cal 0.73 CTA of his chest revealed right infahilar mass, unchanged right pleural effusion and right middle lobe consolidation, slightly decreased right lower lobe airspace disease all compared to his last CT Discontinue IV Vanco, cefepime  x 48 hours SLP consulted DuoNebs, supplemental O2 as needed Telemetry  Elevated proBNP Mildly elevated troponin Troponin 33-36, flat trend, denies any left-sided pain, trace pedal edema EKG with no acute ST changes, noted PVCs, old left bundle branch block Echo done 08/2024 showed EF of 40 to 45%, no regional wall motion abnormalities, grade 1 diastolic dysfunction Chest x-ray with no congestion/volume overload Telemetry  Diabetes mellitus type 2 Last A1c 7 on 06/21/2024 SSI, Accu-Cheks, hypoglycemic protocol  Non-small cell lung cancer/neuroendocrine tumor Diagnosed in November 2025 Currently on chemoradiation, followed by Dr. Gatha, added to the treatment team First chemotherapy dose was on December 16/2025, also  undergoing radiation, with last day being on 10/03/2024 Oncology consulted  Goals of care discussion Overall very poor prognosis given above Palliative/hospice consulted, appreciate recs  Estimated body mass index is 23.11 kg/m as calculated from the following:   Height as of this encounter: 5' 9 (1.753 m).   Weight as of this encounter: 71 kg.     Code Status: DNR  Family Communication: Discussed with wife over the phone  Disposition Plan: Status is: Inpatient Remains inpatient appropriate because: Level of care      Consultants: ID Oncology Palliative  Procedures: None  Antimicrobials: Dificid   DVT prophylaxis: SCD due to thrombocytopenia   Objective: Vitals:   09/08/24 1300 09/08/24 1947 09/09/24 0431 09/09/24 1309  BP: 130/72 131/77 (!) 104/56 121/69  Pulse: 78 (!) 52 91 63  Resp:  18 18   Temp: 98 F (36.7 C) 98.2 F (36.8 C) 98.4 F (36.9 C) 97.6 F (36.4 C)  TempSrc: Oral Oral Oral Oral  SpO2: 98% 97% 97% 97%  Weight:      Height:        Intake/Output Summary (Last 24 hours) at 09/09/2024 1614 Last data filed at 09/09/2024 1500 Gross per 24 hour  Intake 4157.63 ml  Output 1400 ml  Net 2757.63 ml   Filed Weights   09/06/24 1247  Weight: 71 kg    Exam: General: Acutely ill-appearing, very deconditioned Cardiovascular: S1, S2 present Respiratory: Diminished breath sounds bilaterally Abdomen: Soft, epigastric tenderness, nondistended, bowel sounds present Musculoskeletal: Trace bilateral pedal edema noted Skin: Normal Psychiatry: Fair mood     Data Reviewed: CBC: Recent Labs  Lab 09/04/24 0858 09/06/24 1253 09/07/24 0700 09/08/24 0644 09/08/24 2204 09/09/24 0620  WBC 0.8* 0.1* 0.2* 0.2* 0.4* 0.7*  NEUTROABS 0.6* 0.0* 0.0* 0.1*  --  0.5*  HGB 12.2* 12.1* 10.9* 10.3* 10.1* 9.7*  HCT 37.0* 37.4* 33.1* 31.5* 30.9* 29.5*  MCV 86.7 88.4 87.1 87.0 87.3 87.5  PLT 111* 46* 31* 22* 19* 19*   Basic Metabolic Panel: Recent  Labs  Lab 09/06/24 1253 09/07/24 0700 09/08/24 0644 09/08/24 0654 09/08/24 2204 09/09/24 0620  NA 134* 134* 136  --  135 137  K 3.7 3.5 3.0*  --  2.9* 3.6  CL 98 98 101  --  104 105  CO2 27 27 25   --  22 21*  GLUCOSE 165* 153* 147*  --  101* 98  BUN 12 13 18   --  17 15  CREATININE 1.03 1.14 1.12  --  0.84 0.84  CALCIUM  8.3* 7.9* 7.7*  --  7.6* 7.5*  MG  --  1.9  --  1.9 1.9  --    GFR: Estimated Creatinine Clearance: 67.8 mL/min (by C-G formula based on SCr of 0.84 mg/dL). Liver Function Tests: Recent Labs  Lab 09/04/24 0858  AST 12*  ALT 9  ALKPHOS 63  BILITOT 0.5  PROT 5.7*  ALBUMIN 3.3*   No results for input(s): LIPASE, AMYLASE in the last 168 hours. No results for input(s): AMMONIA in the last 168 hours. Coagulation Profile: No results for input(s): INR, PROTIME in the last 168 hours. Cardiac Enzymes: No results for input(s): CKTOTAL, CKMB, CKMBINDEX, TROPONINI in the last 168 hours. BNP (last 3 results) Recent Labs    08/15/24 1242 09/06/24 1252  PROBNP 1,200.0* 2,376.0*   HbA1C: No results for input(s): HGBA1C in the last 72 hours. CBG: Recent Labs  Lab 09/08/24 1948 09/09/24 0008 09/09/24 0435 09/09/24 0734 09/09/24 1132  GLUCAP 115* 107* 96 90 149*   Lipid Profile: No results for input(s): CHOL, HDL, LDLCALC, TRIG, CHOLHDL, LDLDIRECT in the last 72 hours. Thyroid  Function Tests: No  results for input(s): TSH, T4TOTAL, FREET4, T3FREE, THYROIDAB in the last 72 hours. Anemia Panel: No results for input(s): VITAMINB12, FOLATE, FERRITIN, TIBC, IRON, RETICCTPCT in the last 72 hours. Urine analysis:    Component Value Date/Time   COLORURINE AMBER (A) 09/07/2024 1638   APPEARANCEUR HAZY (A) 09/07/2024 1638   LABSPEC 1.036 (H) 09/07/2024 1638   PHURINE 5.0 09/07/2024 1638   GLUCOSEU 50 (A) 09/07/2024 1638   HGBUR MODERATE (A) 09/07/2024 1638   BILIRUBINUR NEGATIVE 09/07/2024 1638   KETONESUR 5  (A) 09/07/2024 1638   PROTEINUR 100 (A) 09/07/2024 1638   NITRITE NEGATIVE 09/07/2024 1638   LEUKOCYTESUR NEGATIVE 09/07/2024 1638   Sepsis Labs: @LABRCNTIP (procalcitonin:4,lacticidven:4)  ) Recent Results (from the past 240 hours)  Resp panel by RT-PCR (RSV, Flu A&B, Covid) Anterior Nasal Swab     Status: None   Collection Time: 09/06/24  1:05 PM   Specimen: Anterior Nasal Swab  Result Value Ref Range Status   SARS Coronavirus 2 by RT PCR NEGATIVE NEGATIVE Final    Comment: (NOTE) SARS-CoV-2 target nucleic acids are NOT DETECTED.  The SARS-CoV-2 RNA is generally detectable in upper respiratory specimens during the acute phase of infection. The lowest concentration of SARS-CoV-2 viral copies this assay can detect is 138 copies/mL. A negative result does not preclude SARS-Cov-2 infection and should not be used as the sole basis for treatment or other patient management decisions. A negative result may occur with  improper specimen collection/handling, submission of specimen other than nasopharyngeal swab, presence of viral mutation(s) within the areas targeted by this assay, and inadequate number of viral copies(<138 copies/mL). A negative result must be combined with clinical observations, patient history, and epidemiological information. The expected result is Negative.  Fact Sheet for Patients:  bloggercourse.com  Fact Sheet for Healthcare Providers:  seriousbroker.it  This test is no t yet approved or cleared by the [place]  FDA and  has been authorized for detection and/or diagnosis of SARS-CoV-2 by FDA under an Emergency Use Authorization (EUA). This EUA will remain  in effect (meaning this test can be used) for the duration of the COVID-19 declaration under Section 564(b)(1) of the Act, 21 U.S.C.section 360bbb-3(b)(1), unless the authorization is terminated  or revoked sooner.       Influenza A by PCR NEGATIVE  NEGATIVE Final   Influenza B by PCR NEGATIVE NEGATIVE Final    Comment: (NOTE) The Xpert Xpress SARS-CoV-2/FLU/RSV plus assay is intended as an aid in the diagnosis of influenza from Nasopharyngeal swab specimens and should not be used as a sole basis for treatment. Nasal washings and aspirates are unacceptable for Xpert Xpress SARS-CoV-2/FLU/RSV testing.  Fact Sheet for Patients: bloggercourse.com  Fact Sheet for Healthcare Providers: seriousbroker.it  This test is not yet approved or cleared by the [place]  FDA and has been authorized for detection and/or diagnosis of SARS-CoV-2 by FDA under an Emergency Use Authorization (EUA). This EUA will remain in effect (meaning this test can be used) for the duration of the COVID-19 declaration under Section 564(b)(1) of the Act, 21 U.S.C. section 360bbb-3(b)(1), unless the authorization is terminated or revoked.     Resp Syncytial Virus by PCR NEGATIVE NEGATIVE Final    Comment: (NOTE) Fact Sheet for Patients: bloggercourse.com  Fact Sheet for Healthcare Providers: seriousbroker.it  This test is not yet approved or cleared by the [place]  FDA and has been authorized for detection and/or diagnosis of SARS-CoV-2 by FDA under an Emergency Use Authorization (EUA). This EUA will remain in  effect (meaning this test can be used) for the duration of the COVID-19 declaration under Section 564(b)(1) of the Act, 21 U.S.C. section 360bbb-3(b)(1), unless the authorization is terminated or revoked.  Performed at [facility], [address], [place], [place] [address]   C Difficile Quick Screen w PCR reflex     Status: Abnormal   Collection Time: 09/07/24  6:36 AM   Specimen: STOOL  Result Value Ref Range Status   C Diff antigen POSITIVE (A) NEGATIVE Final   C Diff toxin POSITIVE (A) NEGATIVE Final   C Diff  interpretation Toxin producing C. difficile detected.  Final    Comment: CRITICAL RESULT CALLED TO, READ BACK BY AND VERIFIED WITH: ENOLA PARAS RN AT 9268 ON 09/07/2024 BY PRUDY POUR Performed at [facility], [address] [place], [place] [address]   Gastrointestinal Panel by PCR , Stool     Status: None   Collection Time: 09/07/24  7:34 AM   Specimen: Stool  Result Value Ref Range Status   Campylobacter species NOT DETECTED NOT DETECTED Final   Plesimonas shigelloides NOT DETECTED NOT DETECTED Final   Salmonella species NOT DETECTED NOT DETECTED Final   Yersinia enterocolitica NOT DETECTED NOT DETECTED Final   Vibrio species NOT DETECTED NOT DETECTED Final   Vibrio cholerae NOT DETECTED NOT DETECTED Final   Enteroaggregative E coli (EAEC) NOT DETECTED NOT DETECTED Final   Enteropathogenic E coli (EPEC) NOT DETECTED NOT DETECTED Final   Enterotoxigenic E coli (ETEC) NOT DETECTED NOT DETECTED Final   Shiga like toxin producing E coli (STEC) NOT DETECTED NOT DETECTED Final   Shigella/Enteroinvasive E coli (EIEC) NOT DETECTED NOT DETECTED Final   Cryptosporidium NOT DETECTED NOT DETECTED Final   Cyclospora cayetanensis NOT DETECTED NOT DETECTED Final   Entamoeba histolytica NOT DETECTED NOT DETECTED Final   Giardia lamblia NOT DETECTED NOT DETECTED Final   Adenovirus F40/41 NOT DETECTED NOT DETECTED Final   Astrovirus NOT DETECTED NOT DETECTED Final   Norovirus GI/GII NOT DETECTED NOT DETECTED Final   Rotavirus A NOT DETECTED NOT DETECTED Final   Sapovirus (I, II, IV, and V) NOT DETECTED NOT DETECTED Final    Comment: Performed at [facility], [address] [place], [place] [address]  Culture, blood (Routine X 2) w Reflex to ID Panel     Status: None (Preliminary result)   Collection Time: 09/07/24  9:01 AM   Specimen: BLOOD  Result Value Ref Range Status   Specimen Description   Final    BLOOD BLOOD RIGHT ARM Performed at [facility], [address] [place], [place] [address]    Special Requests   Final    BOTTLES DRAWN AEROBIC AND ANAEROBIC Blood Culture results may not be optimal due to an inadequate volume of blood received in culture bottles Performed at [facility], [address] [place], [place] [address]    Culture   Final    NO GROWTH 2 DAYS Performed at [facility], [address] [place], [place] [address]    Report Status PENDING  Incomplete  Culture, blood (Routine X 2) w Reflex to ID Panel     Status: None (Preliminary result)   Collection Time: 09/07/24  9:05 AM   Specimen: BLOOD  Result Value Ref Range Status   Specimen Description   Final    BLOOD BLOOD RIGHT ARM Performed at [facility], [address], [place], [place] [address]  Special Requests   Final    BOTTLES DRAWN AEROBIC ONLY Blood Culture adequate volume Performed at [facility], [address] [place], [place] [address]    Culture   Final    NO GROWTH 2 DAYS Performed at [facility], [address] [place], [place] [address]    Report Status PENDING  Incomplete  MRSA Next Gen by PCR, Nasal     Status: None   Collection Time: 09/07/24 11:29 AM   Specimen: Nasal Mucosa; Nasal Swab  Result Value Ref Range Status   MRSA by PCR Next Gen NOT DETECTED NOT DETECTED Final    Comment: (NOTE) The GeneXpert MRSA Assay (FDA approved for NASAL specimens only), is one component of a comprehensive MRSA colonization surveillance program. It is not intended to diagnose MRSA infection nor to guide or monitor treatment for MRSA infections. Test performance is not FDA approved in patients less than 68 years old. Performed at [facility], [address], [place], [place] [address]       Studies: DG ESOPHAGUS W SINGLE CM (SOL OR THIN BA) Result Date: 09/09/2024 EXAM: SINGLE CONTRAST ESOPHAGRAM 09/09/2024 12:35:45 PM TECHNIQUE:  Multiple single contrast images of the esophagus and gastroesophageal junction were obtained following the oral administration of water soluble contrast. The patient had difficulty standing on today's exam and was not able to assume various standard positions and turning employed in routine esophagram. The protocol was modified including using LPO images and skipping the pharyngeal phase assessment in order to accommodate his ability. FLUOROSCOPY DOSE AND TYPE: Radiation Dose Index: Reference Air Kerma (in mGy) = COMPARISON: None available. CLINICAL HISTORY: Dysphagia. FINDINGS: On the single contrast images, there is disruption of primary peristaltic waves in the proximal to mid esophagus on 3 out of 3 swallows compatible with esophageal dysmotility. No definite stricture identified. No luminal wall irregularity is appreciated with the understanding that today's examination was single contrast and has some limitations related to patient mobility. The 13 mm barium tablet had transient delay in the small hiatal hernia but with a second swallow of water, passed into the intraabdominal portion of the stomach. A small type 1 hiatal hernia is observed. No evidence of leak. No evidence of achalasia. No gastroesophageal reflux was elicited during examination. IMPRESSION: 1. Nonspecific esophageal dysmotility disorder, with disruption of primary peristaltic waves in the proximal to mid esophagus. 2. Small type 1 hiatal hernia. Electronically signed by: Ryan Salvage MD 09/09/2024 01:05 PM EST RP Workstation: [id number]    Scheduled Meds:  famotidine   20 mg Oral BID   feeding supplement  1 Container Oral TID BM   fiber supplement (BANATROL TF)  60 mL Oral BID   fidaxomicin   200 mg Oral BID   filgrastim  (NIVESTYM ) SQ  300 mcg Subcutaneous q1800   insulin  aspart  0-6 Units Subcutaneous Q4H   pantoprazole   40 mg Oral Daily   sodium chloride  flush  3 mL Intravenous Q12H   sucralfate   1 g Oral TID WC & HS    tamsulosin   0.4 mg Oral QPM    Continuous Infusions:  sodium chloride  100 mL/hr at 09/09/24 0439     LOS: 3 days     Lebron JINNY Cage, MD [facility]  If 7PM-7AM, please contact night-coverage [email or web address] 09/09/2024, 4:14 PM    "

## 2024-09-09 NOTE — Progress Notes (Addendum)
 Edward [name]   DOB:April 16, 1941   FM#:[id number]      ASSESSMENT:  Edward [name] is an 83 year old male patient with oncologic history significant for non-small cell lung cancer, on chemotherapy and RT.  Patient was admitted 09/06/2024 with complaints of chest pain.  He was found to be severely neutropenic and was admitted.  Medical oncology following.   ASSESSMENT & PLAN:  Non-small cell lung cancer, well-differentiated neuroendocrine tumor, stage IIIc (T3, N3, M0) -Diagnosed November 2025 - Started on cycle 1 chemotherapy regimen carboplatin  + etoposide  on 08/27/2024.  Cycle 2 is scheduled for 09/20/2024. - On radiation therapy, okay to resume - Medical oncology/Dr. Gatha following and will make further recommendations.  Pancytopenia: Severe neutropenia - Secondary to recent chemotherapy - WBC is very low 0.7 with ANC 0.5, although shows some improvement from admission. - Given filgrastim  300 mcg 09/06/2024. Continue until ANC >1500. - Afebrile, monitor fever curve - Continue antibiotics as ordered Anemia - Hemoglobin low 9.7. - Recommend PRBC transfusion for hemoglobin <7.0.  No transfusional intervention required at this time Thrombocytopenia - Platelets low 19K. - Recommend platelet transfusion for counts <10K or <50 K with active bleeding - Continue to monitor CBC with differential  C. difficile positive Diarrhea - Positive for C. difficile on 09/07/2024 -Patient admits to ongoing diarrhea today  Chest/epigastric pain - Patient admitted with complaints of chest area pain. - May be secondary to radiation-induced pneumonitis or PNA - Denies any current pain at this time. - Telemetry monitoring is ongoing - Continue PPI as ordered    Code Status DNR-limited  Subjective:  Patient seen awake and alert ambulating with walker and with assistance of PT staff.  Patient reports that he continues to have diarrhea many times.  Denies chest pain or other acute pain.   Looking forward to getting better so he can go home to his granddaughter.  Objective:   Intake/Output Summary (Last 24 hours) at 09/09/2024 1142 Last data filed at 09/09/2024 1057 Gross per 24 hour  Intake 3442.89 ml  Output 1200 ml  Net 2242.89 ml     PHYSICAL EXAMINATION: ECOG PERFORMANCE STATUS: 3 - Symptomatic, >50% confined to bed  Vitals:   09/08/24 1947 09/09/24 0431  BP: 131/77 (!) 104/56  Pulse: (!) 52 91  Resp: 18 18  Temp: 98.2 F (36.8 C) 98.4 F (36.9 C)  SpO2: 97% 97%   Filed Weights   09/06/24 1247  Weight: 156 lb 8.4 oz (71 kg)    GENERAL: alert, + chronically ill-appearing SKIN: skin color, texture, turgor are normal, no rashes or significant lesions EYES: normal, conjunctiva are pink and non-injected, sclera clear OROPHARYNX: no exudate, no erythema and lips, buccal mucosa, and tongue normal  NECK: supple, thyroid  normal size, non-tender, without nodularity LYMPH: no palpable lymphadenopathy in the cervical, axillary or inguinal LUNGS: clear to auscultation and percussion with normal breathing effort HEART: regular rate & rhythm and no murmurs and no lower extremity edema ABDOMEN: abdomen soft, +tender  MUSCULOSKELETAL: + Difficulty ambulating PSYCH: alert & oriented x 3 with fluent speech NEURO: no focal motor/sensory deficits   All questions were answered. The patient knows to call the clinic with any problems, questions or concerns.   I personally spent a total of 40 minutes minutes in the care of the patient today including preparing to see the patient, getting/reviewing separately obtained history, performing a medically appropriate exam/evaluation, counseling and educating, referring and communicating with other health care professionals, documenting clinical information in the EHR, and  coordinating care.    Olam PARAS Rouson, NP 09/09/2024 11:42 AM    Labs Reviewed:  Lab Results  Component Value Date   WBC 0.7 (LL) 09/09/2024   HGB 9.7  (L) 09/09/2024   HCT 29.5 (L) 09/09/2024   MCV 87.5 09/09/2024   PLT 19 (LL) 09/09/2024   Recent Labs    08/15/24 1242 08/15/24 1256 08/22/24 0843 09/04/24 0858 09/06/24 1253 09/08/24 0644 09/08/24 2204 09/09/24 0620  NA 136   < > 140 140   < > 136 135 137  K 4.0   < > 4.2 3.7   < > 3.0* 2.9* 3.6  CL 102   < > 105 105   < > 101 104 105  CO2 23   < > 25 27   < > 25 22 21*  GLUCOSE 168*   < > 173* 146*   < > 147* 101* 98  BUN 19   < > 15 14   < > 18 17 15   CREATININE 1.10   < > 1.08 1.15   < > 1.12 0.84 0.84  CALCIUM  8.8*   < > 8.8* 8.1*   < > 7.7* 7.6* 7.5*  GFRNONAA >60   < > >60 >60   < > >60 >60 >60  PROT 6.0*  --  6.6 5.7*  --   --   --   --   ALBUMIN 2.9*  --  3.3* 3.3*  --   --   --   --   AST 10*  --  21 12*  --   --   --   --   ALT 10  --  23 9  --   --   --   --   ALKPHOS 68  --  78 63  --   --   --   --   BILITOT 0.6  --  0.3 0.5  --   --   --   --    < > = values in this interval not displayed.    Studies Reviewed:   DG Abd Portable 1V Result Date: 09/07/2024 CLINICAL DATA:  Intractable nausea and vomiting. EXAM: PORTABLE ABDOMEN - 1 VIEW COMPARISON:  PET CT 07/22/2024 FINDINGS: No gaseous small bowel distension or evidence of obstruction. Slight increased air within transverse colon. Only minimal formed stool in the colon. Right renal stone on prior PET CT is not well demonstrated on the current exam. Excreted IV contrast in the urinary bladder. Degenerative change of both hips. IMPRESSION: No bowel obstruction. Air throughout the colon can be seen with diarrheal process. Electronically Signed   By: Andrea Gasman M.D.   On: 09/07/2024 17:04   CT Angio Chest PE W and/or Wo Contrast Result Date: 09/06/2024 EXAM: CTA CHEST 09/06/2024 02:14:40 PM TECHNIQUE: CTA of the chest was performed without and with the administration of 75 mL of iohexol  (OMNIPAQUE ) 350 MG/ML injection. Multiplanar reformatted images are provided for review. MIP images are provided for review.  Automated exposure control, iterative reconstruction, and/or weight based adjustment of the mA/kV was utilized to reduce the radiation dose to as low as reasonably achievable. COMPARISON: 08/15/2024 CLINICAL HISTORY: Pulmonary embolism (PE) suspected, low to intermediate prob, positive D-dimer. FINDINGS: PULMONARY ARTERIES: Pulmonary arteries are adequately opacified for evaluation. No acute pulmonary embolus. Main pulmonary artery is normal in caliber. Similar narrowing of the right middle and right lower lobe pulmonary arteries due to the infrahilar mass. MEDIASTINUM: Similar cardiomegaly. LAD atherosclerosis. There is  no acute abnormality of the thoracic aorta. LYMPH NODES: Unchanged left paratracheal lymph node measuring approximately 1.3 cm in short axis dimension. No hilar or axillary lymphadenopathy. LUNGS AND PLEURA: Overall, similar appearance of the lobular right infrahilar mass, measuring 7.2 x 6.2 cm. The airspace consolidation in the right lower lobe has slightly decreased in size in the interim. The pleural effusion is similar in size to the prior exam. Right middle lobe bronchus is occluded with similar consolidation in the right middle lobe. Posterior dependent atelectasis in the left lower lobe. No pneumothorax. UPPER ABDOMEN: No acute abnormality within the partially visualized upper abdomen. SOFT TISSUES AND BONES: Multilevel degenerative disc disease of the spine. No acute soft tissue abnormality. IMPRESSION: 1. No evidence of pulmonary embolism. 2. Lobular right infrahilar mass measuring 7.2 x 6.2 cm, similar to prior study, causing narrowing of the right middle and right lower lobe pulmonary arteries. 3. Slightly decreased airspace consolidation in the right lower lobe compared to prior study with otherwise similar size of the right pleural effusion. Similar appearance of the right middle lobe airspace consolidation. Electronically signed by: Rogelia Myers MD 09/06/2024 02:46 PM EST RP  Workstation: [id number]   DG Chest Portable 1 View Result Date: 09/06/2024 CLINICAL DATA:  Chest pain EXAM: PORTABLE CHEST 1 VIEW COMPARISON:  August 14, 2024 FINDINGS: Stable cardiomegaly. Stable right basilar opacity concerning for pneumonia or atelectasis with possible small right pleural effusion. Bony thorax is unremarkable. IMPRESSION: Stable right basilar opacity as described above. Electronically Signed   By: Lynwood Landy Raddle M.D.   On: 09/06/2024 13:58   ECHOCARDIOGRAM COMPLETE Result Date: 08/17/2024    ECHOCARDIOGRAM REPORT   Patient Name:   Edward [name] Date of Exam: 08/17/2024 Medical Rec #:  [id number]         Height:       69.0 in Accession #:    [id number]        Weight:       157.2 lb Date of Birth:  [birth year]         BSA:          1.865 m Patient Age:    83 years          BP:           110/70 mmHg Patient Gender: M                 HR:           79 bpm. Exam Location:  Inpatient Procedure: 2D Echo, Intracardiac Opacification Agent, Color Doppler and Cardiac            Doppler (Both Spectral and Color Flow Doppler were utilized during            procedure). Indications:    Dyspnea R06.00  History:        Patient has no prior history of Echocardiogram examinations.                 Risk Factors:Diabetes, Hypertension and Dyslipidemia.  Sonographer:    Tinnie Gosling RDCS Referring Phys: [id number] DANIEL P GOODRICH IMPRESSIONS  1. Left ventricular ejection fraction, by estimation, is 40 to 45%. The left ventricle has mildly decreased function. The left ventricle has no regional wall motion abnormalities. There is mild left ventricular hypertrophy. Left ventricular diastolic parameters are consistent with Grade I diastolic dysfunction (impaired relaxation).  2. Right ventricular systolic function is normal. The right ventricular size is normal.  3. Left atrial size was moderately  dilated.  4. A small pericardial effusion is present. The pericardial effusion is anterior to the right ventricle and  localized near the right ventricle.  5. The mitral valve is abnormal. Trivial mitral valve regurgitation. No evidence of mitral stenosis.  6. The aortic valve is tricuspid. There is mild calcification of the aortic valve. There is mild thickening of the aortic valve. Aortic valve regurgitation is not visualized. Aortic valve sclerosis is present, with no evidence of aortic valve stenosis.  7. The inferior vena cava is dilated in size with >50% respiratory variability, suggesting right atrial pressure of 8 mmHg. FINDINGS  Left Ventricle: Abnormal septal motion and ventricular ectopy during study. Left ventricular ejection fraction, by estimation, is 40 to 45%. The left ventricle has mildly decreased function. The left ventricle has no regional wall motion abnormalities. Definity  contrast agent was given IV to delineate the left ventricular endocardial borders. Strain was performed and the global longitudinal strain is indeterminate. The left ventricular internal cavity size was normal in size. There is mild left ventricular hypertrophy. Left ventricular diastolic parameters are consistent with Grade I diastolic dysfunction (impaired relaxation). Right Ventricle: The right ventricular size is normal. No increase in right ventricular wall thickness. Right ventricular systolic function is normal. Left Atrium: Left atrial size was moderately dilated. Right Atrium: Right atrial size was normal in size. Pericardium: A small pericardial effusion is present. The pericardial effusion is anterior to the right ventricle and localized near the right ventricle. Mitral Valve: The mitral valve is abnormal. There is mild thickening of the mitral valve leaflet(s). Trivial mitral valve regurgitation. No evidence of mitral valve stenosis. Tricuspid Valve: The tricuspid valve is normal in structure. Tricuspid valve regurgitation is mild . No evidence of tricuspid stenosis. Aortic Valve: The aortic valve is tricuspid. There is mild  calcification of the aortic valve. There is mild thickening of the aortic valve. Aortic valve regurgitation is not visualized. Aortic valve sclerosis is present, with no evidence of aortic valve stenosis. Pulmonic Valve: The pulmonic valve was normal in structure. Pulmonic valve regurgitation is trivial. No evidence of pulmonic stenosis. Aorta: The aortic root is normal in size and structure. Venous: The inferior vena cava is dilated in size with greater than 50% respiratory variability, suggesting right atrial pressure of 8 mmHg. IAS/Shunts: No atrial level shunt detected by color flow Doppler. Additional Comments: 3D was performed not requiring image post processing on an independent workstation and was indeterminate.  LEFT VENTRICLE PLAX 2D LVIDd:         5.60 cm      Diastology LVIDs:         4.30 cm      LV e' medial:    4.90 cm/s LV PW:         1.20 cm      LV E/e' medial:  10.7 LV IVS:        1.30 cm      LV e' lateral:   7.29 cm/s LVOT diam:     2.30 cm      LV E/e' lateral: 7.2 LV SV:         64 LV SV Index:   34 LVOT Area:     4.15 cm  LV Volumes (MOD) LV vol d, MOD A2C: 133.0 ml LV vol d, MOD A4C: 114.0 ml LV vol s, MOD A2C: 81.1 ml LV vol s, MOD A4C: 80.3 ml LV SV MOD A2C:     51.9 ml LV SV MOD A4C:  114.0 ml LV SV MOD BP:      47.7 ml RIGHT VENTRICLE             IVC RV S prime:     17.60 cm/s  IVC diam: 2.70 cm LEFT ATRIUM             Index        RIGHT ATRIUM           Index LA diam:        4.30 cm 2.31 cm/m   RA Area:     12.90 cm LA Vol (A2C):   56.6 ml 30.35 ml/m  RA Volume:   29.50 ml  15.82 ml/m LA Vol (A4C):   43.6 ml 23.38 ml/m LA Biplane Vol: 50.7 ml 27.19 ml/m  AORTIC VALVE LVOT Vmax:   74.60 cm/s LVOT Vmean:  49.000 cm/s LVOT VTI:    0.153 m  AORTA Ao Root diam: 3.60 cm Ao Asc diam:  3.70 cm MITRAL VALVE               TRICUSPID VALVE MV Area (PHT): 2.86 cm    TR Peak grad:   24.4 mmHg MV E velocity: 52.30 cm/s  TR Vmax:        247.00 cm/s MV A velocity: 76.30 cm/s MV E/A ratio:   0.69        SHUNTS                            Systemic VTI:  0.15 m                            Systemic Diam: 2.30 cm Maude Emmer MD Electronically signed by Maude Emmer MD Signature Date/Time: 08/17/2024/11:25:20 AM    Final    DG Chest 2 View Result Date: 08/15/2024 CLINICAL DATA:  post bronch. rule out pneumothorax EXAM: CHEST - 2 VIEW COMPARISON:  July 30, 2024 FINDINGS: Increasing airspace consolidation in the right lower lobe. Small pleural effusion on the right. No pneumothorax. The right infrahilar lung mass is less conspicuous on today's radiograph due to the airspace disease in the right lung base. Mild cardiomegaly. Aortic atherosclerosis. No acute fracture or destructive lesions. Multilevel thoracic osteophytosis. IMPRESSION: Increasing airspace consolidation in the right lower lobe, possibly worsening atelectasis or airspace disease given the patient's right infrahilar lung mass. Small right pleural effusion. Electronically Signed   By: Rogelia Myers M.D.   On: 08/15/2024 15:29   CT Angio Chest PE W and/or Wo Contrast Result Date: 08/15/2024 CLINICAL DATA:  Pulmonary embolism (PE) suspected, low to intermediate prob, positive D-dimer EXAM: CT ANGIOGRAPHY CHEST WITH CONTRAST TECHNIQUE: Multidetector CT imaging of the chest was performed using the standard protocol during bolus administration of intravenous contrast. Multiplanar CT image reconstructions and MIPs were obtained to evaluate the vascular anatomy. RADIATION DOSE REDUCTION: This exam was performed according to the departmental dose-optimization program which includes automated exposure control, adjustment of the mA and/or kV according to patient size and/or use of iterative reconstruction technique. CONTRAST:  75mL OMNIPAQUE  IOHEXOL  350 MG/ML SOLN COMPARISON:  08/14/2024, 07/22/2024, 07/02/2024 FINDINGS: Pulmonary Embolism: No pulmonary embolism. Cardiovascular: Mild cardiomegaly. Moderate volume pericardial effusion.No aortic  aneurysm. Mediastinum/Nodes: No mediastinal mass.Subcarinal lymph node measuring 1.4 cm (axial 76). Unchanged enlarged lower left paratracheal lymph node. Lungs/Pleura: The midline trachea and bronchi are patent. Redemonstration of a right infrahilar mass, which measures 6 x  7.7 x 6.4 cm (previously, 5.7 x 6.3 x 5.6 cm). The mass is again noted to completely obstruct the right middle lobe bronchus with severe to near complete narrowing of the right lower lobe bronchi. The mass also significantly narrows the right interlobar pulmonary artery in the right middle lobe pulmonary artery with complete occlusion of the superior right pulmonary vein. Interval development of dense airspace consolidation filling the majority of the right lower lobe with unchanged, but significant atelectasis of the right middle lobe. No pneumothorax. Small right pleural effusion. Musculoskeletal: No acute fracture. Similar lucent lesion in the right scapula measuring 1.3 cm, which was not FDG avid on the prior PET-CT. Multilevel degenerative disc disease of the spine. Thoracic DISH. Upper Abdomen: No acute abnormality in the partially visualized upper abdomen. Reflux of contrast into the hepatic veins. Review of the MIP images confirms the above findings. IMPRESSION: 1. No pulmonary embolism. 2. Enlarging right infrahilar mass, as measured above. This causes complete obstruction of the right middle lobe bronchus with severe to near-complete narrowing of the right lower lobe bronchi. New dense airspace consolidation filling the majority of the right lower lobe. While this may represent right lower lobe atelectasis, it would be worrisome for postobstructive pneumonia, in the correct clinical context. Small right pleural effusion. 3. Reflux of contrast into the hepatic veins is suggestive of underlying cardiac dysfunction. 4. Redemonstrated multistation mediastinal lymph nodes, with a more prominent subcarinal lymph node, concerning for  worsening metastatic disease. Aortic Atherosclerosis (ICD10-I70.0). Electronically Signed   By: Rogelia Myers M.D.   On: 08/15/2024 15:26   VAS US  LOWER EXTREMITY VENOUS (DVT) (ONLY [facility] & [facility]) Result Date: 08/15/2024  Lower Venous DVT Study Patient Name:  Edward [name]  Date of Exam:   08/15/2024 Medical Rec #: [id number]          Accession #:    [id number] Date of Birth: Aug 09, 1941          Patient Gender: M Patient Age:   70 years Exam Location:  [facility] Procedure:      VAS US  LOWER EXTREMITY VENOUS (DVT) Referring Phys: MAUDE MESSICK --------------------------------------------------------------------------------  Indications: SOB. Other Indications: History of lung cancer. Risk Factors: COPD. Comparison Study: No priors. Performing Technologist: Ricka Sturdivant-Jones RDMS, RVT  Examination Guidelines: A complete evaluation includes B-mode imaging, spectral Doppler, color Doppler, and power Doppler as needed of all accessible portions of each vessel. Bilateral testing is considered an integral part of a complete examination. Limited examinations for reoccurring indications may be performed as noted. The reflux portion of the exam is performed with the patient in reverse Trendelenburg.  +---------+---------------+---------+-----------+----------+--------------+ RIGHT    CompressibilityPhasicitySpontaneityPropertiesThrombus Aging +---------+---------------+---------+-----------+----------+--------------+ CFV      Full           Yes      Yes                                 +---------+---------------+---------+-----------+----------+--------------+ SFJ      Full                                                        +---------+---------------+---------+-----------+----------+--------------+ FV Prox  Full                                                        +---------+---------------+---------+-----------+----------+--------------+  FV Mid   Full           Yes      Yes                                  +---------+---------------+---------+-----------+----------+--------------+ FV DistalFull                                                        +---------+---------------+---------+-----------+----------+--------------+ PFV      Full                                                        +---------+---------------+---------+-----------+----------+--------------+ POP      Full           Yes      Yes                                 +---------+---------------+---------+-----------+----------+--------------+ PTV      Full                                                        +---------+---------------+---------+-----------+----------+--------------+ PERO     Full                                                        +---------+---------------+---------+-----------+----------+--------------+   +---------+---------------+---------+-----------+----------+--------------+ LEFT     CompressibilityPhasicitySpontaneityPropertiesThrombus Aging +---------+---------------+---------+-----------+----------+--------------+ CFV      Full           Yes      Yes                                 +---------+---------------+---------+-----------+----------+--------------+ SFJ      Full                                                        +---------+---------------+---------+-----------+----------+--------------+ FV Prox  Full                                                        +---------+---------------+---------+-----------+----------+--------------+ FV Mid   Full           No       Yes                                 +---------+---------------+---------+-----------+----------+--------------+  FV DistalFull                                                        +---------+---------------+---------+-----------+----------+--------------+ PFV      Full                                                         +---------+---------------+---------+-----------+----------+--------------+ POP      Full           Yes      Yes                                 +---------+---------------+---------+-----------+----------+--------------+ PTV      Full                                                        +---------+---------------+---------+-----------+----------+--------------+ PERO     Full                                                        +---------+---------------+---------+-----------+----------+--------------+     Summary: BILATERAL: - No evidence of deep vein thrombosis seen in the lower extremities, bilaterally. -No evidence of popliteal cyst, bilaterally.   *See table(s) above for measurements and observations. Electronically signed by Debby Robertson on 08/15/2024 at 2:26:10 PM.    Final     ADDENDUM: Hematology/Oncology Attending: I had a face-to-face encounter with the patient today.  I agree with the above note.  This is a very pleasant 83 years old white male with stage IIIc well-differentiated neuroendocrine carcinoma and highly suspicious for atypical carcinoid presented with right suprahilar mass with occlusion of the right middle lobe and constriction of the right lower lobe bronchus diagnosed in November 2025.  The patient started concurrent radiotherapy with chemotherapy in the form of carboplatin  and etoposide  status post 1 cycle. He has been tolerating his radiation well but after the first cycle of the chemotherapy the patient has significant pancytopenia.  He was admitted with chest pain in addition to the pancytopenia.  He has been on treatment with filgrastim  on outpatient basis and continued on admission.  He also had shortness of breath and swollen ankle.  He was feeling so tired and fatigued. The patient is feeling a little bit better today and his total white blood count is slowly improving with the G-CSF injections. I recommended for the patient to continue with the  current supportive care for now. He may resume his radiotherapy tomorrow if no other contraindication. We will arrange for the patient a follow-up appointment at the cancer center after discharge for reevaluation before resuming his systemic therapy. Thank you for taking good care of Ms. Dillenburg, will continue to follow-up the patient with you and assist in his management on as-needed basis.  Disclaimer: This note was dictated with voice recognition software. Similar sounding words can inadvertently be transcribed and may be missed upon review. Sherrod MARLA Sherrod, MD

## 2024-09-09 NOTE — Progress Notes (Signed)
 Initial Nutrition Assessment  INTERVENTION:   -Boost Breeze po TID, each supplement provides 250 kcal and 9 grams of protein   -Banatrol fiber supplement BID, each provides 45 kcals, 2g protein and 5g soluble fiber to aid diarrhea   NUTRITION DIAGNOSIS:   Increased nutrient needs related to cancer and cancer related treatments as evidenced by estimated needs.  GOAL:   Patient will meet greater than or equal to 90% of their needs  MONITOR:   PO intake, Supplement acceptance  REASON FOR ASSESSMENT:   Consult Assessment of nutrition requirement/status  ASSESSMENT:   83 year old male patient with oncologic history significant for non-small cell lung cancer, on chemotherapy and RT.  Patient was admitted 09/06/2024 with complaints of chest pain.  He was found to be severely neutropenic and was admitted.  Unable to speak with patient at times of visit, pt with therapy and other providers. Will attempt to gather history at later time. Per chart review, pt received chemotherapy 12/22 and has since not eaten anything. Pt now with c.diff. Has had poor taste as well. Pt was refusing Ensure supplements as they make him sick. Switched to [employer] to trial and will also order Banatrol to aid diarrhea.   Per weight records, pt has lost 10 lbs since 10/10 (6% wt loss x 2.5 months, insignificant for time frame).  Medications: Pepcid , Carafate , KCl  Labs reviewed: CBGs: 90-149   NUTRITION - FOCUSED PHYSICAL EXAM:  Unable to complete  Diet Order:   Diet Order             Diet clear liquid Room service appropriate? Yes; Fluid consistency: Thin  Diet effective now                   EDUCATION NEEDS:   Not appropriate for education at this time  Skin:  Skin Assessment: Reviewed RN Assessment  Last BM:  12/28  Height:   Ht Readings from Last 1 Encounters:  09/06/24 5' 9 (1.753 m)    Weight:   Wt Readings from Last 1 Encounters:  09/06/24 71 kg    BMI:  Body  mass index is 23.11 kg/m.  Estimated Nutritional Needs:   Kcal:  2100-2300  Protein:  105-115g  Fluid:  2.1L/day   Morna Lee, MS, RD, LDN Inpatient Clinical Dietitian Contact via Secure chat

## 2024-09-09 NOTE — Telephone Encounter (Signed)
 Patient Product/process Development Scientist completed.    The patient is insured through [employer]/ [employer]. Patient has Medicare and is not eligible for a copay card, but may be able to apply for patient assistance or Medicare RX Payment Plan (Patient Must reach out to their plan, if eligible for payment plan), if available.    Ran test claim for Dificid  200 mg and the current 10 day co-pay is $81.72.   This test claim was processed through [facility]- copay amounts may vary at other pharmacies due to pharmacy/plan contracts, or as the patient moves through the different stages of their insurance plan.     Reyes Sharps, CPHT Pharmacy Technician Patient Advocate Specialist Lead [facility] Patient Advocate Team Direct Number: [phone]  Fax: [phone]

## 2024-09-09 NOTE — Plan of Care (Signed)
" °  Problem: Education: Goal: Knowledge of General Education information will improve Description: Including pain rating scale, medication(s)/side effects and non-pharmacologic comfort measures Outcome: Progressing   Problem: Clinical Measurements: Goal: Ability to maintain clinical measurements within normal limits will improve Outcome: Progressing Goal: Diagnostic test results will improve Outcome: Progressing Goal: Respiratory complications will improve Outcome: Progressing Goal: Cardiovascular complication will be avoided Outcome: Progressing   Problem: Activity: Goal: Risk for activity intolerance will decrease Outcome: Progressing   Problem: Nutrition: Goal: Adequate nutrition will be maintained Outcome: Progressing   Problem: Coping: Goal: Level of anxiety will decrease Outcome: Progressing   Problem: Elimination: Goal: Will not experience complications related to bowel motility Outcome: Progressing Goal: Will not experience complications related to urinary retention Outcome: Progressing   Problem: Pain Managment: Goal: General experience of comfort will improve and/or be controlled Outcome: Progressing   Problem: Safety: Goal: Ability to remain free from injury will improve Outcome: Progressing   Problem: Skin Integrity: Goal: Risk for impaired skin integrity will decrease Outcome: Progressing   Problem: Fluid Volume: Goal: Ability to maintain a balanced intake and output will improve Outcome: Progressing   Problem: Metabolic: Goal: Ability to maintain appropriate glucose levels will improve Outcome: Progressing   Problem: Nutritional: Goal: Maintenance of adequate nutrition will improve Outcome: Progressing   Problem: Skin Integrity: Goal: Risk for impaired skin integrity will decrease Outcome: Progressing   Problem: Tissue Perfusion: Goal: Adequacy of tissue perfusion will improve Outcome: Progressing   "

## 2024-09-09 NOTE — Progress Notes (Addendum)
 "  Palliative Medicine Inpatient Follow Up Note HPI: Edward [name] is a 83 y.o. 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 Palliative care has been asked to support additional goals of care conversations.   Today's Discussion 09/09/2024  *Please note that this is a verbal dictation therefore any spelling or grammatical errors are due to the Dragon Medical One system interpretation.  I reviewed the chart notes including nursing notes from today, progress notes from today. I also reviewed vital signs, nursing flowsheets, medication administrations record, labs, and imaging.    I met with Edward [name] and his wife, Edward [name] at bedside this afternoon. Edward [name] shares that he feels improved today. His bowel movement frequency is slowing down and he feels he has more energy. He shares that he went to get an EGD today which was less than optimal. He states the clear liquid diet is not appetizing to him and he is hopeful for dietary advancement. I encouraged his wife to bring in more satiable foods from the outside.   Edward [name] asked if patient will be seen by a cardiologist. She shares concerns about patients afib. I shared that I will bring these concerns to the attention of the primary team.  Edward [name] asks when patient can resume radiation and will this hospitalization alter his chemotherapy. She shares wanting to speak to the Oncology team(s) to gain better insights on what to expect moving forward. I shared that I would reach out to them as well.   Reviewed a MOST form with Edward [name] and completed as below:  Cardiopulmonary  Resuscitation: Do Not Attempt Resuscitation (DNR/No CPR)  Medical Interventions: Limited Additional Interventions: Use medical treatment, IV fluids and cardiac monitoring as indicated, DO NOT USE intubation or mechanical ventilation. May consider use of less invasive airway support such as BiPAP or CPAP. Also provide comfort measures. Transfer to the hospital if indicated. Avoid intensive care.   Antibiotics: Antibiotics if indicated  IV Fluids: IV fluids if indicated  Feeding Tube: No feeding tube   Created space and opportunity for patient to explore thoughts feelings and fears regarding current medical situation. He shares he has a strong desire to improve and will eat more and mobilize more in the oncoming days.   Questions and concerns addressed/Palliative Support Provided.   Objective Assessment: Vital Signs Vitals:   09/09/24 0431 09/09/24 1309  BP: (!) 104/56 121/69  Pulse: 91 63  Resp: 18   Temp: 98.4 F (36.9 C) 97.6 F (36.4 C)  SpO2: 97% 97%    Intake/Output Summary (Last 24 hours) at 09/09/2024 1334 Last data filed at 09/09/2024 1057 Gross per 24 hour  Intake 3442.89 ml  Output 1200 ml  Net 2242.89 ml   Last Weight  Most recent update: 09/06/2024 12:47 PM    Weight  71 kg (156 lb 8.4 oz)            Gen:  Elderly Caucasian M chronically ill appearing HEENT: moist mucous membranes CV: Regular rate and rhythm  PULM:On RA, breathing is even and nonlabored   ABD: soft/nontender  EXT: No edema  Neuro: Alert and oriented x2-3  SUMMARY OF RECOMMENDATIONS   DNAR/DNI   Allow time for  outcomes   Patients goals remain for chemotherapy and radiation treatments to see if he may gain some independence back thereafter   Appreciate Dr. Gatha updating patients family per their request  Appreciate Primary team talking to patients wife regarding concerns associated with AFib   OP Palliative Oncology symptom clinic referral   Ongoing PMT support   Code  Status/Advance Care Planning: DNAR/DNI   Symptom Management:  Reflux: - Continue Protonix  daily - Upright for meals 30 minutes after ingestion - Low acid meals   Altered taste: - Nutrition Consult - Bland foods   Diarrhea d/t C.Diff: - Per primary team   Weakness: - PT/OT - Daily mobility   Emotional Support: - Therapeutic Listening   Advanced Directives: - Patients wife plans to bring a copy ______________________________________________________________________________________ Rosaline Becton [facility] Palliative Medicine Team Team Cell Phone: [phone] Please utilize secure chat with additional questions, if there is no response within 30 minutes please call the above phone number  Time Spent: 39   Palliative Medicine Team providers are available by phone from 7am to 7pm daily and can be reached through the team cell phone.  Should this patient require assistance outside of these hours, please call the patient's attending physician.     "

## 2024-09-10 ENCOUNTER — Inpatient Hospital Stay (HOSPITAL_COMMUNITY)

## 2024-09-10 ENCOUNTER — Ambulatory Visit

## 2024-09-10 DIAGNOSIS — Z515 Encounter for palliative care: Secondary | ICD-10-CM | POA: Diagnosis not present

## 2024-09-10 DIAGNOSIS — C3411 Malignant neoplasm of upper lobe, right bronchus or lung: Secondary | ICD-10-CM | POA: Diagnosis not present

## 2024-09-10 DIAGNOSIS — Z7189 Other specified counseling: Secondary | ICD-10-CM | POA: Diagnosis not present

## 2024-09-10 LAB — GLUCOSE, CAPILLARY
Glucose-Capillary: 114 mg/dL — ABNORMAL HIGH (ref 70–99)
Glucose-Capillary: 112 mg/dL — ABNORMAL HIGH (ref 70–99)
Glucose-Capillary: 132 mg/dL — ABNORMAL HIGH (ref 70–99)
Glucose-Capillary: 172 mg/dL — ABNORMAL HIGH (ref 70–99)
Glucose-Capillary: 185 mg/dL — ABNORMAL HIGH (ref 70–99)
Glucose-Capillary: 156 mg/dL — ABNORMAL HIGH (ref 70–99)

## 2024-09-10 LAB — CBC WITH DIFFERENTIAL/PLATELET
Abs Immature Granulocytes: 0.1 K/uL — ABNORMAL HIGH (ref 0.00–0.07)
Basophils Absolute: 0 K/uL (ref 0.0–0.1)
Basophils Relative: 1 %
Eosinophils Absolute: 0 K/uL (ref 0.0–0.5)
Eosinophils Relative: 1 %
HCT: 28.5 % — ABNORMAL LOW (ref 39.0–52.0)
Hemoglobin: 9.5 g/dL — ABNORMAL LOW (ref 13.0–17.0)
Immature Granulocytes: 4 %
Lymphocytes Relative: 5 %
Lymphs Abs: 0.1 K/uL — ABNORMAL LOW (ref 0.7–4.0)
MCH: 29 pg (ref 26.0–34.0)
MCHC: 33.3 g/dL (ref 30.0–36.0)
MCV: 86.9 fL (ref 80.0–100.0)
Monocytes Absolute: 0.1 K/uL (ref 0.1–1.0)
Monocytes Relative: 6 %
Neutro Abs: 1.9 K/uL (ref 1.7–7.7)
Neutrophils Relative %: 83 %
Platelets: 24 K/uL — CL (ref 150–400)
RBC: 3.28 MIL/uL — ABNORMAL LOW (ref 4.22–5.81)
RDW: 13.4 % (ref 11.5–15.5)
WBC: 2.3 K/uL — ABNORMAL LOW (ref 4.0–10.5)
nRBC: 0 % (ref 0.0–0.2)

## 2024-09-10 LAB — BASIC METABOLIC PANEL WITH GFR
Anion gap: 7 (ref 5–15)
BUN: 15 mg/dL (ref 8–23)
CO2: 22 mmol/L (ref 22–32)
Calcium: 7.3 mg/dL — ABNORMAL LOW (ref 8.9–10.3)
Chloride: 108 mmol/L (ref 98–111)
Creatinine, Ser: 0.75 mg/dL (ref 0.61–1.24)
GFR, Estimated: >60 mL/min
Glucose, Bld: 119 mg/dL — ABNORMAL HIGH (ref 70–99)
Potassium: 2.9 mmol/L — ABNORMAL LOW (ref 3.5–5.1)
Sodium: 138 mmol/L (ref 135–145)

## 2024-09-10 LAB — MAGNESIUM: Magnesium: 2.1 mg/dL (ref 1.7–2.4)

## 2024-09-10 MED ORDER — POTASSIUM CHLORIDE 10 MEQ/100ML IV SOLN
10.0000 meq | INTRAVENOUS | Status: AC
  Administered 2024-09-10 (×3): 10 meq via INTRAVENOUS
  Filled 2024-09-10 (×2): qty 100

## 2024-09-10 MED ORDER — METOPROLOL TARTRATE 25 MG PO TABS
12.5000 mg | ORAL_TABLET | Freq: Two times a day (BID) | ORAL | Status: DC
  Filled 2024-09-10: qty 1

## 2024-09-10 MED ORDER — FUROSEMIDE 10 MG/ML IJ SOLN
20.0000 mg | Freq: Once | INTRAMUSCULAR | Status: AC
  Administered 2024-09-10: 20 mg via INTRAVENOUS
  Filled 2024-09-10: qty 2

## 2024-09-10 MED ORDER — POTASSIUM CHLORIDE CRYS ER 20 MEQ PO TBCR
40.0000 meq | EXTENDED_RELEASE_TABLET | Freq: Once | ORAL | Status: AC
  Administered 2024-09-10: 40 meq via ORAL
  Filled 2024-09-10: qty 2

## 2024-09-10 MED ORDER — POTASSIUM CHLORIDE 10 MEQ/100ML IV SOLN
10.0000 meq | INTRAVENOUS | Status: DC
  Administered 2024-09-10: 10 meq via INTRAVENOUS
  Filled 2024-09-10 (×2): qty 100

## 2024-09-10 NOTE — Progress Notes (Signed)
 "  Palliative Medicine Inpatient Follow Up Note HPI: Edward [name] is a 83 y.o. 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 Palliative care has been asked to support additional goals of care conversations.   Today's Discussion 09/10/2024  *Please note that this is a verbal dictation therefore any spelling or grammatical errors are due to the Dragon Medical One system interpretation.  I reviewed the chart notes including nursing notes from today, progress notes from today. I also reviewed vital signs, nursing flowsheets, medication administrations record, labs - WBC is improving, and imaging.    I met with Cassandra at bedside this morning. He is awake and alert. He shares that his diarrhea has slowed down and he thinks he has gone a number of hours without a bowel movement. He states that he does feel his bowel frequency is improving. He denies pain or shortness of breath during my time at bedside.   Discussed the plan moving forward regarding long term goals, he maintains the hope to continue to improve and get home to spend time with his family. He shares the desire to improve from the perspective of his mobility. He does have an appetite but does not favor the hospital food. Have encouraged food(s) be brought in by his family.   Questions and concerns addressed/Palliative Support Provided.   Objective Assessment: Vital Signs Vitals:   09/10/24 0429 09/10/24 0811  BP: 122/61 126/63  Pulse: 81 (!) 51  Resp: 20   Temp: 98.2 F (36.8 C)   SpO2: 94% 95%    Intake/Output Summary (Last 24 hours) at  09/10/2024 9075 Last data filed at 09/09/2024 2113 Gross per 24 hour  Intake 832.74 ml  Output 550 ml  Net 282.74 ml   Last Weight  Most recent update: 09/06/2024 12:47 PM    Weight  71 kg (156 lb 8.4 oz)            Gen:  Elderly Caucasian M chronically ill appearing HEENT: moist mucous membranes CV: Regular rate and rhythm  PULM:On RA, breathing is even and nonlabored   ABD: soft/nontender  EXT: No edema  Neuro: Alert and oriented x2-3  SUMMARY OF RECOMMENDATIONS   DNAR/DNI   Allow time for outcomes   Patients goals remain for chemotherapy and radiation treatments to see if he may gain some independence back thereafter    OP Palliative Oncology symptom clinic referral   Ongoing PMT support   Code Status/Advance Care Planning: DNAR/DNI   Symptom Management:  Reflux: - Famotidine  & Carafate  per primary - Protonix  stopped - Upright for meals 30 minutes after ingestion - Low acid meals   Altered taste: - Nutrition Consult - Bland foods   Diarrhea d/t C.Diff: - Per primary team   Weakness: - PT/OT - Daily mobility   Emotional Support: - Therapeutic Listening   Advanced Directives: - Patients wife plans to bring a copy ______________________________________________________________________________________ Rosaline Becton [facility] Palliative Medicine Team Team Cell Phone: [phone] Please utilize secure chat with additional questions, if there is no response within 30 minutes please call the above phone number  Time Spent: 15  Palliative Medicine Team providers are available by phone from 7am to  7pm daily and can be reached through the team cell phone.  Should this patient require assistance outside of these hours, please call the patient's attending physician.     "

## 2024-09-10 NOTE — Plan of Care (Signed)

## 2024-09-10 NOTE — Progress Notes (Signed)
 "  PROGRESS NOTE  Edward [name] FMW:[id number] DOB: 1940-12-18 DOA: 09/06/2024 PCP: [name], NP-C  HPI/Recap of past 24 hours: Edward [name] is a 83 y.o. 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  Patient's first chemotherapy dose was on December 16/2025, also undergoing radiation, with last day being on 10/03/2024.  Patient was recently seen at the cancer center on 12/24, noted to be neutropenic secondary to his chemo and received G-CSF injection. On 12/26 presented back to the cancer center for his second dose of G-CSF, due to his persistent symptoms, was sent to the ED for further management.  In the ED, vital signs fairly stable, labs show pancytopenia including severe neutropenia. CTA of his chest revealed right infahilar mass, unchanged right pleural effusion and right middle lobe consolidation, slightly decreased right lower lobe airspace disease all compared to his last CT. patient was started on IV antibiotics and admitted for further management.     Today, patient noted to be SOB 2/2 aggressive IV fluids given.  Reports diarrhea has improved, denies any further nausea/vomiting.  Reports slight improvement in epigastric pain.    Assessment/Plan: Principal Problem:   Neutropenic Active Problems:   Obstructive pneumonia   Sepsis (HCC)   C. difficile colitis   Malignant neoplasm of lung (HCC)   C. difficile colitis with underlying neutropenia Nausea/vomiting Currently afebrile, with resolving neutropenia BC x 2 NGTD, MRSA PCR negative Stool sample positive for C. difficile, GIP  panel negative Lactic acid WNL DG abdomen no SBO, or megacolon ID on board, continue p.o. Dificid  Discontinued systemic IV antibiotics after 48 hours given C. difficile infection Diet advanced Neutropenic and enteric precaution Telemetry, monitor closely  Epigastric pain Possible esophageal dysphagia/radiation-induced Reports burning in nature Esophagram showed nonspecific esophageal dysmotility disorder SLP consulted, appreciate recs Continue famotidine  IV (hold PPI in the setting of C. Difficile), Carafate  suspension Outpatient follow-up with GI  Pancytopenia Likely 2/2 chemotherapy, with improved neutropenia S/p filgrastim , with improvement in ANC Daily CBC, neutropenic precautions Oncology consulted Daily CBC  Hypokalemia Replace as needed  Acute hypoxic respiratory failure Possible acute on chronic systolic and diastolic HF/volume overload Likely from aggressive fluid resuscitation Requiring about 2 L of O2 to maintain sats in the 90s, visibly SOB ProBNP elevated on admission Initial CXR, no congestion, repeat chest x-ray pending Echo done 08/2024 showed EF of 40 to 45%, no regional wall motion abnormalities, grade 1 diastolic dysfunction Gentle diuresis, as BP can tolerate, gave 1st dose of IV Lasix  on 12/30 Strict I's and O's Telemetry, monitor closely  Possible postobstructive pneumonia/radiation induced pneumonitis ??Aspiration pneumonia Pro-Cal 0.73 CTA of his chest revealed right infahilar mass, unchanged right pleural effusion and right middle lobe consolidation, slightly decreased right lower lobe airspace disease all compared to his last CT Discontinue IV Vanco, cefepime  x 48 hours SLP consulted DuoNebs, supplemental O2 as needed Telemetry  Mildly elevated troponin Multiple PVCs Troponin 33-36, flat trend, denies any left-sided pain, trace pedal edema EKG with no acute ST changes, noted PVCs, old left bundle branch block Follows with cardiology/EP,  recommended on previous admission metoprolol  12.5 mg twice daily, currently on hold due to soft BP/intermittent bradycardia, may resume  pending vitals due to noted PVCs Telemetry  Diabetes mellitus type 2 Last A1c 7 on 06/21/2024 SSI, Accu-Cheks, hypoglycemic protocol  Non-small cell lung cancer/neuroendocrine tumor Diagnosed in November 2025 Currently on chemoradiation, followed by Dr. Gatha, added to the treatment team First chemotherapy dose was on December 16/2025, also undergoing radiation, with last day being on 10/03/2024 Oncology consulted  Goals of care discussion Overall very poor prognosis given above Palliative/hospice consulted, appreciate recs    Estimated body mass index is 23.11 kg/m as calculated from the following:   Height as of this encounter: 5' 9 (1.753 m).   Weight as of this encounter: 71 kg.     Code Status: DNR  Family Communication: Discussed with wife over the phone  Disposition Plan: Status is: Inpatient Remains inpatient appropriate because: Level of care      Consultants: ID Oncology Palliative  Procedures: None  Antimicrobials: Dificid   DVT prophylaxis: SCD due to thrombocytopenia   Objective: Vitals:   09/10/24 0429 09/10/24 0811 09/10/24 1019 09/10/24 1340  BP: 122/61 126/63 (!) 123/57 129/63  Pulse: 81 (!) 51 (!) 43 100  Resp: 20   20  Temp: 98.2 F (36.8 C)   98.7 F (37.1 C)  TempSrc: Oral   Oral  SpO2: 94% 95%  96%  Weight:      Height:        Intake/Output Summary (Last 24 hours) at 09/10/2024 1357 Last data filed at 09/10/2024 1236 Gross per 24 hour  Intake 714.74 ml  Output 2100 ml  Net -1385.26 ml   Filed Weights   09/06/24 1247  Weight: 71 kg    Exam: General: Acutely ill-appearing, very deconditioned Cardiovascular: S1, S2 present Respiratory: Diminished breath sounds bilaterally Abdomen: Soft, epigastric tenderness, nondistended, bowel sounds present Musculoskeletal: No bilateral pedal  edema noted Skin: Normal Psychiatry: Fair mood     Data Reviewed: CBC: Recent Labs  Lab 09/06/24 1253 09/07/24 0700 09/08/24 0644 09/08/24 2204 09/09/24 0620 09/10/24 0700  WBC 0.1* 0.2* 0.2* 0.4* 0.7* 2.3*  NEUTROABS 0.0* 0.0* 0.1*  --  0.5* 1.9  HGB 12.1* 10.9* 10.3* 10.1* 9.7* 9.5*  HCT 37.4* 33.1* 31.5* 30.9* 29.5* 28.5*  MCV 88.4 87.1 87.0 87.3 87.5 86.9  PLT 46* 31* 22* 19* 19* 24*   Basic Metabolic Panel: Recent Labs  Lab 09/07/24 0700 09/08/24 0644 09/08/24 0654 09/08/24 2204 09/09/24 0620 09/10/24 0700  NA 134* 136  --  135 137 138  K 3.5 3.0*  --  2.9* 3.6 2.9*  CL 98 101  --  104 105 108  CO2 27 25  --  22 21* 22  GLUCOSE 153* 147*  --  101* 98 119*  BUN 13 18  --  17 15 15   CREATININE 1.14 1.12  --  0.84 0.84 0.75  CALCIUM  7.9* 7.7*  --  7.6* 7.5* 7.3*  MG 1.9  --  1.9 1.9  --  2.1   GFR: Estimated Creatinine Clearance: 71.2 mL/min (by C-G formula based on SCr of 0.75 mg/dL). Liver Function Tests: Recent Labs  Lab 09/04/24 0858  AST 12*  ALT 9  ALKPHOS 63  BILITOT 0.5  PROT 5.7*  ALBUMIN 3.3*   No results for input(s): LIPASE, AMYLASE in the last 168 hours. No results for input(s): AMMONIA in the last 168 hours. Coagulation Profile: No results for input(s): INR, PROTIME in the last 168 hours. Cardiac Enzymes: No results for input(s): CKTOTAL, CKMB, CKMBINDEX, TROPONINI in the last 168 hours. BNP (last 3  results) Recent Labs    08/15/24 1242 09/06/24 1252  PROBNP 1,200.0* 2,376.0*   HbA1C: No results for input(s): HGBA1C in the last 72 hours. CBG: Recent Labs  Lab 09/09/24 2112 09/09/24 2339 09/10/24 0334 09/10/24 0722 09/10/24 1152  GLUCAP 156* 123* 114* 112* 132*   Lipid Profile: No results for input(s): CHOL, HDL, LDLCALC, TRIG, CHOLHDL, LDLDIRECT in the last 72 hours. Thyroid  Function Tests: No results for input(s): TSH, T4TOTAL, FREET4, T3FREE, THYROIDAB in the last 72  hours. Anemia Panel: No results for input(s): VITAMINB12, FOLATE, FERRITIN, TIBC, IRON, RETICCTPCT in the last 72 hours. Urine analysis:    Component Value Date/Time   COLORURINE AMBER (A) 09/07/2024 1638   APPEARANCEUR HAZY (A) 09/07/2024 1638   LABSPEC 1.036 (H) 09/07/2024 1638   PHURINE 5.0 09/07/2024 1638   GLUCOSEU 50 (A) 09/07/2024 1638   HGBUR MODERATE (A) 09/07/2024 1638   BILIRUBINUR NEGATIVE 09/07/2024 1638   KETONESUR 5 (A) 09/07/2024 1638   PROTEINUR 100 (A) 09/07/2024 1638   NITRITE NEGATIVE 09/07/2024 1638   LEUKOCYTESUR NEGATIVE 09/07/2024 1638   Sepsis Labs: @LABRCNTIP (procalcitonin:4,lacticidven:4)  ) Recent Results (from the past 240 hours)  Resp panel by RT-PCR (RSV, Flu A&B, Covid) Anterior Nasal Swab     Status: None   Collection Time: 09/06/24  1:05 PM   Specimen: Anterior Nasal Swab  Result Value Ref Range Status   SARS Coronavirus 2 by RT PCR NEGATIVE NEGATIVE Final    Comment: (NOTE) SARS-CoV-2 target nucleic acids are NOT DETECTED.  The SARS-CoV-2 RNA is generally detectable in upper respiratory specimens during the acute phase of infection. The lowest concentration of SARS-CoV-2 viral copies this assay can detect is 138 copies/mL. A negative result does not preclude SARS-Cov-2 infection and should not be used as the sole basis for treatment or other patient management decisions. A negative result may occur with  improper specimen collection/handling, submission of specimen other than nasopharyngeal swab, presence of viral mutation(s) within the areas targeted by this assay, and inadequate number of viral copies(<138 copies/mL). A negative result must be combined with clinical observations, patient history, and epidemiological information. The expected result is Negative.  Fact Sheet for Patients:  bloggercourse.com  Fact Sheet for Healthcare Providers:  seriousbroker.it  This test  is no t yet approved or cleared by the [place]  FDA and  has been authorized for detection and/or diagnosis of SARS-CoV-2 by FDA under an Emergency Use Authorization (EUA). This EUA will remain  in effect (meaning this test can be used) for the duration of the COVID-19 declaration under Section 564(b)(1) of the Act, 21 U.S.C.section 360bbb-3(b)(1), unless the authorization is terminated  or revoked sooner.       Influenza A by PCR NEGATIVE NEGATIVE Final   Influenza B by PCR NEGATIVE NEGATIVE Final    Comment: (NOTE) The Xpert Xpress SARS-CoV-2/FLU/RSV plus assay is intended as an aid in the diagnosis of influenza from Nasopharyngeal swab specimens and should not be used as a sole basis for treatment. Nasal washings and aspirates are unacceptable for Xpert Xpress SARS-CoV-2/FLU/RSV testing.  Fact Sheet for Patients: bloggercourse.com  Fact Sheet for Healthcare Providers: seriousbroker.it  This test is not yet approved or cleared by the [place]  FDA and has been authorized for detection and/or diagnosis of SARS-CoV-2 by FDA under an Emergency Use Authorization (EUA). This EUA will remain in effect (meaning this test can be used) for the duration of the COVID-19 declaration under Section 564(b)(1) of the Act, 21 U.S.C. section 360bbb-3(b)(1), unless  the authorization is terminated or revoked.     Resp Syncytial Virus by PCR NEGATIVE NEGATIVE Final    Comment: (NOTE) Fact Sheet for Patients: bloggercourse.com  Fact Sheet for Healthcare Providers: seriousbroker.it  This test is not yet approved or cleared by the [place]  FDA and has been authorized for detection and/or diagnosis of SARS-CoV-2 by FDA under an Emergency Use Authorization (EUA). This EUA will remain in effect (meaning this test can be used) for the duration of the COVID-19 declaration under Section  564(b)(1) of the Act, 21 U.S.C. section 360bbb-3(b)(1), unless the authorization is terminated or revoked.  Performed at [facility], [address] [place], [place] [address]   C Difficile Quick Screen w PCR reflex     Status: Abnormal   Collection Time: 09/07/24  6:36 AM   Specimen: STOOL  Result Value Ref Range Status   C Diff antigen POSITIVE (A) NEGATIVE Final   C Diff toxin POSITIVE (A) NEGATIVE Final   C Diff interpretation Toxin producing C. difficile detected.  Final    Comment: CRITICAL RESULT CALLED TO, READ BACK BY AND VERIFIED WITH: ENOLA PARAS RN AT 9268 ON 09/07/2024 BY PRUDY POUR Performed at [facility], [address] [place], [place] [address]   Gastrointestinal Panel by PCR , Stool     Status: None   Collection Time: 09/07/24  7:34 AM   Specimen: Stool  Result Value Ref Range Status   Campylobacter species NOT DETECTED NOT DETECTED Final   Plesimonas shigelloides NOT DETECTED NOT DETECTED Final   Salmonella species NOT DETECTED NOT DETECTED Final   Yersinia enterocolitica NOT DETECTED NOT DETECTED Final   Vibrio species NOT DETECTED NOT DETECTED Final   Vibrio cholerae NOT DETECTED NOT DETECTED Final   Enteroaggregative E coli (EAEC) NOT DETECTED NOT DETECTED Final   Enteropathogenic E coli (EPEC) NOT DETECTED NOT DETECTED Final   Enterotoxigenic E coli (ETEC) NOT DETECTED NOT DETECTED Final   Shiga like toxin producing E coli (STEC) NOT DETECTED NOT DETECTED Final   Shigella/Enteroinvasive E coli (EIEC) NOT DETECTED NOT DETECTED Final   Cryptosporidium NOT DETECTED NOT DETECTED Final   Cyclospora cayetanensis NOT DETECTED NOT DETECTED Final   Entamoeba histolytica NOT DETECTED NOT DETECTED Final   Giardia lamblia NOT DETECTED NOT DETECTED Final   Adenovirus F40/41 NOT DETECTED NOT DETECTED Final   Astrovirus NOT DETECTED NOT DETECTED Final   Norovirus GI/GII NOT DETECTED NOT DETECTED Final   Rotavirus A NOT DETECTED  NOT DETECTED Final   Sapovirus (I, II, IV, and V) NOT DETECTED NOT DETECTED Final    Comment: Performed at [facility], [address] [place], [place] [address]  Culture, blood (Routine X 2) w Reflex to ID Panel     Status: None (Preliminary result)   Collection Time: 09/07/24  9:01 AM   Specimen: BLOOD  Result Value Ref Range Status   Specimen Description   Final    BLOOD BLOOD RIGHT ARM Performed at [facility], [address], [place], [place] [address]    Special Requests   Final    BOTTLES DRAWN AEROBIC AND ANAEROBIC Blood Culture results may not be optimal due to an inadequate volume of blood received in culture bottles Performed at [facility], [address], [place], [place] [address]    Culture   Final    NO GROWTH 3 DAYS Performed at [facility], [address], [place], [place] [address]  Report Status PENDING  Incomplete  Culture, blood (Routine X 2) w Reflex to ID Panel     Status: None (Preliminary result)   Collection Time: 09/07/24  9:05 AM   Specimen: BLOOD  Result Value Ref Range Status   Specimen Description   Final    BLOOD BLOOD RIGHT ARM Performed at [facility], [address], [place], [place] [address]    Special Requests   Final    BOTTLES DRAWN AEROBIC ONLY Blood Culture adequate volume Performed at [facility], [address], [place], [place] [address]    Culture   Final    NO GROWTH 3 DAYS Performed at [facility], [address] [place], [place] [address]    Report Status PENDING  Incomplete  MRSA Next Gen by PCR, Nasal     Status: None   Collection Time: 09/07/24 11:29 AM   Specimen: Nasal Mucosa; Nasal Swab  Result Value Ref Range Status   MRSA by PCR Next Gen NOT DETECTED NOT DETECTED Final    Comment: (NOTE) The GeneXpert MRSA Assay (FDA approved for NASAL specimens only), is one component of a comprehensive MRSA colonization  surveillance program. It is not intended to diagnose MRSA infection nor to guide or monitor treatment for MRSA infections. Test performance is not FDA approved in patients less than 42 years old. Performed at [facility], [address], [place], [place] [address]       Studies: No results found.   Scheduled Meds:  famotidine   20 mg Oral BID   feeding supplement  1 Container Oral TID BM   fiber supplement (BANATROL TF)  60 mL Oral BID   fidaxomicin   200 mg Oral BID   insulin  aspart  0-6 Units Subcutaneous Q4H   sodium chloride  flush  3 mL Intravenous Q12H   sucralfate   1 g Oral TID WC & HS   tamsulosin   0.4 mg Oral QPM    Continuous Infusions:     LOS: 4 days     Edward JINNY Cage, MD [facility]  If 7PM-7AM, please contact night-coverage [email or web address] 09/10/2024, 1:57 PM    "

## 2024-09-11 ENCOUNTER — Other Ambulatory Visit: Payer: Self-pay

## 2024-09-11 ENCOUNTER — Other Ambulatory Visit (HOSPITAL_COMMUNITY): Payer: Self-pay

## 2024-09-11 ENCOUNTER — Inpatient Hospital Stay

## 2024-09-11 ENCOUNTER — Ambulatory Visit

## 2024-09-11 DIAGNOSIS — T451X5A Adverse effect of antineoplastic and immunosuppressive drugs, initial encounter: Secondary | ICD-10-CM | POA: Diagnosis not present

## 2024-09-11 DIAGNOSIS — Z515 Encounter for palliative care: Secondary | ICD-10-CM | POA: Diagnosis not present

## 2024-09-11 DIAGNOSIS — D709 Neutropenia, unspecified: Secondary | ICD-10-CM

## 2024-09-11 DIAGNOSIS — D701 Agranulocytosis secondary to cancer chemotherapy: Secondary | ICD-10-CM | POA: Diagnosis not present

## 2024-09-11 DIAGNOSIS — Z7189 Other specified counseling: Secondary | ICD-10-CM | POA: Diagnosis not present

## 2024-09-11 LAB — CBC WITH DIFFERENTIAL/PLATELET
Abs Immature Granulocytes: 0.03 K/uL (ref 0.00–0.07)
Basophils Absolute: 0 K/uL (ref 0.0–0.1)
Basophils Relative: 1 %
Eosinophils Absolute: 0 K/uL (ref 0.0–0.5)
Eosinophils Relative: 1 %
HCT: 31.1 % — ABNORMAL LOW (ref 39.0–52.0)
Hemoglobin: 10.3 g/dL — ABNORMAL LOW (ref 13.0–17.0)
Immature Granulocytes: 2 %
Lymphocytes Relative: 8 %
Lymphs Abs: 0.1 K/uL — ABNORMAL LOW (ref 0.7–4.0)
MCH: 28.9 pg (ref 26.0–34.0)
MCHC: 33.1 g/dL (ref 30.0–36.0)
MCV: 87.1 fL (ref 80.0–100.0)
Monocytes Absolute: 0.1 K/uL (ref 0.1–1.0)
Monocytes Relative: 4 %
Neutro Abs: 1.6 K/uL — ABNORMAL LOW (ref 1.7–7.7)
Neutrophils Relative %: 84 %
Platelets: 33 K/uL — ABNORMAL LOW (ref 150–400)
RBC: 3.57 MIL/uL — ABNORMAL LOW (ref 4.22–5.81)
RDW: 13.4 % (ref 11.5–15.5)
WBC: 1.8 K/uL — ABNORMAL LOW (ref 4.0–10.5)
nRBC: 0 % (ref 0.0–0.2)

## 2024-09-11 LAB — RAD ONC ARIA SESSION SUMMARY
Course Elapsed Days: 26
Plan Fractions Treated to Date: 16
Plan Prescribed Dose Per Fraction: 2 Gy
Plan Total Fractions Prescribed: 30
Plan Total Prescribed Dose: 60 Gy
Reference Point Dosage Given to Date: 32 Gy
Reference Point Session Dosage Given: 2 Gy
Session Number: 16

## 2024-09-11 LAB — BASIC METABOLIC PANEL WITH GFR
Anion gap: 6 (ref 5–15)
BUN: 11 mg/dL (ref 8–23)
CO2: 27 mmol/L (ref 22–32)
Calcium: 7.3 mg/dL — ABNORMAL LOW (ref 8.9–10.3)
Chloride: 102 mmol/L (ref 98–111)
Creatinine, Ser: 0.85 mg/dL (ref 0.61–1.24)
GFR, Estimated: >60 mL/min
Glucose, Bld: 137 mg/dL — ABNORMAL HIGH (ref 70–99)
Potassium: 2.9 mmol/L — ABNORMAL LOW (ref 3.5–5.1)
Sodium: 136 mmol/L (ref 135–145)

## 2024-09-11 LAB — GLUCOSE, CAPILLARY
Glucose-Capillary: 115 mg/dL — ABNORMAL HIGH (ref 70–99)
Glucose-Capillary: 128 mg/dL — ABNORMAL HIGH (ref 70–99)
Glucose-Capillary: 129 mg/dL — ABNORMAL HIGH (ref 70–99)
Glucose-Capillary: 136 mg/dL — ABNORMAL HIGH (ref 70–99)
Glucose-Capillary: 156 mg/dL — ABNORMAL HIGH (ref 70–99)
Glucose-Capillary: 164 mg/dL — ABNORMAL HIGH (ref 70–99)
Glucose-Capillary: 196 mg/dL — ABNORMAL HIGH (ref 70–99)

## 2024-09-11 MED ORDER — POTASSIUM CHLORIDE CRYS ER 20 MEQ PO TBCR
40.0000 meq | EXTENDED_RELEASE_TABLET | ORAL | Status: AC
  Administered 2024-09-11 (×3): 40 meq via ORAL
  Filled 2024-09-11 (×3): qty 2

## 2024-09-11 NOTE — Progress Notes (Signed)
 " PROGRESS NOTE    Edward [name]  FMW:[id number] DOB: 1941-06-17 DOA: 09/06/2024 PCP: [name], NP-C  Brief Narrative:  83 y.o. 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  Patient's first chemotherapy dose was on December 16/2025, also undergoing radiation, with last day being on 10/03/2024.   Patient was recently seen at the cancer center on 12/24, noted to be neutropenic secondary to his chemo and received G-CSF injection.  On 12/26 presented back to the cancer center for his second dose of G-CSF, due to his persistent symptoms, was sent to the ED for further management.   In the ED, vital signs fairly stable, labs show pancytopenia including severe neutropenia. CTA of his chest revealed right infahilar mass, unchanged right pleural effusion and right middle lobe consolidation, slightly decreased right lower lobe airspace disease all compared to his last CT.  patient was started on IV antibiotics and admitted for further management. 09/11/2024 patient denies having any diarrhea however he has very poor appetite has not been eating or drinking at all.  He feels no taste in his mouth.  Denies nausea vomiting.   Assessment & Plan:   Principal Problem:   Neutropenic Active Problems:   Obstructive pneumonia   Sepsis (HCC)   C. difficile colitis   Malignant neoplasm of lung (HCC)  C. difficile colitis with underlying neutropenia Nausea/vomiting Currently afebrile, with resolving neutropenia BC x 2 NGTD, MRSA PCR negative Stool sample positive for C. Difficile positive antigen and positive toxin with  diarrhea on admission  GIP panel negative Lactic acid WNL DG abdomen no SBO, or megacolon ID on board, signed off 09/09/2024 continue p.o. Dificid  last dose September 17, 2018 6 repeat Discontinued systemic IV antibiotics after 48 hours given C. difficile infection Diet advanced Neutropenic and enteric precaution Telemetry, monitor closely   Epigastric pain Possible esophageal dysphagia/radiation-induced Reports burning in nature Esophagram showed nonspecific esophageal dysmotility disorder SLP consulted, appreciate recs Continue famotidine  IV (hold PPI in the setting of C. Difficile), Carafate  suspension Outpatient follow-up with GI   Pancytopenia Likely 2/2 chemotherapy, with improved neutropenia S/p filgrastim , with improvement in ANC Daily CBC, neutropenic precautions Oncology consulted Daily CBC   Hypokalemia latest potassium 2.9 Replace   Acute hypoxic respiratory failure Possible acute on chronic systolic and diastolic HF/volume overload Likely from aggressive fluid resuscitation Requiring about 2 L of O2 to maintain sats in the 90s, visibly SOB ProBNP elevated on admission Initial CXR, no congestion, repeat chest x-ray 09/10/2024 interstitial pulmonary edema mild increase in the size of a small to moderate right pleural effusion Echo done 08/2024 showed EF of 40 to 45%, no regional wall motion abnormalities, grade 1 diastolic dysfunction He was given Lasix  first dose on 30 December Pressure on the soft side 107/69 Remains on 2 L of oxygen to maintain saturation above 92% Will monitor closely and reassess daily the need for diuresis.  Possible postobstructive pneumonia/radiation induced pneumonitis ??Aspiration pneumonia Pro-Cal 0.73 CTA of his chest revealed right infahilar mass, unchanged right pleural effusion and right middle lobe consolidation, slightly decreased right lower lobe airspace disease all compared to his last CT Discontinue IV Vanco, cefepime  x 48  hours SLP consulted  DuoNebs, supplemental O2 as needed   Mildly elevated troponin Multiple PVCs Troponin 33-36, flat trend, denies any left-sided pain, trace pedal edema EKG with no acute ST changes, noted PVCs, old left bundle branch block Follows with cardiology/EP, recommended on previous admission metoprolol  12.5 mg twice daily, currently on hold due to soft BP/intermittent bradycardia, may resume pending vitals due to noted PVCs Telemetry   Diabetes mellitus type 2 Last A1c 7 on 06/21/2024 SSI, Accu-Cheks, hypoglycemic protocol   Non-small cell lung cancer/neuroendocrine tumor Diagnosed in November 2025 Currently on chemoradiation, followed by Dr. Gatha, added to the treatment team First chemotherapy dose was on December 16/2025, also undergoing radiation, with last day being on 10/03/2024 Oncology consulted   Goals of care discussion Overall very poor prognosis given above Palliative/hospice consulted, appreciate recs     Nutrition Problem: Increased nutrient needs Etiology: cancer and cancer related treatments     Signs/Symptoms: estimated needs    Interventions: Boost Breeze, Refer to RD note for recommendations  Estimated body mass index is 23.11 kg/m as calculated from the following:   Height as of this encounter: 5' 9 (1.753 m).   Weight as of this encounter: 71 kg.  DVT prophylaxis:  scd Code Status: dnr Family Communication:none Disposition Plan:  Status is: Inpatient Remains inpatient appropriate because: Acute illness   Consultants:  ID oncology and palliative  Procedures: None Antimicrobials: Dificid   Subjective: Resting in bed reports he does not have an appetite he has not been eating or drinking well  Objective: Vitals:   09/10/24 0811 09/10/24 1019 09/10/24 1340 09/10/24 2019  BP: 126/63 (!) 123/57 129/63 107/69  Pulse: (!) 51 (!) 43 100 82  Resp:   20 20  Temp:   98.7 F (37.1 C) (!) 97.5 F (36.4 C)  TempSrc:   Oral Oral   SpO2: 95%  96% 96%  Weight:      Height:        Intake/Output Summary (Last 24 hours) at 09/11/2024 9078 Last data filed at 09/10/2024 2208 Gross per 24 hour  Intake --  Output 3300 ml  Net -3300 ml   Filed Weights   09/06/24 1247  Weight: 71 kg    Examination:  General exam: Appears frail and chronically ill-appearing Respiratory system: Clear to auscultation. Respiratory effort normal. Cardiovascular system:reg Gastrointestinal system: soft nt Central nervous system: Alert and oriented.  Extremities: no edema  Data Reviewed: I have personally reviewed following labs and imaging studies  CBC: Recent Labs  Lab 09/07/24 0700 09/08/24 0644 09/08/24 2204 09/09/24 0620 09/10/24 0700 09/11/24 0500  WBC 0.2* 0.2* 0.4* 0.7* 2.3* 1.8*  NEUTROABS 0.0* 0.1*  --  0.5* 1.9 PENDING  HGB 10.9* 10.3* 10.1* 9.7* 9.5* 10.3*  HCT 33.1* 31.5* 30.9* 29.5* 28.5* 31.1*  MCV 87.1 87.0 87.3 87.5 86.9 87.1  PLT 31* 22* 19* 19* 24* 33*   Basic Metabolic Panel: Recent Labs  Lab 09/07/24 0700 09/08/24 0644 09/08/24 0654 09/08/24 2204 09/09/24 0620 09/10/24 0700 09/11/24 0500  NA 134* 136  --  135 137 138 136  K 3.5 3.0*  --  2.9* 3.6 2.9* 2.9*  CL 98 101  --  104 105 108 102  CO2 27 25  --  22 21* 22 27  GLUCOSE 153* 147*  --  101* 98 119* 137*  BUN 13 18  --  17 15 15 11   CREATININE 1.14 1.12  --  0.84 0.84 0.75 0.85  CALCIUM  7.9* 7.7*  --  7.6* 7.5*  7.3* 7.3*  MG 1.9  --  1.9 1.9  --  2.1  --    GFR: Estimated Creatinine Clearance: 67 mL/min (by C-G formula based on SCr of 0.85 mg/dL). Liver Function Tests: No results for input(s): AST, ALT, ALKPHOS, BILITOT, PROT, ALBUMIN in the last 168 hours. No results for input(s): LIPASE, AMYLASE in the last 168 hours. No results for input(s): AMMONIA in the last 168 hours. Coagulation Profile: No results for input(s): INR, PROTIME in the last 168 hours. Cardiac Enzymes: No results for input(s): CKTOTAL,  CKMB, CKMBINDEX, TROPONINI in the last 168 hours. BNP (last 3 results) Recent Labs    08/15/24 1242 09/06/24 1252  PROBNP 1,200.0* 2,376.0*   HbA1C: No results for input(s): HGBA1C in the last 72 hours. CBG: Recent Labs  Lab 09/10/24 2011 09/10/24 2359 09/11/24 0030 09/11/24 0419 09/11/24 0743  GLUCAP 156* 129* 128* 115* 136*   Lipid Profile: No results for input(s): CHOL, HDL, LDLCALC, TRIG, CHOLHDL, LDLDIRECT in the last 72 hours. Thyroid  Function Tests: No results for input(s): TSH, T4TOTAL, FREET4, T3FREE, THYROIDAB in the last 72 hours. Anemia Panel: No results for input(s): VITAMINB12, FOLATE, FERRITIN, TIBC, IRON, RETICCTPCT in the last 72 hours. Sepsis Labs: Recent Labs  Lab 09/07/24 1635  PROCALCITON 0.73  LATICACIDVEN 1.2    Recent Results (from the past 240 hours)  Resp panel by RT-PCR (RSV, Flu A&B, Covid) Anterior Nasal Swab     Status: None   Collection Time: 09/06/24  1:05 PM   Specimen: Anterior Nasal Swab  Result Value Ref Range Status   SARS Coronavirus 2 by RT PCR NEGATIVE NEGATIVE Final    Comment: (NOTE) SARS-CoV-2 target nucleic acids are NOT DETECTED.  The SARS-CoV-2 RNA is generally detectable in upper respiratory specimens during the acute phase of infection. The lowest concentration of SARS-CoV-2 viral copies this assay can detect is 138 copies/mL. A negative result does not preclude SARS-Cov-2 infection and should not be used as the sole basis for treatment or other patient management decisions. A negative result may occur with  improper specimen collection/handling, submission of specimen other than nasopharyngeal swab, presence of viral mutation(s) within the areas targeted by this assay, and inadequate number of viral copies(<138 copies/mL). A negative result must be combined with clinical observations, patient history, and epidemiological information. The expected result is Negative.  Fact  Sheet for Patients:  bloggercourse.com  Fact Sheet for Healthcare Providers:  seriousbroker.it  This test is no t yet approved or cleared by the [place]  FDA and  has been authorized for detection and/or diagnosis of SARS-CoV-2 by FDA under an Emergency Use Authorization (EUA). This EUA will remain  in effect (meaning this test can be used) for the duration of the COVID-19 declaration under Section 564(b)(1) of the Act, 21 U.S.C.section 360bbb-3(b)(1), unless the authorization is terminated  or revoked sooner.       Influenza A by PCR NEGATIVE NEGATIVE Final   Influenza B by PCR NEGATIVE NEGATIVE Final    Comment: (NOTE) The Xpert Xpress SARS-CoV-2/FLU/RSV plus assay is intended as an aid in the diagnosis of influenza from Nasopharyngeal swab specimens and should not be used as a sole basis for treatment. Nasal washings and aspirates are unacceptable for Xpert Xpress SARS-CoV-2/FLU/RSV testing.  Fact Sheet for Patients: bloggercourse.com  Fact Sheet for Healthcare Providers: seriousbroker.it  This test is not yet approved or cleared by the [place]  FDA and has been authorized for detection and/or diagnosis of SARS-CoV-2 by FDA under an Emergency  Use Authorization (EUA). This EUA will remain in effect (meaning this test can be used) for the duration of the COVID-19 declaration under Section 564(b)(1) of the Act, 21 U.S.C. section 360bbb-3(b)(1), unless the authorization is terminated or revoked.     Resp Syncytial Virus by PCR NEGATIVE NEGATIVE Final    Comment: (NOTE) Fact Sheet for Patients: bloggercourse.com  Fact Sheet for Healthcare Providers: seriousbroker.it  This test is not yet approved or cleared by the [place]  FDA and has been authorized for detection and/or diagnosis of SARS-CoV-2 by FDA under an  Emergency Use Authorization (EUA). This EUA will remain in effect (meaning this test can be used) for the duration of the COVID-19 declaration under Section 564(b)(1) of the Act, 21 U.S.C. section 360bbb-3(b)(1), unless the authorization is terminated or revoked.  Performed at [facility], [address], [place], [place] [address]   C Difficile Quick Screen w PCR reflex     Status: Abnormal   Collection Time: 09/07/24  6:36 AM   Specimen: STOOL  Result Value Ref Range Status   C Diff antigen POSITIVE (A) NEGATIVE Final   C Diff toxin POSITIVE (A) NEGATIVE Final   C Diff interpretation Toxin producing C. difficile detected.  Final    Comment: CRITICAL RESULT CALLED TO, READ BACK BY AND VERIFIED WITH: ENOLA PARAS RN AT 9268 ON 09/07/2024 BY PRUDY POUR Performed at [facility], [address], [place], [place] [address]   Gastrointestinal Panel by PCR , Stool     Status: None   Collection Time: 09/07/24  7:34 AM   Specimen: Stool  Result Value Ref Range Status   Campylobacter species NOT DETECTED NOT DETECTED Final   Plesimonas shigelloides NOT DETECTED NOT DETECTED Final   Salmonella species NOT DETECTED NOT DETECTED Final   Yersinia enterocolitica NOT DETECTED NOT DETECTED Final   Vibrio species NOT DETECTED NOT DETECTED Final   Vibrio cholerae NOT DETECTED NOT DETECTED Final   Enteroaggregative E coli (EAEC) NOT DETECTED NOT DETECTED Final   Enteropathogenic E coli (EPEC) NOT DETECTED NOT DETECTED Final   Enterotoxigenic E coli (ETEC) NOT DETECTED NOT DETECTED Final   Shiga like toxin producing E coli (STEC) NOT DETECTED NOT DETECTED Final   Shigella/Enteroinvasive E coli (EIEC) NOT DETECTED NOT DETECTED Final   Cryptosporidium NOT DETECTED NOT DETECTED Final   Cyclospora cayetanensis NOT DETECTED NOT DETECTED Final   Entamoeba histolytica NOT DETECTED NOT DETECTED Final   Giardia lamblia NOT DETECTED NOT DETECTED Final   Adenovirus F40/41  NOT DETECTED NOT DETECTED Final   Astrovirus NOT DETECTED NOT DETECTED Final   Norovirus GI/GII NOT DETECTED NOT DETECTED Final   Rotavirus A NOT DETECTED NOT DETECTED Final   Sapovirus (I, II, IV, and V) NOT DETECTED NOT DETECTED Final    Comment: Performed at [facility], [address] [place], [place] [address]  Culture, blood (Routine X 2) w Reflex to ID Panel     Status: None (Preliminary result)   Collection Time: 09/07/24  9:01 AM   Specimen: BLOOD  Result Value Ref Range Status   Specimen Description   Final    BLOOD BLOOD RIGHT ARM Performed at [facility], [address], [place], [place] [address]    Special Requests   Final    BOTTLES DRAWN AEROBIC AND ANAEROBIC Blood Culture results may not be optimal due to an inadequate volume of blood received in culture bottles Performed at [facility], [address] [place], [place] [address]    Culture   Final    NO GROWTH 4 DAYS Performed at [facility], [address], [place], [place] [address]    Report Status PENDING  Incomplete  Culture, blood (Routine X 2) w Reflex to ID Panel     Status: None (Preliminary result)   Collection Time: 09/07/24  9:05 AM   Specimen: BLOOD  Result Value Ref Range Status   Specimen Description   Final    BLOOD BLOOD RIGHT ARM Performed at [facility], [address], [place], [place] [address]    Special Requests   Final    BOTTLES DRAWN AEROBIC ONLY Blood Culture adequate volume Performed at [facility], [address], [place], [place] [address]    Culture   Final    NO GROWTH 4 DAYS Performed at [facility], [address], [place], [place] [address]    Report Status PENDING  Incomplete  MRSA Next Gen by PCR, Nasal     Status: None   Collection Time: 09/07/24 11:29 AM   Specimen: Nasal Mucosa; Nasal Swab  Result Value Ref Range Status   MRSA by PCR Next Gen NOT DETECTED NOT  DETECTED Final    Comment: (NOTE) The GeneXpert MRSA Assay (FDA approved for NASAL specimens only), is one component of a comprehensive MRSA colonization surveillance program. It is not intended to diagnose MRSA infection nor to guide or monitor treatment for MRSA infections. Test performance is not FDA approved in patients less than 88 years old. Performed at [facility], [address], [place], [place] [address]          Radiology Studies: DG CHEST PORT 1 VIEW Result Date: 09/10/2024 CLINICAL DATA:  Shortness of breath. EXAM: PORTABLE CHEST 1 VIEW COMPARISON:  Chest CTA dated 09/06/2024 and portable chest dated 09/06/2024. FINDINGS: Stable enlarged cardiac silhouette. Improved inspiration with increased prominence of the interstitial markings, including Kerley lines. Mild increase in size of a small to moderate-sized right pleural effusion. No significant change in patchy and linear opacity at the right lung base. Thoracic spine degenerative changes. IMPRESSION: 1. Stable cardiomegaly with interval interstitial pulmonary edema. 2. Mild increase in size of a small to moderate-sized right pleural effusion. 3. No significant change in right basilar atelectasis and possible pneumonia. Electronically Signed   By: Elspeth Bathe M.D.   On: 09/10/2024 16:31   DG ESOPHAGUS W SINGLE CM (SOL OR THIN BA) Result Date: 09/09/2024 EXAM: SINGLE CONTRAST ESOPHAGRAM 09/09/2024 12:35:45 PM TECHNIQUE: Multiple single contrast images of the esophagus and gastroesophageal junction were obtained following the oral administration of water soluble contrast. The patient had difficulty standing on today's exam and was not able to assume various standard positions and turning employed in routine esophagram. The protocol was modified including using LPO images and skipping the pharyngeal phase assessment in order to accommodate his ability. FLUOROSCOPY DOSE AND TYPE: Radiation Dose Index: Reference  Air Kerma (in mGy) = COMPARISON: None available. CLINICAL HISTORY: Dysphagia. FINDINGS: On the single contrast images, there is disruption of primary peristaltic waves in the proximal to mid esophagus on 3 out of 3 swallows compatible with esophageal dysmotility. No definite stricture identified. No luminal wall irregularity is appreciated with the understanding that today's examination was single contrast and has some limitations related to patient mobility. The 13 mm barium tablet had transient delay in the small hiatal hernia but with a second swallow of  water, passed into the intraabdominal portion of the stomach. A small type 1 hiatal hernia is observed. No evidence of leak. No evidence of achalasia. No gastroesophageal reflux was elicited during examination. IMPRESSION: 1. Nonspecific esophageal dysmotility disorder, with disruption of primary peristaltic waves in the proximal to mid esophagus. 2. Small type 1 hiatal hernia. Electronically signed by: Ryan Salvage MD 09/09/2024 01:05 PM EST RP Workstation: [id number]     Scheduled Meds:  famotidine   20 mg Oral BID   feeding supplement  1 Container Oral TID BM   fiber supplement (BANATROL TF)  60 mL Oral BID   fidaxomicin   200 mg Oral BID   insulin  aspart  0-6 Units Subcutaneous Q4H   sodium chloride  flush  3 mL Intravenous Q12H   sucralfate   1 g Oral TID WC & HS   tamsulosin   0.4 mg Oral QPM   Continuous Infusions:   LOS: 5 days   Almarie KANDICE Hoots, MD 09/11/2024, 9:21 AM   "

## 2024-09-11 NOTE — Plan of Care (Signed)

## 2024-09-11 NOTE — Care Management Important Message (Signed)
 Important Message  Patient Details IM Letter given. Name: Anthoney R Paget MRN: [id number] Date of Birth: 1941/08/23   Important Message Given:  Yes - Medicare IM     Adoria Kawamoto 09/11/2024, 10:15 AM

## 2024-09-11 NOTE — Progress Notes (Signed)
 Speech Language Pathology Treatment: Dysphagia  Patient Details Name: Edward [name] MRN: [id number] DOB: November 29, 1940 Today's Date: 09/11/2024 Time: 1448-1500 SLP Time Calculation (min) (ACUTE ONLY): 12 min  Assessment / Plan / Recommendation Clinical Impression  Plan: continue with current PO diet, no further SLP services warranted  Patient seen by SLP for skilled treatment focused on dysphagia goals. Per patient and his spouse, he still does not have much of an appetite for food, with patient indicating that even when his spouse got him a fish sandwich from [employer], I couldn't eat it and clarified that it was because of altered sense of taste. SLP reviewed barium esophagram which reported esophageal dysmotility. SLP provided education including: drinking more fluids, especially water, eating smaller, more frequent meals, avoiding getting full, avoiding foods that are dry, tough, dense, hard to chew.   HPI HPI: 83 yo male admitted 09/06/24 with chest pain. CXR: stable RLL opacity. No prior ST intervention. Barium esophagram reported: nonspecific dysmotility with disruption of primary  peristaltic waves in the proximal to mid esophagus, small hiatal hernia. 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, chemo 9d PTA), BPH, CKD3, DM2, GERD, HTN, HLD, NSVT, BPH, IBS      SLP Plan  Discharge SLP treatment due to (comment);All goals met        Swallow Evaluation Recommendations         Recommendations  Diet recommendations: Regular;Thin liquid Liquids provided via: Cup;Straw Medication Administration: Other (Comment) (as tolerated) Supervision: Patient able to self feed Compensations: Slow rate;Small sips/bites Postural Changes and/or Swallow Maneuvers: Seated upright 90 degrees;Upright 30-60 min after meal                  Oral care BID     Dysphagia, unspecified (R13.10)      Discharge SLP treatment due to (comment);All goals met     Norleen IVAR Blase, MA, CCC-SLP Speech Therapy   09/11/2024, 3:04 PM

## 2024-09-11 NOTE — Progress Notes (Signed)
 "  Palliative Medicine Inpatient Follow Up Note HPI: Edward [name] is a 83 y.o. 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 Palliative care has been asked to support additional goals of care conversations.   Today's Discussion 09/11/2024  *Please note that this is a verbal dictation therefore any spelling or grammatical errors are due to the Dragon [employer] One system interpretation.  I reviewed the chart notes including nursing notes from today, progress notes from today. I also reviewed vital signs, nursing flowsheets, medication administrations record, labs, and imaging.    I met this afternoon with [name], his wife, [name], and his son. [name] this afternoon. Darriel is sitting up in the chair which has made him feel better. He shares that he had radiation today.   [name] shares that he remains to have little appetite even with palatable foods. His taste sensation has been diminished. We discussed trying to eat small frequent bland meals - if he continues to have a poor appetite reviewed consideration of an appetite stimulator.  [name] was able to mobilize with the FWW. He does feel his strength is somewhat improved.   We discussed the importance of mobility and nutrition. We reviewed patients ongoing goal(s) of gaining and maintaining strength.   [name] denies pain, shortness of breath, or nausea.   Questions and concerns addressed/Palliative Support Provided.   Objective Assessment: Vital Signs Vitals:   09/10/24 1340 09/10/24 2019  BP: 129/63 107/69  Pulse: 100 82  Resp: 20 20  Temp: 98.7 F  (37.1 C) (!) 97.5 F (36.4 C)  SpO2: 96% 96%    Intake/Output Summary (Last 24 hours) at 09/11/2024 9188 Last data filed at 09/10/2024 2208 Gross per 24 hour  Intake --  Output 3300 ml  Net -3300 ml   Last Weight  Most recent update: 09/06/2024 12:47 PM    Weight  71 kg (156 lb 8.4 oz)            Gen:  Elderly Caucasian M chronically ill appearing HEENT: moist mucous membranes CV: Regular rate and rhythm  PULM:On RA, breathing is even and nonlabored   ABD: soft/nontender  EXT: No edema  Neuro: Alert and oriented x2-3  SUMMARY OF RECOMMENDATIONS   DNAR/DNI   Allow time for outcomes   Patients goals remain for chemotherapy and radiation treatments to see if he may gain some independence back thereafter    OP Palliative Oncology symptom clinic referral made   Ongoing PMT support   Code Status/Advance Care Planning: DNAR/DNI   Symptom Management:  Reflux: - Famotidine  & Carafate  per primary - Protonix  stopped - Upright for meals 30 minutes after ingestion - Low acid meals   Adult FTT/Altered taste: - Nutrition Consult - Continue supplemental's as tolerated - Bland foods - If continues would consider mirtazapine    Diarrhea d/t C.Diff: - Per primary team   Weakness: - PT/OT - Daily mobility   Emotional Support: - Therapeutic Listening  ______________________________________________________________________________________ Rosaline Becton [place] Palliative Medicine Team Team Cell Phone: [phone] Please utilize secure chat with additional questions, if there is no response within 30 minutes please call the above phone number  Time Spent: 35  Palliative Medicine  Team providers are available by phone from 7am to 7pm daily and can be reached through the team cell phone.  Should this patient require assistance outside of these hours, please call the patient's attending physician.     "

## 2024-09-11 NOTE — Progress Notes (Signed)
 Physical Therapy Treatment Patient Details Name: Edward [name] MRN: [id number] DOB: 10/27/1940 Today's Date: 09/11/2024   History of Present Illness Edward [name] is an 83 yo male presents with significant fatigue, burning midsternal chest pain/indigestion, abdominal pain, vomiting and diarrhea, with chills; admitted with C. difficile colitis with underlying neutropenia. PMH: non-small cell lung cancer diagnosed in November 2025, and possible atypical carcinoid, CKD stage III, type 2 diabetes mellitus, hypertension, hyperlipidemia, NSVT, and BPH    PT Comments  Pt AxO x 3 pleasant and motivated.  In bed on 3 lts sats 94%.  Son and Pt's Spouse in room.  Assisted OOB to amb in hallway.  General Gait Details: tolerated a functional distance however required assist from walker for stability ans well as 3 lts oxygen to achieve sats >92%.  Mild c/o dyspnea.  Feeling better but NOT 100%. Assisted to bathroom per Pt request to have a BM.  Pt requesting to stay longer in bathroom.  Instructed to pull cord and notfied Nursing staff.  Family also in room.  Pt plans to return home with Family support.  LPT has rec HH PT.    If plan is discharge home, recommend the following: A little help with walking and/or transfers;A little help with bathing/dressing/bathroom;Assistance with cooking/housework;Assist for transportation;Help with stairs or ramp for entrance   Can travel by private vehicle        Equipment Recommendations       Recommendations for Other Services       Precautions / Restrictions Precautions Precautions: Fall Precaution/Restrictions Comments: monitor sats Restrictions Weight Bearing Restrictions Per Provider Order: No     Mobility  Bed Mobility   Bed Mobility: Supine to Sit     Supine to sit: Supervision, Contact guard     General bed mobility comments: HOB slightly elevated, no bedrail use,increased time    Transfers Overall transfer level: Needs  assistance Equipment used: None Transfers: Sit to/from Stand Sit to Stand: Contact guard assist   Step pivot transfers: Contact guard assist       General transfer comment: slightly unsteady with STS and SPT with RW support utilized for safety    Ambulation/Gait Ambulation/Gait assistance: Supervision, Contact guard assist Gait Distance (Feet): 115 Feet Assistive device: Rolling walker (2 wheels) Gait Pattern/deviations: Step-through pattern, Decreased stride length Gait velocity: WNL     General Gait Details: tolerated a functional distance however required assist from walker for stability ans well as 3 lts oxygen to achieve sats >92%.  Mild c/o dyspnea.  Feeling better but NOT 100%.   Stairs             Wheelchair Mobility     Tilt Bed    Modified Rankin (Stroke Patients Only)       Balance                                            Communication Communication Communication: No apparent difficulties  Cognition Arousal: Alert Behavior During Therapy: WFL for tasks assessed/performed   PT - Cognitive impairments: No apparent impairments                       PT - Cognition Comments: AxO x 3 pleasant and willing Following commands: Intact      Cueing Cueing Techniques: Verbal cues  Exercises      General Comments  Pertinent Vitals/Pain Pain Assessment Pain Assessment: No/denies pain    Home Living                          Prior Function            PT Goals (current goals can now be found in the care plan section) Progress towards PT goals: Progressing toward goals    Frequency    Min 3X/week      PT Plan      Co-evaluation              AM-PAC PT 6 Clicks Mobility   Outcome Measure  Help needed turning from your back to your side while in a flat bed without using bedrails?: A Little Help needed moving from lying on your back to sitting on the side of a flat bed without  using bedrails?: A Little Help needed moving to and from a bed to a chair (including a wheelchair)?: A Little Help needed standing up from a chair using your arms (e.g., wheelchair or bedside chair)?: A Little Help needed to walk in hospital room?: A Little Help needed climbing 3-5 steps with a railing? : A Lot 6 Click Score: 17    End of Session Equipment Utilized During Treatment: Gait belt Activity Tolerance: Patient limited by fatigue Patient left: Other (comment) (left in bathroom with instructions to pull cord, Family in room and also notifed RN/NT  Pt attempting to have a BM) Nurse Communication: Mobility status PT Visit Diagnosis: Other abnormalities of gait and mobility (R26.89);Muscle weakness (generalized) (M62.81)     Time: 8587-8563 PT Time Calculation (min) (ACUTE ONLY): 24 min  Charges:    $Gait Training: 8-22 mins $Therapeutic Activity: 8-22 mins PT General Charges $$ ACUTE PT VISIT: 1 Visit                     Katheryn Leap  PTA Acute  Rehabilitation Services Office M-F          [phone]

## 2024-09-12 DIAGNOSIS — D709 Neutropenia, unspecified: Secondary | ICD-10-CM | POA: Diagnosis not present

## 2024-09-12 LAB — CULTURE, BLOOD (ROUTINE X 2)
Culture: NO GROWTH
Culture: NO GROWTH
Special Requests: ADEQUATE

## 2024-09-12 LAB — BASIC METABOLIC PANEL WITH GFR
Anion gap: 6 (ref 5–15)
BUN: 13 mg/dL (ref 8–23)
CO2: 28 mmol/L (ref 22–32)
Calcium: 7.5 mg/dL — ABNORMAL LOW (ref 8.9–10.3)
Chloride: 100 mmol/L (ref 98–111)
Creatinine, Ser: 0.99 mg/dL (ref 0.61–1.24)
GFR, Estimated: >60 mL/min
Glucose, Bld: 212 mg/dL — ABNORMAL HIGH (ref 70–99)
Potassium: 3.6 mmol/L (ref 3.5–5.1)
Sodium: 133 mmol/L — ABNORMAL LOW (ref 135–145)

## 2024-09-12 LAB — CBC
HCT: 32.4 % — ABNORMAL LOW (ref 39.0–52.0)
Hemoglobin: 10.5 g/dL — ABNORMAL LOW (ref 13.0–17.0)
MCH: 28.3 pg (ref 26.0–34.0)
MCHC: 32.4 g/dL (ref 30.0–36.0)
MCV: 87.3 fL (ref 80.0–100.0)
Platelets: 41 K/uL — ABNORMAL LOW (ref 150–400)
RBC: 3.71 MIL/uL — ABNORMAL LOW (ref 4.22–5.81)
RDW: 13.6 % (ref 11.5–15.5)
WBC: 2.2 K/uL — ABNORMAL LOW (ref 4.0–10.5)
nRBC: 0 % (ref 0.0–0.2)

## 2024-09-12 LAB — GLUCOSE, CAPILLARY
Glucose-Capillary: 121 mg/dL — ABNORMAL HIGH (ref 70–99)
Glucose-Capillary: 127 mg/dL — ABNORMAL HIGH (ref 70–99)
Glucose-Capillary: 135 mg/dL — ABNORMAL HIGH (ref 70–99)
Glucose-Capillary: 161 mg/dL — ABNORMAL HIGH (ref 70–99)
Glucose-Capillary: 191 mg/dL — ABNORMAL HIGH (ref 70–99)
Glucose-Capillary: 229 mg/dL — ABNORMAL HIGH (ref 70–99)

## 2024-09-12 NOTE — Progress Notes (Signed)
 " PROGRESS NOTE    Edward [name]  FMW:[id number] DOB: [birth year] DOA: 09/06/2024 PCP: [name], NP-C  Brief Narrative:  84 y.o. 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  Patient's first chemotherapy dose was on December 16/2025, also undergoing radiation, with last day being on 10/03/2024.   Patient was recently seen at the cancer center on 12/24, noted to be neutropenic secondary to his chemo and received G-CSF injection.  On 12/26 presented back to the cancer center for his second dose of G-CSF, due to his persistent symptoms, was sent to the ED for further management.   In the ED, vital signs fairly stable, labs show pancytopenia including severe neutropenia. CTA of his chest revealed right infahilar mass, unchanged right pleural effusion and right middle lobe consolidation, slightly decreased right lower lobe airspace disease all compared to his last CT.  patient was started on IV antibiotics and admitted for further management. 09/11/2024 patient denies having any diarrhea however he has very poor appetite has not been eating or drinking at all.  He feels no taste in his mouth.  Denies nausea vomiting.   Assessment & Plan:   Principal Problem:   Neutropenic Active Problems:   Obstructive pneumonia   Sepsis (HCC)   C. difficile colitis   Malignant neoplasm of lung (HCC)  C. difficile colitis with underlying neutropenia Nausea/vomiting resolved diarrhea and vomiting however appetite remains low.   Currently afebrile, with resolving neutropenia BC x 2 NGTD, MRSA PCR negative Stool sample  positive for C. Difficile positive antigen and positive toxin with diarrhea on admission  GIP panel negative Lactic acid WNL DG abdomen no SBO, or megacolon ID on board, signed off 09/09/2024 continue p.o. Dificid  last dose September 17, 2024  Discontinued systemic IV antibiotics after 48 hours given C. difficile infection Encourage PO Neutropenic and enteric precaution Telemetry, monitor closely   Epigastric pain Possible esophageal dysphagia/radiation-induced Reports burning in nature Esophagram showed nonspecific esophageal dysmotility disorder SLP consulted, appreciate recs Continue famotidine  IV (hold PPI in the setting of C. Difficile), Carafate  suspension Outpatient follow-up with GI   Pancytopenia Likely 2/2 chemotherapy, with improved neutropenia S/p filgrastim , with improvement in ANC Daily CBC, neutropenic precautions Oncology consulted Daily CBC pending   Hypokalemia lab from today pending  Acute hypoxic respiratory failure Possible acute on chronic systolic and diastolic HF/volume overload Likely from aggressive fluid resuscitation Requiring about 2 L of O2 to maintain sats in the 90s, visibly SOB ProBNP elevated on admission Initial CXR, no congestion, repeat chest x-ray 09/10/2024 interstitial pulmonary edema mild increase in the size of a small to moderate right pleural effusion Echo done 08/2024 showed EF of 40 to 45%, no regional wall motion abnormalities, grade 1 diastolic dysfunction He was given Lasix  first dose on 30 December Pressure on the soft side 107/69 Remains on 2 L of oxygen to maintain saturation above 92% Will monitor closely and reassess daily the need for diuresis.  Possible postobstructive pneumonia/radiation induced pneumonitis ??Aspiration pneumonia Pro-Cal 0.73 CTA of his chest revealed right infahilar mass, unchanged right pleural effusion and right middle lobe consolidation, slightly decreased right lower lobe airspace disease all  compared to his last CT  Discontinue IV Vanco, cefepime  x 48 hours SLP consulted DuoNebs, supplemental O2 as needed   Mildly elevated troponin Multiple PVCs Troponin 33-36, flat trend, denies any left-sided pain, trace pedal edema EKG with no acute ST changes, noted PVCs, old left bundle branch block Follows with cardiology/EP, recommended on previous admission metoprolol  12.5 mg twice daily, currently on hold due to soft BP/intermittent bradycardia, may resume pending vitals due to noted PVCs Telemetry   Diabetes mellitus type 2 Last A1c 7 on 06/21/2024 SSI, Accu-Cheks, hypoglycemic protocol   Non-small cell lung cancer/neuroendocrine tumor Diagnosed in November 2025 Currently on chemoradiation, followed by Dr. Gatha, added to the treatment team First chemotherapy dose was on December 16/2025, also undergoing radiation, with last day being on 10/03/2024 Oncology consulted   Goals of care discussion Overall very poor prognosis given above Palliative/hospice consulted, appreciate recs Seen by physical therapy recommending home with home health when patient ready to be discharged.     Nutrition Problem: Increased nutrient needs Etiology: cancer and cancer related treatments     Signs/Symptoms: estimated needs    Interventions: Boost Breeze, Refer to RD note for recommendations  Estimated body mass index is 23.11 kg/m as calculated from the following:   Height as of this encounter: 5' 9 (1.753 m).   Weight as of this encounter: 71 kg.  DVT prophylaxis:  scd Code Status: dnr Family Communication:none Disposition Plan:  Status is: Inpatient Remains inpatient appropriate because: Acute illness   Consultants:  ID oncology and palliative  Procedures: None Antimicrobials: Dificid   Subjective:  No events overnight patient resting in bed  Objective: Vitals:   09/11/24 0425 09/11/24 1448 09/11/24 2031 09/12/24 0423  BP: 120/69 132/88 (!) 115/57 121/81  Pulse:  80 85 75 75  Resp: 19 17 18 18   Temp: 98.9 F (37.2 C) 100.3 F (37.9 C) 98.1 F (36.7 C) 98.4 F (36.9 C)  TempSrc: Oral Oral    SpO2: 96% 100% 96% 96%  Weight:      Height:        Intake/Output Summary (Last 24 hours) at 09/12/2024 1221 Last data filed at 09/12/2024 1010 Gross per 24 hour  Intake 483 ml  Output 850 ml  Net -367 ml   Filed Weights   09/06/24 1247  Weight: 71 kg    Examination:  General exam: Appears frail and chronically ill-appearing Respiratory system: Clear to auscultation. Respiratory effort normal. Cardiovascular system:reg Gastrointestinal system: soft nt Central nervous system: Alert and oriented.  Extremities: no edema  Data Reviewed: I have personally reviewed following labs and imaging studies  CBC: Recent Labs  Lab 09/07/24 0700 09/08/24 0644 09/08/24 2204 09/09/24 0620 09/10/24 0700 09/11/24 0500  WBC 0.2* 0.2* 0.4* 0.7* 2.3* 1.8*  NEUTROABS 0.0* 0.1*  --  0.5* 1.9 1.6*  HGB 10.9* 10.3* 10.1* 9.7* 9.5* 10.3*  HCT 33.1* 31.5* 30.9* 29.5* 28.5* 31.1*  MCV 87.1 87.0 87.3 87.5 86.9 87.1  PLT 31* 22* 19* 19* 24* 33*   Basic Metabolic Panel: Recent Labs  Lab 09/07/24 0700 09/08/24 0644 09/08/24 0654 09/08/24 2204 09/09/24 0620 09/10/24 0700 09/11/24 0500  NA 134* 136  --  135 137 138 136  K 3.5 3.0*  --  2.9* 3.6 2.9* 2.9*  CL 98 101  --  104 105 108 102  CO2 27 25  --  22 21* 22 27  GLUCOSE 153* 147*  --  101* 98 119* 137*  BUN 13 18  --  17 15 15 11   CREATININE  1.14 1.12  --  0.84 0.84 0.75 0.85  CALCIUM  7.9* 7.7*  --  7.6* 7.5* 7.3* 7.3*  MG 1.9  --  1.9 1.9  --  2.1  --    GFR: Estimated Creatinine Clearance: 67 mL/min (by C-G formula based on SCr of 0.85 mg/dL). Liver Function Tests: No results for input(s): AST, ALT, ALKPHOS, BILITOT, PROT, ALBUMIN in the last 168 hours. No results for input(s): LIPASE, AMYLASE in the last 168 hours. No results for input(s): AMMONIA in the last 168  hours. Coagulation Profile: No results for input(s): INR, PROTIME in the last 168 hours. Cardiac Enzymes: No results for input(s): CKTOTAL, CKMB, CKMBINDEX, TROPONINI in the last 168 hours. BNP (last 3 results) Recent Labs    08/15/24 1242 09/06/24 1252  PROBNP 1,200.0* 2,376.0*   HbA1C: No results for input(s): HGBA1C in the last 72 hours. CBG: Recent Labs  Lab 09/11/24 2033 09/12/24 0026 09/12/24 0419 09/12/24 0718 09/12/24 1132  GLUCAP 164* 135* 121* 127* 229*   Lipid Profile: No results for input(s): CHOL, HDL, LDLCALC, TRIG, CHOLHDL, LDLDIRECT in the last 72 hours. Thyroid  Function Tests: No results for input(s): TSH, T4TOTAL, FREET4, T3FREE, THYROIDAB in the last 72 hours. Anemia Panel: No results for input(s): VITAMINB12, FOLATE, FERRITIN, TIBC, IRON, RETICCTPCT in the last 72 hours. Sepsis Labs: Recent Labs  Lab 09/07/24 1635  PROCALCITON 0.73  LATICACIDVEN 1.2    Recent Results (from the past 240 hours)  Resp panel by RT-PCR (RSV, Flu A&B, Covid) Anterior Nasal Swab     Status: None   Collection Time: 09/06/24  1:05 PM   Specimen: Anterior Nasal Swab  Result Value Ref Range Status   SARS Coronavirus 2 by RT PCR NEGATIVE NEGATIVE Final    Comment: (NOTE) SARS-CoV-2 target nucleic acids are NOT DETECTED.  The SARS-CoV-2 RNA is generally detectable in upper respiratory specimens during the acute phase of infection. The lowest concentration of SARS-CoV-2 viral copies this assay can detect is 138 copies/mL. A negative result does not preclude SARS-Cov-2 infection and should not be used as the sole basis for treatment or other patient management decisions. A negative result may occur with  improper specimen collection/handling, submission of specimen other than nasopharyngeal swab, presence of viral mutation(s) within the areas targeted by this assay, and inadequate number of viral copies(<138 copies/mL). A  negative result must be combined with clinical observations, patient history, and epidemiological information. The expected result is Negative.  Fact Sheet for Patients:  bloggercourse.com  Fact Sheet for Healthcare Providers:  seriousbroker.it  This test is no t yet approved or cleared by the [place]  FDA and  has been authorized for detection and/or diagnosis of SARS-CoV-2 by FDA under an Emergency Use Authorization (EUA). This EUA will remain  in effect (meaning this test can be used) for the duration of the COVID-19 declaration under Section 564(b)(1) of the Act, 21 U.S.C.section 360bbb-3(b)(1), unless the authorization is terminated  or revoked sooner.       Influenza A by PCR NEGATIVE NEGATIVE Final   Influenza B by PCR NEGATIVE NEGATIVE Final    Comment: (NOTE) The Xpert Xpress SARS-CoV-2/FLU/RSV plus assay is intended as an aid in the diagnosis of influenza from Nasopharyngeal swab specimens and should not be used as a sole basis for treatment. Nasal washings and aspirates are unacceptable for Xpert Xpress SARS-CoV-2/FLU/RSV testing.  Fact Sheet for Patients: bloggercourse.com  Fact Sheet for Healthcare Providers: seriousbroker.it  This test is not yet approved or cleared by the  [place]  FDA and has been authorized for detection and/or diagnosis of SARS-CoV-2 by FDA under an Emergency Use Authorization (EUA). This EUA will remain in effect (meaning this test can be used) for the duration of the COVID-19 declaration under Section 564(b)(1) of the Act, 21 U.S.C. section 360bbb-3(b)(1), unless the authorization is terminated or revoked.     Resp Syncytial Virus by PCR NEGATIVE NEGATIVE Final    Comment: (NOTE) Fact Sheet for Patients: bloggercourse.com  Fact Sheet for Healthcare  Providers: seriousbroker.it  This test is not yet approved or cleared by the [place]  FDA and has been authorized for detection and/or diagnosis of SARS-CoV-2 by FDA under an Emergency Use Authorization (EUA). This EUA will remain in effect (meaning this test can be used) for the duration of the COVID-19 declaration under Section 564(b)(1) of the Act, 21 U.S.C. section 360bbb-3(b)(1), unless the authorization is terminated or revoked.  Performed at [facility], [address], [place], [place] [address]   C Difficile Quick Screen w PCR reflex     Status: Abnormal   Collection Time: 09/07/24  6:36 AM   Specimen: STOOL  Result Value Ref Range Status   C Diff antigen POSITIVE (A) NEGATIVE Final   C Diff toxin POSITIVE (A) NEGATIVE Final   C Diff interpretation Toxin producing C. difficile detected.  Final    Comment: CRITICAL RESULT CALLED TO, READ BACK BY AND VERIFIED WITH: ENOLA PARAS RN AT 9268 ON 09/07/2024 BY PRUDY POUR Performed at [facility], [address], [place], [place] [address]   Gastrointestinal Panel by PCR , Stool     Status: None   Collection Time: 09/07/24  7:34 AM   Specimen: Stool  Result Value Ref Range Status   Campylobacter species NOT DETECTED NOT DETECTED Final   Plesimonas shigelloides NOT DETECTED NOT DETECTED Final   Salmonella species NOT DETECTED NOT DETECTED Final   Yersinia enterocolitica NOT DETECTED NOT DETECTED Final   Vibrio species NOT DETECTED NOT DETECTED Final   Vibrio cholerae NOT DETECTED NOT DETECTED Final   Enteroaggregative E coli (EAEC) NOT DETECTED NOT DETECTED Final   Enteropathogenic E coli (EPEC) NOT DETECTED NOT DETECTED Final   Enterotoxigenic E coli (ETEC) NOT DETECTED NOT DETECTED Final   Shiga like toxin producing E coli (STEC) NOT DETECTED NOT DETECTED Final   Shigella/Enteroinvasive E coli (EIEC) NOT DETECTED NOT DETECTED Final   Cryptosporidium NOT  DETECTED NOT DETECTED Final   Cyclospora cayetanensis NOT DETECTED NOT DETECTED Final   Entamoeba histolytica NOT DETECTED NOT DETECTED Final   Giardia lamblia NOT DETECTED NOT DETECTED Final   Adenovirus F40/41 NOT DETECTED NOT DETECTED Final   Astrovirus NOT DETECTED NOT DETECTED Final   Norovirus GI/GII NOT DETECTED NOT DETECTED Final   Rotavirus A NOT DETECTED NOT DETECTED Final   Sapovirus (I, II, IV, and V) NOT DETECTED NOT DETECTED Final    Comment: Performed at [facility], [address] [place], [place] [address]  Culture, blood (Routine X 2) w Reflex to ID Panel     Status: None   Collection Time: 09/07/24  9:01 AM   Specimen: BLOOD  Result Value Ref Range Status   Specimen Description   Final    BLOOD BLOOD RIGHT ARM Performed at [facility], [address], [place], [place] [address]    Special Requests   Final    BOTTLES DRAWN AEROBIC AND ANAEROBIC Blood Culture results may not be optimal due to  an inadequate volume of blood received in culture bottles Performed at [facility], [address] [place], [place] [address]    Culture   Final    NO GROWTH 5 DAYS Performed at [facility], [address] [place], [place] [address]    Report Status 09/12/2024 FINAL  Final  Culture, blood (Routine X 2) w Reflex to ID Panel     Status: None   Collection Time: 09/07/24  9:05 AM   Specimen: BLOOD  Result Value Ref Range Status   Specimen Description   Final    BLOOD BLOOD RIGHT ARM Performed at [facility], [address], [place], [place] [address]    Special Requests   Final    BOTTLES DRAWN AEROBIC ONLY Blood Culture adequate volume Performed at [facility], [address] [place], [place] [address]    Culture   Final    NO GROWTH 5 DAYS Performed at [facility], [address], [place], [place] [address]    Report Status 09/12/2024 FINAL  Final  MRSA Next Gen  by PCR, Nasal     Status: None   Collection Time: 09/07/24 11:29 AM   Specimen: Nasal Mucosa; Nasal Swab  Result Value Ref Range Status   MRSA by PCR Next Gen NOT DETECTED NOT DETECTED Final    Comment: (NOTE) The GeneXpert MRSA Assay (FDA approved for NASAL specimens only), is one component of a comprehensive MRSA colonization surveillance program. It is not intended to diagnose MRSA infection nor to guide or monitor treatment for MRSA infections. Test performance is not FDA approved in patients less than 72 years old. Performed at [facility], [address] [place], [place] [address]          Radiology Studies: No results found.    Scheduled Meds:  famotidine   20 mg Oral BID   feeding supplement  1 Container Oral TID BM   fiber supplement (BANATROL TF)  60 mL Oral BID   fidaxomicin   200 mg Oral BID   insulin  aspart  0-6 Units Subcutaneous Q4H   sodium chloride  flush  3 mL Intravenous Q12H   sucralfate   1 g Oral TID WC & HS   tamsulosin   0.4 mg Oral QPM   Continuous Infusions:   LOS: 6 days   Almarie KANDICE Hoots, MD 09/12/2024, 12:21 PM   "

## 2024-09-12 NOTE — Progress Notes (Signed)
 "                                                                                                                                                                                                          Daily Progress Note   Patient Name: Edward [name]       Date: 09/12/2024 DOB: 1941-02-24  Age: 84 y.o. MRN#: [id number] Attending Physician: Will Almarie MATSU, MD Primary Care Physician: [name], NP-C Admit Date: 09/06/2024  Reason for Consultation/Follow-up: Establishing goals of care  Patient Profile/HPI:  Edward [name] is a 84 y.o. male with medical history significant for non-small cell lung cancer diagnosed in November 2025 (started chemotherapy in December and is currently on radiation), and possible atypical carcinoid, CKD stage III, type 2 diabetes mellitus, hypertension, hyperlipidemia, NSVT, and BPH, presents with significant fatigue, burning midsternal chest pain/indigestion, abdominal pain, vomiting and diarrhea, with chills that has been ongoing since the past few weeks, but worsened over the past couple of days.  He was admitted and being treated for neutropenia, C. Diff, and pneumonia. Palliative care has been asked to support additional goals of care conversations.   Subjective: Chart reviewed including labs, progress notes, imaging from this and previous encounters.  WBC has improved to 2.2 today. He continues to be on 3L oxygen. He has been transitioned to oral Dificid  and continues on IV cefepime  for his pneumonia.  He reports feeling better and is eager to be discharged home.  His spouse is at bedside- I reviewed his chart with her per her request. We discussed his lab results showing improvement in his neutropenia. We discussed his antibiotic treatment for pneumonia and treatment for C. Diff.  We discussed that he continues on supplemental oxygen- he was on room air prior to admission. Discussed hopeful for return to room air and need to complete IV  antibiotics before he can discharge.   Review of Systems  Constitutional: Negative.      Physical Exam Vitals and nursing note reviewed.  Constitutional:      General: He is not in acute distress. Cardiovascular:     Rate and Rhythm: Normal rate.  Pulmonary:     Effort: Pulmonary effort is normal.  Neurological:     Mental Status: He is alert and oriented to person, place, and time.             Vital Signs: BP 112/76 (BP Location: Left Arm)   Pulse (!) 103   Temp 99.1 F (37.3 C) (Oral)   Resp (!) 22  Ht 5' 9 (1.753 m)   Wt 71 kg   SpO2 100%   BMI 23.11 kg/m  SpO2: SpO2: 100 % O2 Device: O2 Device: Room Air O2 Flow Rate: O2 Flow Rate (L/min): 2 L/min  Intake/output summary:  Intake/Output Summary (Last 24 hours) at 09/12/2024 1541 Last data filed at 09/12/2024 1010 Gross per 24 hour  Intake 243 ml  Output 850 ml  Net -607 ml   LBM: Last BM Date : 09/10/24 Baseline Weight: Weight: 71 kg Most recent weight: Weight: 71 kg       Palliative Assessment/Data: PPS: 50%      Patient Active Problem List   Diagnosis Date Noted   Sepsis (HCC) 09/07/2024   C. difficile colitis 09/07/2024   Malignant neoplasm of lung (HCC) 09/07/2024   Neutropenic 09/06/2024   Chemotherapy induced neutropenia 09/04/2024   Obstructive pneumonia 08/15/2024   Acute hypoxic respiratory failure (HCC) 08/15/2024   Primary malignant neuroendocrine neoplasm of lung (HCC) 08/05/2024   Lung nodule 07/25/2024   Rib pain on right side 06/27/2024   Muscle spasm 06/27/2024   Right lower lobe lung mass 06/27/2024   Allergic rhinoconjunctivitis 06/27/2024   Type 2 diabetes with circulatory disorder causing erectile dysfunction (HCC) 06/27/2024   Encounter for general adult medical examination with abnormal findings 03/12/2024   Erectile dysfunction due to type 2 diabetes mellitus (HCC) 04/09/2021   Former smoker 09/02/2018   FHx: heart disease 09/02/2018   CKD stage 3 due to type 2 diabetes  mellitus (HCC) 11/02/2017   Type 2 diabetes mellitus with stage 3 chronic kidney disease, without long-term current use of insulin  (HCC) 11/01/2014   Overweight (BMI 25.0-29.9) 07/25/2014   Hyperlipidemia associated with type 2 diabetes mellitus (HCC)    Gastroesophageal reflux disease    Essential hypertension    Vitamin D  deficiency    BPH with obstruction/lower urinary tract symptoms     Palliative Care Assessment & Plan    Assessment/Recommendations/Plan  Pneumonia and C. Diff in the setting of neutropenia related to chemotherapy for non small cell lung cancer- continue current interventions- GOC is discharge home and continue cancer treatments   Code Status:   Code Status: Limited: Do not attempt resuscitation (DNR) -DNR-LIMITED -Do Not Intubate/DNI    Prognosis:  Unable to determine  Discharge Planning: To Be Determined  Care plan was discussed with patient, spouse and attending Dr. Will.  Thank you for allowing the Palliative Medicine Team to assist in the care of this patient.  Total time:  55 minutes Prolonged billing:  Time includes:   Preparing to see the patient (e.g., review of tests) Obtaining and/or reviewing separately obtained history Performing a medically necessary appropriate examination and/or evaluation Counseling and educating the patient/family/caregiver Ordering medications, tests, or procedures Referring and communicating with other health care professionals (when not reported separately) Documenting clinical information in the electronic or other health record Independently interpreting results (not reported separately) and communicating results to the patient/family/caregiver Care coordination (not reported separately) Clinical documentation  Cassondra Stain, AGNP-C Palliative Medicine   Please contact Palliative Medicine Team phone at [phone] for questions and concerns.        "

## 2024-09-12 NOTE — Plan of Care (Signed)

## 2024-09-12 NOTE — Plan of Care (Signed)
  Problem: Education: Goal: Knowledge of General Education information will improve Description: Including pain rating scale, medication(s)/side effects and non-pharmacologic comfort measures Outcome: Progressing   Problem: Health Behavior/Discharge Planning: Goal: Ability to manage health-related needs will improve Outcome: Progressing   Problem: Activity: Goal: Risk for activity intolerance will decrease Outcome: Progressing   Problem: Nutrition: Goal: Adequate nutrition will be maintained Outcome: Progressing   Problem: Elimination: Goal: Will not experience complications related to urinary retention Outcome: Progressing   Problem: Pain Managment: Goal: General experience of comfort will improve and/or be controlled Outcome: Progressing

## 2024-09-13 ENCOUNTER — Other Ambulatory Visit: Payer: Self-pay

## 2024-09-13 ENCOUNTER — Ambulatory Visit

## 2024-09-13 DIAGNOSIS — R197 Diarrhea, unspecified: Secondary | ICD-10-CM | POA: Insufficient documentation

## 2024-09-13 DIAGNOSIS — D701 Agranulocytosis secondary to cancer chemotherapy: Secondary | ICD-10-CM | POA: Diagnosis not present

## 2024-09-13 DIAGNOSIS — C7A8 Other malignant neuroendocrine tumors: Secondary | ICD-10-CM | POA: Insufficient documentation

## 2024-09-13 DIAGNOSIS — T451X5A Adverse effect of antineoplastic and immunosuppressive drugs, initial encounter: Secondary | ICD-10-CM | POA: Diagnosis not present

## 2024-09-13 DIAGNOSIS — R911 Solitary pulmonary nodule: Secondary | ICD-10-CM | POA: Insufficient documentation

## 2024-09-13 DIAGNOSIS — R600 Localized edema: Secondary | ICD-10-CM | POA: Insufficient documentation

## 2024-09-13 LAB — BASIC METABOLIC PANEL WITH GFR
Anion gap: 6 (ref 5–15)
BUN: 11 mg/dL (ref 8–23)
CO2: 28 mmol/L (ref 22–32)
Calcium: 7.4 mg/dL — ABNORMAL LOW (ref 8.9–10.3)
Chloride: 102 mmol/L (ref 98–111)
Creatinine, Ser: 0.84 mg/dL (ref 0.61–1.24)
GFR, Estimated: >60 mL/min
Glucose, Bld: 127 mg/dL — ABNORMAL HIGH (ref 70–99)
Potassium: 3.4 mmol/L — ABNORMAL LOW (ref 3.5–5.1)
Sodium: 135 mmol/L (ref 135–145)

## 2024-09-13 LAB — GLUCOSE, CAPILLARY
Glucose-Capillary: 113 mg/dL — ABNORMAL HIGH (ref 70–99)
Glucose-Capillary: 127 mg/dL — ABNORMAL HIGH (ref 70–99)
Glucose-Capillary: 133 mg/dL — ABNORMAL HIGH (ref 70–99)
Glucose-Capillary: 135 mg/dL — ABNORMAL HIGH (ref 70–99)
Glucose-Capillary: 162 mg/dL — ABNORMAL HIGH (ref 70–99)
Glucose-Capillary: 260 mg/dL — ABNORMAL HIGH (ref 70–99)

## 2024-09-13 LAB — RAD ONC ARIA SESSION SUMMARY
Course Elapsed Days: 28
Plan Fractions Treated to Date: 17
Plan Prescribed Dose Per Fraction: 2 Gy
Plan Total Fractions Prescribed: 30
Plan Total Prescribed Dose: 60 Gy
Reference Point Dosage Given to Date: 34 Gy
Reference Point Session Dosage Given: 2 Gy
Session Number: 17

## 2024-09-13 LAB — CBC
HCT: 32.2 % — ABNORMAL LOW (ref 39.0–52.0)
Hemoglobin: 10.5 g/dL — ABNORMAL LOW (ref 13.0–17.0)
MCH: 28.2 pg (ref 26.0–34.0)
MCHC: 32.6 g/dL (ref 30.0–36.0)
MCV: 86.6 fL (ref 80.0–100.0)
Platelets: 45 K/uL — ABNORMAL LOW (ref 150–400)
RBC: 3.72 MIL/uL — ABNORMAL LOW (ref 4.22–5.81)
RDW: 13.6 % (ref 11.5–15.5)
WBC: 2.5 K/uL — ABNORMAL LOW (ref 4.0–10.5)
nRBC: 0 % (ref 0.0–0.2)

## 2024-09-13 MED ORDER — POTASSIUM CHLORIDE CRYS ER 20 MEQ PO TBCR
40.0000 meq | EXTENDED_RELEASE_TABLET | Freq: Once | ORAL | Status: AC
  Administered 2024-09-13: 40 meq via ORAL
  Filled 2024-09-13: qty 2

## 2024-09-13 MED ORDER — POTASSIUM CHLORIDE CRYS ER 20 MEQ PO TBCR
20.0000 meq | EXTENDED_RELEASE_TABLET | Freq: Every day | ORAL | Status: DC
  Administered 2024-09-13 – 2024-09-16 (×4): 20 meq via ORAL
  Filled 2024-09-13 (×4): qty 1

## 2024-09-13 NOTE — Progress Notes (Signed)
 Physical Therapy Treatment Patient Details Name: Edward [name] MRN: [id number] DOB: 1941/06/12 Today's Date: 09/13/2024   History of Present Illness Edward [name] is an 84 yo male presents with significant fatigue, burning midsternal chest pain/indigestion, abdominal pain, vomiting and diarrhea, with chills; admitted with C. difficile colitis with underlying neutropenia. PMH: non-small cell lung cancer diagnosed in November 2025, and possible atypical carcinoid, CKD stage III, type 2 diabetes mellitus, hypertension, hyperlipidemia, NSVT, and BPH    PT Comments  Pt noted to be soiled with BM at start of session. He was able to stand for ~1 minute with RW for pericare. He ambulated 82' with RW and 3L O2, SpO2 100% on 3L ~1 minute after walking. Ambulation distance limited by fatigue and need to use the bathroom.     If plan is discharge home, recommend the following: A little help with walking and/or transfers;A little help with bathing/dressing/bathroom;Assistance with cooking/housework;Assist for transportation;Help with stairs or ramp for entrance   Can travel by private vehicle        Equipment Recommendations  Rollator (4 wheels)    Recommendations for Other Services       Precautions / Restrictions Precautions Precautions: Fall Recall of Precautions/Restrictions: Intact Precaution/Restrictions Comments: monitor sats Restrictions Weight Bearing Restrictions Per Provider Order: No     Mobility  Bed Mobility   Bed Mobility: Supine to Sit     Supine to sit: Modified independent (Device/Increase time), HOB elevated     General bed mobility comments: HOB slightly elevated, no bedrail use,increased time; bed soiled with BM.    Transfers Overall transfer level: Needs assistance Equipment used: Rolling walker (2 wheels) Transfers: Sit to/from Stand Sit to Stand: Contact guard assist           General transfer comment: VCs hand placement; pt able to stand with RW  for ~1 minute for pericare 2* incontinence of bowel    Ambulation/Gait Ambulation/Gait assistance: Contact guard assist Gait Distance (Feet): 90 Feet Assistive device: Rolling walker (2 wheels) Gait Pattern/deviations: Step-through pattern, Decreased stride length Gait velocity: decr     General Gait Details: slow but steady, ambulated with 3L O2, SpO2 100% ~1 minute after walking   Stairs             Wheelchair Mobility     Tilt Bed    Modified Rankin (Stroke Patients Only)       Balance Overall balance assessment: Mild deficits observed, not formally tested   Sitting balance-Leahy Scale: Good       Standing balance-Leahy Scale: Fair                              Hotel Manager: No apparent difficulties  Cognition Arousal: Alert Behavior During Therapy: WFL for tasks assessed/performed   PT - Cognitive impairments: No apparent impairments                       PT - Cognition Comments: AxO x 3 pleasant and willing Following commands: Intact      Cueing Cueing Techniques: Verbal cues  Exercises      General Comments        Pertinent Vitals/Pain Pain Assessment Pain Assessment: No/denies pain    Home Living                          Prior Function  PT Goals (current goals can now be found in the care plan section) Acute Rehab PT Goals Patient Stated Goal: return home, play volleyball PT Goal Formulation: With patient Time For Goal Achievement: 09/23/24 Potential to Achieve Goals: Good Progress towards PT goals: Progressing toward goals    Frequency    Min 3X/week      PT Plan      Co-evaluation              AM-PAC PT 6 Clicks Mobility   Outcome Measure  Help needed turning from your back to your side while in a flat bed without using bedrails?: None Help needed moving from lying on your back to sitting on the side of a flat bed without using  bedrails?: A Little Help needed moving to and from a bed to a chair (including a wheelchair)?: A Little Help needed standing up from a chair using your arms (e.g., wheelchair or bedside chair)?: A Little Help needed to walk in hospital room?: A Little Help needed climbing 3-5 steps with a railing? : A Little 6 Click Score: 19    End of Session Equipment Utilized During Treatment: Gait belt;Oxygen Activity Tolerance: Patient limited by fatigue Patient left: Other (comment);with call bell/phone within reach;with nursing/sitter in room (on toilet) Nurse Communication: Mobility status;Other (comment) (bed soiled with BM) PT Visit Diagnosis: Other abnormalities of gait and mobility (R26.89);Muscle weakness (generalized) (M62.81)     Time: 9094-9076 PT Time Calculation (min) (ACUTE ONLY): 18 min  Charges:    $Gait Training: 8-22 mins PT General Charges $$ ACUTE PT VISIT: 1 Visit                     Edward [name] PT 09/13/2024  Acute Rehabilitation Services  Office [phone]

## 2024-09-13 NOTE — Plan of Care (Signed)

## 2024-09-13 NOTE — Progress Notes (Signed)
 " PROGRESS NOTE    Tigran Haynie Hamon  FMW:[id number] DOB: [birth year] DOA: 09/06/2024 PCP: [name], NP-C  Brief Narrative:  84 y.o. 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  Patient's first chemotherapy dose was on December 16/2025, also undergoing radiation, with last day being on 10/03/2024.   Patient was recently seen at the cancer center on 12/24, noted to be neutropenic secondary to his chemo and received G-CSF injection.  On 12/26 presented back to the cancer center for his second dose of G-CSF, due to his persistent symptoms, was sent to the ED for further management.   In the ED, vital signs fairly stable, labs show pancytopenia including severe neutropenia. CTA of his chest revealed right infahilar mass, unchanged right pleural effusion and right middle lobe consolidation, slightly decreased right lower lobe airspace disease all compared to his last CT.  patient was started on IV antibiotics and admitted for further management. 09/11/2024 patient denies having any diarrhea however he has very poor appetite has not been eating or drinking at all.  He feels no taste in his mouth.  Denies nausea vomiting.   Assessment & Plan:   Principal Problem:   Neutropenic Active Problems:   Obstructive pneumonia   Sepsis (HCC)   C. difficile colitis   Malignant neoplasm of lung (HCC)  C. difficile colitis with underlying neutropenia Nausea/vomiting resolved diarrhea and vomiting however appetite remains low.   Currently afebrile, with resolving neutropenia BC x 2 NGTD, MRSA PCR negative Stool sample  positive for C. Difficile positive antigen and positive toxin with diarrhea on admission  GIP panel negative Lactic acid WNL DG abdomen no SBO, or megacolon ID on board, signed off 09/09/2024 continue p.o. Dificid  last dose September 17, 2024  Discontinued systemic IV antibiotics after 48 hours given C. difficile infection Encourage PO Neutropenic and enteric precaution Telemetry, monitor closely   Epigastric pain Possible esophageal dysphagia/radiation-induced Reports burning in nature Esophagram showed nonspecific esophageal dysmotility disorder SLP consulted, appreciate recs Continue famotidine  IV (hold PPI in the setting of C. Difficile), Carafate  suspension Outpatient follow-up with GI   Pancytopenia Likely 2/2 chemotherapy, with improved neutropenia S/p filgrastim , with improvement in ANC Daily CBC, neutropenic precautions Oncology consulted Daily CBC pending   Hypokalemia k 3.4 replete  Acute hypoxic respiratory failure Possible acute on chronic systolic and diastolic HF/volume overload Likely from aggressive fluid resuscitation Requiring about 2 L of O2 to maintain sats in the 90s, visibly SOB ProBNP elevated on admission Initial CXR, no congestion, repeat chest x-ray 09/10/2024 interstitial pulmonary edema mild increase in the size of a small to moderate right pleural effusion Echo done 08/2024 showed EF of 40 to 45%, no regional wall motion abnormalities, grade 1 diastolic dysfunction He was given Lasix  first dose on 30 December Pressure on the soft side 107/69 Remains on 2 L of oxygen to maintain saturation above 92% Will monitor closely and reassess daily the need for diuresis.  Possible postobstructive pneumonia/radiation induced pneumonitis ??Aspiration pneumonia Pro-Cal 0.73 CTA of his chest revealed right infahilar mass, unchanged right pleural effusion and right middle lobe consolidation, slightly decreased right lower lobe airspace disease all compared to his  last CT Discontinue  IV Vanco, cefepime  x 48 hours SLP consulted DuoNebs, supplemental O2 as needed   Mildly elevated troponin Multiple PVCs Troponin 33-36, flat trend, denies any left-sided pain, trace pedal edema EKG with no acute ST changes, noted PVCs, old left bundle branch block Follows with cardiology/EP, recommended on previous admission metoprolol  12.5 mg twice daily, currently on hold due to soft BP/intermittent bradycardia, may resume pending vitals due to noted PVCs Telemetry   Diabetes mellitus type 2 Last A1c 7 on 06/21/2024 SSI, Accu-Cheks, hypoglycemic protocol   Non-small cell lung cancer/neuroendocrine tumor Diagnosed in November 2025 Currently on chemoradiation, followed by Dr. Gatha, added to the treatment team First chemotherapy dose was on December 16/2025, also undergoing radiation, with last day being on 10/07/2024 17 radiations pending as of 09/13/2024 Oncology consulted   Goals of care discussion Overall very poor prognosis given above Palliative/hospice consulted, appreciate recs Seen by physical therapy recommending home with home health when patient ready to be discharged.     Nutrition Problem: Increased nutrient needs Etiology: cancer and cancer related treatments     Signs/Symptoms: estimated needs    Interventions: Boost Breeze, Refer to RD note for recommendations  Estimated body mass index is 23.11 kg/m as calculated from the following:   Height as of this encounter: 5' 9 (1.753 m).   Weight as of this encounter: 71 kg.  DVT prophylaxis:  scd Code Status: dnr Family Communication:none Disposition Plan:  Status is: Inpatient Remains inpatient appropriate because: Acute illness   Consultants:  ID oncology and palliative  Procedures: None Antimicrobials: Dificid   Subjective:  No events overnight he is getting his radiation treatment today he has 17 more to go the last 1 will be on October 07, 2024  Objective: Vitals:    09/12/24 0423 09/12/24 1425 09/12/24 2006 09/13/24 0416  BP: 121/81 112/76 111/66 126/72  Pulse: 75 (!) 103 (!) 49 89  Resp: 18 (!) 22 18 18   Temp: 98.4 F (36.9 C) 99.1 F (37.3 C) 98.4 F (36.9 C) 98.1 F (36.7 C)  TempSrc:  Oral Oral Oral  SpO2: 96% 100% 98% 97%  Weight:      Height:        Intake/Output Summary (Last 24 hours) at 09/13/2024 1128 Last data filed at 09/13/2024 0844 Gross per 24 hour  Intake 720 ml  Output 850 ml  Net -130 ml   Filed Weights   09/06/24 1247  Weight: 71 kg    Examination:  General exam: Appears frail and chronically ill-appearing Respiratory system: Clear to auscultation. Respiratory effort normal. Cardiovascular system:reg Gastrointestinal system: soft nt Central nervous system: Alert and oriented.  Extremities: no edema  Data Reviewed: I have personally reviewed following labs and imaging studies  CBC: Recent Labs  Lab 09/07/24 0700 09/08/24 0644 09/08/24 2204 09/09/24 0620 09/10/24 0700 09/11/24 0500 09/12/24 1221 09/13/24 0712  WBC 0.2* 0.2*   < > 0.7* 2.3* 1.8* 2.2* 2.5*  NEUTROABS 0.0* 0.1*  --  0.5* 1.9 1.6*  --   --   HGB 10.9* 10.3*   < > 9.7* 9.5* 10.3* 10.5* 10.5*  HCT 33.1* 31.5*   < > 29.5* 28.5* 31.1* 32.4* 32.2*  MCV 87.1 87.0   < > 87.5 86.9 87.1 87.3 86.6  PLT 31* 22*   < > 19* 24* 33* 41* 45*   < > = values in this interval not displayed.   Basic Metabolic Panel: Recent Labs  Lab 09/07/24 0700 09/08/24 0644 09/08/24 0654 09/08/24 2204 09/09/24 0620 09/10/24  0700 09/11/24 0500 09/12/24 1221 09/13/24 0712  NA 134*   < >  --  135 137 138 136 133* 135  K 3.5   < >  --  2.9* 3.6 2.9* 2.9* 3.6 3.4*  CL 98   < >  --  104 105 108 102 100 102  CO2 27   < >  --  22 21* 22 27 28 28   GLUCOSE 153*   < >  --  101* 98 119* 137* 212* 127*  BUN 13   < >  --  17 15 15 11 13 11   CREATININE 1.14   < >  --  0.84 0.84 0.75 0.85 0.99 0.84  CALCIUM  7.9*   < >  --  7.6* 7.5* 7.3* 7.3* 7.5* 7.4*  MG 1.9  --  1.9 1.9   --  2.1  --   --   --    < > = values in this interval not displayed.   GFR: Estimated Creatinine Clearance: 67.8 mL/min (by C-G formula based on SCr of 0.84 mg/dL). Liver Function Tests: No results for input(s): AST, ALT, ALKPHOS, BILITOT, PROT, ALBUMIN in the last 168 hours. No results for input(s): LIPASE, AMYLASE in the last 168 hours. No results for input(s): AMMONIA in the last 168 hours. Coagulation Profile: No results for input(s): INR, PROTIME in the last 168 hours. Cardiac Enzymes: No results for input(s): CKTOTAL, CKMB, CKMBINDEX, TROPONINI in the last 168 hours. BNP (last 3 results) Recent Labs    08/15/24 1242 09/06/24 1252  PROBNP 1,200.0* 2,376.0*   HbA1C: No results for input(s): HGBA1C in the last 72 hours. CBG: Recent Labs  Lab 09/12/24 1558 09/12/24 2006 09/13/24 0014 09/13/24 0413 09/13/24 0746  GLUCAP 191* 161* 113* 127* 133*   Lipid Profile: No results for input(s): CHOL, HDL, LDLCALC, TRIG, CHOLHDL, LDLDIRECT in the last 72 hours. Thyroid  Function Tests: No results for input(s): TSH, T4TOTAL, FREET4, T3FREE, THYROIDAB in the last 72 hours. Anemia Panel: No results for input(s): VITAMINB12, FOLATE, FERRITIN, TIBC, IRON, RETICCTPCT in the last 72 hours. Sepsis Labs: Recent Labs  Lab 09/07/24 1635  PROCALCITON 0.73  LATICACIDVEN 1.2    Recent Results (from the past 240 hours)  Resp panel by RT-PCR (RSV, Flu A&B, Covid) Anterior Nasal Swab     Status: None   Collection Time: 09/06/24  1:05 PM   Specimen: Anterior Nasal Swab  Result Value Ref Range Status   SARS Coronavirus 2 by RT PCR NEGATIVE NEGATIVE Final    Comment: (NOTE) SARS-CoV-2 target nucleic acids are NOT DETECTED.  The SARS-CoV-2 RNA is generally detectable in upper respiratory specimens during the acute phase of infection. The lowest concentration of SARS-CoV-2 viral copies this assay can detect is 138 copies/mL.  A negative result does not preclude SARS-Cov-2 infection and should not be used as the sole basis for treatment or other patient management decisions. A negative result may occur with  improper specimen collection/handling, submission of specimen other than nasopharyngeal swab, presence of viral mutation(s) within the areas targeted by this assay, and inadequate number of viral copies(<138 copies/mL). A negative result must be combined with clinical observations, patient history, and epidemiological information. The expected result is Negative.  Fact Sheet for Patients:  bloggercourse.com  Fact Sheet for Healthcare Providers:  seriousbroker.it  This test is no t yet approved or cleared by the [place]  FDA and  has been authorized for detection and/or diagnosis of SARS-CoV-2 by FDA under an Emergency Use Authorization (  EUA). This EUA will remain  in effect (meaning this test can be used) for the duration of the COVID-19 declaration under Section 564(b)(1) of the Act, 21 U.S.C.section 360bbb-3(b)(1), unless the authorization is terminated  or revoked sooner.       Influenza A by PCR NEGATIVE NEGATIVE Final   Influenza B by PCR NEGATIVE NEGATIVE Final    Comment: (NOTE) The Xpert Xpress SARS-CoV-2/FLU/RSV plus assay is intended as an aid in the diagnosis of influenza from Nasopharyngeal swab specimens and should not be used as a sole basis for treatment. Nasal washings and aspirates are unacceptable for Xpert Xpress SARS-CoV-2/FLU/RSV testing.  Fact Sheet for Patients: bloggercourse.com  Fact Sheet for Healthcare Providers: seriousbroker.it  This test is not yet approved or cleared by the [place]  FDA and has been authorized for detection and/or diagnosis of SARS-CoV-2 by FDA under an Emergency Use Authorization (EUA). This EUA will remain in effect (meaning this test  can be used) for the duration of the COVID-19 declaration under Section 564(b)(1) of the Act, 21 U.S.C. section 360bbb-3(b)(1), unless the authorization is terminated or revoked.     Resp Syncytial Virus by PCR NEGATIVE NEGATIVE Final    Comment: (NOTE) Fact Sheet for Patients: bloggercourse.com  Fact Sheet for Healthcare Providers: seriousbroker.it  This test is not yet approved or cleared by the [place]  FDA and has been authorized for detection and/or diagnosis of SARS-CoV-2 by FDA under an Emergency Use Authorization (EUA). This EUA will remain in effect (meaning this test can be used) for the duration of the COVID-19 declaration under Section 564(b)(1) of the Act, 21 U.S.C. section 360bbb-3(b)(1), unless the authorization is terminated or revoked.  Performed at [facility], [address], [place], [place] [address]   C Difficile Quick Screen w PCR reflex     Status: Abnormal   Collection Time: 09/07/24  6:36 AM   Specimen: STOOL  Result Value Ref Range Status   C Diff antigen POSITIVE (A) NEGATIVE Final   C Diff toxin POSITIVE (A) NEGATIVE Final   C Diff interpretation Toxin producing C. difficile detected.  Final    Comment: CRITICAL RESULT CALLED TO, READ BACK BY AND VERIFIED WITH: ENOLA PARAS RN AT 9268 ON 09/07/2024 BY PRUDY POUR Performed at [facility], [address] [place], [place] [address]   Gastrointestinal Panel by PCR , Stool     Status: None   Collection Time: 09/07/24  7:34 AM   Specimen: Stool  Result Value Ref Range Status   Campylobacter species NOT DETECTED NOT DETECTED Final   Plesimonas shigelloides NOT DETECTED NOT DETECTED Final   Salmonella species NOT DETECTED NOT DETECTED Final   Yersinia enterocolitica NOT DETECTED NOT DETECTED Final   Vibrio species NOT DETECTED NOT DETECTED Final   Vibrio cholerae NOT DETECTED NOT DETECTED Final    Enteroaggregative E coli (EAEC) NOT DETECTED NOT DETECTED Final   Enteropathogenic E coli (EPEC) NOT DETECTED NOT DETECTED Final   Enterotoxigenic E coli (ETEC) NOT DETECTED NOT DETECTED Final   Shiga like toxin producing E coli (STEC) NOT DETECTED NOT DETECTED Final   Shigella/Enteroinvasive E coli (EIEC) NOT DETECTED NOT DETECTED Final   Cryptosporidium NOT DETECTED NOT DETECTED Final   Cyclospora cayetanensis NOT DETECTED NOT DETECTED Final   Entamoeba histolytica NOT DETECTED NOT DETECTED Final   Giardia lamblia NOT DETECTED NOT DETECTED Final   Adenovirus F40/41 NOT DETECTED NOT DETECTED Final   Astrovirus NOT DETECTED NOT DETECTED Final  Norovirus GI/GII NOT DETECTED NOT DETECTED Final   Rotavirus A NOT DETECTED NOT DETECTED Final   Sapovirus (I, II, IV, and V) NOT DETECTED NOT DETECTED Final    Comment: Performed at [facility], [address] [place], [place] [address]  Culture, blood (Routine X 2) w Reflex to ID Panel     Status: None   Collection Time: 09/07/24  9:01 AM   Specimen: BLOOD  Result Value Ref Range Status   Specimen Description   Final    BLOOD BLOOD RIGHT ARM Performed at [facility], [address], [place], [place] [address]    Special Requests   Final    BOTTLES DRAWN AEROBIC AND ANAEROBIC Blood Culture results may not be optimal due to an inadequate volume of blood received in culture bottles Performed at [facility], [address] [place], [place] [address]    Culture   Final    NO GROWTH 5 DAYS Performed at [facility], [address], [place], [place] [address]    Report Status 09/12/2024 FINAL  Final  Culture, blood (Routine X 2) w Reflex to ID Panel     Status: None   Collection Time: 09/07/24  9:05 AM   Specimen: BLOOD  Result Value Ref Range Status   Specimen Description   Final    BLOOD BLOOD RIGHT ARM Performed at [facility], [address], [place],  [place] [address]    Special Requests   Final    BOTTLES DRAWN AEROBIC ONLY Blood Culture adequate volume Performed at [facility], [address] [place], [place] [address]    Culture   Final    NO GROWTH 5 DAYS Performed at [facility], [address], [place], [place] [address]    Report Status 09/12/2024 FINAL  Final  MRSA Next Gen by PCR, Nasal     Status: None   Collection Time: 09/07/24 11:29 AM   Specimen: Nasal Mucosa; Nasal Swab  Result Value Ref Range Status   MRSA by PCR Next Gen NOT DETECTED NOT DETECTED Final    Comment: (NOTE) The GeneXpert MRSA Assay (FDA approved for NASAL specimens only), is one component of a comprehensive MRSA colonization surveillance program. It is not intended to diagnose MRSA infection nor to guide or monitor treatment for MRSA infections. Test performance is not FDA approved in patients less than 57 years old. Performed at [facility], [address], [place], [place] [address]          Radiology Studies: No results found.    Scheduled Meds:  famotidine   20 mg Oral BID   feeding supplement  1 Container Oral TID BM   fiber supplement (BANATROL TF)  60 mL Oral BID   fidaxomicin   200 mg Oral BID   insulin  aspart  0-6 Units Subcutaneous Q4H   sodium chloride  flush  3 mL Intravenous Q12H   sucralfate   1 g Oral TID WC & HS   tamsulosin   0.4 mg Oral QPM   Continuous Infusions:   LOS: 7 days   Almarie KANDICE Hoots, MD 09/13/2024, 11:28 AM   "

## 2024-09-13 NOTE — Progress Notes (Addendum)
 SATURATION QUALIFICATIONS: (This note is used to comply with regulatory documentation for home oxygen)  Patient Saturations on Room Air at Rest = 100%  Patient Saturations on Room Air while Ambulating = 91%   [employer], OTR/L,CBIS  Supplemental OT - [employer] and [employer] Secure Chat Preferred

## 2024-09-13 NOTE — Plan of Care (Signed)

## 2024-09-13 NOTE — Progress Notes (Signed)
 Occupational Therapy Treatment Patient Details Name: Edward [name] MRN: [id number] DOB: 1941-05-14 Today's Date: 09/13/2024   History of present illness Edward [name] is an 84 yo male presents with significant fatigue, burning midsternal chest pain/indigestion, abdominal pain, vomiting and diarrhea, with chills; admitted with C. difficile colitis with underlying neutropenia. PMH: non-small cell lung cancer diagnosed in November 2025, and possible atypical carcinoid, CKD stage III, type 2 diabetes mellitus, hypertension, hyperlipidemia, NSVT, and BPH   OT comments  Pt sitting up in recliner upon therapy arrival. Completed walking O2 sats assessment while providing pt education on recommended levels. See note. Pt educated on safety with use of Rolator while navigating his environment. Pt verbalized understanding. Pt continues to be appropriate for home health OT.        If plan is discharge home, recommend the following:  Assist for transportation;Assistance with cooking/housework;A little help with bathing/dressing/bathroom;A little help with walking and/or transfers;Help with stairs or ramp for entrance;Supervision due to cognitive status         Precautions / Restrictions Precautions Precautions: Fall Recall of Precautions/Restrictions: Intact Restrictions Weight Bearing Restrictions Per Provider Order: No       Mobility Bed Mobility  General bed mobility comments: sitting up in recliner upon therapy arrival    Transfers Overall transfer level: Needs assistance Equipment used: Rolling walker (2 wheels) Transfers: Sit to/from Stand, Bed to chair/wheelchair/BSC Sit to Stand: Supervision     Step pivot transfers: Supervision     General transfer comment: No VC for hand placement provided during session.     Balance Overall balance assessment: Needs assistance Sitting-balance support: No upper extremity supported, Feet supported Sitting balance-Leahy Scale: Good      Standing balance support: No upper extremity supported, During functional activity Standing balance-Leahy Scale: Fair      ADL either performed or assessed with clinical judgement              Communication Communication Communication: No apparent difficulties   Cognition Arousal: Alert Behavior During Therapy: Flat affect Cognition: Cognition impaired     Awareness: Intellectual awareness intact, Online awareness intact Memory impairment (select all impairments): Short-term memory Attention impairment (select first level of impairment): Selective attention Executive functioning impairment (select all impairments): Organization      Following commands: Intact        Cueing   Cueing Techniques: Verbal cues        General Comments Pt completed 3 laps around room while utilizing RW to assess oxygen sats on RA. See walking O2 note in chart for details. Rollator delivered while working with pt. Provided education on safety during use including use of brakes and using a wall during sitting rest breaks to ensure rollator does not move during sit<>stand transitions. Pt verbalized understanding.    Pertinent Vitals/ Pain       Pain Assessment Pain Assessment: No/denies pain         Frequency  Min 2X/week        Progress Toward Goals  OT Goals(current goals can now be found in the care plan section)  Progress towards OT goals: Progressing toward goals  ADL Goals Additional ADL Goal #1: Pt will complete all self care tasks at mod I level utilizing energy conservation techniques as needed.  Plan         AM-PAC OT 6 Clicks Daily Activity     Outcome Measure   Help from another person eating meals?: None Help from another person taking care of personal grooming?:  None Help from another person toileting, which includes using toliet, bedpan, or urinal?: A Little Help from another person bathing (including washing, rinsing, drying)?: A Little Help from another  person to put on and taking off regular upper body clothing?: A Little Help from another person to put on and taking off regular lower body clothing?: A Little 6 Click Score: 20    End of Session Equipment Utilized During Treatment: Gait belt;Rolling walker (2 wheels)  OT Visit Diagnosis: Other abnormalities of gait and mobility (R26.89);Muscle weakness (generalized) (M62.81)   Activity Tolerance Patient tolerated treatment well   Patient Left with call bell/phone within reach;in bed;with bed alarm set   Nurse Communication Other (comment) (oxygen sats)        Time: 8863-8787 OT Time Calculation (min): 36 min  Charges: OT General Charges $OT Visit: 1 Visit OT Treatments $Therapeutic Activity: 23-37 mins  Leita Howell, OTR/L,CBIS  Supplemental OT - [facility] and [facility] Secure Chat Preferred    Jailin Moomaw, Leita BIRCH 09/13/2024, 1:43 PM

## 2024-09-13 NOTE — TOC Initial Note (Addendum)
 Transition of Care [facility]) - Initial/Assessment Note    Patient Details  Name: Edward [name] MRN: [id number] Date of Birth: 01/07/1941  [facility] CM/SW Contact:    Toy LITTIE Agar, RN Phone Number:[phone]  09/13/2024, 8:52 AM  Clinical Narrative:                 Inpatient care manager following patient with high risk for readmission. Patient is from home with spouse. Per spouse patient is normally independent and very active. Patient is actively on chemo and radiation per wife and the goal is that his strength will get better. Spouse is open to home PT for now . CM provided spouse with list of accepting home health agencies. Wife has no preference. Referral has been accepted by [facility]   1123 Patient has recommendation for rollator. Patient has no preference for DME provider. Rollator has been ordered per [employer]. To be delivered to bedside today with the understanding that patient will not d/c today.   Expected Discharge Plan: Home/Self Care Barriers to Discharge: No Barriers Identified, Continued Medical Work up   Patient Goals and CMS Choice Patient states their goals for this hospitalization and ongoing recovery are:: To feel better and return home CMS Medicare.gov Compare Post Acute Care list provided to:: Patient Represenative (must comment)        Expected Discharge Plan and Services   Discharge Planning Services: CM Consult Post Acute Care Choice: Home Health Living arrangements for the past 2 months: Single Family Home                                      Prior Living Arrangements/Services Living arrangements for the past 2 months: Single Family Home Lives with:: Spouse Patient language and need for interpreter reviewed:: Yes Do you feel safe going back to the place where you live?: Yes      Need for Family Participation in Patient Care: Yes (Comment) Care giver support system in place?: Yes (comment)   Criminal Activity/Legal Involvement  Pertinent to Current Situation/Hospitalization: No - Comment as needed  Activities of Daily Living   ADL Screening (condition at time of admission) Independently performs ADLs?: Yes (appropriate for developmental age) Is the patient deaf or have difficulty hearing?: No Does the patient have difficulty seeing, even when wearing glasses/contacts?: No Does the patient have difficulty concentrating, remembering, or making decisions?: No  Permission Sought/Granted Permission sought to share information with : Family Supports Permission granted to share information with : Yes, Verbal Permission Granted  Share Information with NAME: Cormick Moss  [phone]     Permission granted to share info w Relationship: spouse  Permission granted to share info w Contact Information: [phone]  Emotional Assessment Appearance:: Appears stated age Attitude/Demeanor/Rapport: Gracious Affect (typically observed): Pleasant Orientation: : Oriented to Self, Oriented to Place, Oriented to  Time, Oriented to Situation Alcohol  / Substance Use: Not Applicable Psych Involvement: No (comment)  Admission diagnosis:  Neutropenic [D70.9] Malignant neoplasm of lung, unspecified laterality, unspecified part of lung (HCC) [C34.90] Neutropenia, unspecified type [D70.9] Patient Active Problem List   Diagnosis Date Noted   Sepsis (HCC) 09/07/2024   C. difficile colitis 09/07/2024   Malignant neoplasm of lung (HCC) 09/07/2024   Neutropenic 09/06/2024   Chemotherapy induced neutropenia 09/04/2024   Obstructive pneumonia 08/15/2024   Acute hypoxic respiratory failure (HCC) 08/15/2024   Primary malignant neuroendocrine neoplasm of lung (HCC) 08/05/2024  Lung nodule 07/25/2024   Rib pain on right side 06/27/2024   Muscle spasm 06/27/2024   Right lower lobe lung mass 06/27/2024   Allergic rhinoconjunctivitis 06/27/2024   Type 2 diabetes with circulatory disorder causing erectile dysfunction (HCC) 06/27/2024    Encounter for general adult medical examination with abnormal findings 03/12/2024   Erectile dysfunction due to type 2 diabetes mellitus (HCC) 04/09/2021   Former smoker 09/02/2018   FHx: heart disease 09/02/2018   CKD stage 3 due to type 2 diabetes mellitus (HCC) 11/02/2017   Type 2 diabetes mellitus with stage 3 chronic kidney disease, without long-term current use of insulin  (HCC) 11/01/2014   Overweight (BMI 25.0-29.9) 07/25/2014   Hyperlipidemia associated with type 2 diabetes mellitus (HCC)    Gastroesophageal reflux disease    Essential hypertension    Vitamin D  deficiency    BPH with obstruction/lower urinary tract symptoms    PCP:  [name], NP-C Pharmacy:   [facility] [address] [place] [place] [address] Phone: [phone] Fax: [phone]     Social Drivers of Health (SDOH) Social History: SDOH Screenings   Food Insecurity: No Food Insecurity (09/06/2024)  Housing: Low Risk (09/06/2024)  Transportation Needs: No Transportation Needs (09/06/2024)  Utilities: Not At Risk (09/06/2024)  Alcohol  Screen: Low Risk (01/09/2024)  Depression (PHQ2-9): Low Risk (08/28/2024)  Financial Resource Strain: Low Risk (01/09/2024)  Physical Activity: Sufficiently Active (01/09/2024)  Social Connections: Socially Integrated (09/06/2024)  Stress: No Stress Concern Present (01/09/2024)  Tobacco Use: Medium Risk (09/10/2024)  Health Literacy: Adequate Health Literacy (01/09/2024)   SDOH Interventions:     Readmission Risk Interventions    09/13/2024    8:45 AM  Readmission Risk Prevention Plan  Transportation Screening Complete  PCP or Specialist Appt within 3-5 Days Complete  HRI or Home Care Consult Complete  Social Work Consult for Recovery Care Planning/Counseling Complete  Palliative Care Screening Complete  Medication Review Oceanographer) Complete

## 2024-09-14 DIAGNOSIS — D701 Agranulocytosis secondary to cancer chemotherapy: Secondary | ICD-10-CM | POA: Diagnosis not present

## 2024-09-14 DIAGNOSIS — T451X5A Adverse effect of antineoplastic and immunosuppressive drugs, initial encounter: Secondary | ICD-10-CM | POA: Diagnosis not present

## 2024-09-14 LAB — BASIC METABOLIC PANEL WITH GFR
Anion gap: 8 (ref 5–15)
BUN: 16 mg/dL (ref 8–23)
CO2: 26 mmol/L (ref 22–32)
Calcium: 7.9 mg/dL — ABNORMAL LOW (ref 8.9–10.3)
Chloride: 101 mmol/L (ref 98–111)
Creatinine, Ser: 1 mg/dL (ref 0.61–1.24)
GFR, Estimated: >60 mL/min
Glucose, Bld: 183 mg/dL — ABNORMAL HIGH (ref 70–99)
Potassium: 3.9 mmol/L (ref 3.5–5.1)
Sodium: 135 mmol/L (ref 135–145)

## 2024-09-14 LAB — CBC
HCT: 36.1 % — ABNORMAL LOW (ref 39.0–52.0)
Hemoglobin: 12 g/dL — ABNORMAL LOW (ref 13.0–17.0)
MCH: 28.4 pg (ref 26.0–34.0)
MCHC: 33.2 g/dL (ref 30.0–36.0)
MCV: 85.5 fL (ref 80.0–100.0)
Platelets: 80 K/uL — ABNORMAL LOW (ref 150–400)
RBC: 4.22 MIL/uL (ref 4.22–5.81)
RDW: 13.8 % (ref 11.5–15.5)
WBC: 3.9 K/uL — ABNORMAL LOW (ref 4.0–10.5)
nRBC: 0 % (ref 0.0–0.2)

## 2024-09-14 LAB — ACID FAST CULTURE WITH REFLEXED SENSITIVITIES (MYCOBACTERIA): Acid Fast Culture: NEGATIVE

## 2024-09-14 LAB — GLUCOSE, CAPILLARY
Glucose-Capillary: 123 mg/dL — ABNORMAL HIGH (ref 70–99)
Glucose-Capillary: 123 mg/dL — ABNORMAL HIGH (ref 70–99)
Glucose-Capillary: 124 mg/dL — ABNORMAL HIGH (ref 70–99)
Glucose-Capillary: 180 mg/dL — ABNORMAL HIGH (ref 70–99)
Glucose-Capillary: 203 mg/dL — ABNORMAL HIGH (ref 70–99)
Glucose-Capillary: 201 mg/dL — ABNORMAL HIGH (ref 70–99)

## 2024-09-14 MED ORDER — SODIUM CHLORIDE 0.9 % IV SOLN: INTRAVENOUS | Status: AC

## 2024-09-14 NOTE — Progress Notes (Signed)
 " PROGRESS NOTE    Edward [name]  FMW:[id number] DOB: 12/09/1940 DOA: 09/06/2024 PCP: [name], NP-C  Brief Narrative:  84 y.o. 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  Patient's first chemotherapy dose was on December 16/2025, also undergoing radiation, with last day being on 10/03/2024.   Patient was recently seen at the cancer center on 12/24, noted to be neutropenic secondary to his chemo and received G-CSF injection.  On 12/26 presented back to the cancer center for his second dose of G-CSF, due to his persistent symptoms, was sent to the ED for further management.   In the ED, vital signs fairly stable, labs show pancytopenia including severe neutropenia. CTA of his chest revealed right infahilar mass, unchanged right pleural effusion and right middle lobe consolidation, slightly decreased right lower lobe airspace disease all compared to his last CT.  patient was started on IV antibiotics and admitted for further management. 09/11/2024 patient denies having any diarrhea however he has very poor appetite has not been eating or drinking at all.  He feels no taste in his mouth.  Denies nausea vomiting.   Assessment & Plan:   Principal Problem:   Neutropenic Active Problems:   Obstructive pneumonia   Sepsis (HCC)   C. difficile colitis   Malignant neoplasm of lung (HCC)  C. difficile colitis with underlying neutropenia Nausea/vomiting resolved diarrhea and vomiting however appetite remains low.   Currently afebrile, with resolving neutropenia BC x 2 NGTD, MRSA PCR negative Stool sample  positive for C. Difficile positive antigen and positive toxin with diarrhea on admission  GIP panel negative Lactic acid WNL DG abdomen no SBO, or megacolon ID on board, signed off 09/09/2024 continue p.o. Dificid  last dose September 17, 2024  Discontinued systemic IV antibiotics after 48 hours given C. difficile infection Encourage PO Neutropenic and enteric precaution Telemetry, monitor closely   Epigastric pain Possible esophageal dysphagia/radiation-induced Reports burning in nature Esophagram showed nonspecific esophageal dysmotility disorder SLP consulted, appreciate recs Continue famotidine  IV (hold PPI in the setting of C. Difficile), Carafate  suspension Outpatient follow-up with GI   Pancytopenia Likely 2/2 chemotherapy, with improved neutropenia S/p filgrastim , with improvement in ANC Daily CBC, neutropenic precautions Oncology consulted Daily CBC pending   Hypokalemia k 3.4 repleted Today's labs pending   Acute hypoxic respiratory failure Possible acute on chronic systolic and diastolic HF/volume overload Likely from aggressive fluid resuscitation Requiring about 2 L of O2 to maintain sats in the 90s, visibly SOB ProBNP elevated on admission Initial CXR, no congestion, repeat chest x-ray 09/10/2024 interstitial pulmonary edema mild increase in the size of a small to moderate right pleural effusion Echo done 08/2024 showed EF of 40 to 45%, no regional wall motion abnormalities, grade 1 diastolic dysfunction He was given Lasix  first dose on 30 December Pressure on the soft side 107/69 He was taken off of oxygen on 09/13/2024 as his saturations remained above 92%.  He did not require any further diuresis.  Possible postobstructive pneumonia/radiation induced pneumonitis ??Aspiration pneumonia Pro-Cal 0.73 CTA of his chest revealed right infahilar mass, unchanged right pleural effusion and right middle lobe consolidation, slightly decreased right lower lobe airspace  disease all compared  to his last CT Discontinue IV Vanco, cefepime  x 48 hours SLP consulted DuoNebs, supplemental O2 as needed   Mildly elevated troponin Multiple PVCs Troponin 33-36, flat trend, denies any left-sided pain, trace pedal edema EKG with no acute ST changes, noted PVCs, old left bundle branch block Follows with cardiology/EP, recommended on previous admission metoprolol  12.5 mg twice daily, currently on hold due to soft BP/intermittent bradycardia, may resume pending vitals due to noted PVCs Telemetry   Diabetes mellitus type 2 Last A1c 7 on 06/21/2024 SSI, Accu-Cheks, hypoglycemic protocol CBG (last 3)  Recent Labs    09/14/24 0426 09/14/24 0753 09/14/24 1117  GLUCAP 123* 124* 180*      Non-small cell lung cancer/neuroendocrine tumor Diagnosed in November 2025 Currently on chemoradiation, followed by Dr. Gatha, added to the treatment team First chemotherapy dose was on December 16/2025, also undergoing radiation, with last day being on 10/07/2024 16 radiations pending as of 09/13/2024 Oncology consulted   Goals of care discussion Overall very poor prognosis given above Palliative/hospice consulted, appreciate recs Seen by physical therapy recommending home with home health when patient ready to be discharged.     Nutrition Problem: Increased nutrient needs Etiology: cancer and cancer related treatments     Signs/Symptoms: estimated needs    Interventions: Boost Breeze, Refer to RD note for recommendations  Estimated body mass index is 23.11 kg/m as calculated from the following:   Height as of this encounter: 5' 9 (1.753 m).   Weight as of this encounter: 71 kg.  DVT prophylaxis:  scd Code Status: dnr Family Communication:none Disposition Plan:  Status is: Inpatient Remains inpatient appropriate because: Acute illness   Consultants:  ID oncology and palliative  Procedures: None Antimicrobials: Dificid   Subjective:  Had 1 loose  stool yesterday tolerated radiation therapy yesterday remains weak eating a little better he likes to eat breakfast more than any other meals  objective: Vitals:   09/12/24 2006 09/13/24 0416 09/13/24 1942 09/14/24 0425  BP: 111/66 126/72 (!) 114/56 (!) 97/59  Pulse: (!) 49 89 93 67  Resp: 18 18 18 18   Temp: 98.4 F (36.9 C) 98.1 F (36.7 C) 98.8 F (37.1 C) 98.1 F (36.7 C)  TempSrc: Oral Oral Oral Oral  SpO2: 98% 97% 93% 96%  Weight:      Height:        Intake/Output Summary (Last 24 hours) at 09/14/2024 1156 Last data filed at 09/14/2024 9147 Gross per 24 hour  Intake --  Output 375 ml  Net -375 ml   Filed Weights   09/06/24 1247  Weight: 71 kg    Examination:  General exam: Appears frail and chronically ill-appearing Respiratory system: Clear to auscultation. Respiratory effort normal. Cardiovascular system:reg Gastrointestinal system: soft nt Central nervous system: Alert and oriented.  Extremities: no edema  Data Reviewed: I have personally reviewed following labs and imaging studies  CBC: Recent Labs  Lab 09/08/24 0644 09/08/24 2204 09/09/24 0620 09/10/24 0700 09/11/24 0500 09/12/24 1221 09/13/24 0712  WBC 0.2*   < > 0.7* 2.3* 1.8* 2.2* 2.5*  NEUTROABS 0.1*  --  0.5* 1.9 1.6*  --   --   HGB 10.3*   < > 9.7* 9.5* 10.3* 10.5* 10.5*  HCT 31.5*   < > 29.5* 28.5* 31.1* 32.4* 32.2*  MCV 87.0   < > 87.5 86.9 87.1 87.3 86.6  PLT 22*   < > 19* 24* 33* 41* 45*   < > = values in this interval not displayed.  Basic Metabolic Panel: Recent Labs  Lab 09/08/24 0654 09/08/24 2204 09/09/24 0620 09/10/24 0700 09/11/24 0500 09/12/24 1221 09/13/24 0712  NA  --  135 137 138 136 133* 135  K  --  2.9* 3.6 2.9* 2.9* 3.6 3.4*  CL  --  104 105 108 102 100 102  CO2  --  22 21* 22 27 28 28   GLUCOSE  --  101* 98 119* 137* 212* 127*  BUN  --  17 15 15 11 13 11   CREATININE  --  0.84 0.84 0.75 0.85 0.99 0.84  CALCIUM   --  7.6* 7.5* 7.3* 7.3* 7.5* 7.4*  MG 1.9 1.9  --   2.1  --   --   --    GFR: Estimated Creatinine Clearance: 67.8 mL/min (by C-G formula based on SCr of 0.84 mg/dL). Liver Function Tests: No results for input(s): AST, ALT, ALKPHOS, BILITOT, PROT, ALBUMIN in the last 168 hours. No results for input(s): LIPASE, AMYLASE in the last 168 hours. No results for input(s): AMMONIA in the last 168 hours. Coagulation Profile: No results for input(s): INR, PROTIME in the last 168 hours. Cardiac Enzymes: No results for input(s): CKTOTAL, CKMB, CKMBINDEX, TROPONINI in the last 168 hours. BNP (last 3 results) Recent Labs    08/15/24 1242 09/06/24 1252  PROBNP 1,200.0* 2,376.0*   HbA1C: No results for input(s): HGBA1C in the last 72 hours. CBG: Recent Labs  Lab 09/13/24 1943 09/14/24 0059 09/14/24 0426 09/14/24 0753 09/14/24 1117  GLUCAP 135* 123* 123* 124* 180*   Lipid Profile: No results for input(s): CHOL, HDL, LDLCALC, TRIG, CHOLHDL, LDLDIRECT in the last 72 hours. Thyroid  Function Tests: No results for input(s): TSH, T4TOTAL, FREET4, T3FREE, THYROIDAB in the last 72 hours. Anemia Panel: No results for input(s): VITAMINB12, FOLATE, FERRITIN, TIBC, IRON, RETICCTPCT in the last 72 hours. Sepsis Labs: Recent Labs  Lab 09/07/24 1635  PROCALCITON 0.73  LATICACIDVEN 1.2    Recent Results (from the past 240 hours)  Resp panel by RT-PCR (RSV, Flu A&B, Covid) Anterior Nasal Swab     Status: None   Collection Time: 09/06/24  1:05 PM   Specimen: Anterior Nasal Swab  Result Value Ref Range Status   SARS Coronavirus 2 by RT PCR NEGATIVE NEGATIVE Final    Comment: (NOTE) SARS-CoV-2 target nucleic acids are NOT DETECTED.  The SARS-CoV-2 RNA is generally detectable in upper respiratory specimens during the acute phase of infection. The lowest concentration of SARS-CoV-2 viral copies this assay can detect is 138 copies/mL. A negative result does not preclude  SARS-Cov-2 infection and should not be used as the sole basis for treatment or other patient management decisions. A negative result may occur with  improper specimen collection/handling, submission of specimen other than nasopharyngeal swab, presence of viral mutation(s) within the areas targeted by this assay, and inadequate number of viral copies(<138 copies/mL). A negative result must be combined with clinical observations, patient history, and epidemiological information. The expected result is Negative.  Fact Sheet for Patients:  bloggercourse.com  Fact Sheet for Healthcare Providers:  seriousbroker.it  This test is no t yet approved or cleared by the [place]  FDA and  has been authorized for detection and/or diagnosis of SARS-CoV-2 by FDA under an Emergency Use Authorization (EUA). This EUA will remain  in effect (meaning this test can be used) for the duration of the COVID-19 declaration under Section 564(b)(1) of the Act, 21 U.S.C.section 360bbb-3(b)(1), unless the authorization is terminated  or revoked sooner.  Influenza A by PCR NEGATIVE NEGATIVE Final   Influenza B by PCR NEGATIVE NEGATIVE Final    Comment: (NOTE) The Xpert Xpress SARS-CoV-2/FLU/RSV plus assay is intended as an aid in the diagnosis of influenza from Nasopharyngeal swab specimens and should not be used as a sole basis for treatment. Nasal washings and aspirates are unacceptable for Xpert Xpress SARS-CoV-2/FLU/RSV testing.  Fact Sheet for Patients: bloggercourse.com  Fact Sheet for Healthcare Providers: seriousbroker.it  This test is not yet approved or cleared by the [place]  FDA and has been authorized for detection and/or diagnosis of SARS-CoV-2 by FDA under an Emergency Use Authorization (EUA). This EUA will remain in effect (meaning this test can be used) for the duration of  the COVID-19 declaration under Section 564(b)(1) of the Act, 21 U.S.C. section 360bbb-3(b)(1), unless the authorization is terminated or revoked.     Resp Syncytial Virus by PCR NEGATIVE NEGATIVE Final    Comment: (NOTE) Fact Sheet for Patients: bloggercourse.com  Fact Sheet for Healthcare Providers: seriousbroker.it  This test is not yet approved or cleared by the [place]  FDA and has been authorized for detection and/or diagnosis of SARS-CoV-2 by FDA under an Emergency Use Authorization (EUA). This EUA will remain in effect (meaning this test can be used) for the duration of the COVID-19 declaration under Section 564(b)(1) of the Act, 21 U.S.C. section 360bbb-3(b)(1), unless the authorization is terminated or revoked.  Performed at [facility], [address], [place], [place] [address]   C Difficile Quick Screen w PCR reflex     Status: Abnormal   Collection Time: 09/07/24  6:36 AM   Specimen: STOOL  Result Value Ref Range Status   C Diff antigen POSITIVE (A) NEGATIVE Final   C Diff toxin POSITIVE (A) NEGATIVE Final   C Diff interpretation Toxin producing C. difficile detected.  Final    Comment: CRITICAL RESULT CALLED TO, READ BACK BY AND VERIFIED WITH: ENOLA PARAS RN AT 9268 ON 09/07/2024 BY PRUDY POUR Performed at [facility], [address], [place], [place] [address]   Gastrointestinal Panel by PCR , Stool     Status: None   Collection Time: 09/07/24  7:34 AM   Specimen: Stool  Result Value Ref Range Status   Campylobacter species NOT DETECTED NOT DETECTED Final   Plesimonas shigelloides NOT DETECTED NOT DETECTED Final   Salmonella species NOT DETECTED NOT DETECTED Final   Yersinia enterocolitica NOT DETECTED NOT DETECTED Final   Vibrio species NOT DETECTED NOT DETECTED Final   Vibrio cholerae NOT DETECTED NOT DETECTED Final   Enteroaggregative E coli (EAEC) NOT DETECTED  NOT DETECTED Final   Enteropathogenic E coli (EPEC) NOT DETECTED NOT DETECTED Final   Enterotoxigenic E coli (ETEC) NOT DETECTED NOT DETECTED Final   Shiga like toxin producing E coli (STEC) NOT DETECTED NOT DETECTED Final   Shigella/Enteroinvasive E coli (EIEC) NOT DETECTED NOT DETECTED Final   Cryptosporidium NOT DETECTED NOT DETECTED Final   Cyclospora cayetanensis NOT DETECTED NOT DETECTED Final   Entamoeba histolytica NOT DETECTED NOT DETECTED Final   Giardia lamblia NOT DETECTED NOT DETECTED Final   Adenovirus F40/41 NOT DETECTED NOT DETECTED Final   Astrovirus NOT DETECTED NOT DETECTED Final   Norovirus GI/GII NOT DETECTED NOT DETECTED Final   Rotavirus A NOT DETECTED NOT DETECTED Final   Sapovirus (I, II, IV, and V) NOT DETECTED NOT DETECTED Final    Comment: Performed at [facility], [address] [place], [place] [address]  Culture, blood (Routine X 2) w Reflex to ID Panel     Status: None   Collection Time: 09/07/24  9:01 AM   Specimen: BLOOD  Result Value Ref Range Status   Specimen Description   Final    BLOOD BLOOD RIGHT ARM Performed at [facility], [address] [place], [place] [address]    Special Requests   Final    BOTTLES DRAWN AEROBIC AND ANAEROBIC Blood Culture results may not be optimal due to an inadequate volume of blood received in culture bottles Performed at [facility], [address], [place], [place] [address]    Culture   Final    NO GROWTH 5 DAYS Performed at [facility], [address], [place], [place] [address]    Report Status 09/12/2024 FINAL  Final  Culture, blood (Routine X 2) w Reflex to ID Panel     Status: None   Collection Time: 09/07/24  9:05 AM   Specimen: BLOOD  Result Value Ref Range Status   Specimen Description   Final    BLOOD BLOOD RIGHT ARM Performed at [facility], [address] [place], [place] [address]    Special Requests   Final     BOTTLES DRAWN AEROBIC ONLY Blood Culture adequate volume Performed at [facility], [address], [place], [place] [address]    Culture   Final    NO GROWTH 5 DAYS Performed at [facility], [address], [place], [place] [address]    Report Status 09/12/2024 FINAL  Final  MRSA Next Gen by PCR, Nasal     Status: None   Collection Time: 09/07/24 11:29 AM   Specimen: Nasal Mucosa; Nasal Swab  Result Value Ref Range Status   MRSA by PCR Next Gen NOT DETECTED NOT DETECTED Final    Comment: (NOTE) The GeneXpert MRSA Assay (FDA approved for NASAL specimens only), is one component of a comprehensive MRSA colonization surveillance program. It is not intended to diagnose MRSA infection nor to guide or monitor treatment for MRSA infections. Test performance is not FDA approved in patients less than 60 years old. Performed at [facility], [address] [place], [place] [address]     Radiology Studies: No results found.    Scheduled Meds:  famotidine   20 mg Oral BID   feeding supplement  1 Container Oral TID BM   fiber supplement (BANATROL TF)  60 mL Oral BID   fidaxomicin   200 mg Oral BID   insulin  aspart  0-6 Units Subcutaneous Q4H   potassium chloride   20 mEq Oral Daily   sodium chloride  flush  3 mL Intravenous Q12H   sucralfate   1 g Oral TID WC & HS   tamsulosin   0.4 mg Oral QPM   Continuous Infusions:   LOS: 8 days   Almarie KANDICE Hoots, MD 09/14/2024, 11:56 AM   "

## 2024-09-14 NOTE — Plan of Care (Addendum)
 1500: Wife at the bedside requesting update on plan of care, Vital signs, laboratory results, and plan of care went over with wife and patient at the bedside. Wife requesting to speak to MD & messaged sent. Pt stable, in recliner, and walking with one assist to bathroom. VSS, IVF running, pt comfortable.  Problem: Education: Goal: Knowledge of General Education information will improve Description: Including pain rating scale, medication(s)/side effects and non-pharmacologic comfort measures Outcome: Progressing   Problem: Health Behavior/Discharge Planning: Goal: Ability to manage health-related needs will improve Outcome: Progressing   Problem: Clinical Measurements: Goal: Ability to maintain clinical measurements within normal limits will improve Outcome: Progressing Goal: Will remain free from infection Outcome: Progressing Goal: Diagnostic test results will improve Outcome: Progressing Goal: Respiratory complications will improve Outcome: Progressing Goal: Cardiovascular complication will be avoided Outcome: Progressing   Problem: Activity: Goal: Risk for activity intolerance will decrease Outcome: Progressing   Problem: Nutrition: Goal: Adequate nutrition will be maintained Outcome: Progressing   Problem: Coping: Goal: Level of anxiety will decrease Outcome: Progressing   Problem: Elimination: Goal: Will not experience complications related to bowel motility Outcome: Progressing Goal: Will not experience complications related to urinary retention Outcome: Progressing   Problem: Pain Managment: Goal: General experience of comfort will improve and/or be controlled Outcome: Progressing   Problem: Safety: Goal: Ability to remain free from injury will improve Outcome: Progressing   Problem: Skin Integrity: Goal: Risk for impaired skin integrity will decrease Outcome: Progressing   Problem: Education: Goal: Ability to describe self-care measures that may prevent  or decrease complications (Diabetes Survival Skills Education) will improve Outcome: Progressing Goal: Individualized Educational Video(s) Outcome: Progressing   Problem: Coping: Goal: Ability to adjust to condition or change in health will improve Outcome: Progressing   Problem: Fluid Volume: Goal: Ability to maintain a balanced intake and output will improve Outcome: Progressing   Problem: Health Behavior/Discharge Planning: Goal: Ability to identify and utilize available resources and services will improve Outcome: Progressing Goal: Ability to manage health-related needs will improve Outcome: Progressing   Problem: Metabolic: Goal: Ability to maintain appropriate glucose levels will improve Outcome: Progressing   Problem: Nutritional: Goal: Maintenance of adequate nutrition will improve Outcome: Progressing Goal: Progress toward achieving an optimal weight will improve Outcome: Progressing   Problem: Skin Integrity: Goal: Risk for impaired skin integrity will decrease Outcome: Progressing   Problem: Tissue Perfusion: Goal: Adequacy of tissue perfusion will improve Outcome: Progressing

## 2024-09-15 DIAGNOSIS — D709 Neutropenia, unspecified: Secondary | ICD-10-CM | POA: Diagnosis not present

## 2024-09-15 LAB — BASIC METABOLIC PANEL WITH GFR
Anion gap: 6 (ref 5–15)
BUN: 13 mg/dL (ref 8–23)
CO2: 27 mmol/L (ref 22–32)
Calcium: 7.7 mg/dL — ABNORMAL LOW (ref 8.9–10.3)
Chloride: 105 mmol/L (ref 98–111)
Creatinine, Ser: 0.98 mg/dL (ref 0.61–1.24)
GFR, Estimated: >60 mL/min
Glucose, Bld: 128 mg/dL — ABNORMAL HIGH (ref 70–99)
Potassium: 3.7 mmol/L (ref 3.5–5.1)
Sodium: 137 mmol/L (ref 135–145)

## 2024-09-15 LAB — CBC
HCT: 30.1 % — ABNORMAL LOW (ref 39.0–52.0)
Hemoglobin: 9.8 g/dL — ABNORMAL LOW (ref 13.0–17.0)
MCH: 28.2 pg (ref 26.0–34.0)
MCHC: 32.6 g/dL (ref 30.0–36.0)
MCV: 86.7 fL (ref 80.0–100.0)
Platelets: 79 K/uL — ABNORMAL LOW (ref 150–400)
RBC: 3.47 MIL/uL — ABNORMAL LOW (ref 4.22–5.81)
RDW: 14 % (ref 11.5–15.5)
WBC: 3.2 K/uL — ABNORMAL LOW (ref 4.0–10.5)
nRBC: 0 % (ref 0.0–0.2)

## 2024-09-15 LAB — GLUCOSE, CAPILLARY
Glucose-Capillary: 119 mg/dL — ABNORMAL HIGH (ref 70–99)
Glucose-Capillary: 121 mg/dL — ABNORMAL HIGH (ref 70–99)
Glucose-Capillary: 128 mg/dL — ABNORMAL HIGH (ref 70–99)
Glucose-Capillary: 129 mg/dL — ABNORMAL HIGH (ref 70–99)
Glucose-Capillary: 129 mg/dL — ABNORMAL HIGH (ref 70–99)
Glucose-Capillary: 242 mg/dL — ABNORMAL HIGH (ref 70–99)

## 2024-09-15 MED ORDER — SODIUM CHLORIDE 0.9 % IV SOLN: INTRAVENOUS | Status: AC

## 2024-09-15 MED ORDER — POTASSIUM CHLORIDE CRYS ER 20 MEQ PO TBCR
20.0000 meq | EXTENDED_RELEASE_TABLET | Freq: Once | ORAL | Status: AC
  Administered 2024-09-15: 20 meq via ORAL
  Filled 2024-09-15: qty 1

## 2024-09-15 NOTE — Plan of Care (Signed)

## 2024-09-15 NOTE — Progress Notes (Signed)
 "                                                                                                                                                                                                          Daily Progress Note   Patient Name: Edward [name]       Date: 09/15/2024 DOB: [birth year]  Age: 84 y.o. MRN#: [id number] Attending Physician: Will Almarie MATSU, MD Primary Care Physician: [name], NP-C Admit Date: 09/06/2024  Reason for Consultation/Follow-up: Establishing goals of care  Patient Profile/HPI:  Edward [name] is a 84 y.o. male with medical history significant for non-small cell lung cancer diagnosed in November 2025 (started chemotherapy in December and is currently on radiation), and possible atypical carcinoid, CKD stage III, type 2 diabetes mellitus, hypertension, hyperlipidemia, NSVT, and BPH, presents with significant fatigue, burning midsternal chest pain/indigestion, abdominal pain, vomiting and diarrhea, with chills that has been ongoing since the past few weeks, but worsened over the past couple of days.  He was admitted and being treated for neutropenia, C. Diff, and pneumonia. Palliative care has been asked to support additional goals of care conversations.   Subjective: Chart reviewed including labs, progress notes, imaging from this and previous encounters.  Discussed with Dr. Will.  WBC is 3.2 today. He is on room air.   He reports feeling better and is eager to be discharged home.  His spouse is at bedside- answered her questions about discharge plans.  He is hoping to receive PT at home. He wishes to continue treatments for his cancer- plans to discuss with Dr. Sherrod.  I recommended followup with Palliative medicine at [facility] and patient and his spouse are in agreement.   Review of Systems  Constitutional: Negative.      Physical Exam Vitals and nursing note reviewed.  Constitutional:      General: He is not in acute  distress. Cardiovascular:     Rate and Rhythm: Normal rate.  Pulmonary:     Effort: Pulmonary effort is normal.  Neurological:     Mental Status: He is alert and oriented to person, place, and time.             Vital Signs: BP (!) 114/55 (BP Location: Right Arm)   Pulse 73   Temp 98.5 F (36.9 C) (Oral)   Resp 18   Ht 5' 9 (1.753 m)   Wt 71 kg   SpO2 97%   BMI 23.11 kg/m  SpO2: SpO2: 97 % O2 Device: O2 Device: Room Air O2 Flow Rate:  O2 Flow Rate (L/min): 2 L/min  Intake/output summary:  Intake/Output Summary (Last 24 hours) at 09/15/2024 1418 Last data filed at 09/15/2024 1304 Gross per 24 hour  Intake 2444.32 ml  Output 600 ml  Net 1844.32 ml   LBM: Last BM Date : 09/14/24 Baseline Weight: Weight: 71 kg Most recent weight: Weight: 71 kg       Palliative Assessment/Data: PPS: 50%      Patient Active Problem List   Diagnosis Date Noted   Sepsis (HCC) 09/07/2024   C. difficile colitis 09/07/2024   Malignant neoplasm of lung (HCC) 09/07/2024   Neutropenic 09/06/2024   Chemotherapy induced neutropenia 09/04/2024   Obstructive pneumonia 08/15/2024   Acute hypoxic respiratory failure (HCC) 08/15/2024   Primary malignant neuroendocrine neoplasm of lung (HCC) 08/05/2024   Lung nodule 07/25/2024   Rib pain on right side 06/27/2024   Muscle spasm 06/27/2024   Right lower lobe lung mass 06/27/2024   Allergic rhinoconjunctivitis 06/27/2024   Type 2 diabetes with circulatory disorder causing erectile dysfunction (HCC) 06/27/2024   Encounter for general adult medical examination with abnormal findings 03/12/2024   Erectile dysfunction due to type 2 diabetes mellitus (HCC) 04/09/2021   Former smoker 09/02/2018   FHx: heart disease 09/02/2018   CKD stage 3 due to type 2 diabetes mellitus (HCC) 11/02/2017   Type 2 diabetes mellitus with stage 3 chronic kidney disease, without long-term current use of insulin  (HCC) 11/01/2014   Overweight (BMI 25.0-29.9) 07/25/2014    Hyperlipidemia associated with type 2 diabetes mellitus (HCC)    Gastroesophageal reflux disease    Essential hypertension    Vitamin D  deficiency    BPH with obstruction/lower urinary tract symptoms     Palliative Care Assessment & Plan    Assessment/Recommendations/Plan  Pneumonia and C. Diff in the setting of neutropenia related to chemotherapy for non small cell lung cancer- continue current interventions- GOC is discharge home and continue cancer treatments Will place referral for followup with outpatient Palliative at [facility]   Code Status:   Code Status: Limited: Do not attempt resuscitation (DNR) -DNR-LIMITED -Do Not Intubate/DNI    Prognosis:  Unable to determine  Discharge Planning: To Be Determined  Care plan was discussed with patient, spouse and attending Dr. Will.  Thank you for allowing the Palliative Medicine Team to assist in the care of this patient.  Total time:  55 minutes Prolonged billing:  Time includes:   Preparing to see the patient (e.g., review of tests) Obtaining and/or reviewing separately obtained history Performing a medically necessary appropriate examination and/or evaluation Counseling and educating the patient/family/caregiver Ordering medications, tests, or procedures Referring and communicating with other health care professionals (when not reported separately) Documenting clinical information in the electronic or other health record Independently interpreting results (not reported separately) and communicating results to the patient/family/caregiver Care coordination (not reported separately) Clinical documentation  Cassondra Stain, AGNP-C Palliative Medicine   Please contact Palliative Medicine Team phone at [phone] for questions and concerns.        "

## 2024-09-15 NOTE — Plan of Care (Signed)
   Problem: Education: Goal: Knowledge of General Education information will improve Description: Including pain rating scale, medication(s)/side effects and non-pharmacologic comfort measures Outcome: Progressing   Problem: Clinical Measurements: Goal: Ability to maintain clinical measurements within normal limits will improve Outcome: Progressing

## 2024-09-15 NOTE — Progress Notes (Signed)
 " PROGRESS NOTE    Ramirez Fullbright Senters  FMW:[id number] DOB: 07/11/1941 DOA: 09/06/2024 PCP: [name], NP-C  Brief Narrative:  84 y.o. 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  Patient's first chemotherapy dose was on December 16/2025, also undergoing radiation, with last day being on 10/03/2024.   Patient was recently seen at the cancer center on 12/24, noted to be neutropenic secondary to his chemo and received G-CSF injection.  On 12/26 presented back to the cancer center for his second dose of G-CSF, due to his persistent symptoms, was sent to the ED for further management.   In the ED, vital signs fairly stable, labs show pancytopenia including severe neutropenia. CTA of his chest revealed right infahilar mass, unchanged right pleural effusion and right middle lobe consolidation, slightly decreased right lower lobe airspace disease all compared to his last CT.  patient was started on IV antibiotics and admitted for further management. 09/11/2024 patient denies having any diarrhea however he has very poor appetite has not been eating or drinking at all.  He feels no taste in his mouth.  Denies nausea vomiting.   Assessment & Plan:   Principal Problem:   Neutropenic Active Problems:   Obstructive pneumonia   Sepsis (HCC)   C. difficile colitis   Malignant neoplasm of lung (HCC)  C. difficile colitis with underlying neutropenia Nausea/vomiting resolved diarrhea and vomiting however appetite remains low.   Currently afebrile, with resolving neutropenia BC x 2 NGTD, MRSA PCR negative Stool sample  positive for C. Difficile positive antigen and positive toxin with diarrhea on admission  GIP panel negative Lactic acid WNL DG abdomen no SBO, or megacolon ID on board, signed off 09/09/2024 continue p.o. Dificid  last dose September 17, 2024  Discontinued systemic IV antibiotics after 48 hours given C. difficile infection Encourage PO Neutropenic and enteric precaution   Epigastric pain Possible esophageal dysphagia/radiation-induced Reports burning in nature Esophagram showed nonspecific esophageal dysmotility disorder SLP consulted, appreciate recs Continue famotidine  IV (hold PPI in the setting of C. Difficile), Carafate  suspension Outpatient follow-up with GI   Pancytopenia secondary to chemotherapy improving.  White count 3.2 hemoglobin 9.8 platelets 79 all improving. Likely 2/2 chemotherapy, with improved neutropenia S/p filgrastim , with improvement in ANC  Hypokalemia  repleted  Acute hypoxic respiratory failure rersolved Possible acute on chronic systolic and diastolic HF/volume overload Initial CXR, no congestion, repeat chest x-ray 09/10/2024 interstitial pulmonary edema mild increase in the size of a small to moderate right pleural effusion Echo done 08/2024 showed EF of 40 to 45%, no regional wall motion abnormalities, grade 1 diastolic dysfunction He was given Lasix  first dose on 30 December Pressure on the soft side 107/69 He was taken off of oxygen on 09/13/2024 as his saturations remained above 92%.  He did not require any further diuresis.  Possible postobstructive pneumonia/radiation induced pneumonitis ??Aspiration pneumonia Pro-Cal 0.73 CTA of his chest revealed right infahilar mass, unchanged right pleural effusion and right middle lobe consolidation, slightly decreased right lower lobe airspace disease all compared to his last CT Discontinue IV Vanco, cefepime  x 48 hours SLP consulted DuoNebs, supplemental O2 as needed   Mildly elevated troponin Multiple  PVCs Troponin  33-36, flat trend, denies any left-sided pain, trace pedal edema EKG with no acute ST changes, noted PVCs, old left bundle branch block Follows with cardiology/EP, recommended on previous admission metoprolol  12.5 mg twice daily, currently on hold due to soft BP/intermittent bradycardia, may resume pending vitals due to noted PVCs    Diabetes mellitus type 2 Last A1c 7 on 06/21/2024 SSI, Accu-Cheks, hypoglycemic protocol CBG (last 3)  Recent Labs    09/15/24 0426 09/15/24 0825 09/15/24 1158  GLUCAP 128* 129* 242*      Non-small cell lung cancer/neuroendocrine tumor Diagnosed in November 2025 Currently on chemoradiation, followed by Dr. Gatha, added to the treatment team First chemotherapy dose was on December 16/2025, also undergoing radiation, with last day being on 10/07/2024 16 radiations pending as of 09/13/2024 Oncology consulted   Goals of care discussion Overall very poor prognosis given above Palliative/hospice consulted, appreciate recs Seen by physical therapy recommending home with home health when patient ready to be discharged.     Nutrition Problem: Increased nutrient needs Etiology: cancer and cancer related treatments     Signs/Symptoms: estimated needs    Interventions: Boost Breeze, Refer to RD note for recommendations  Estimated body mass index is 23.11 kg/m as calculated from the following:   Height as of this encounter: 5' 9 (1.753 m).   Weight as of this encounter: 71 kg.  DVT prophylaxis:  scd Code Status: dnr Family Communication:none Disposition Plan:  Status is: Inpatient Remains inpatient appropriate because: Acute illness   Consultants:  ID oncology and palliative  Procedures: None Antimicrobials: Dificid   Subjective:  Anxious to go home tomorrow  objective: Vitals:   09/14/24 1418 09/14/24 2004 09/15/24 0425 09/15/24 1332  BP: 91/64 111/67 (!) 106/59 (!) 114/55  Pulse: 63 98 77 73  Resp:  18 18 18    Temp: (!) 97.5 F (36.4 C) 98.8 F (37.1 C) 97.8 F (36.6 C) 98.5 F (36.9 C)  TempSrc: Oral Oral Oral Oral  SpO2: 96% 95% 91% 97%  Weight:      Height:        Intake/Output Summary (Last 24 hours) at 09/15/2024 1455 Last data filed at 09/15/2024 1304 Gross per 24 hour  Intake 2444.32 ml  Output 600 ml  Net 1844.32 ml   Filed Weights   09/06/24 1247  Weight: 71 kg    Examination:  General exam: Appears frail and chronically ill-appearing Respiratory system: Clear to auscultation. Respiratory effort normal. Cardiovascular system:reg Gastrointestinal system: soft nt Central nervous system: Alert and oriented.  Extremities: no edema  Data Reviewed: I have personally reviewed following labs and imaging studies  CBC: Recent Labs  Lab 09/09/24 0620 09/10/24 0700 09/11/24 0500 09/12/24 1221 09/13/24 0712 09/14/24 1403 09/15/24 0618  WBC 0.7* 2.3* 1.8* 2.2* 2.5* 3.9* 3.2*  NEUTROABS 0.5* 1.9 1.6*  --   --   --   --   HGB 9.7* 9.5* 10.3* 10.5* 10.5* 12.0* 9.8*  HCT 29.5* 28.5* 31.1* 32.4* 32.2* 36.1* 30.1*  MCV 87.5 86.9 87.1 87.3 86.6 85.5 86.7  PLT 19* 24* 33* 41* 45* 80* 79*   Basic Metabolic Panel: Recent Labs  Lab 09/08/24 2204 09/09/24 0620 09/10/24 0700 09/11/24 0500 09/12/24 1221 09/13/24 0712 09/14/24 1403 09/15/24 0618  NA 135   < > 138 136 133* 135 135 137  K 2.9*   < > 2.9* 2.9* 3.6 3.4* 3.9 3.7  CL 104   < > 108 102 100 102 101 105  CO2 22   < >  22 27 28 28 26 27   GLUCOSE 101*   < > 119* 137* 212* 127* 183* 128*  BUN 17   < > 15 11 13 11 16 13   CREATININE 0.84   < > 0.75 0.85 0.99 0.84 1.00 0.98  CALCIUM  7.6*   < > 7.3* 7.3* 7.5* 7.4* 7.9* 7.7*  MG 1.9  --  2.1  --   --   --   --   --    < > = values in this interval not displayed.   GFR: Estimated Creatinine Clearance: 58.1 mL/min (by C-G formula based on SCr of 0.98 mg/dL). Liver Function Tests: No results for input(s): AST, ALT, ALKPHOS, BILITOT, PROT, ALBUMIN in the last  168 hours. No results for input(s): LIPASE, AMYLASE in the last 168 hours. No results for input(s): AMMONIA in the last 168 hours. Coagulation Profile: No results for input(s): INR, PROTIME in the last 168 hours. Cardiac Enzymes: No results for input(s): CKTOTAL, CKMB, CKMBINDEX, TROPONINI in the last 168 hours. BNP (last 3 results) Recent Labs    08/15/24 1242 09/06/24 1252  PROBNP 1,200.0* 2,376.0*   HbA1C: No results for input(s): HGBA1C in the last 72 hours. CBG: Recent Labs  Lab 09/14/24 2005 09/15/24 0012 09/15/24 0426 09/15/24 0825 09/15/24 1158  GLUCAP 201* 119* 128* 129* 242*   Lipid Profile: No results for input(s): CHOL, HDL, LDLCALC, TRIG, CHOLHDL, LDLDIRECT in the last 72 hours. Thyroid  Function Tests: No results for input(s): TSH, T4TOTAL, FREET4, T3FREE, THYROIDAB in the last 72 hours. Anemia Panel: No results for input(s): VITAMINB12, FOLATE, FERRITIN, TIBC, IRON, RETICCTPCT in the last 72 hours. Sepsis Labs: No results for input(s): PROCALCITON, LATICACIDVEN in the last 168 hours.   Recent Results (from the past 240 hours)  Resp panel by RT-PCR (RSV, Flu A&B, Covid) Anterior Nasal Swab     Status: None   Collection Time: 09/06/24  1:05 PM   Specimen: Anterior Nasal Swab  Result Value Ref Range Status   SARS Coronavirus 2 by RT PCR NEGATIVE NEGATIVE Final    Comment: (NOTE) SARS-CoV-2 target nucleic acids are NOT DETECTED.  The SARS-CoV-2 RNA is generally detectable in upper respiratory specimens during the acute phase of infection. The lowest concentration of SARS-CoV-2 viral copies this assay can detect is 138 copies/mL. A negative result does not preclude SARS-Cov-2 infection and should not be used as the sole basis for treatment or other patient management decisions. A negative result may occur with  improper specimen collection/handling, submission of specimen other than nasopharyngeal  swab, presence of viral mutation(s) within the areas targeted by this assay, and inadequate number of viral copies(<138 copies/mL). A negative result must be combined with clinical observations, patient history, and epidemiological information. The expected result is Negative.  Fact Sheet for Patients:  bloggercourse.com  Fact Sheet for Healthcare Providers:  seriousbroker.it  This test is no t yet approved or cleared by the [place]  FDA and  has been authorized for detection and/or diagnosis of SARS-CoV-2 by FDA under an Emergency Use Authorization (EUA). This EUA will remain  in effect (meaning this test can be used) for the duration of the COVID-19 declaration under Section 564(b)(1) of the Act, 21 U.S.C.section 360bbb-3(b)(1), unless the authorization is terminated  or revoked sooner.       Influenza A by PCR NEGATIVE NEGATIVE Final   Influenza B by PCR NEGATIVE NEGATIVE Final    Comment: (NOTE) The Xpert Xpress SARS-CoV-2/FLU/RSV plus assay is intended as an aid in  the diagnosis of influenza from Nasopharyngeal swab specimens and should not be used as a sole basis for treatment. Nasal washings and aspirates are unacceptable for Xpert Xpress SARS-CoV-2/FLU/RSV testing.  Fact Sheet for Patients: bloggercourse.com  Fact Sheet for Healthcare Providers: seriousbroker.it  This test is not yet approved or cleared by the [place]  FDA and has been authorized for detection and/or diagnosis of SARS-CoV-2 by FDA under an Emergency Use Authorization (EUA). This EUA will remain in effect (meaning this test can be used) for the duration of the COVID-19 declaration under Section 564(b)(1) of the Act, 21 U.S.C. section 360bbb-3(b)(1), unless the authorization is terminated or revoked.     Resp Syncytial Virus by PCR NEGATIVE NEGATIVE Final    Comment: (NOTE) Fact Sheet for  Patients: bloggercourse.com  Fact Sheet for Healthcare Providers: seriousbroker.it  This test is not yet approved or cleared by the [place]  FDA and has been authorized for detection and/or diagnosis of SARS-CoV-2 by FDA under an Emergency Use Authorization (EUA). This EUA will remain in effect (meaning this test can be used) for the duration of the COVID-19 declaration under Section 564(b)(1) of the Act, 21 U.S.C. section 360bbb-3(b)(1), unless the authorization is terminated or revoked.  Performed at [facility], [address] [place], [place] [address]   C Difficile Quick Screen w PCR reflex     Status: Abnormal   Collection Time: 09/07/24  6:36 AM   Specimen: STOOL  Result Value Ref Range Status   C Diff antigen POSITIVE (A) NEGATIVE Final   C Diff toxin POSITIVE (A) NEGATIVE Final   C Diff interpretation Toxin producing C. difficile detected.  Final    Comment: CRITICAL RESULT CALLED TO, READ BACK BY AND VERIFIED WITH: ENOLA PARAS RN AT 9268 ON 09/07/2024 BY PRUDY POUR Performed at [facility], [address], [place], [place] [address]   Gastrointestinal Panel by PCR , Stool     Status: None   Collection Time: 09/07/24  7:34 AM   Specimen: Stool  Result Value Ref Range Status   Campylobacter species NOT DETECTED NOT DETECTED Final   Plesimonas shigelloides NOT DETECTED NOT DETECTED Final   Salmonella species NOT DETECTED NOT DETECTED Final   Yersinia enterocolitica NOT DETECTED NOT DETECTED Final   Vibrio species NOT DETECTED NOT DETECTED Final   Vibrio cholerae NOT DETECTED NOT DETECTED Final   Enteroaggregative E coli (EAEC) NOT DETECTED NOT DETECTED Final   Enteropathogenic E coli (EPEC) NOT DETECTED NOT DETECTED Final   Enterotoxigenic E coli (ETEC) NOT DETECTED NOT DETECTED Final   Shiga like toxin producing E coli (STEC) NOT DETECTED NOT DETECTED Final    Shigella/Enteroinvasive E coli (EIEC) NOT DETECTED NOT DETECTED Final   Cryptosporidium NOT DETECTED NOT DETECTED Final   Cyclospora cayetanensis NOT DETECTED NOT DETECTED Final   Entamoeba histolytica NOT DETECTED NOT DETECTED Final   Giardia lamblia NOT DETECTED NOT DETECTED Final   Adenovirus F40/41 NOT DETECTED NOT DETECTED Final   Astrovirus NOT DETECTED NOT DETECTED Final   Norovirus GI/GII NOT DETECTED NOT DETECTED Final   Rotavirus A NOT DETECTED NOT DETECTED Final   Sapovirus (I, II, IV, and V) NOT DETECTED NOT DETECTED Final    Comment: Performed at [facility], [address] [place], [place] [address]  Culture, blood (Routine X 2) w Reflex to ID Panel     Status: None   Collection Time: 09/07/24  9:01 AM   Specimen: BLOOD  Result Value Ref  Range Status   Specimen Description   Final    BLOOD BLOOD RIGHT ARM Performed at [facility], [address] [place], [place] [address]    Special Requests   Final    BOTTLES DRAWN AEROBIC AND ANAEROBIC Blood Culture results may not be optimal due to an inadequate volume of blood received in culture bottles Performed at [facility], [address], [place], [place] [address]    Culture   Final    NO GROWTH 5 DAYS Performed at [facility], [address] [place], [place] [address]    Report Status 09/12/2024 FINAL  Final  Culture, blood (Routine X 2) w Reflex to ID Panel     Status: None   Collection Time: 09/07/24  9:05 AM   Specimen: BLOOD  Result Value Ref Range Status   Specimen Description   Final    BLOOD BLOOD RIGHT ARM Performed at [facility], [address] [place], [place] [address]    Special Requests   Final    BOTTLES DRAWN AEROBIC ONLY Blood Culture adequate volume Performed at [facility], [address] [place], [place] [address]    Culture   Final    NO GROWTH 5 DAYS Performed at [facility], [address] [place], [place] [address]    Report Status 09/12/2024 FINAL  Final  MRSA Next Gen by PCR, Nasal     Status: None   Collection Time: 09/07/24 11:29 AM   Specimen: Nasal Mucosa; Nasal Swab  Result Value Ref Range Status   MRSA by PCR Next Gen NOT DETECTED NOT DETECTED Final    Comment: (NOTE) The GeneXpert MRSA Assay (FDA approved for NASAL specimens only), is one component of a comprehensive MRSA colonization surveillance program. It is not intended to diagnose MRSA infection nor to guide or monitor treatment for MRSA infections. Test performance is not FDA approved in patients less than 19 years old. Performed at [facility], [address] [place], [place] [address]     Radiology Studies: No results found.    Scheduled Meds:  famotidine   20 mg Oral BID   feeding supplement  1 Container Oral TID BM   fiber supplement (BANATROL TF)  60 mL Oral BID   fidaxomicin   200 mg Oral BID   insulin  aspart  0-6 Units Subcutaneous Q4H   potassium chloride   20 mEq Oral Daily   sodium chloride  flush  3 mL Intravenous Q12H   sucralfate   1 g Oral TID WC & HS   tamsulosin   0.4 mg Oral QPM   Continuous Infusions:   LOS: 9 days   Almarie KANDICE Hoots, MD 09/15/2024, 2:55 PM   "

## 2024-09-16 ENCOUNTER — Encounter: Payer: Self-pay | Admitting: Internal Medicine

## 2024-09-16 ENCOUNTER — Ambulatory Visit: Admitting: Cardiology

## 2024-09-16 ENCOUNTER — Ambulatory Visit

## 2024-09-16 ENCOUNTER — Encounter: Payer: Self-pay | Admitting: Physician Assistant

## 2024-09-16 ENCOUNTER — Other Ambulatory Visit: Payer: Self-pay

## 2024-09-16 ENCOUNTER — Other Ambulatory Visit (HOSPITAL_COMMUNITY): Payer: Self-pay

## 2024-09-16 DIAGNOSIS — A0472 Enterocolitis due to Clostridium difficile, not specified as recurrent: Secondary | ICD-10-CM | POA: Diagnosis not present

## 2024-09-16 LAB — RAD ONC ARIA SESSION SUMMARY
Course Elapsed Days: 31
Plan Fractions Treated to Date: 18
Plan Prescribed Dose Per Fraction: 2 Gy
Plan Total Fractions Prescribed: 30
Plan Total Prescribed Dose: 60 Gy
Reference Point Dosage Given to Date: 36 Gy
Reference Point Session Dosage Given: 2 Gy
Session Number: 18

## 2024-09-16 LAB — GLUCOSE, CAPILLARY
Glucose-Capillary: 119 mg/dL — ABNORMAL HIGH (ref 70–99)
Glucose-Capillary: 124 mg/dL — ABNORMAL HIGH (ref 70–99)
Glucose-Capillary: 135 mg/dL — ABNORMAL HIGH (ref 70–99)
Glucose-Capillary: 167 mg/dL — ABNORMAL HIGH (ref 70–99)

## 2024-09-16 MED ORDER — ACETAMINOPHEN 325 MG PO TABS: 650.0000 mg | ORAL_TABLET | Freq: Four times a day (QID) | ORAL | Status: AC | PRN

## 2024-09-16 MED ORDER — SUCRALFATE 1 G PO TABS
1.0000 g | ORAL_TABLET | Freq: Three times a day (TID) | ORAL | 0 refills | Status: DC
  Filled 2024-09-16: qty 100, 25d supply, fill #0

## 2024-09-16 MED ORDER — ALUM & MAG HYDROXIDE-SIMETH 200-200-20 MG/5ML PO SUSP
15.0000 mL | ORAL | 0 refills | Status: AC | PRN
  Filled 2024-09-16 (×2): qty 355, 4d supply, fill #0

## 2024-09-16 MED ORDER — METOPROLOL TARTRATE 25 MG PO TABS
12.5000 mg | ORAL_TABLET | Freq: Every day | ORAL | 0 refills | Status: AC
  Filled 2024-09-16 (×2): qty 45, 90d supply, fill #0

## 2024-09-16 NOTE — Plan of Care (Signed)

## 2024-09-16 NOTE — Care Management Important Message (Signed)
 Important Message  Patient Details IM Letter given. Name: Edward [name] MRN: [id number] Date of Birth: July 28, 1941   Important Message Given:  Yes - Medicare IM     Promiss Labarbera 09/16/2024, 9:31 AM

## 2024-09-16 NOTE — Progress Notes (Signed)
 Discharge medications delivered to patient at the bedside in a secure bag.

## 2024-09-16 NOTE — TOC Transition Note (Signed)
 Transition of Care [facility]) - Discharge Note   Patient Details  Name: Edward [name] MRN: [id number] Date of Birth: December 08, 1940  Transition of Care [facility]) CM/SW Contact:  Toy LITTIE Agar, RN Phone Number:[phone]  09/16/2024, 3:33 PM   Clinical Narrative:    Patient discharging home with home health services. No other needs noted at this time.    Final next level of care: Home w Home Health Services Barriers to Discharge: No Barriers Identified   Patient Goals and CMS Choice Patient states their goals for this hospitalization and ongoing recovery are:: Wants to go home CMS Medicare.gov Compare Post Acute Care list provided to:: Patient Represenative (must comment)        Discharge Placement                       Discharge Plan and Services Additional resources added to the After Visit Summary for     Discharge Planning Services: CM Consult Post Acute Care Choice: Home Health          DME Arranged: N/A         HH Arranged: PT HH Agency: [facility] Date [facility] Agency Contacted: 09/16/24 Time HH Agency Contacted: [id number] Representative spoke with at [facility] Agency: Darleene  Social Drivers of Health (SDOH) Interventions SDOH Screenings   Food Insecurity: No Food Insecurity (09/06/2024)  Housing: Low Risk (09/06/2024)  Transportation Needs: No Transportation Needs (09/06/2024)  Utilities: Not At Risk (09/06/2024)  Alcohol  Screen: Low Risk (01/09/2024)  Depression (PHQ2-9): Low Risk (08/28/2024)  Financial Resource Strain: Low Risk (01/09/2024)  Physical Activity: Sufficiently Active (01/09/2024)  Social Connections: Socially Integrated (09/06/2024)  Stress: No Stress Concern Present (01/09/2024)  Tobacco Use: Medium Risk (09/10/2024)  Health Literacy: Adequate Health Literacy (01/09/2024)     Readmission Risk Interventions    09/13/2024    8:45 AM  Readmission Risk Prevention Plan  Transportation Screening Complete  PCP or Specialist Appt within 3-5 Days  Complete  HRI or Home Care Consult Complete  Social Work Consult for Recovery Care Planning/Counseling Complete  Palliative Care Screening Complete  Medication Review Oceanographer) Complete

## 2024-09-17 ENCOUNTER — Ambulatory Visit

## 2024-09-17 ENCOUNTER — Other Ambulatory Visit (HOSPITAL_COMMUNITY): Payer: Self-pay

## 2024-09-17 ENCOUNTER — Telehealth: Payer: Self-pay

## 2024-09-17 DIAGNOSIS — C7A8 Other malignant neuroendocrine tumors: Secondary | ICD-10-CM

## 2024-09-17 MED FILL — Fosaprepitant Dimeglumine For IV Infusion 150 MG (Base Eq): INTRAVENOUS | Qty: 5 | Status: AC

## 2024-09-17 NOTE — Telephone Encounter (Signed)
 Attempted to contact patient regarding scheduled appointments for this week. Left voicemail informing patient that, due to recent hospitalization, treatments for this week will be canceled to allow adequate recovery time. Advised that the scheduling team will contact him with new appointments for next week. Requested a return call with any questions or concerns.

## 2024-09-18 ENCOUNTER — Inpatient Hospital Stay: Admitting: Dietician

## 2024-09-18 ENCOUNTER — Ambulatory Visit: Payer: Self-pay

## 2024-09-18 ENCOUNTER — Inpatient Hospital Stay

## 2024-09-18 ENCOUNTER — Ambulatory Visit (HOSPITAL_COMMUNITY)
Admission: RE | Admit: 2024-09-18 | Discharge: 2024-09-18 | Disposition: A | Discharge status: Home / Self Care | Source: Ambulatory Visit | Attending: Radiation Oncology | Admitting: Radiation Oncology

## 2024-09-18 ENCOUNTER — Other Ambulatory Visit: Payer: Self-pay

## 2024-09-18 ENCOUNTER — Ambulatory Visit
Admission: RE | Admit: 2024-09-18 | Discharge: 2024-09-18 | Disposition: A | Discharge status: Home / Self Care | Source: Ambulatory Visit | Attending: Radiation Oncology | Admitting: Radiation Oncology

## 2024-09-18 ENCOUNTER — Inpatient Hospital Stay: Admitting: Physician Assistant

## 2024-09-18 ENCOUNTER — Encounter: Payer: Self-pay | Admitting: Physician Assistant

## 2024-09-18 ENCOUNTER — Encounter: Payer: Self-pay | Admitting: Internal Medicine

## 2024-09-18 DIAGNOSIS — R600 Localized edema: Secondary | ICD-10-CM | POA: Diagnosis present

## 2024-09-18 LAB — RAD ONC ARIA SESSION SUMMARY
Course Elapsed Days: 33
Plan Fractions Treated to Date: 19
Plan Prescribed Dose Per Fraction: 2 Gy
Plan Total Fractions Prescribed: 30
Plan Total Prescribed Dose: 60 Gy
Reference Point Dosage Given to Date: 38 Gy
Reference Point Session Dosage Given: 2 Gy
Session Number: 19

## 2024-09-18 NOTE — Progress Notes (Signed)
 Patient and wife presented to the lobby requesting to speak with the nurse. Provided patient with newly printed calendars for the months of January and February. Patient scheduled for Port-A-Cath placement at [facility] on 09/26/24 with an 11:30 AM arrival time.  Pre-procedure instructions reviewed with wife, including NPO after midnight, clear liquids allowed until 7:00 AM, may take morning medications with a sip of water, and the need for a driver and 75-ynlm supervision post-procedure. Wife verbalized understanding.  Patient has a radiation appointment scheduled on 09/26/24; wife was informed that the radiation appointment will need to be rescheduled to before the port placement.   Wife provided with the billing department phone number upon request.  Reminded wife that since patient was recently hospitalized; wife was advised that patient will need a follow-up appointment with primary care provider. No further questions at this time.

## 2024-09-18 NOTE — Telephone Encounter (Addendum)
 FYI Only or Action Required?: FYI only for provider: ED advised.  Patient was last seen in primary care on 06/27/2024 by Alvia Corean CROME, FNP.  Called Nurse Triage reporting Leg Swelling.  Symptoms began several weeks ago.  Interventions attempted: Nothing.  Symptoms are: gradually worsening.  Triage Disposition: See HCP Within 4 Hours (Or PCP Triage)  Patient/caregiver understands and will follow disposition?: No, wishes to speak with PCP   Accidentally closed chart before finishing notes. Patient's wife wants to know why leg swelling was not addressed by hospital before. I told her I don't see anything about the swelling in the note, but this is important and patient needs to be evaluated for worsening swelling. Patient and wife told it is not safe to wait till 09/25/2024 visit to be evaluated for this and he needs to go today. Wife concerned they'll keep him and requesting call ahead. I said I can't do that but even if they keep him that's fine, he needs to be seen.   Copied from CRM [id number]. Topic: Clinical - Red Word Triage >> Sep 18, 2024 10:15 AM China J wrote: [facility] that prompted transfer to Nurse Triage: Patient's legs and feet are very swollen. He was released from the hospital last night and I was able to schedule the follow up before knowing about the swelling. Reason for Disposition  [1] Red area or streak [2] large (> 2 inches or 5 cm)  Answer Assessment - Initial Assessment Questions Patient's wife says doctor and nurse in the hospital  stopped amoxicillin , vitaminc c, aspirin , flonase , lidocaine  cream for radiation burn, metformin , protonix , stiolto Respiap. Start carafate   and geri lanta but wasn't given medication for. Patient's wife is concerned about why this was done. Needs refill of Lopressor . Says they were told change how you take it but then didn't give instructions and patient is out. Patient told he had C diff but said as long as he didn't have loose  bowels he wouldn't be infectious. Has not had any loose BM.  1. ONSET: When did the swelling start? (e.g., minutes, hours, days)     Around 09/04/2024. Patient  2. LOCATION: What part of the leg is swollen?  Are both legs swollen or just one leg?     Both legs are swollen up to the knees, feet are worse. Says they brought this up to doctors at hospital but it was not addressed. Says it has gotten a little bit worse since they left the hospital. Wife says she's never known him to have swelling here. 4. REDNESS: Is there redness or signs of infection?     Denies 5. PAIN: Is the swelling painful to touch? If Yes, ask: How painful is it?   (Scale 1-10; mild, moderate or severe)     Denies 6. FEVER: Do you have a fever? If Yes, ask: What is it, how was it measured, and when did it start?      Denies 7. CAUSE: What do you think is causing the leg swelling?     Unsure 8. MEDICAL HISTORY: Do you have a history of blood clots (e.g., DVT), cancer, heart failure, kidney disease, or liver failure?     *No Answer* 9. RECURRENT SYMPTOM: Have you had leg swelling before? If Yes, ask: When was the last time? What happened that time?     *No Answer* 10. OTHER SYMPTOMS: Do you have any other symptoms? (e.g., chest pain, difficulty breathing)       *No Answer*  Protocols used: Leg Swelling and Edema-A-AH

## 2024-09-18 NOTE — Telephone Encounter (Signed)
 LM for pt he needs to call office and schedule appt, can be seen by another provider here. If not he needs to be seen at [facility]

## 2024-09-18 NOTE — Telephone Encounter (Signed)
-  Reports both legs are swollen up to the knee, concerned as it wasn't addressed in hospital  -meds were changed in hospital but pt wife reports they didn't tell her how he should be taking (looks like instructions may already be associated w the medication?) needs refill of metoprolol

## 2024-09-19 ENCOUNTER — Other Ambulatory Visit: Payer: Self-pay

## 2024-09-19 ENCOUNTER — Inpatient Hospital Stay

## 2024-09-19 ENCOUNTER — Telehealth: Payer: Self-pay

## 2024-09-19 ENCOUNTER — Ambulatory Visit: Payer: Self-pay

## 2024-09-19 ENCOUNTER — Ambulatory Visit
Admission: RE | Admit: 2024-09-19 | Discharge: 2024-09-19 | Disposition: A | Discharge status: Home / Self Care | Source: Ambulatory Visit | Attending: Radiation Oncology | Admitting: Radiation Oncology

## 2024-09-19 DIAGNOSIS — C7A8 Other malignant neuroendocrine tumors: Secondary | ICD-10-CM

## 2024-09-19 LAB — RAD ONC ARIA SESSION SUMMARY
Course Elapsed Days: 34
Plan Fractions Treated to Date: 20
Plan Prescribed Dose Per Fraction: 2 Gy
Plan Total Fractions Prescribed: 30
Plan Total Prescribed Dose: 60 Gy
Reference Point Dosage Given to Date: 40 Gy
Reference Point Session Dosage Given: 2 Gy
Session Number: 20

## 2024-09-19 NOTE — Telephone Encounter (Signed)
 Ok for [employer]

## 2024-09-19 NOTE — Telephone Encounter (Signed)
 Called and spoke w christy, advised that this order needs to go to oncology. If orders need to come from pcp he has appt on 1/14. She took oncology's number just in case

## 2024-09-19 NOTE — Telephone Encounter (Signed)
 Copied from CRM [id number]. Topic: Clinical - Home Health Verbal Orders >> Sep 19, 2024 10:26 AM Alfonso ORN wrote: Caller/Agency: Damien from [facility] Callback Number: [phone] Service Requested: Physical Therapy Frequency: starting on 1/10, freq determined after initial visit Any new concerns about the patient? No

## 2024-09-19 NOTE — Telephone Encounter (Signed)
 Contacted wife carol who states she was just informing us  of all the new information and changes. I told her that was alright and l just wanted to check in to see if she had any questions or needed anything from us  as pt has upcoming hospital f/u 1/14 and we can discuss medication changes and everything discussed then. Pt wife verbalized understanding and states she will let us  know if she needs us  before then.

## 2024-09-19 NOTE — Telephone Encounter (Signed)
 FYI Only or Action Required?: Action required by provider: update on patient condition.  Patient was last seen in primary care on 06/27/2024 by Alvia Corean CROME, FNP.  Called Nurse Triage reporting Leg Swelling.  Symptoms began several days ago.  Interventions attempted: Rest, hydration, or home remedies.  Symptoms are: gradually improving.  Triage Disposition: Information or Advice Only Call  Patient/caregiver understands and will follow disposition?: Yes  Copied from CRM [id number]. Topic: Clinical - Red Word Triage >> Sep 19, 2024  3:09 PM Berneda FALCON wrote: Red Word that prompted transfer to Nurse Triage: Patient's wife [name] called and asked if she could speak to [facility]. Called CAL to see if she was available to speak with and Rocky let me know to send a message. She states [name] called about swelling in the legs but they were in the oncology center and PCP there looked at it for them and he sent them for an ultrasound and there were no blood clots. Was instructed to get compression socks which seem to be helping but he still has swelling in his legs. She is also concerned with how weak he is currently  Wife is growing concerned as the hospital took him off of 5 of his medications: -Augmentin  -Vitamin C -Flonase  nasal spray -Metforin (her biggest concern) -Pantoprazole  -Asprin 81MG  Reason for Disposition  [1] Other NON-URGENT information for PCP AND [2] does not require PCP response  Answer Assessment - Initial Assessment Questions 1. REASON FOR CALL or QUESTION: What is your reason for calling today? or How can I best     Follow up-- Patient was evaluated by Oncology and ordered a doppler to rule out DVT- they have also obtained compression stockings to help with swelling. Wife upset hospital brushed her off when asked about why he wasn't wearing them while admitted.  Two hospital admissions, chemo infusions, patient with generalized weakness/activity intolerance. Slowly  improving little by little.  HFU appt 1/14 in AM prior to infusion. Placed on the waitlist to be seen sooner if possible. Will see if can get in sooner if works out with infusion schedule.  2. CALLER: Document the source of call. (e.g., laboratory staff, caregiver or patient).     Wife carol.  Protocols used: PCP Call - No Triage-A-AH

## 2024-09-20 ENCOUNTER — Other Ambulatory Visit: Payer: Self-pay

## 2024-09-20 ENCOUNTER — Ambulatory Visit
Admission: RE | Admit: 2024-09-20 | Discharge: 2024-09-20 | Disposition: A | Source: Ambulatory Visit | Attending: Radiation Oncology | Admitting: Radiation Oncology

## 2024-09-20 ENCOUNTER — Ambulatory Visit: Payer: Self-pay | Admitting: Family Medicine

## 2024-09-20 ENCOUNTER — Inpatient Hospital Stay

## 2024-09-20 LAB — CBC WITH DIFFERENTIAL/PLATELET
Abs Immature Granulocytes: 0 K/uL (ref 0.00–0.07)
Basophils Absolute: 0 K/uL (ref 0.0–0.1)
Basophils Relative: 0 %
Eosinophils Absolute: 0 K/uL (ref 0.0–0.5)
Eosinophils Relative: 0 %
HCT: 31.5 % — ABNORMAL LOW (ref 39.0–52.0)
Hemoglobin: 10.3 g/dL — ABNORMAL LOW (ref 13.0–17.0)
Immature Granulocytes: 0 %
Lymphocytes Relative: 49 %
Lymphs Abs: 0.1 K/uL — ABNORMAL LOW (ref 0.7–4.0)
MCH: 28.5 pg (ref 26.0–34.0)
MCHC: 32.7 g/dL (ref 30.0–36.0)
MCV: 87 fL (ref 80.0–100.0)
Monocytes Absolute: 0 K/uL — ABNORMAL LOW (ref 0.1–1.0)
Monocytes Relative: 13 %
Neutro Abs: 0.1 K/uL — CL (ref 1.7–7.7)
Neutrophils Relative %: 38 %
Platelets: 22 K/uL — CL (ref 150–400)
RBC: 3.62 MIL/uL — ABNORMAL LOW (ref 4.22–5.81)
RDW: 13.5 % (ref 11.5–15.5)
WBC: 0.2 K/uL — CL (ref 4.0–10.5)
nRBC: 0 % (ref 0.0–0.2)

## 2024-09-20 LAB — RAD ONC ARIA SESSION SUMMARY
Course Elapsed Days: 35
Plan Fractions Treated to Date: 21
Plan Prescribed Dose Per Fraction: 2 Gy
Plan Total Fractions Prescribed: 30
Plan Total Prescribed Dose: 60 Gy
Reference Point Dosage Given to Date: 42 Gy
Reference Point Session Dosage Given: 2 Gy
Session Number: 21

## 2024-09-20 MED FILL — Fosaprepitant Dimeglumine For IV Infusion 150 MG (Base Eq): INTRAVENOUS | Qty: 5 | Status: AC

## 2024-09-20 NOTE — Discharge Summary (Signed)
 Physician Discharge Summary  Edward [name] FMW:[id number] DOB: 1941/06/21 DOA: 09/06/2024  PCP: [name], NP-C  Admit date: 09/06/2024 Discharge date: 09/16/2024 Admitted From: home Disposition home Recommendations for Outpatient Follow-up:  Follow up with PCP in 1-2 weeks Please obtain BMP/CBC in one week  Home Health:none Equipment/Devices:none  Discharge Condition:stable CODE STATUS:dnr Diet recommendation: cardiac Brief/Interim Summary: 84 y.o. 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  Patient's first chemotherapy dose was on December 16/2025, also undergoing radiation, with last day being on 10/03/2024.   Patient was recently seen at the cancer center on 12/24, noted to be neutropenic secondary to his chemo and received G-CSF injection.  On 12/26 presented back to the cancer center for his second dose of G-CSF, due to his persistent symptoms, was sent to the ED for further management.   In the ED, vital signs fairly stable, labs show pancytopenia including severe neutropenia. CTA of his chest revealed right infahilar mass, unchanged right pleural effusion and right middle lobe consolidation, slightly decreased right lower lobe airspace disease all compared to his last CT.  patient was started on IV antibiotics and admitted for further management. 09/11/2024 patient denies having any diarrhea however he has very poor appetite has not been eating or drinking at all.  He feels no taste in his mouth.  Denies nausea vomiting.    Discharge Diagnoses:  Principal Problem:   Neutropenic Active  Problems:   Obstructive pneumonia   Sepsis (HCC)   C. difficile colitis   Malignant neoplasm of lung (HCC)    C. difficile colitis with underlying neutropenia BC x 2 NGTD, MRSA PCR negative Stool sample positive for C. Difficile positive antigen and positive toxin with diarrhea on admission  GIP panel negative ID on board recommended to continue p.o. Dificid  last dose September 17, 2024  Discontinued systemic IV antibiotics after 48 hours given C. difficile infection   Epigastric pain Possible esophageal dysphagia/radiation-induced Esophagram showed nonspecific esophageal dysmotility disorder Continue carafate  suspension Outpatient follow-up with GI   Pancytopenia secondary to chemotherapy improving.  White count 3.2 hemoglobin 9.8 platelets 79 all improving. Likely 2/2 chemotherapy, with improved neutropenia S/p filgrastim , with improvement in ANC   Hypokalemia  repleted   Acute hypoxic respiratory failure rersolved Possible acute on chronic systolic and diastolic HF/volume overload Initial CXR, no congestion, repeat chest x-ray 09/10/2024 interstitial pulmonary edema mild increase in the size of a small to moderate right pleural effusion Echo done 08/2024 showed EF of 40 to 45%, no regional wall motion abnormalities, grade 1 diastolic dysfunction He was given Lasix   one dose He was taken off of oxygen  as his saturations remained above 92%.  He did not require any further diuresis.  Possible postobstructive pneumonia/radiation induced pneumonitis ??Aspiration pneumonia Pro-Cal 0.73 CTA of his chest revealed right infahilar mass, unchanged right pleural effusion and right middle lobe consolidation, slightly decreased right lower lobe airspace disease all compared to his last CT Discontinue IV Vanco, cefepime  x 48 hours   Mildly elevated troponin Multiple PVCs Troponin 33-36, flat trend, denies any left-sided pain, trace pedal edema EKG with no acute ST changes, noted PVCs, old  left bundle branch block Follows with  cardiology/EP, recommended on previous admission metoprolol  12.5 mg twice daily, discharged on daily dose due to soft bp.    Diabetes mellitus type 2 Last A1c 7 on 06/21/2024  Non-small cell lung cancer/neuroendocrine tumor Diagnosed in November 2025 Currently on chemoradiation, followed by Dr. Gatha First chemotherapy dose was on December 16/2025, also undergoing radiation, with last day being on 10/07/2024   Goals of care discussion Overall very poor prognosis given above Palliative/hospice consulted, appreciate recs Seen by physical therapy recommending home with home health    Nutrition Problem: Increased nutrient needs Etiology: cancer and cancer related treatments  Signs/Symptoms: estimated needs  Interventions: Boost Breeze, Refer to RD note for recommendations  Estimated body mass index is 23.11 kg/m as calculated from the following:   Height as of this encounter: 5' 9 (1.753 m).   Weight as of this encounter: 71 kg.  Discharge Instructions  Discharge Instructions     Amb Referral to Palliative Care   Complete by: As directed    Increase activity slowly   Complete by: As directed       Allergies as of 09/16/2024       Reactions   Bystolic [nebivolol Hcl] Other (See Comments)   Reaction not recalled        Medication List     STOP taking these medications    amoxicillin -clavulanate 875-125 MG tablet Commonly known as: AUGMENTIN    ascorbic acid 500 MG tablet Commonly known as: VITAMIN C   aspirin  EC 81 MG tablet   fluticasone  50 MCG/ACT nasal spray Commonly known as: FLONASE    lidocaine -prilocaine  cream Commonly known as: EMLA    metFORMIN  500 MG tablet Commonly known as: GLUCOPHAGE    pantoprazole  40 MG tablet Commonly known as: PROTONIX    Stiolto Respimat  2.5-2.5 MCG/ACT Aers Generic drug: Tiotropium Bromide-Olodaterol       TAKE these medications    acetaminophen  325 MG tablet Commonly  known as: TYLENOL  Take 2 tablets (650 mg total) by mouth every 6 (six) hours as needed for mild pain (pain score 1-3) or fever (or Fever >/= 101).   albuterol  1.25 MG/3ML nebulizer solution Commonly known as: ACCUNEB  Inhale 3 mL (1.25 mg total) by nebulization every 6 (six) hours as needed for wheezing. What changed:  when to take this additional instructions   albuterol  108 (90 Base) MCG/ACT inhaler Commonly known as: VENTOLIN  HFA Inhale 2 puffs into the lungs every 4 (four) hours as needed for wheezing or shortness of breath. What changed: Another medication with the same name was changed. Make sure you understand how and when to take each.   atorvastatin  40 MG tablet Commonly known as: LIPITOR Take 1 tablet (40 mg total) by mouth every evening for cholesterol   CINNAMON PO Take 1-2 capsules by mouth daily.   Claritin 10 MG tablet Generic drug: loratadine Take 10 mg by mouth daily.   famotidine  40 MG tablet Commonly known as: PEPCID  Take 1 tablet (40 mg total) by mouth every evening to prevent heartburn and indigestion. What changed:  when to take this additional instructions   Geri-Lanta 200-200-20 MG/5ML suspension Generic drug: alum & mag hydroxide-simeth Take 15 mLs by mouth every 4 (four) hours as needed for indigestion or heartburn.   losartan  50 MG tablet Commonly known as: COZAAR  Take 1 tablet (50 mg total) by mouth daily for blood pressure.   metoprolol  tartrate 25 MG tablet Commonly known as: LOPRESSOR  Take 0.5 tablets (12.5 mg total) by mouth daily at 2 am. What changed: when to  take this   OMEGA-3 FATTY ACIDS PO Take 1 capsule by mouth 3 (three) times a week.   ondansetron  8 MG tablet Commonly known as: ZOFRAN  Take 1 tablet (8 mg total) by mouth every 8 (eight) hours as needed for nausea or vomiting. Start on third day after chemotherapy.   OneTouch Verio test strip Generic drug: glucose blood USE TO TEST BLOOD SUGAR ONCE DAILY   glucose blood  test strip Use 1 strip to check glucose once daily.   prochlorperazine  10 MG tablet Commonly known as: COMPAZINE  Take 1 tablet (10 mg total) by mouth every 6 (six) hours as needed for nausea or vomiting (Nausea or vomiting).   sucralfate  1 g tablet Commonly known as: CARAFATE  Take 1 tablet (1 g total) by mouth 4 (four) times daily -  with meals and at bedtime.   Systane Complete PF 0.6 % Soln Generic drug: Propylene Glycol (PF) Place 1 drop into both eyes 3 (three) times daily as needed (for dryness or irritation).   tamsulosin  0.4 MG Caps capsule Commonly known as: FLOMAX  Take 1 capsule (0.4 mg total) by mouth every evening for prostate.   VITAMIN B 12 PO Take 1 tablet by mouth daily.   Vitamin D3 125 MCG (5000 UT) Caps Take 5,000 Units by mouth daily.   zinc gluconate 50 MG tablet Take 50 mg by mouth daily.        Contact information for follow-up providers     Henson, Vickie L, NP-C. Call in 1 week(s).   Specialty: Family Medicine Contact information: [address] [place] [address] [phone]              Contact information for after-discharge care     Durable Medical Equipment     [facility] (DME) .   Service: Durable Medical Equipment Contact information: [address] [place]  [address] [phone]             Home Medical Care     [facility]) .   Service: Home Health Services Contact information: [address] [place]  [address] [phone]                    Allergies[1]  Consultations:palliative IR    Procedures/Studies: VAS US  LOWER EXTREMITY VENOUS (DVT) Result Date: 09/18/2024  Lower Venous DVT Study Patient Name:  FRANCE NOYCE  Date of Exam:   09/18/2024 Medical Rec #: [id number]          Accession #:    [id number] Date of Birth: 01-14-1941          Patient Gender: M Patient Age:   28 years Exam Location:   [address] Procedure:      VAS US  LOWER EXTREMITY VENOUS (DVT) Referring Phys: LYNWOOD NASUTI --------------------------------------------------------------------------------  Indications: Edema.  Risk Factors: Cancer lung. Performing Technologist: Stoney Ross RVT  Examination Guidelines: A complete evaluation includes B-mode imaging, spectral Doppler, color Doppler, and power Doppler as needed of all accessible portions of each vessel. Bilateral testing is considered an integral part of a complete examination. Limited examinations for reoccurring indications may be performed as noted. The reflux portion of the exam is performed with the patient in reverse Trendelenburg.  +---------+---------------+---------+-----------+----------+--------------+ RIGHT    CompressibilityPhasicitySpontaneityPropertiesThrombus Aging +---------+---------------+---------+-----------+----------+--------------+ CFV      Full           Yes      Yes                                 +---------+---------------+---------+-----------+----------+--------------+  SFJ      Full                    Yes                                 +---------+---------------+---------+-----------+----------+--------------+ FV Prox  Full                    Yes                                 +---------+---------------+---------+-----------+----------+--------------+ FV Mid   Full           Yes      Yes                                 +---------+---------------+---------+-----------+----------+--------------+ FV DistalFull                    Yes                                 +---------+---------------+---------+-----------+----------+--------------+ PFV      Full                    Yes                                 +---------+---------------+---------+-----------+----------+--------------+ POP      Full           Yes      Yes                                  +---------+---------------+---------+-----------+----------+--------------+ PTV      Full                    Yes                                 +---------+---------------+---------+-----------+----------+--------------+ PERO     Full                    Yes                                 +---------+---------------+---------+-----------+----------+--------------+ Gastroc  Full                                                        +---------+---------------+---------+-----------+----------+--------------+ GSV      Full                    Yes                                 +---------+---------------+---------+-----------+----------+--------------+  +---------+---------------+---------+-----------+----------+--------------+ LEFT     CompressibilityPhasicitySpontaneityPropertiesThrombus Aging +---------+---------------+---------+-----------+----------+--------------+ CFV      Full  Yes      Yes                                 +---------+---------------+---------+-----------+----------+--------------+ SFJ      Full                    Yes                                 +---------+---------------+---------+-----------+----------+--------------+ FV Prox  Full                    Yes                                 +---------+---------------+---------+-----------+----------+--------------+ FV Mid   Full           Yes      Yes                                 +---------+---------------+---------+-----------+----------+--------------+ FV DistalFull                    Yes                                 +---------+---------------+---------+-----------+----------+--------------+ PFV      Full                    Yes                                 +---------+---------------+---------+-----------+----------+--------------+ POP      Full           Yes      Yes                                  +---------+---------------+---------+-----------+----------+--------------+ PTV      Full                    Yes                                 +---------+---------------+---------+-----------+----------+--------------+ PERO     Full                    Yes                                 +---------+---------------+---------+-----------+----------+--------------+ Gastroc  Full                                                        +---------+---------------+---------+-----------+----------+--------------+ GSV      Full                    Yes                                 +---------+---------------+---------+-----------+----------+--------------+  Findings reported to Preliminary report routed to Dr. Shannon at 445.  Summary: RIGHT: - There is no evidence of deep vein thrombosis in the lower extremity. - There is no evidence of superficial venous thrombosis.  - No cystic structure found in the popliteal fossa.  LEFT: - There is no evidence of deep vein thrombosis in the lower extremity. - There is no evidence of superficial venous thrombosis.  - No cystic structure found in the popliteal fossa.  *Interstitial fluid noted in the bilateral calves. *See table(s) above for measurements and observations. Electronically signed by Gaile New MD on 09/18/2024 at 7:09:57 PM.    Final    DG CHEST PORT 1 VIEW Result Date: 09/10/2024 CLINICAL DATA:  Shortness of breath. EXAM: PORTABLE CHEST 1 VIEW COMPARISON:  Chest CTA dated 09/06/2024 and portable chest dated 09/06/2024. FINDINGS: Stable enlarged cardiac silhouette. Improved inspiration with increased prominence of the interstitial markings, including Kerley lines. Mild increase in size of a small to moderate-sized right pleural effusion. No significant change in patchy and linear opacity at the right lung base. Thoracic spine degenerative changes. IMPRESSION: 1. Stable cardiomegaly with interval interstitial pulmonary edema. 2. Mild increase  in size of a small to moderate-sized right pleural effusion. 3. No significant change in right basilar atelectasis and possible pneumonia. Electronically Signed   By: Elspeth Bathe M.D.   On: 09/10/2024 16:31   DG ESOPHAGUS W SINGLE CM (SOL OR THIN BA) Result Date: 09/09/2024 EXAM: SINGLE CONTRAST ESOPHAGRAM 09/09/2024 12:35:45 PM TECHNIQUE: Multiple single contrast images of the esophagus and gastroesophageal junction were obtained following the oral administration of water soluble contrast. The patient had difficulty standing on today's exam and was not able to assume various standard positions and turning employed in routine esophagram. The protocol was modified including using LPO images and skipping the pharyngeal phase assessment in order to accommodate his ability. FLUOROSCOPY DOSE AND TYPE: Radiation Dose Index: Reference Air Kerma (in mGy) = COMPARISON: None available. CLINICAL HISTORY: Dysphagia. FINDINGS: On the single contrast images, there is disruption of primary peristaltic waves in the proximal to mid esophagus on 3 out of 3 swallows compatible with esophageal dysmotility. No definite stricture identified. No luminal wall irregularity is appreciated with the understanding that today's examination was single contrast and has some limitations related to patient mobility. The 13 mm barium tablet had transient delay in the small hiatal hernia but with a second swallow of water, passed into the intraabdominal portion of the stomach. A small type 1 hiatal hernia is observed. No evidence of leak. No evidence of achalasia. No gastroesophageal reflux was elicited during examination. IMPRESSION: 1. Nonspecific esophageal dysmotility disorder, with disruption of primary peristaltic waves in the proximal to mid esophagus. 2. Small type 1 hiatal hernia. Electronically signed by: Ryan Salvage MD 09/09/2024 01:05 PM EST RP Workstation: [id number]   DG Abd Portable 1V Result Date: 09/07/2024 CLINICAL  DATA:  Intractable nausea and vomiting. EXAM: PORTABLE ABDOMEN - 1 VIEW COMPARISON:  PET CT 07/22/2024 FINDINGS: No gaseous small bowel distension or evidence of obstruction. Slight increased air within transverse colon. Only minimal formed stool in the colon. Right renal stone on prior PET CT is not well demonstrated on the current exam. Excreted IV contrast in the urinary bladder. Degenerative change of both hips. IMPRESSION: No bowel obstruction. Air throughout the colon can be seen with diarrheal process. Electronically Signed   By: Andrea Gasman M.D.   On: 09/07/2024 17:04   CT Angio Chest PE W and/or Wo  Contrast Result Date: 09/06/2024 EXAM: CTA CHEST 09/06/2024 02:14:40 PM TECHNIQUE: CTA of the chest was performed without and with the administration of 75 mL of iohexol  (OMNIPAQUE ) 350 MG/ML injection. Multiplanar reformatted images are provided for review. MIP images are provided for review. Automated exposure control, iterative reconstruction, and/or weight based adjustment of the mA/kV was utilized to reduce the radiation dose to as low as reasonably achievable. COMPARISON: 08/15/2024 CLINICAL HISTORY: Pulmonary embolism (PE) suspected, low to intermediate prob, positive D-dimer. FINDINGS: PULMONARY ARTERIES: Pulmonary arteries are adequately opacified for evaluation. No acute pulmonary embolus. Main pulmonary artery is normal in caliber. Similar narrowing of the right middle and right lower lobe pulmonary arteries due to the infrahilar mass. MEDIASTINUM: Similar cardiomegaly. LAD atherosclerosis. There is no acute abnormality of the thoracic aorta. LYMPH NODES: Unchanged left paratracheal lymph node measuring approximately 1.3 cm in short axis dimension. No hilar or axillary lymphadenopathy. LUNGS AND PLEURA: Overall, similar appearance of the lobular right infrahilar mass, measuring 7.2 x 6.2 cm. The airspace consolidation in the right lower lobe has slightly decreased in size in the interim. The  pleural effusion is similar in size to the prior exam. Right middle lobe bronchus is occluded with similar consolidation in the right middle lobe. Posterior dependent atelectasis in the left lower lobe. No pneumothorax. UPPER ABDOMEN: No acute abnormality within the partially visualized upper abdomen. SOFT TISSUES AND BONES: Multilevel degenerative disc disease of the spine. No acute soft tissue abnormality. IMPRESSION: 1. No evidence of pulmonary embolism. 2. Lobular right infrahilar mass measuring 7.2 x 6.2 cm, similar to prior study, causing narrowing of the right middle and right lower lobe pulmonary arteries. 3. Slightly decreased airspace consolidation in the right lower lobe compared to prior study with otherwise similar size of the right pleural effusion. Similar appearance of the right middle lobe airspace consolidation. Electronically signed by: Rogelia Myers MD 09/06/2024 02:46 PM EST RP Workstation: [id number]   DG Chest Portable 1 View Result Date: 09/06/2024 CLINICAL DATA:  Chest pain EXAM: PORTABLE CHEST 1 VIEW COMPARISON:  August 14, 2024 FINDINGS: Stable cardiomegaly. Stable right basilar opacity concerning for pneumonia or atelectasis with possible small right pleural effusion. Bony thorax is unremarkable. IMPRESSION: Stable right basilar opacity as described above. Electronically Signed   By: Lynwood Landy Raddle M.D.   On: 09/06/2024 13:58   (Echo, Carotid, EGD, Colonoscopy, ERCP)    Subjective: NO NEW C/O  Discharge Exam: Vitals:   09/16/24 1423 09/16/24 1434  BP: (!) 139/128 127/61  Pulse: 74 (!) 47  Resp: (!) 22 20  Temp: (!) 100.5 F (38.1 C) 98.6 F (37 C)  SpO2: 95% 95%   Vitals:   09/15/24 2008 09/16/24 0408 09/16/24 1423 09/16/24 1434  BP: (!) 124/56 113/69 (!) 139/128 127/61  Pulse: 90 74 74 (!) 47  Resp: 20 14 (!) 22 20  Temp: 98.9 F (37.2 C) 98.9 F (37.2 C) (!) 100.5 F (38.1 C) 98.6 F (37 C)  TempSrc: Oral Oral Oral Oral  SpO2: 94% 94% 95% 95%   Weight:      Height:        General: Pt is alert, awake, not in acute distress Cardiovascular: RRR, S1/S2 +, no rubs, no gallops Respiratory: CTA bilaterally, no wheezing, no rhonchi Abdominal: Soft, NT, ND, bowel sounds + Extremities: no edema, no cyanosis    The results of significant diagnostics from this hospitalization (including imaging, microbiology, ancillary and laboratory) are listed below for reference.     Microbiology: No results  found for this or any previous visit (from the past 240 hours).   Labs: BNP (last 3 results) No results for input(s): BNP in the last 8760 hours. Basic Metabolic Panel: Recent Labs  Lab 09/14/24 1403 09/15/24 0618  NA 135 137  K 3.9 3.7  CL 101 105  CO2 26 27  GLUCOSE 183* 128*  BUN 16 13  CREATININE 1.00 0.98  CALCIUM  7.9* 7.7*   Liver Function Tests: No results for input(s): AST, ALT, ALKPHOS, BILITOT, PROT, ALBUMIN in the last 168 hours. No results for input(s): LIPASE, AMYLASE in the last 168 hours. No results for input(s): AMMONIA in the last 168 hours. CBC: Recent Labs  Lab 09/14/24 1403 09/15/24 0618  WBC 3.9* 3.2*  HGB 12.0* 9.8*  HCT 36.1* 30.1*  MCV 85.5 86.7  PLT 80* 79*   Cardiac Enzymes: No results for input(s): CKTOTAL, CKMB, CKMBINDEX, TROPONINI in the last 168 hours. BNP: Invalid input(s): POCBNP CBG: Recent Labs  Lab 09/15/24 2004 09/16/24 0002 09/16/24 0405 09/16/24 0758 09/16/24 1146  GLUCAP 129* 119* 124* 135* 167*   D-Dimer No results for input(s): DDIMER in the last 72 hours. Hgb A1c No results for input(s): HGBA1C in the last 72 hours. Lipid Profile No results for input(s): CHOL, HDL, LDLCALC, TRIG, CHOLHDL, LDLDIRECT in the last 72 hours. Thyroid  function studies No results for input(s): TSH, T4TOTAL, T3FREE, THYROIDAB in the last 72 hours.  Invalid input(s): FREET3 Anemia work up No results for input(s): VITAMINB12,  FOLATE, FERRITIN, TIBC, IRON, RETICCTPCT in the last 72 hours. Urinalysis    Component Value Date/Time   COLORURINE AMBER (A) 09/07/2024 1638   APPEARANCEUR HAZY (A) 09/07/2024 1638   LABSPEC 1.036 (H) 09/07/2024 1638   PHURINE 5.0 09/07/2024 1638   GLUCOSEU 50 (A) 09/07/2024 1638   HGBUR MODERATE (A) 09/07/2024 1638   BILIRUBINUR NEGATIVE 09/07/2024 1638   KETONESUR 5 (A) 09/07/2024 1638   PROTEINUR 100 (A) 09/07/2024 1638   NITRITE NEGATIVE 09/07/2024 1638   LEUKOCYTESUR NEGATIVE 09/07/2024 1638   Sepsis Labs Recent Labs  Lab 09/14/24 1403 09/15/24 0618  WBC 3.9* 3.2*   Microbiology No results found for this or any previous visit (from the past 240 hours).   Time coordinating discharge: 40 min SIGNED:   Almarie KANDICE Hoots, MD  [facility] 09/20/2024, 12:09 PM     [1]  Allergies Allergen Reactions   Bystolic [Nebivolol Hcl] Other (See Comments)    Reaction not recalled

## 2024-09-20 NOTE — Progress Notes (Signed)
 Stable WBC, platelets are slightly decreased. Please ask him if he is seeing any bleeding. If he is having bleeding that does not stop, such as nosebleeds, rectal bleeding, etc, he will need to go to the emergency department. He should continue close follow up with his oncologist/hematologist.

## 2024-09-22 NOTE — Progress Notes (Unsigned)
 [facility] OFFICE PROGRESS NOTE  [name], NP-C [address] [place] [address]  DIAGNOSIS: Stage IIIC (T3, N3, M0) well-differentiated neuroendocrine tumor, suspicious for atypical carcinoid presented with right suprahilar mass with occlusion of the right middle lobe and constriction of the right lower lobe bronchus in addition to hypermetabolic lymph node extending to the contralateral mediastinum and possibly left internal jugular station. Diagnosed in November 2025   PRIOR THERAPY: None  CURRENT THERAPY: Systemic chemotherapy with carboplatin  for AUC of 5 on day 1 and etoposide  100 mg/M2 on days 1, 2 and 3 every 3 weeks concurrent with radiotherapy. First dose August 27, 2024. Status post 1 cycles. Starting from cycle #2 his dose reduced to carboplatin  for an AUC 4 and etoposide  80 mg/m.  INTERVAL HISTORY: Jshawn R Revelo 84 y.o. male returns to the clinic today for a follow-up visit by his wife.  The patient was last seen in clinic on 08/31/2024.  He is status post 1 cycle of chemotherapy.  He was then hospitalized starting on 12/26-09/16/2024 midsternal chest pain, abdominal pain, vomiting, and diarrhea and chills.  He also had neutropenia which required G-CSF injections.    Labs showed neutropenia.  He was found to have C. difficile colitis.  He completed antibiotics for this with Dificid .  Patient states that his diarrhea has resolved at this time.  His last loose bowel movement was when he was in the hospital.  He reports that his stools are formed.  He has some esophagitis from radiation.  His last day radiation is scheduled for 10/08/2024.  It looks like he was sent a prescription for Carafate  but they state that they did not receive this and were not aware of this.  They need this resent to the pharmacy.  He continues to experience significant fatigue, with persistent tiredness and early bedtime. He has anemia with hemoglobin in the 9 g/dL range. No  fevers, chills, or night sweats have occurred since discharge.  He has persistent dysgeusia and ageusia, stating that nothing tastes right and he cannot taste anything. He is unable to eat much, primarily consuming peanut butter crackers, and is unable to tolerate commercial protein drinks. Dietary intake remains poor, and he has gained approximately three pounds, likely due to fluid retention.  He has localized lower extremity edema up to the knees, minimally improved with leg elevation. He does not take diuretics and has compression stockings available at home. No rashes are present.  No abdominal pain, nausea, or vomiting. Diarrhea during hospitalization has resolved, and stools are currently firm.  No shortness of breath at rest or with exertion. No cough except for occasional episodes, and no hemoptysis. Oxygen saturation is 96%.  He is scheduled for Port-A-Cath later this week.  He is here today for evaluation repeat blood work before undergoing cycle #2.  MEDICAL HISTORY: Past Medical History:  Diagnosis Date   BPH (benign prostatic hyperplasia)    Cancer (HCC)    Cataract    surgical removal bilateral   CKD (chronic kidney disease) stage 3, GFR 30-59 ml/min (HCC)    DM (diabetes mellitus), type 2 (HCC)    ED (erectile dysfunction)    GERD (gastroesophageal reflux disease)    History of adenomatous polyp of colon 11/02/2017   Tubular adenoma 2013; recommended 5 year follow up   History of kidney stones    Hyperlipidemia    Hypertension    IBS (irritable bowel syndrome)    Kidney stones 02/11/2020  LBBB (left bundle branch block)    Lung nodule    Personal history of kidney stones    Vitamin D  deficiency     ALLERGIES:  is allergic to bystolic [nebivolol hcl].  MEDICATIONS:  Current Outpatient Medications  Medication Sig Dispense Refill   acetaminophen  (TYLENOL ) 325 MG tablet Take 2 tablets (650 mg total) by mouth every 6 (six) hours as needed for mild pain (pain  score 1-3) or fever (or Fever >/= 101).     albuterol  (ACCUNEB ) 1.25 MG/3ML nebulizer solution Inhale 3 mL (1.25 mg total) by nebulization every 6 (six) hours as needed for wheezing. (Patient taking differently: Take 1 ampule by nebulization See admin instructions. Nebulize 1.25 mg (1 ampule) and inhale into he lungs two times a day and an additional 2 times a day as needed for shortness of breath or wheezing) 75 mL 12   albuterol  (VENTOLIN  HFA) 108 (90 Base) MCG/ACT inhaler Inhale 2 puffs into the lungs every 4 (four) hours as needed for wheezing or shortness of breath. 6.7 g 2   alum & mag hydroxide-simeth (MAALOX/MYLANTA) 200-200-20 MG/5ML suspension Take 15 mLs by mouth every 4 (four) hours as needed for indigestion or heartburn. 355 mL 0   atorvastatin  (LIPITOR) 40 MG tablet Take 1 tablet (40 mg total) by mouth every evening for cholesterol 270 tablet 0   Cholecalciferol (VITAMIN D3) 125 MCG (5000 UT) CAPS Take 5,000 Units by mouth daily.     CINNAMON PO Take 1-2 capsules by mouth daily.     CLARITIN 10 MG tablet Take 10 mg by mouth daily.     Cyanocobalamin (VITAMIN B 12 PO) Take 1 tablet by mouth daily.     famotidine  (PEPCID ) 40 MG tablet Take 1 tablet (40 mg total) by mouth every evening to prevent heartburn and indigestion. (Patient taking differently: Take 40 mg by mouth See admin instructions. Take 40 mg by mouth in the evening as needed for reflux or indigestion) 90 tablet 0   glucose blood (ONETOUCH VERIO) test strip USE TO TEST BLOOD SUGAR ONCE DAILY 100 strip 12   glucose blood test strip Use 1 strip to check glucose once daily. 100 each 12   losartan  (COZAAR ) 50 MG tablet Take 1 tablet (50 mg total) by mouth daily for blood pressure. 90 tablet 0   metoprolol  tartrate (LOPRESSOR ) 25 MG tablet Take 0.5 tablets (12.5 mg total) by mouth daily at 2 am. 90 tablet 0   OMEGA-3 FATTY ACIDS PO Take 1 capsule by mouth 3 (three) times a week.     ondansetron  (ZOFRAN ) 8 MG tablet Take 1 tablet (8  mg total) by mouth every 8 (eight) hours as needed for nausea or vomiting. Start on third day after chemotherapy. 30 tablet 1   prochlorperazine  (COMPAZINE ) 10 MG tablet Take 1 tablet (10 mg total) by mouth every 6 (six) hours as needed for nausea or vomiting (Nausea or vomiting). 30 tablet 1   sucralfate  (CARAFATE ) 1 g tablet Take 1 tablet (1 g total) by mouth 4 (four) times daily -  with meals and at bedtime. 120 tablet 0   SYSTANE COMPLETE PF 0.6 % SOLN Place 1 drop into both eyes 3 (three) times daily as needed (for dryness or irritation).     tamsulosin  (FLOMAX ) 0.4 MG CAPS capsule Take 1 capsule (0.4 mg total) by mouth every evening for prostate. 90 capsule 0   zinc gluconate 50 MG tablet Take 50 mg by mouth daily.     No  current facility-administered medications for this visit.    SURGICAL HISTORY:  Past Surgical History:  Procedure Laterality Date   CATARACT EXTRACTION Right 2015   CATARACT EXTRACTION W/ INTRAOCULAR LENS IMPLANT  2013   left   COLONOSCOPY  2013   TA   ENDOBRONCHIAL ULTRASOUND Bilateral 07/30/2024   Procedure: ENDOBRONCHIAL ULTRASOUND (EBUS);  Surgeon: Isadora Hose, MD;  Location: [facility] OR[facility];  Service: Pulmonary;  Laterality: Bilateral;   EXTRACORPOREAL SHOCK WAVE LITHOTRIPSY Left 12/11/2017   Procedure: LEFT EXTRACORPOREAL SHOCK WAVE LITHOTRIPSY (ESWL);  Surgeon: Carolee Sherwood JONETTA DOUGLAS, MD;  Location: [facility];  Service: Urology;  Laterality: Left;   KIDNEY STONE SURGERY     POLYPECTOMY     VIDEO BRONCHOSCOPY WITH ENDOBRONCHIAL ULTRASOUND Bilateral 07/18/2024   Procedure: BRONCHOSCOPY, WITH EBUS;  Surgeon: Catherine Cools, MD;  Location: [facility];  Service: Pulmonary;  Laterality: Bilateral;    REVIEW OF SYSTEMS:   Review of Systems  Constitutional: Positive for fatigue and appetite change. Negative for  chills and fever.  HENT: Positive for taste alterations. Negative for mouth sores, nosebleeds, sore throat and trouble swallowing.   Eyes: Negative for eye  problems and icterus.  Respiratory: Stable shortness of breath with exertion. Negative for cough, hemoptysis, and wheezing.   Cardiovascular: Negative for chest pain. Positive for lower extremity edema.  Gastrointestinal: Negative for abdominal pain, constipation, diarrhea, nausea and vomiting.  Genitourinary: Negative for bladder incontinence, difficulty urinating, dysuria, frequency and hematuria.   Musculoskeletal: Negative for back pain, gait problem, neck pain and neck stiffness.  Skin: Negative for itching and rash.  Neurological: Negative for dizziness, extremity weakness, gait problem, headaches, light-headedness and seizures.  Hematological: Negative for adenopathy. Does not bleed easily. Positive for easy bruising.  Psychiatric/Behavioral: Negative for confusion, depression and sleep disturbance. The patient is not nervous/anxious.     PHYSICAL EXAMINATION:  Blood pressure 112/83, pulse 63, temperature 98.6 F (37 C), temperature source Temporal, resp. rate 17, height 5' 9 (1.753 m), weight 160 lb (72.6 kg), SpO2 96%.  ECOG PERFORMANCE STATUS: 1  Physical Exam  Constitutional: Oriented to person, place, and time and well-developed, well-nourished, and in no distress.  HENT:  Head: Normocephalic and atraumatic.  Mouth/Throat: Oropharynx is clear and moist. No oropharyngeal exudate.  Eyes: Conjunctivae are normal. Right eye exhibits no discharge. Left eye exhibits no discharge. No scleral icterus.  Neck: Normal range of motion. Neck supple.  Cardiovascular: Normal rate, regular rhythm, normal heart sounds and intact distal pulses.   Pulmonary/Chest: Effort normal and breath sounds normal. No respiratory distress. No wheezes. No rales.  Abdominal: Soft. Bowel sounds are normal. Exhibits no distension and no mass. There is no tenderness.  Musculoskeletal: Normal range of motion. Positive for lower extremity edema.  Lymphadenopathy:    No cervical adenopathy.  Neurological:  Positive for head titubation. Alert and oriented to person, place, and time. Exhibits normal muscle tone. Gait normal. Coordination normal.  Skin: Skin is warm and dry. Positive for easy bruising. No rash noted. Not diaphoretic. No erythema. No pallor.  Psychiatric: Mood, memory and judgment normal.  Vitals reviewed.  LABORATORY DATA: Lab Results  Component Value Date   WBC 2.7 (L) 09/23/2024   HGB 9.8 (L) 09/23/2024   HCT 29.5 (L) 09/23/2024   MCV 85.3 09/23/2024   PLT 227 09/23/2024      Chemistry      Component Value Date/Time   NA 139 09/23/2024 0750   K 3.0 (L) 09/23/2024 0750   CL 104 09/23/2024 0750  CO2 27 09/23/2024 0750   BUN 9 09/23/2024 0750   CREATININE 1.08 09/23/2024 0750   CREATININE 1.34 (H) 08/22/2023 1156      Component Value Date/Time   CALCIUM  7.8 (L) 09/23/2024 0750   ALKPHOS 60 09/23/2024 0750   AST 13 (L) 09/23/2024 0750   ALT 9 09/23/2024 0750   BILITOT 0.5 09/23/2024 0750       RADIOGRAPHIC STUDIES:  VAS US  LOWER EXTREMITY VENOUS (DVT) Result Date: 09/18/2024  Lower Venous DVT Study Patient Name:  Donny Heffern Locy  Date of Exam:   09/18/2024 Medical Rec #: [id number]          Accession #:    [id number] Date of Birth: [birth year]          Patient Gender: M Patient Age:   23 years Exam Location:  [address] Procedure:      VAS US  LOWER EXTREMITY VENOUS (DVT) Referring Phys: LYNWOOD NASUTI --------------------------------------------------------------------------------  Indications: Edema.  Risk Factors: Cancer lung. Performing Technologist: Stoney Ross RVT  Examination Guidelines: A complete evaluation includes B-mode imaging, spectral Doppler, color Doppler, and power Doppler as needed of all accessible portions of each vessel. Bilateral testing is considered an integral part of a complete examination. Limited examinations for reoccurring indications may be performed as noted. The reflux portion of the exam is performed with the patient in reverse  Trendelenburg.  +---------+---------------+---------+-----------+----------+--------------+ RIGHT    CompressibilityPhasicitySpontaneityPropertiesThrombus Aging +---------+---------------+---------+-----------+----------+--------------+ CFV      Full           Yes      Yes                                 +---------+---------------+---------+-----------+----------+--------------+ SFJ      Full                    Yes                                 +---------+---------------+---------+-----------+----------+--------------+ FV Prox  Full                    Yes                                 +---------+---------------+---------+-----------+----------+--------------+ FV Mid   Full           Yes      Yes                                 +---------+---------------+---------+-----------+----------+--------------+ FV DistalFull                    Yes                                 +---------+---------------+---------+-----------+----------+--------------+ PFV      Full                    Yes                                 +---------+---------------+---------+-----------+----------+--------------+ POP      Full  Yes      Yes                                 +---------+---------------+---------+-----------+----------+--------------+ PTV      Full                    Yes                                 +---------+---------------+---------+-----------+----------+--------------+ PERO     Full                    Yes                                 +---------+---------------+---------+-----------+----------+--------------+ Gastroc  Full                                                        +---------+---------------+---------+-----------+----------+--------------+ GSV      Full                    Yes                                 +---------+---------------+---------+-----------+----------+--------------+   +---------+---------------+---------+-----------+----------+--------------+ LEFT     CompressibilityPhasicitySpontaneityPropertiesThrombus Aging +---------+---------------+---------+-----------+----------+--------------+ CFV      Full           Yes      Yes                                 +---------+---------------+---------+-----------+----------+--------------+ SFJ      Full                    Yes                                 +---------+---------------+---------+-----------+----------+--------------+ FV Prox  Full                    Yes                                 +---------+---------------+---------+-----------+----------+--------------+ FV Mid   Full           Yes      Yes                                 +---------+---------------+---------+-----------+----------+--------------+ FV DistalFull                    Yes                                 +---------+---------------+---------+-----------+----------+--------------+ PFV      Full                    Yes                                 +---------+---------------+---------+-----------+----------+--------------+  POP      Full           Yes      Yes                                 +---------+---------------+---------+-----------+----------+--------------+ PTV      Full                    Yes                                 +---------+---------------+---------+-----------+----------+--------------+ PERO     Full                    Yes                                 +---------+---------------+---------+-----------+----------+--------------+ Gastroc  Full                                                        +---------+---------------+---------+-----------+----------+--------------+ GSV      Full                    Yes                                 +---------+---------------+---------+-----------+----------+--------------+  Findings reported to Preliminary report routed  to Dr. Shannon at 445.  Summary: RIGHT: - There is no evidence of deep vein thrombosis in the lower extremity. - There is no evidence of superficial venous thrombosis.  - No cystic structure found in the popliteal fossa.  LEFT: - There is no evidence of deep vein thrombosis in the lower extremity. - There is no evidence of superficial venous thrombosis.  - No cystic structure found in the popliteal fossa.  *Interstitial fluid noted in the bilateral calves. *See table(s) above for measurements and observations. Electronically signed by Gaile New MD on 09/18/2024 at 7:09:57 PM.    Final    DG CHEST PORT 1 VIEW Result Date: 09/10/2024 CLINICAL DATA:  Shortness of breath. EXAM: PORTABLE CHEST 1 VIEW COMPARISON:  Chest CTA dated 09/06/2024 and portable chest dated 09/06/2024. FINDINGS: Stable enlarged cardiac silhouette. Improved inspiration with increased prominence of the interstitial markings, including Kerley lines. Mild increase in size of a small to moderate-sized right pleural effusion. No significant change in patchy and linear opacity at the right lung base. Thoracic spine degenerative changes. IMPRESSION: 1. Stable cardiomegaly with interval interstitial pulmonary edema. 2. Mild increase in size of a small to moderate-sized right pleural effusion. 3. No significant change in right basilar atelectasis and possible pneumonia. Electronically Signed   By: Elspeth Bathe M.D.   On: 09/10/2024 16:31   DG ESOPHAGUS W SINGLE CM (SOL OR THIN BA) Result Date: 09/09/2024 EXAM: SINGLE CONTRAST ESOPHAGRAM 09/09/2024 12:35:45 PM TECHNIQUE: Multiple single contrast images of the esophagus and gastroesophageal junction were obtained following the oral administration of water soluble contrast. The patient had difficulty standing on today's exam and was not able to assume various standard positions and turning employed in routine esophagram. The protocol  was modified including using LPO images and skipping the pharyngeal  phase assessment in order to accommodate his ability. FLUOROSCOPY DOSE AND TYPE: Radiation Dose Index: Reference Air Kerma (in mGy) = COMPARISON: None available. CLINICAL HISTORY: Dysphagia. FINDINGS: On the single contrast images, there is disruption of primary peristaltic waves in the proximal to mid esophagus on 3 out of 3 swallows compatible with esophageal dysmotility. No definite stricture identified. No luminal wall irregularity is appreciated with the understanding that today's examination was single contrast and has some limitations related to patient mobility. The 13 mm barium tablet had transient delay in the small hiatal hernia but with a second swallow of water, passed into the intraabdominal portion of the stomach. A small type 1 hiatal hernia is observed. No evidence of leak. No evidence of achalasia. No gastroesophageal reflux was elicited during examination. IMPRESSION: 1. Nonspecific esophageal dysmotility disorder, with disruption of primary peristaltic waves in the proximal to mid esophagus. 2. Small type 1 hiatal hernia. Electronically signed by: Ryan Salvage MD 09/09/2024 01:05 PM EST RP Workstation: [id number]   DG Abd Portable 1V Result Date: 09/07/2024 CLINICAL DATA:  Intractable nausea and vomiting. EXAM: PORTABLE ABDOMEN - 1 VIEW COMPARISON:  PET CT 07/22/2024 FINDINGS: No gaseous small bowel distension or evidence of obstruction. Slight increased air within transverse colon. Only minimal formed stool in the colon. Right renal stone on prior PET CT is not well demonstrated on the current exam. Excreted IV contrast in the urinary bladder. Degenerative change of both hips. IMPRESSION: No bowel obstruction. Air throughout the colon can be seen with diarrheal process. Electronically Signed   By: Andrea Gasman M.D.   On: 09/07/2024 17:04   CT Angio Chest PE W and/or Wo Contrast Result Date: 09/06/2024 EXAM: CTA CHEST 09/06/2024 02:14:40 PM TECHNIQUE: CTA of the chest was  performed without and with the administration of 75 mL of iohexol  (OMNIPAQUE ) 350 MG/ML injection. Multiplanar reformatted images are provided for review. MIP images are provided for review. Automated exposure control, iterative reconstruction, and/or weight based adjustment of the mA/kV was utilized to reduce the radiation dose to as low as reasonably achievable. COMPARISON: 08/15/2024 CLINICAL HISTORY: Pulmonary embolism (PE) suspected, low to intermediate prob, positive D-dimer. FINDINGS: PULMONARY ARTERIES: Pulmonary arteries are adequately opacified for evaluation. No acute pulmonary embolus. Main pulmonary artery is normal in caliber. Similar narrowing of the right middle and right lower lobe pulmonary arteries due to the infrahilar mass. MEDIASTINUM: Similar cardiomegaly. LAD atherosclerosis. There is no acute abnormality of the thoracic aorta. LYMPH NODES: Unchanged left paratracheal lymph node measuring approximately 1.3 cm in short axis dimension. No hilar or axillary lymphadenopathy. LUNGS AND PLEURA: Overall, similar appearance of the lobular right infrahilar mass, measuring 7.2 x 6.2 cm. The airspace consolidation in the right lower lobe has slightly decreased in size in the interim. The pleural effusion is similar in size to the prior exam. Right middle lobe bronchus is occluded with similar consolidation in the right middle lobe. Posterior dependent atelectasis in the left lower lobe. No pneumothorax. UPPER ABDOMEN: No acute abnormality within the partially visualized upper abdomen. SOFT TISSUES AND BONES: Multilevel degenerative disc disease of the spine. No acute soft tissue abnormality. IMPRESSION: 1. No evidence of pulmonary embolism. 2. Lobular right infrahilar mass measuring 7.2 x 6.2 cm, similar to prior study, causing narrowing of the right middle and right lower lobe pulmonary arteries. 3. Slightly decreased airspace consolidation in the right lower lobe compared to prior study with otherwise  similar size of  the right pleural effusion. Similar appearance of the right middle lobe airspace consolidation. Electronically signed by: Rogelia Myers MD 09/06/2024 02:46 PM EST RP Workstation: [id number]   DG Chest Portable 1 View Result Date: 09/06/2024 CLINICAL DATA:  Chest pain EXAM: PORTABLE CHEST 1 VIEW COMPARISON:  August 14, 2024 FINDINGS: Stable cardiomegaly. Stable right basilar opacity concerning for pneumonia or atelectasis with possible small right pleural effusion. Bony thorax is unremarkable. IMPRESSION: Stable right basilar opacity as described above. Electronically Signed   By: Lynwood Landy Raddle M.D.   On: 09/06/2024 13:58     ASSESSMENT/PLAN:  This is a very pleasant 84 year old Caucasian male with stage IIIc (T3, N3, M0) small cell lung cancer, well-differentiated neuroendocrine tumor suspicious for atypical carcinoid.  He presented with a right suprahilar mass with occlusion of the right middle lobe and constriction of the right lower lobe bronchus in addition to hypermetabolic lymph node extending into the contralateral mediastinum and possible left internal jugular station.  The patient was diagnosed in November 2025.   He is currently undergoing radiation to the obstructed bronchus.  Dr. Sherrod had previously recommended chemotherapy with carboplatin  for an AUC of 5 on day 1, etoposide  100 mg/m on days 1, 2, and 3 IV every 3 weeks for 4 cycles.  He is status post 1 cycle.  The patient was recently hospitalized for C. Difficile  He also had significant neutropenia with cycle #1.  The patient was seen with Dr. Sherrod.  Dr. Sherrod reviewed his labs.  Dr. Sherrod will adjust his doses of chemotherapy to plan for an AUC of 4 and etoposide  80 mg/m starting from cycle #2.  He will proceed with cycle #2 today scheduled.  We may be able to add long-acting G-CSF injections starting from cycle #3 after he completes radiation which is scheduled for 10/08/2024 he  completed.  We will monitor his labs closely on a weekly basis and arrange for short acting G-CSF injections as needed for neutropenia.  I will add a standing order for sample of blood bank.  Refilled his Carafate .  His potassium is low at 3.0.  I will send potassium supplements to the pharmacy.  Lower extremity edema likely due to hypoalbuminemia - Advised daytime use of compression stockings. - Recommended leg elevation when seated. - Advised restriction of dietary sodium. - Discussed increasing dietary protein intake, including supplements.  - Ordered chemistry panel to assess serum protein levels.  Chemotherapy-induced taste disturbance Persistent dysgeusia and ageusia with aversion to certain foods, likely transient from chemotherapy. - Recommended salt water rinses and meticulous oral hygiene. - Suggested Lemonhead candies to stimulate taste. - Provided dietary counseling   The patient was advised to call immediately if she has any concerning symptoms in the interval. The patient voices understanding of current disease status and treatment options and is in agreement with the current care plan. All questions were answered. The patient knows to call the clinic with any problems, questions or concerns. We can certainly see the patient much sooner if necessary      Orders Placed This Encounter  Procedures   Sample to Blood Bank    Standing Status:   Standing    Number of Occurrences:   5    Next Expected Occurrence:   09/27/2024    Expiration Date:   09/23/2025    Hagan Maltz L Dorothie Wah, PA-C 09/23/2024  ADDENDUM: Hematology/Oncology Attending: I had a face-to-face encounter with the patient today.  I reviewed his record, lab and recommended his care  plan.  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  The patient undergoing a course of systemic chemotherapy with carboplatin  and Doutova side concurrent with radiation.  He is status post 1 cycle.  His treatment was complicated with pancytopenia and admission to the hospital.  He was also treated at that time for C. difficile with a course of antibiotics.  His condition has improved and he is feeling much better except for esophagitis from radiation. I recommended for the patient to proceed with cycle #2 of the chemotherapy but I will reduce the dose of carboplatin  to AUC 4 on day 1 and etoposide  80 mg/M2 on days 1, 2 and 3 every 3 weeks. The patient was advised to call immediately if he has any other concerning symptoms in the interval. Disclaimer: This note was dictated with voice recognition software. Similar sounding words can inadvertently be transcribed and may be missed upon review. Sherrod MARLA Sherrod, MD

## 2024-09-23 ENCOUNTER — Other Ambulatory Visit (HOSPITAL_COMMUNITY): Payer: Self-pay

## 2024-09-23 ENCOUNTER — Other Ambulatory Visit: Payer: Self-pay

## 2024-09-23 ENCOUNTER — Inpatient Hospital Stay

## 2024-09-23 ENCOUNTER — Ambulatory Visit
Admission: RE | Admit: 2024-09-23 | Discharge: 2024-09-23 | Disposition: A | Source: Ambulatory Visit | Attending: Radiation Oncology | Admitting: Radiation Oncology

## 2024-09-23 ENCOUNTER — Telehealth: Payer: Self-pay | Admitting: *Deleted

## 2024-09-23 ENCOUNTER — Inpatient Hospital Stay: Attending: Internal Medicine | Admitting: Physician Assistant

## 2024-09-23 ENCOUNTER — Inpatient Hospital Stay: Attending: Internal Medicine

## 2024-09-23 ENCOUNTER — Other Ambulatory Visit: Payer: Self-pay | Admitting: Physician Assistant

## 2024-09-23 VITALS — BP 112/83 | HR 63 | Temp 98.6°F | Resp 17 | Ht 69.0 in | Wt 160.0 lb

## 2024-09-23 DIAGNOSIS — E8809 Other disorders of plasma-protein metabolism, not elsewhere classified: Secondary | ICD-10-CM | POA: Insufficient documentation

## 2024-09-23 DIAGNOSIS — R110 Nausea: Secondary | ICD-10-CM | POA: Insufficient documentation

## 2024-09-23 DIAGNOSIS — C7A8 Other malignant neuroendocrine tumors: Secondary | ICD-10-CM

## 2024-09-23 DIAGNOSIS — C7A1 Malignant poorly differentiated neuroendocrine tumors: Secondary | ICD-10-CM | POA: Insufficient documentation

## 2024-09-23 DIAGNOSIS — N183 Chronic kidney disease, stage 3 unspecified: Secondary | ICD-10-CM | POA: Insufficient documentation

## 2024-09-23 DIAGNOSIS — Z87891 Personal history of nicotine dependence: Secondary | ICD-10-CM | POA: Insufficient documentation

## 2024-09-23 DIAGNOSIS — C3431 Malignant neoplasm of lower lobe, right bronchus or lung: Secondary | ICD-10-CM | POA: Insufficient documentation

## 2024-09-23 DIAGNOSIS — E1122 Type 2 diabetes mellitus with diabetic chronic kidney disease: Secondary | ICD-10-CM | POA: Insufficient documentation

## 2024-09-23 DIAGNOSIS — Z5111 Encounter for antineoplastic chemotherapy: Secondary | ICD-10-CM | POA: Insufficient documentation

## 2024-09-23 DIAGNOSIS — R1310 Dysphagia, unspecified: Secondary | ICD-10-CM | POA: Diagnosis not present

## 2024-09-23 DIAGNOSIS — T451X5A Adverse effect of antineoplastic and immunosuppressive drugs, initial encounter: Secondary | ICD-10-CM | POA: Insufficient documentation

## 2024-09-23 DIAGNOSIS — I1310 Hypertensive heart and chronic kidney disease without heart failure, with stage 1 through stage 4 chronic kidney disease, or unspecified chronic kidney disease: Secondary | ICD-10-CM | POA: Insufficient documentation

## 2024-09-23 DIAGNOSIS — E876 Hypokalemia: Secondary | ICD-10-CM | POA: Diagnosis not present

## 2024-09-23 DIAGNOSIS — J9809 Other diseases of bronchus, not elsewhere classified: Secondary | ICD-10-CM | POA: Insufficient documentation

## 2024-09-23 DIAGNOSIS — N400 Enlarged prostate without lower urinary tract symptoms: Secondary | ICD-10-CM | POA: Insufficient documentation

## 2024-09-23 DIAGNOSIS — Z79899 Other long term (current) drug therapy: Secondary | ICD-10-CM | POA: Insufficient documentation

## 2024-09-23 DIAGNOSIS — J90 Pleural effusion, not elsewhere classified: Secondary | ICD-10-CM | POA: Insufficient documentation

## 2024-09-23 DIAGNOSIS — E785 Hyperlipidemia, unspecified: Secondary | ICD-10-CM | POA: Insufficient documentation

## 2024-09-23 DIAGNOSIS — K589 Irritable bowel syndrome without diarrhea: Secondary | ICD-10-CM | POA: Insufficient documentation

## 2024-09-23 DIAGNOSIS — D61818 Other pancytopenia: Secondary | ICD-10-CM | POA: Insufficient documentation

## 2024-09-23 LAB — CMP (CANCER CENTER ONLY)
ALT: 9 U/L (ref 0–44)
AST: 13 U/L — ABNORMAL LOW (ref 15–41)
Albumin: 2.7 g/dL — ABNORMAL LOW (ref 3.5–5.0)
Alkaline Phosphatase: 60 U/L (ref 38–126)
Anion gap: 9 (ref 5–15)
BUN: 9 mg/dL (ref 8–23)
CO2: 27 mmol/L (ref 22–32)
Calcium: 7.8 mg/dL — ABNORMAL LOW (ref 8.9–10.3)
Chloride: 104 mmol/L (ref 98–111)
Creatinine: 1.08 mg/dL (ref 0.61–1.24)
GFR, Estimated: >60 mL/min
Glucose, Bld: 164 mg/dL — ABNORMAL HIGH (ref 70–99)
Potassium: 3 mmol/L — ABNORMAL LOW (ref 3.5–5.1)
Sodium: 139 mmol/L (ref 135–145)
Total Bilirubin: 0.5 mg/dL (ref 0.0–1.2)
Total Protein: 5.1 g/dL — ABNORMAL LOW (ref 6.5–8.1)

## 2024-09-23 LAB — CBC WITH DIFFERENTIAL (CANCER CENTER ONLY)
Abs Immature Granulocytes: 0 K/uL (ref 0.00–0.07)
Basophils Absolute: 0 K/uL (ref 0.0–0.1)
Basophils Relative: 1 %
Eosinophils Absolute: 0 K/uL (ref 0.0–0.5)
Eosinophils Relative: 0 %
HCT: 29.5 % — ABNORMAL LOW (ref 39.0–52.0)
Hemoglobin: 9.8 g/dL — ABNORMAL LOW (ref 13.0–17.0)
Immature Granulocytes: 0 %
Lymphocytes Relative: 23 %
Lymphs Abs: 0.6 K/uL — ABNORMAL LOW (ref 0.7–4.0)
MCH: 28.3 pg (ref 26.0–34.0)
MCHC: 33.2 g/dL (ref 30.0–36.0)
MCV: 85.3 fL (ref 80.0–100.0)
Monocytes Absolute: 0.3 K/uL (ref 0.1–1.0)
Monocytes Relative: 13 %
Neutro Abs: 1.7 K/uL (ref 1.7–7.7)
Neutrophils Relative %: 63 %
Platelet Count: 227 K/uL (ref 150–400)
RBC: 3.46 MIL/uL — ABNORMAL LOW (ref 4.22–5.81)
RDW: 15 % (ref 11.5–15.5)
WBC Count: 2.7 K/uL — ABNORMAL LOW (ref 4.0–10.5)
nRBC: 0 % (ref 0.0–0.2)

## 2024-09-23 LAB — RAD ONC ARIA SESSION SUMMARY
Course Elapsed Days: 38
Plan Fractions Treated to Date: 22
Plan Prescribed Dose Per Fraction: 2 Gy
Plan Total Fractions Prescribed: 30
Plan Total Prescribed Dose: 60 Gy
Reference Point Dosage Given to Date: 44 Gy
Reference Point Session Dosage Given: 2 Gy
Session Number: 22

## 2024-09-23 LAB — SAMPLE TO BLOOD BANK

## 2024-09-23 MED ORDER — SUCRALFATE 1 G PO TABS
1.0000 g | ORAL_TABLET | Freq: Three times a day (TID) | ORAL | 0 refills | Status: AC
  Filled 2024-09-23 – 2024-10-05 (×4): qty 120, 30d supply, fill #0

## 2024-09-23 MED ORDER — POTASSIUM CHLORIDE CRYS ER 20 MEQ PO TBCR
20.0000 meq | EXTENDED_RELEASE_TABLET | Freq: Two times a day (BID) | ORAL | 0 refills | Status: AC
  Filled 2024-09-23 – 2024-09-24 (×2): qty 7, 4d supply, fill #0

## 2024-09-23 MED ORDER — SODIUM CHLORIDE 0.9 % IV SOLN
150.0000 mg | Freq: Once | INTRAVENOUS | Status: AC
  Administered 2024-09-23: 150 mg via INTRAVENOUS
  Filled 2024-09-23: qty 5
  Filled 2024-09-23: qty 150

## 2024-09-23 MED ORDER — SODIUM CHLORIDE 0.9 % IV SOLN: INTRAVENOUS | Status: DC

## 2024-09-23 MED ORDER — PALONOSETRON HCL INJECTION 0.25 MG/5ML
0.2500 mg | Freq: Once | INTRAVENOUS | Status: AC
  Administered 2024-09-23: 0.25 mg via INTRAVENOUS
  Filled 2024-09-23: qty 5

## 2024-09-23 MED ORDER — DEXAMETHASONE SOD PHOSPHATE PF 10 MG/ML IJ SOLN
10.0000 mg | Freq: Once | INTRAMUSCULAR | Status: AC
  Administered 2024-09-23: 10 mg via INTRAVENOUS
  Filled 2024-09-23: qty 1

## 2024-09-23 MED ORDER — SODIUM CHLORIDE 0.9 % IV SOLN
80.0000 mg/m2 | Freq: Once | INTRAVENOUS | Status: AC
  Administered 2024-09-23: 150 mg via INTRAVENOUS
  Filled 2024-09-23: qty 7.5

## 2024-09-23 MED ORDER — SODIUM CHLORIDE 0.9 % IV SOLN
313.6000 mg | Freq: Once | INTRAVENOUS | Status: AC
  Administered 2024-09-23: 310 mg via INTRAVENOUS
  Filled 2024-09-23: qty 31

## 2024-09-23 NOTE — Patient Instructions (Signed)
 [facility] - A DEPT OF [facility]  Discharge Instructions: Thank you for choosing [facility] to provide your oncology and hematology care.   If you have a lab appointment with the Cancer Center, please go directly to the Cancer Center and check in at the registration area.   Wear comfortable clothing and clothing appropriate for easy access to any Portacath or PICC line.   We strive to give you quality time with your provider. You may need to reschedule your appointment if you arrive late (15 or more minutes).  Arriving late affects you and other patients whose appointments are after yours.  Also, if you miss three or more appointments without notifying the office, you may be dismissed from the clinic at the provider's discretion.      For prescription refill requests, have your pharmacy contact our office and allow 72 hours for refills to be completed.    Today you received the following chemotherapy and/or immunotherapy agents: Carboplatin  (Paraplatin ) & Etoposide  (Vepesid )    To help prevent nausea and vomiting after your treatment, we encourage you to take your nausea medication as directed.  BELOW ARE SYMPTOMS THAT SHOULD BE REPORTED IMMEDIATELY: *FEVER GREATER THAN 100.4 F (38 C) OR HIGHER *CHILLS OR SWEATING *NAUSEA AND VOMITING THAT IS NOT CONTROLLED WITH YOUR NAUSEA MEDICATION *UNUSUAL SHORTNESS OF BREATH *UNUSUAL BRUISING OR BLEEDING *URINARY PROBLEMS (pain or burning when urinating, or frequent urination) *BOWEL PROBLEMS (unusual diarrhea, constipation, pain near the anus) TENDERNESS IN MOUTH AND THROAT WITH OR WITHOUT PRESENCE OF ULCERS (sore throat, sores in mouth, or a toothache) UNUSUAL RASH, SWELLING OR PAIN  UNUSUAL VAGINAL DISCHARGE OR ITCHING   Items with * indicate a potential emergency and should be followed up as soon as possible or go to the Emergency Department if any problems should occur.  Please show the  CHEMOTHERAPY ALERT CARD or IMMUNOTHERAPY ALERT CARD at check-in to the Emergency Department and triage nurse.  Should you have questions after your visit or need to cancel or reschedule your appointment, please contact [facility] - A DEPT OF [facility]  Dept: [phone]  and follow the prompts.  Office hours are 8:00 a.m. to 4:30 p.m. Monday - Friday. Please note that voicemails left after 4:00 p.m. may not be returned until the following business day.  We are closed weekends and major holidays. You have access to a nurse at all times for urgent questions. Please call the main number to the clinic Dept: [phone] and follow the prompts.   For any non-urgent questions, you may also contact your provider using MyChart. We now offer e-Visits for anyone 51 and older to request care online for non-urgent symptoms. For details visit [email or web address].   Also download the MyChart app! Go to the app store, search MyChart, open the app, select [facility], and log in with your MyChart username and password.

## 2024-09-23 NOTE — Telephone Encounter (Signed)
 error

## 2024-09-24 ENCOUNTER — Ambulatory Visit
Admission: RE | Admit: 2024-09-24 | Discharge: 2024-09-24 | Disposition: A | Source: Ambulatory Visit | Attending: Radiation Oncology | Admitting: Radiation Oncology

## 2024-09-24 ENCOUNTER — Ambulatory Visit: Admitting: Family Medicine

## 2024-09-24 ENCOUNTER — Ambulatory Visit

## 2024-09-24 ENCOUNTER — Inpatient Hospital Stay

## 2024-09-24 ENCOUNTER — Other Ambulatory Visit: Payer: Self-pay

## 2024-09-24 ENCOUNTER — Other Ambulatory Visit (HOSPITAL_COMMUNITY): Payer: Self-pay

## 2024-09-24 VITALS — BP 114/60 | HR 59 | Temp 97.7°F | Resp 16

## 2024-09-24 DIAGNOSIS — C7A8 Other malignant neuroendocrine tumors: Secondary | ICD-10-CM

## 2024-09-24 LAB — RAD ONC ARIA SESSION SUMMARY
Course Elapsed Days: 39
Plan Fractions Treated to Date: 23
Plan Prescribed Dose Per Fraction: 2 Gy
Plan Total Fractions Prescribed: 30
Plan Total Prescribed Dose: 60 Gy
Reference Point Dosage Given to Date: 46 Gy
Reference Point Session Dosage Given: 2 Gy
Session Number: 23

## 2024-09-24 MED ORDER — SODIUM CHLORIDE 0.9 % IV SOLN
80.0000 mg/m2 | Freq: Once | INTRAVENOUS | Status: AC
  Administered 2024-09-24: 150 mg via INTRAVENOUS
  Filled 2024-09-24: qty 7.5

## 2024-09-24 MED ORDER — SODIUM CHLORIDE 0.9 % IV SOLN: INTRAVENOUS | Status: DC

## 2024-09-24 MED ORDER — DEXAMETHASONE SOD PHOSPHATE PF 10 MG/ML IJ SOLN
10.0000 mg | Freq: Once | INTRAMUSCULAR | Status: AC
  Administered 2024-09-24: 10 mg via INTRAVENOUS
  Filled 2024-09-24: qty 1

## 2024-09-25 ENCOUNTER — Other Ambulatory Visit (HOSPITAL_COMMUNITY): Payer: Self-pay

## 2024-09-25 ENCOUNTER — Ambulatory Visit: Admitting: Family Medicine

## 2024-09-25 ENCOUNTER — Ambulatory Visit
Admission: RE | Admit: 2024-09-25 | Discharge: 2024-09-25 | Disposition: A | Discharge status: Home / Self Care | Source: Ambulatory Visit | Attending: Radiation Oncology | Admitting: Radiation Oncology

## 2024-09-25 ENCOUNTER — Other Ambulatory Visit: Payer: Self-pay

## 2024-09-25 ENCOUNTER — Inpatient Hospital Stay

## 2024-09-25 ENCOUNTER — Encounter: Payer: Self-pay | Admitting: Family Medicine

## 2024-09-25 VITALS — BP 120/61 | HR 62 | Temp 97.9°F | Resp 17

## 2024-09-25 VITALS — BP 126/72 | HR 64 | Temp 97.6°F | Ht 69.0 in | Wt 164.0 lb

## 2024-09-25 DIAGNOSIS — I10 Essential (primary) hypertension: Secondary | ICD-10-CM

## 2024-09-25 DIAGNOSIS — D701 Agranulocytosis secondary to cancer chemotherapy: Secondary | ICD-10-CM

## 2024-09-25 DIAGNOSIS — T451X5A Adverse effect of antineoplastic and immunosuppressive drugs, initial encounter: Secondary | ICD-10-CM

## 2024-09-25 DIAGNOSIS — E1169 Type 2 diabetes mellitus with other specified complication: Secondary | ICD-10-CM | POA: Diagnosis not present

## 2024-09-25 DIAGNOSIS — E1122 Type 2 diabetes mellitus with diabetic chronic kidney disease: Secondary | ICD-10-CM

## 2024-09-25 DIAGNOSIS — N1831 Chronic kidney disease, stage 3a: Secondary | ICD-10-CM

## 2024-09-25 DIAGNOSIS — Z87891 Personal history of nicotine dependence: Secondary | ICD-10-CM

## 2024-09-25 DIAGNOSIS — E785 Hyperlipidemia, unspecified: Secondary | ICD-10-CM

## 2024-09-25 DIAGNOSIS — C7A8 Other malignant neuroendocrine tumors: Secondary | ICD-10-CM

## 2024-09-25 DIAGNOSIS — Z8619 Personal history of other infectious and parasitic diseases: Secondary | ICD-10-CM

## 2024-09-25 DIAGNOSIS — R600 Localized edema: Secondary | ICD-10-CM | POA: Diagnosis not present

## 2024-09-25 LAB — RAD ONC ARIA SESSION SUMMARY
Course Elapsed Days: 40
Plan Fractions Treated to Date: 24
Plan Prescribed Dose Per Fraction: 2 Gy
Plan Total Fractions Prescribed: 30
Plan Total Prescribed Dose: 60 Gy
Reference Point Dosage Given to Date: 48 Gy
Reference Point Session Dosage Given: 2 Gy
Session Number: 24

## 2024-09-25 MED ORDER — SODIUM CHLORIDE 0.9 % IV SOLN: INTRAVENOUS | Status: DC

## 2024-09-25 MED ORDER — ONETOUCH ULTRASOFT LANCETS MISC
12 refills | Status: AC
  Filled 2024-09-25: qty 100, 90d supply, fill #0

## 2024-09-25 MED ORDER — SODIUM CHLORIDE 0.9 % IV SOLN
80.0000 mg/m2 | Freq: Once | INTRAVENOUS | Status: AC
  Administered 2024-09-25: 150 mg via INTRAVENOUS
  Filled 2024-09-25: qty 7.5

## 2024-09-25 MED ORDER — ACCU-CHEK SOFTCLIX LANCETS MISC
12 refills | Status: DC
  Filled 2024-09-25: qty 100, fill #0

## 2024-09-25 MED ORDER — DEXAMETHASONE SOD PHOSPHATE PF 10 MG/ML IJ SOLN
10.0000 mg | Freq: Once | INTRAMUSCULAR | Status: AC
  Administered 2024-09-25: 10 mg via INTRAVENOUS
  Filled 2024-09-25: qty 1

## 2024-09-25 MED ORDER — ONETOUCH VERIO VI STRP
ORAL_STRIP | 12 refills | Status: AC
  Filled 2024-09-25: qty 100, 100d supply, fill #0

## 2024-09-25 NOTE — Patient Instructions (Signed)
 [facility] - A DEPT OF [facility]  Discharge Instructions: Thank you for choosing [facility] to provide your oncology and hematology care.   If you have a lab appointment with the Cancer Center, please go directly to the Cancer Center and check in at the registration area.   Wear comfortable clothing and clothing appropriate for easy access to any Portacath or PICC line.   We strive to give you quality time with your provider. You may need to reschedule your appointment if you arrive late (15 or more minutes).  Arriving late affects you and other patients whose appointments are after yours.  Also, if you miss three or more appointments without notifying the office, you may be dismissed from the clinic at the provider's discretion.      For prescription refill requests, have your pharmacy contact our office and allow 72 hours for refills to be completed.    Today you received the following chemotherapy and/or immunotherapy agents: Etoposide      To help prevent nausea and vomiting after your treatment, we encourage you to take your nausea medication as directed.  BELOW ARE SYMPTOMS THAT SHOULD BE REPORTED IMMEDIATELY: *FEVER GREATER THAN 100.4 F (38 C) OR HIGHER *CHILLS OR SWEATING *NAUSEA AND VOMITING THAT IS NOT CONTROLLED WITH YOUR NAUSEA MEDICATION *UNUSUAL SHORTNESS OF BREATH *UNUSUAL BRUISING OR BLEEDING *URINARY PROBLEMS (pain or burning when urinating, or frequent urination) *BOWEL PROBLEMS (unusual diarrhea, constipation, pain near the anus) TENDERNESS IN MOUTH AND THROAT WITH OR WITHOUT PRESENCE OF ULCERS (sore throat, sores in mouth, or a toothache) UNUSUAL RASH, SWELLING OR PAIN  UNUSUAL VAGINAL DISCHARGE OR ITCHING   Items with * indicate a potential emergency and should be followed up as soon as possible or go to the Emergency Department if any problems should occur.  Please show the CHEMOTHERAPY ALERT CARD or IMMUNOTHERAPY  ALERT CARD at check-in to the Emergency Department and triage nurse.  Should you have questions after your visit or need to cancel or reschedule your appointment, please contact [facility] - A DEPT OF [facility]  Dept: [phone]  and follow the prompts.  Office hours are 8:00 a.m. to 4:30 p.m. Monday - Friday. Please note that voicemails left after 4:00 p.m. may not be returned until the following business day.  We are closed weekends and major holidays. You have access to a nurse at all times for urgent questions. Please call the main number to the clinic Dept: [phone] and follow the prompts.   For any non-urgent questions, you may also contact your provider using MyChart. We now offer e-Visits for anyone 35 and older to request care online for non-urgent symptoms. For details visit [email or web address].   Also download the MyChart app! Go to the app store, search MyChart, open the app, select [place], and log in with your MyChart username and password.

## 2024-09-25 NOTE — Progress Notes (Signed)
 "  Subjective:     Patient ID: Edward [name], male    DOB: [birth year], 84 y.o.   MRN: [id number]  Chief Complaint  Patient presents with   Hospitalization Follow-up    Feels like he is doing better each day but still has no appetite, hurts to swallow (believes it is due to the chemo)    HPI  Discussed the use of AI scribe software for clinical note transcription with the patient, who gave verbal consent to proceed.  History of Present Illness Edward [name] is an 84 year old male with lung cancer and neutropenia who presents for hospital discharge follow-up and management of chronic health conditions.  Lung cancer and treatment complications - Recent hospitalization for right lower lobe lung cancer treated with chemotherapy and radiation - Complicated by neutropenia - Scheduled for port-a-cath placement this week - Fatigue and cough present - No significant shortness of breath - Leg swelling noted - No chest pain or palpitations  Oropharyngeal dysphagia and odynophagia - Difficulty swallowing with odynophagia - Unable to eat or drink adequately - Loss of taste - Pain at the end of the esophagus - Indigestion - Prescribed potassium supplements and Carafate  for throat symptoms but has not yet received them - Has not tried nutritional supplements such as protein shakes, Boost, or Ensure  Diabetes mellitus - Uncertainty regarding metformin  use after being told to stop - Recent blood glucose readings range from 80 to 205 - Needs glucose monitor strips and lancets for Vero monitor  Depressive symptoms - 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 - Feels stable - Does not want medication - No suicidal ideation  Recent infectious diarrhea - Treated for Clostridioides difficile infection during hospitalization - Significant diarrhea during hospital stay, now resolved  Social impact and support - Reduced church attendance due to concern about exposure to  illness - Support from friends, including one with similar health challenges     Health Maintenance Due  Topic Date Due   Zoster Vaccines- Shingrix (2 of 2) 07/23/2020   DTaP/Tdap/Td (3 - Tdap) 04/08/2024   COVID-19 Vaccine (5 - 2025-26 season) 05/13/2024    Past Medical History:  Diagnosis Date   BPH (benign prostatic hyperplasia)    Cancer (HCC)    Cataract    surgical removal bilateral   CKD (chronic kidney disease) stage 3, GFR 30-59 ml/min (HCC)    DM (diabetes mellitus), type 2 (HCC)    ED (erectile dysfunction)    GERD (gastroesophageal reflux disease)    History of adenomatous polyp of colon 11/02/2017   Tubular adenoma 2013; recommended 5 year follow up   History of kidney stones    Hyperlipidemia    Hypertension    IBS (irritable bowel syndrome)    Kidney stones 02/11/2020   LBBB (left bundle branch block)    Lung nodule    Personal history of kidney stones    Vitamin D  deficiency     Past Surgical History:  Procedure Laterality Date   CATARACT EXTRACTION Right 2015   CATARACT EXTRACTION W/ INTRAOCULAR LENS IMPLANT  2013   left   COLONOSCOPY  2013   TA   ENDOBRONCHIAL ULTRASOUND Bilateral 07/30/2024   Procedure: ENDOBRONCHIAL ULTRASOUND (EBUS);  Surgeon: Isadora Hose, MD;  Location: [facility] ORS;  Service: Pulmonary;  Laterality: Bilateral;   EXTRACORPOREAL SHOCK WAVE LITHOTRIPSY Left 12/11/2017   Procedure: LEFT EXTRACORPOREAL SHOCK WAVE LITHOTRIPSY (ESWL);  Surgeon: Carolee Sherwood JONETTA DOUGLAS, MD;  Location: [facility];  Service: Urology;  Laterality: Left;   KIDNEY STONE SURGERY     POLYPECTOMY     VIDEO BRONCHOSCOPY WITH ENDOBRONCHIAL ULTRASOUND Bilateral 07/18/2024   Procedure: BRONCHOSCOPY, WITH EBUS;  Surgeon: Catherine Cools, MD;  Location: [facility];  Service: Pulmonary;  Laterality: Bilateral;    Family History  Problem Relation Age of Onset   Diabetes Sister    Hypertension Sister    Heart disease Mother    Diabetes Sister    Hypertension Sister     Colon cancer Neg Hx    Colon polyps Neg Hx    Esophageal cancer Neg Hx    Rectal cancer Neg Hx    Stomach cancer Neg Hx     Social History   Socioeconomic History   Marital status: Married    Spouse name: Not on file   Number of children: Not on file   Years of education: Not on file   Highest education level: Not on file  Occupational History   Not on file  Tobacco Use   Smoking status: Former    Current packs/day: 0.00    Average packs/day: 0.5 packs/day for 10.0 years (5.0 ttl pk-yrs)    Types: Cigarettes    Start date: 09/12/1957    Quit date: 09/13/1967    Years since quitting: 57.0    Passive exposure: Past   Smokeless tobacco: Never  Vaping Use   Vaping status: Never Used  Substance and Sexual Activity   Alcohol  use: No   Drug use: No   Sexual activity: Not Currently  Other Topics Concern   Not on file  Social History Narrative   Married   Social Drivers of Health   Tobacco Use: Medium Risk (09/25/2024)   Patient History    Smoking Tobacco Use: Former    Smokeless Tobacco Use: Never    Passive Exposure: Past  Physicist, Medical Strain: Low Risk (01/09/2024)   Overall Financial Resource Strain (CARDIA)    Difficulty of Paying Living Expenses: Not hard at all  Food Insecurity: No Food Insecurity (09/06/2024)   Epic    Worried About Radiation Protection Practitioner of Food in the Last Year: Never true    Ran Out of Food in the Last Year: Never true  Transportation Needs: No Transportation Needs (09/06/2024)   Epic    Lack of Transportation (Medical): No    Lack of Transportation (Non-Medical): No  Physical Activity: Sufficiently Active (01/09/2024)   Exercise Vital Sign    Days of Exercise per Week: 4 days    Minutes of Exercise per Session: 60 min  Stress: No Stress Concern Present (01/09/2024)   [employer] of Occupational Health - Occupational Stress Questionnaire    Feeling of Stress : Not at all  Social Connections: Socially Integrated (09/06/2024)   Social  Connection and Isolation Panel    Frequency of Communication with Friends and Family: Three times a week    Frequency of Social Gatherings with Friends and Family: Three times a week    Attends Religious Services: More than 4 times per year    Active Member of Clubs or Organizations: Yes    Attends Banker Meetings: 1 to 4 times per year    Marital Status: Married  Catering Manager Violence: Not At Risk (09/06/2024)   Epic    Fear of Current or Ex-Partner: No    Emotionally Abused: No    Physically Abused: No    Sexually Abused: No  Depression (PHQ2-9): High Risk (09/25/2024)   Depression (PHQ2-9)  PHQ-2 Score: 17  Alcohol  Screen: Low Risk (01/09/2024)   Alcohol  Screen    Last Alcohol  Screening Score (AUDIT): 0  Housing: Low Risk (09/06/2024)   Epic    Unable to Pay for Housing in the Last Year: No    Number of Times Moved in the Last Year: 0    Homeless in the Last Year: No  Utilities: Not At Risk (09/06/2024)   Epic    Threatened with loss of utilities: No  Health Literacy: Adequate Health Literacy (01/09/2024)   B1300 Health Literacy    Frequency of need for help with medical instructions: Never    Outpatient Medications Prior to Visit  Medication Sig Dispense Refill   acetaminophen  (TYLENOL ) 325 MG tablet Take 2 tablets (650 mg total) by mouth every 6 (six) hours as needed for mild pain (pain score 1-3) or fever (or Fever >/= 101).     albuterol  (ACCUNEB ) 1.25 MG/3ML nebulizer solution Inhale 3 mL (1.25 mg total) by nebulization every 6 (six) hours as needed for wheezing. (Patient taking differently: Take 1 ampule by nebulization See admin instructions. Nebulize 1.25 mg (1 ampule) and inhale into he lungs two times a day and an additional 2 times a day as needed for shortness of breath or wheezing) 75 mL 12   albuterol  (VENTOLIN  HFA) 108 (90 Base) MCG/ACT inhaler Inhale 2 puffs into the lungs every 4 (four) hours as needed for wheezing or shortness of breath. 6.7 g  2   alum & mag hydroxide-simeth (MAALOX/MYLANTA) 200-200-20 MG/5ML suspension Take 15 mLs by mouth every 4 (four) hours as needed for indigestion or heartburn. 355 mL 0   atorvastatin  (LIPITOR) 40 MG tablet Take 1 tablet (40 mg total) by mouth every evening for cholesterol 270 tablet 0   Cholecalciferol (VITAMIN D3) 125 MCG (5000 UT) CAPS Take 5,000 Units by mouth daily.     CINNAMON PO Take 1-2 capsules by mouth daily.     CLARITIN 10 MG tablet Take 10 mg by mouth daily.     Cyanocobalamin (VITAMIN B 12 PO) Take 1 tablet by mouth daily.     famotidine  (PEPCID ) 40 MG tablet Take 1 tablet (40 mg total) by mouth every evening to prevent heartburn and indigestion. (Patient taking differently: Take 40 mg by mouth See admin instructions. Take 40 mg by mouth in the evening as needed for reflux or indigestion) 90 tablet 0   glucose blood test strip Use 1 strip to check glucose once daily. 100 each 12   losartan  (COZAAR ) 50 MG tablet Take 1 tablet (50 mg total) by mouth daily for blood pressure. 90 tablet 0   metoprolol  tartrate (LOPRESSOR ) 25 MG tablet Take 0.5 tablets (12.5 mg total) by mouth daily at 2 am. 90 tablet 0   OMEGA-3 FATTY ACIDS PO Take 1 capsule by mouth 3 (three) times a week.     ondansetron  (ZOFRAN ) 8 MG tablet Take 1 tablet (8 mg total) by mouth every 8 (eight) hours as needed for nausea or vomiting. Start on third day after chemotherapy. 30 tablet 1   potassium chloride  SA (KLOR-CON  M) 20 MEQ tablet Take 1 tablet (20 mEq total) by mouth 2 (two) times daily. 7 tablet 0   prochlorperazine  (COMPAZINE ) 10 MG tablet Take 1 tablet (10 mg total) by mouth every 6 (six) hours as needed for nausea or vomiting (Nausea or vomiting). 30 tablet 1   sucralfate  (CARAFATE ) 1 g tablet Take 1 tablet (1 g total) by mouth 4 (four) times  daily -  with meals and at bedtime. 120 tablet 0   SYSTANE COMPLETE PF 0.6 % SOLN Place 1 drop into both eyes 3 (three) times daily as needed (for dryness or irritation).      tamsulosin  (FLOMAX ) 0.4 MG CAPS capsule Take 1 capsule (0.4 mg total) by mouth every evening for prostate. 90 capsule 0   zinc gluconate 50 MG tablet Take 50 mg by mouth daily.     glucose blood (ONETOUCH VERIO) test strip USE TO TEST BLOOD SUGAR ONCE DAILY 100 strip 12   No facility-administered medications prior to visit.    Allergies[1]  ROS Per HPI    Objective:    Physical Exam Constitutional:      General: He is not in acute distress.    Appearance: He is ill-appearing.     Comments: Slightly frail appearing   Eyes:     Extraocular Movements: Extraocular movements intact.     Conjunctiva/sclera: Conjunctivae normal.  Cardiovascular:     Rate and Rhythm: Normal rate and regular rhythm.  Pulmonary:     Effort: Pulmonary effort is normal.     Comments: Decreased in bases Musculoskeletal:     Cervical back: Normal range of motion and neck supple.     Right lower leg: Edema present.     Left lower leg: Edema present.  Skin:    General: Skin is warm and dry.  Neurological:     General: No focal deficit present.     Mental Status: He is alert and oriented to person, place, and time.     Motor: No weakness.     Coordination: Coordination normal.     Gait: Gait normal.  Psychiatric:        Mood and Affect: Mood normal.        Behavior: Behavior normal.        Thought Content: Thought content normal.      BP 126/72   Pulse 64   Temp 97.6 F (36.4 C) (Temporal)   Ht 5' 9 (1.753 m)   Wt 164 lb (74.4 kg)   SpO2 98%   BMI 24.22 kg/m  Wt Readings from Last 3 Encounters:  09/25/24 164 lb (74.4 kg)  09/23/24 160 lb (72.6 kg)  09/06/24 156 lb 8.4 oz (71 kg)       Assessment & Plan:   Problem List Items Addressed This Visit     Chemotherapy induced neutropenia   Essential hypertension   Former smoker   Hyperlipidemia associated with type 2 diabetes mellitus (HCC)   Type 2 diabetes mellitus with stage 3 chronic kidney disease, without long-term current use of  insulin  (HCC) - Primary   Relevant Medications   glucose blood (ONETOUCH VERIO) test strip   Lancets (ONETOUCH ULTRASOFT) lancets   Other Visit Diagnoses       History of sepsis         Bilateral lower extremity edema           Assessment and Plan Assessment & Plan Chemotherapy-induced neutropenia secondary to right lower lobe lung cancer Neutropenia secondary to chemotherapy for right lower lobe lung cancer. No recent blood transfusions required. Chemotherapy dose adjustment planned by oncologist. Port-a-cath placement scheduled for this week. - Continue chemotherapy as per oncologist's plan - Ensure port-a-cath placement is completed as scheduled  Radiation-induced esophagitis Causing dysphagia and odynophagia. Last day of radiation scheduled for January 27th. Carafate  prescribed but not yet obtained from pharmacy. - Check with pharmacy for Carafate  availability -  Continue radiation therapy as scheduled  Type 2 diabetes mellitus with stage 3a chronic kidney disease Blood glucose levels fluctuating, with recent readings of 205 and 80. Glucose monitoring required. - Prescribed glucose monitor strips and lancets - Avoid metformin  as per oncologist's advice  Bilateral lower extremity edema Managed with leg elevation and compression socks. Advised to monitor sodium intake and increase protein intake. - Elevate legs when sitting - Use compression socks - Monitor sodium intake - Increase protein intake, consider protein shakes like Boost or Atkins  History of Clostridioides difficile infection Recent C. difficile infection treated successfully.     I am having Hoa R. Jarchow Schey start on onetouch ultrasoft. I am also having him maintain his CINNAMON PO, zinc gluconate, Cyanocobalamin (VITAMIN B 12 PO), OMEGA-3 FATTY ACIDS PO, famotidine , tamsulosin , atorvastatin , glucose blood, losartan , albuterol , Vitamin D3, Systane Complete PF, albuterol , ondansetron ,  prochlorperazine , Claritin, acetaminophen , alum & mag hydroxide-simeth, metoprolol  tartrate, sucralfate , potassium chloride  SA, and OneTouch Verio.  Meds ordered this encounter  Medications   glucose blood (ONETOUCH VERIO) test strip    Sig: USE TO TEST BLOOD SUGAR ONCE DAILY    Dispense:  100 strip    Refill:  12    Or other compatible brand, does need fill today E11.65   DISCONTD: Accu-Chek Softclix Lancets lancets    Sig: Use as instructed    Dispense:  100 each    Refill:  12   Lancets (ONETOUCH ULTRASOFT) lancets    Sig: Use as instructed    Dispense:  100 each    Refill:  12       [1]  Allergies Allergen Reactions   Bystolic [Nebivolol Hcl] Other (See Comments)    Reaction not recalled   "

## 2024-09-25 NOTE — Patient Instructions (Signed)
" °  Check with your pharmacy about the Carafate  and potassium supplements.    "

## 2024-09-26 ENCOUNTER — Other Ambulatory Visit: Payer: Self-pay

## 2024-09-26 ENCOUNTER — Ambulatory Visit (HOSPITAL_COMMUNITY)

## 2024-09-26 ENCOUNTER — Other Ambulatory Visit (HOSPITAL_COMMUNITY)

## 2024-09-26 ENCOUNTER — Ambulatory Visit
Admission: RE | Admit: 2024-09-26 | Discharge: 2024-09-26 | Disposition: A | Source: Ambulatory Visit | Attending: Radiation Oncology | Admitting: Radiation Oncology

## 2024-09-26 LAB — RAD ONC ARIA SESSION SUMMARY
Course Elapsed Days: 41
Plan Fractions Treated to Date: 25
Plan Prescribed Dose Per Fraction: 2 Gy
Plan Total Fractions Prescribed: 30
Plan Total Prescribed Dose: 60 Gy
Reference Point Dosage Given to Date: 50 Gy
Reference Point Session Dosage Given: 2 Gy
Session Number: 25

## 2024-09-27 ENCOUNTER — Other Ambulatory Visit: Payer: Self-pay

## 2024-09-27 ENCOUNTER — Ambulatory Visit
Admission: RE | Admit: 2024-09-27 | Discharge: 2024-09-27 | Disposition: A | Discharge status: Home / Self Care | Source: Ambulatory Visit | Attending: Radiation Oncology | Admitting: Radiation Oncology

## 2024-09-27 LAB — RAD ONC ARIA SESSION SUMMARY
Course Elapsed Days: 42
Plan Fractions Treated to Date: 26
Plan Prescribed Dose Per Fraction: 2 Gy
Plan Total Fractions Prescribed: 30
Plan Total Prescribed Dose: 60 Gy
Reference Point Dosage Given to Date: 52 Gy
Reference Point Session Dosage Given: 2 Gy
Session Number: 26

## 2024-09-30 ENCOUNTER — Inpatient Hospital Stay

## 2024-09-30 ENCOUNTER — Other Ambulatory Visit (HOSPITAL_COMMUNITY): Payer: Self-pay

## 2024-09-30 ENCOUNTER — Other Ambulatory Visit: Payer: Self-pay

## 2024-09-30 ENCOUNTER — Other Ambulatory Visit: Payer: Self-pay | Admitting: Physician Assistant

## 2024-09-30 ENCOUNTER — Ambulatory Visit
Admission: RE | Admit: 2024-09-30 | Discharge: 2024-09-30 | Disposition: A | Source: Ambulatory Visit | Attending: Radiation Oncology | Admitting: Radiation Oncology

## 2024-09-30 ENCOUNTER — Telehealth: Payer: Self-pay

## 2024-09-30 ENCOUNTER — Inpatient Hospital Stay: Admitting: Nurse Practitioner

## 2024-09-30 ENCOUNTER — Encounter: Payer: Self-pay | Admitting: Nurse Practitioner

## 2024-09-30 VITALS — BP 128/83 | HR 74 | Temp 98.2°F | Resp 18 | Ht 69.0 in | Wt 154.0 lb

## 2024-09-30 DIAGNOSIS — R630 Anorexia: Secondary | ICD-10-CM

## 2024-09-30 DIAGNOSIS — R530 Neoplastic (malignant) related fatigue: Secondary | ICD-10-CM

## 2024-09-30 DIAGNOSIS — R634 Abnormal weight loss: Secondary | ICD-10-CM | POA: Diagnosis not present

## 2024-09-30 DIAGNOSIS — C7A8 Other malignant neuroendocrine tumors: Secondary | ICD-10-CM | POA: Diagnosis not present

## 2024-09-30 DIAGNOSIS — D701 Agranulocytosis secondary to cancer chemotherapy: Secondary | ICD-10-CM

## 2024-09-30 DIAGNOSIS — Z515 Encounter for palliative care: Secondary | ICD-10-CM

## 2024-09-30 DIAGNOSIS — E876 Hypokalemia: Secondary | ICD-10-CM

## 2024-09-30 DIAGNOSIS — Z7189 Other specified counseling: Secondary | ICD-10-CM

## 2024-09-30 LAB — CBC WITH DIFFERENTIAL (CANCER CENTER ONLY)
Abs Immature Granulocytes: 0.03 K/uL (ref 0.00–0.07)
Basophils Absolute: 0 K/uL (ref 0.0–0.1)
Basophils Relative: 1 %
Eosinophils Absolute: 0 K/uL (ref 0.0–0.5)
Eosinophils Relative: 1 %
HCT: 31.3 % — ABNORMAL LOW (ref 39.0–52.0)
Hemoglobin: 10.2 g/dL — ABNORMAL LOW (ref 13.0–17.0)
Immature Granulocytes: 2 %
Lymphocytes Relative: 8 %
Lymphs Abs: 0.1 K/uL — ABNORMAL LOW (ref 0.7–4.0)
MCH: 28.5 pg (ref 26.0–34.0)
MCHC: 32.6 g/dL (ref 30.0–36.0)
MCV: 87.4 fL (ref 80.0–100.0)
Monocytes Absolute: 0 K/uL — ABNORMAL LOW (ref 0.1–1.0)
Monocytes Relative: 1 %
Neutro Abs: 1.5 K/uL — ABNORMAL LOW (ref 1.7–7.7)
Neutrophils Relative %: 87 %
Platelet Count: 161 K/uL (ref 150–400)
RBC: 3.58 MIL/uL — ABNORMAL LOW (ref 4.22–5.81)
RDW: 14.6 % (ref 11.5–15.5)
WBC Count: 1.7 K/uL — ABNORMAL LOW (ref 4.0–10.5)
nRBC: 0 % (ref 0.0–0.2)

## 2024-09-30 LAB — RAD ONC ARIA SESSION SUMMARY
Course Elapsed Days: 45
Plan Fractions Treated to Date: 27
Plan Prescribed Dose Per Fraction: 2 Gy
Plan Total Fractions Prescribed: 30
Plan Total Prescribed Dose: 60 Gy
Reference Point Dosage Given to Date: 54 Gy
Reference Point Session Dosage Given: 2 Gy
Session Number: 27

## 2024-09-30 LAB — CMP (CANCER CENTER ONLY)
ALT: 8 U/L (ref 0–44)
AST: 14 U/L — ABNORMAL LOW (ref 15–41)
Albumin: 3.4 g/dL — ABNORMAL LOW (ref 3.5–5.0)
Alkaline Phosphatase: 70 U/L (ref 38–126)
Anion gap: 8 (ref 5–15)
BUN: 10 mg/dL (ref 8–23)
CO2: 27 mmol/L (ref 22–32)
Calcium: 8.3 mg/dL — ABNORMAL LOW (ref 8.9–10.3)
Chloride: 102 mmol/L (ref 98–111)
Creatinine: 1.03 mg/dL (ref 0.61–1.24)
GFR, Estimated: >60 mL/min
Glucose, Bld: 182 mg/dL — ABNORMAL HIGH (ref 70–99)
Potassium: 4 mmol/L (ref 3.5–5.1)
Sodium: 137 mmol/L (ref 135–145)
Total Bilirubin: 0.5 mg/dL (ref 0.0–1.2)
Total Protein: 5.8 g/dL — ABNORMAL LOW (ref 6.5–8.1)

## 2024-09-30 LAB — SAMPLE TO BLOOD BANK

## 2024-09-30 NOTE — Telephone Encounter (Signed)
 Spoke w wife and was able to relay lab results, she verbalized understanding and reports he has not had any bleeding

## 2024-09-30 NOTE — Telephone Encounter (Signed)
 Called back pt wife and went over results

## 2024-09-30 NOTE — Telephone Encounter (Signed)
 Copied from CRM [id number]. Topic: Clinical - Lab/Test Results >> Sep 30, 2024 11:35 AM Maisie BROCKS wrote: Reason for CRM: pt's spouse stated she missed a call from [facility] about his recent lab results. No note or documentation. Please advise.

## 2024-09-30 NOTE — Progress Notes (Signed)
 "    Palliative Medicine [facility]  [phone] [phone]   Name: Edward [name] Date: 09/30/2024 MRN: [id number]  DOB: Aug 29, 1941  Patient Care Team: [name], NP-C as PCP - General (Family Medicine) Leslee Reusing, MD as Consulting Physician (Ophthalmology) Debrah Lamar BIRCH, MD (Inactive) as Consulting Physician (Gastroenterology) Carolee Sherwood BIRCH DOUGLAS, MD as Consulting Physician (Urology) Nathanael Davies, [facility] (Inactive) as Pharmacist (Pharmacist) Prentis Duwaine BROCKS, RN as Oncology Nurse Navigator    REASON FOR CONSULTATION: Edward [name] is a 84 y.o. male with oncologic medical history including stage III well-differentiated neuroendocrine tumor (November 2025) currently undergoing systemic chemotherapy with carboplatin  and etoposide  concurrent with radiotherapy.  Palliative is seeing patient for symptom management and goals of care.    SOCIAL HISTORY:     reports that he quit smoking about 57 years ago. His smoking use included cigarettes. He started smoking about 67 years ago. He has a 5 pack-year smoking history. He has been exposed to tobacco smoke. He has never used smokeless tobacco. He reports that he does not drink alcohol  and does not use drugs.  ADVANCE DIRECTIVES:  MOST form on file.  Patient has a documented advanced directive with plans to bring in at follow-up visit.  Edward [name] is his medical decision-maker in the event he cannot speak for himself.  CODE STATUS: DNR  PAST MEDICAL HISTORY: Past Medical History:  Diagnosis Date   BPH (benign prostatic hyperplasia)    Cancer (HCC)    Cataract    surgical removal bilateral   CKD (chronic kidney disease) stage 3, GFR 30-59 ml/min (HCC)    DM (diabetes mellitus), type 2 (HCC)    ED (erectile dysfunction)    GERD (gastroesophageal reflux disease)    History of adenomatous polyp of colon 11/02/2017   Tubular adenoma 2013; recommended 5 year follow up   History of  kidney stones    Hyperlipidemia    Hypertension    IBS (irritable bowel syndrome)    Kidney stones 02/11/2020   LBBB (left bundle branch block)    Lung nodule    Personal history of kidney stones    Vitamin D  deficiency     PAST SURGICAL HISTORY:  Past Surgical History:  Procedure Laterality Date   CATARACT EXTRACTION Right 2015   CATARACT EXTRACTION W/ INTRAOCULAR LENS IMPLANT  2013   left   COLONOSCOPY  2013   TA   ENDOBRONCHIAL ULTRASOUND Bilateral 07/30/2024   Procedure: ENDOBRONCHIAL ULTRASOUND (EBUS);  Surgeon: Isadora Hose, MD;  Location: [facility] ORS;  Service: Pulmonary;  Laterality: Bilateral;   EXTRACORPOREAL SHOCK WAVE LITHOTRIPSY Left 12/11/2017   Procedure: LEFT EXTRACORPOREAL SHOCK WAVE LITHOTRIPSY (ESWL);  Surgeon: Carolee Sherwood BIRCH DOUGLAS, MD;  Location: [facility] ORS;  Service: Urology;  Laterality: Left;   KIDNEY STONE SURGERY     POLYPECTOMY     VIDEO BRONCHOSCOPY WITH ENDOBRONCHIAL ULTRASOUND Bilateral 07/18/2024   Procedure: BRONCHOSCOPY, WITH EBUS;  Surgeon: Catherine Cools, MD;  Location: [facility] ENDOSCOPY;  Service: Pulmonary;  Laterality: Bilateral;    HEMATOLOGY/ONCOLOGY HISTORY:  Oncology History  Primary malignant neuroendocrine neoplasm of lung (HCC)  08/05/2024 Initial Diagnosis   Primary malignant neuroendocrine neoplasm of lung (HCC)   08/05/2024 Cancer Staging   Staging form: Lung, AJCC V9 - Clinical: Stage IIIC (cT3, cN3, cM0) - Signed by Sherrod Sherrod, MD on 08/05/2024 Method of lymph node assessment: Clinical   08/28/2024 -  Chemotherapy   Patient is on Treatment Plan : LUNG SMALL CELL Carboplatin  (  AUC 5) D1 + Etoposide  (100) D1-3 q21d       ALLERGIES:  is allergic to bystolic [nebivolol hcl].  MEDICATIONS:  Current Outpatient Medications  Medication Sig Dispense Refill   acetaminophen  (TYLENOL ) 325 MG tablet Take 2 tablets (650 mg total) by mouth every 6 (six) hours as needed for mild pain (pain score 1-3) or fever (or Fever >/= 101).     albuterol   (ACCUNEB ) 1.25 MG/3ML nebulizer solution Inhale 3 mL (1.25 mg total) by nebulization every 6 (six) hours as needed for wheezing. (Patient taking differently: Take 1 ampule by nebulization See admin instructions. Nebulize 1.25 mg (1 ampule) and inhale into he lungs two times a day and an additional 2 times a day as needed for shortness of breath or wheezing) 75 mL 12   albuterol  (VENTOLIN  HFA) 108 (90 Base) MCG/ACT inhaler Inhale 2 puffs into the lungs every 4 (four) hours as needed for wheezing or shortness of breath. 6.7 g 2   alum & mag hydroxide-simeth (MAALOX/MYLANTA) 200-200-20 MG/5ML suspension Take 15 mLs by mouth every 4 (four) hours as needed for indigestion or heartburn. 355 mL 0   atorvastatin  (LIPITOR) 40 MG tablet Take 1 tablet (40 mg total) by mouth every evening for cholesterol 270 tablet 0   Cholecalciferol (VITAMIN D3) 125 MCG (5000 UT) CAPS Take 5,000 Units by mouth daily.     CINNAMON PO Take 1-2 capsules by mouth daily.     CLARITIN 10 MG tablet Take 10 mg by mouth daily.     Cyanocobalamin (VITAMIN B 12 PO) Take 1 tablet by mouth daily.     famotidine  (PEPCID ) 40 MG tablet Take 1 tablet (40 mg total) by mouth every evening to prevent heartburn and indigestion. (Patient taking differently: Take 40 mg by mouth See admin instructions. Take 40 mg by mouth in the evening as needed for reflux or indigestion) 90 tablet 0   glucose blood (ONETOUCH VERIO) test strip USE TO TEST BLOOD SUGAR ONCE DAILY 100 strip 12   glucose blood test strip Use 1 strip to check glucose once daily. 100 each 12   Lancets (ONETOUCH ULTRASOFT) lancets Use as instructed 100 each 12   losartan  (COZAAR ) 50 MG tablet Take 1 tablet (50 mg total) by mouth daily for blood pressure. 90 tablet 0   metoprolol  tartrate (LOPRESSOR ) 25 MG tablet Take 0.5 tablets (12.5 mg total) by mouth daily at 2 am. 90 tablet 0   OMEGA-3 FATTY ACIDS PO Take 1 capsule by mouth 3 (three) times a week.     ondansetron  (ZOFRAN ) 8 MG tablet  Take 1 tablet (8 mg total) by mouth every 8 (eight) hours as needed for nausea or vomiting. Start on third day after chemotherapy. 30 tablet 1   potassium chloride  SA (KLOR-CON  M) 20 MEQ tablet Take 1 tablet (20 mEq total) by mouth 2 (two) times daily. 7 tablet 0   prochlorperazine  (COMPAZINE ) 10 MG tablet Take 1 tablet (10 mg total) by mouth every 6 (six) hours as needed for nausea or vomiting (Nausea or vomiting). 30 tablet 1   sucralfate  (CARAFATE ) 1 g tablet Take 1 tablet (1 g total) by mouth 4 (four) times daily -  with meals and at bedtime. 120 tablet 0   SYSTANE COMPLETE PF 0.6 % SOLN Place 1 drop into both eyes 3 (three) times daily as needed (for dryness or irritation).     tamsulosin  (FLOMAX ) 0.4 MG CAPS capsule Take 1 capsule (0.4 mg total) by mouth every evening  for prostate. 90 capsule 0   zinc gluconate 50 MG tablet Take 50 mg by mouth daily.     No current facility-administered medications for this visit.    VITAL SIGNS: BP 128/83 (BP Location: Right Arm, Patient Position: Sitting)   Pulse 74   Temp 98.2 F (36.8 C) (Temporal)   Resp 18   Ht 5' 9 (1.753 m)   Wt 154 lb (69.9 kg)   SpO2 97%   BMI 22.74 kg/m  Filed Weights   09/30/24 1417  Weight: 154 lb (69.9 kg)    Estimated body mass index is 22.74 kg/m as calculated from the following:   Height as of this encounter: 5' 9 (1.753 m).   Weight as of this encounter: 154 lb (69.9 kg).  LABS: CBC:    Component Value Date/Time   WBC 1.7 (L) 09/30/2024 1355   WBC 3.2 (L) 09/15/2024 0618   HGB 10.2 (L) 09/30/2024 1355   HCT 31.3 (L) 09/30/2024 1355   PLT 161 09/30/2024 1355   MCV 87.4 09/30/2024 1355   NEUTROABS 1.5 (L) 09/30/2024 1355   LYMPHSABS 0.1 (L) 09/30/2024 1355   MONOABS 0.0 (L) 09/30/2024 1355   EOSABS 0.0 09/30/2024 1355   BASOSABS 0.0 09/30/2024 1355   Comprehensive Metabolic Panel:    Component Value Date/Time   NA 137 09/30/2024 1355   K 4.0 09/30/2024 1355   CL 102 09/30/2024 1355   CO2 27  09/30/2024 1355   BUN 10 09/30/2024 1355   CREATININE 1.03 09/30/2024 1355   CREATININE 1.34 (H) 08/22/2023 1156   GLUCOSE 182 (H) 09/30/2024 1355   CALCIUM  8.3 (L) 09/30/2024 1355   AST 14 (L) 09/30/2024 1355   ALT 8 09/30/2024 1355   ALKPHOS 70 09/30/2024 1355   BILITOT 0.5 09/30/2024 1355   PROT 5.8 (L) 09/30/2024 1355   ALBUMIN 3.4 (L) 09/30/2024 1355    RADIOGRAPHIC STUDIES: VAS US  LOWER EXTREMITY VENOUS (DVT) Result Date: 09/18/2024  Lower Venous DVT Study Patient Name:  Edward [name]  Date of Exam:   09/18/2024 Medical Rec #: [id number]          Accession #:    [id number] Date of Birth: 01-19-1941          Patient Gender: M Patient Age:   10 years Exam Location:  [address] Procedure:      VAS US  LOWER EXTREMITY VENOUS (DVT) Referring Phys: LYNWOOD NASUTI --------------------------------------------------------------------------------  Indications: Edema.  Risk Factors: Cancer lung. Performing Technologist: Stoney Ross RVT  Examination Guidelines: A complete evaluation includes B-mode imaging, spectral Doppler, color Doppler, and power Doppler as needed of all accessible portions of each vessel. Bilateral testing is considered an integral part of a complete examination. Limited examinations for reoccurring indications may be performed as noted. The reflux portion of the exam is performed with the patient in reverse Trendelenburg.  +---------+---------------+---------+-----------+----------+--------------+ RIGHT    CompressibilityPhasicitySpontaneityPropertiesThrombus Aging +---------+---------------+---------+-----------+----------+--------------+ CFV      Full           Yes      Yes                                 +---------+---------------+---------+-----------+----------+--------------+ SFJ      Full                    Yes                                 +---------+---------------+---------+-----------+----------+--------------+  FV Prox  Full                     Yes                                 +---------+---------------+---------+-----------+----------+--------------+ FV Mid   Full           Yes      Yes                                 +---------+---------------+---------+-----------+----------+--------------+ FV DistalFull                    Yes                                 +---------+---------------+---------+-----------+----------+--------------+ PFV      Full                    Yes                                 +---------+---------------+---------+-----------+----------+--------------+ POP      Full           Yes      Yes                                 +---------+---------------+---------+-----------+----------+--------------+ PTV      Full                    Yes                                 +---------+---------------+---------+-----------+----------+--------------+ PERO     Full                    Yes                                 +---------+---------------+---------+-----------+----------+--------------+ Gastroc  Full                                                        +---------+---------------+---------+-----------+----------+--------------+ GSV      Full                    Yes                                 +---------+---------------+---------+-----------+----------+--------------+  +---------+---------------+---------+-----------+----------+--------------+ LEFT     CompressibilityPhasicitySpontaneityPropertiesThrombus Aging +---------+---------------+---------+-----------+----------+--------------+ CFV      Full           Yes      Yes                                 +---------+---------------+---------+-----------+----------+--------------+ SFJ      Full  Yes                                 +---------+---------------+---------+-----------+----------+--------------+ FV Prox  Full                    Yes                                  +---------+---------------+---------+-----------+----------+--------------+ FV Mid   Full           Yes      Yes                                 +---------+---------------+---------+-----------+----------+--------------+ FV DistalFull                    Yes                                 +---------+---------------+---------+-----------+----------+--------------+ PFV      Full                    Yes                                 +---------+---------------+---------+-----------+----------+--------------+ POP      Full           Yes      Yes                                 +---------+---------------+---------+-----------+----------+--------------+ PTV      Full                    Yes                                 +---------+---------------+---------+-----------+----------+--------------+ PERO     Full                    Yes                                 +---------+---------------+---------+-----------+----------+--------------+ Gastroc  Full                                                        +---------+---------------+---------+-----------+----------+--------------+ GSV      Full                    Yes                                 +---------+---------------+---------+-----------+----------+--------------+  Findings reported to Preliminary report routed to Dr. Shannon at 445.  Summary: RIGHT: - There is no evidence of deep vein thrombosis in the lower extremity. - There is no evidence of superficial venous thrombosis.  - No cystic structure found  in the popliteal fossa.  LEFT: - There is no evidence of deep vein thrombosis in the lower extremity. - There is no evidence of superficial venous thrombosis.  - No cystic structure found in the popliteal fossa.  *Interstitial fluid noted in the bilateral calves. *See table(s) above for measurements and observations. Electronically signed by Gaile New MD on 09/18/2024 at 7:09:57 PM.    Final    DG CHEST  PORT 1 VIEW Result Date: 09/10/2024 CLINICAL DATA:  Shortness of breath. EXAM: PORTABLE CHEST 1 VIEW COMPARISON:  Chest CTA dated 09/06/2024 and portable chest dated 09/06/2024. FINDINGS: Stable enlarged cardiac silhouette. Improved inspiration with increased prominence of the interstitial markings, including Kerley lines. Mild increase in size of a small to moderate-sized right pleural effusion. No significant change in patchy and linear opacity at the right lung base. Thoracic spine degenerative changes. IMPRESSION: 1. Stable cardiomegaly with interval interstitial pulmonary edema. 2. Mild increase in size of a small to moderate-sized right pleural effusion. 3. No significant change in right basilar atelectasis and possible pneumonia. Electronically Signed   By: Elspeth Bathe M.D.   On: 09/10/2024 16:31   DG ESOPHAGUS W SINGLE CM (SOL OR THIN BA) Result Date: 09/09/2024 EXAM: SINGLE CONTRAST ESOPHAGRAM 09/09/2024 12:35:45 PM TECHNIQUE: Multiple single contrast images of the esophagus and gastroesophageal junction were obtained following the oral administration of water soluble contrast. The patient had difficulty standing on today's exam and was not able to assume various standard positions and turning employed in routine esophagram. The protocol was modified including using LPO images and skipping the pharyngeal phase assessment in order to accommodate his ability. FLUOROSCOPY DOSE AND TYPE: Radiation Dose Index: Reference Air Kerma (in mGy) = COMPARISON: None available. CLINICAL HISTORY: Dysphagia. FINDINGS: On the single contrast images, there is disruption of primary peristaltic waves in the proximal to mid esophagus on 3 out of 3 swallows compatible with esophageal dysmotility. No definite stricture identified. No luminal wall irregularity is appreciated with the understanding that today's examination was single contrast and has some limitations related to patient mobility. The 13 mm barium tablet had  transient delay in the small hiatal hernia but with a second swallow of water, passed into the intraabdominal portion of the stomach. A small type 1 hiatal hernia is observed. No evidence of leak. No evidence of achalasia. No gastroesophageal reflux was elicited during examination. IMPRESSION: 1. Nonspecific esophageal dysmotility disorder, with disruption of primary peristaltic waves in the proximal to mid esophagus. 2. Small type 1 hiatal hernia. Electronically signed by: Ryan Salvage MD 09/09/2024 01:05 PM EST RP Workstation: [id number]   DG Abd Portable 1V Result Date: 09/07/2024 CLINICAL DATA:  Intractable nausea and vomiting. EXAM: PORTABLE ABDOMEN - 1 VIEW COMPARISON:  PET CT 07/22/2024 FINDINGS: No gaseous small bowel distension or evidence of obstruction. Slight increased air within transverse colon. Only minimal formed stool in the colon. Right renal stone on prior PET CT is not well demonstrated on the current exam. Excreted IV contrast in the urinary bladder. Degenerative change of both hips. IMPRESSION: No bowel obstruction. Air throughout the colon can be seen with diarrheal process. Electronically Signed   By: Andrea Gasman M.D.   On: 09/07/2024 17:04   CT Angio Chest PE W and/or Wo Contrast Result Date: 09/06/2024 EXAM: CTA CHEST 09/06/2024 02:14:40 PM TECHNIQUE: CTA of the chest was performed without and with the administration of 75 mL of iohexol  (OMNIPAQUE ) 350 MG/ML injection. Multiplanar reformatted images are provided for review. MIP images are provided  for review. Automated exposure control, iterative reconstruction, and/or weight based adjustment of the mA/kV was utilized to reduce the radiation dose to as low as reasonably achievable. COMPARISON: 08/15/2024 CLINICAL HISTORY: Pulmonary embolism (PE) suspected, low to intermediate prob, positive D-dimer. FINDINGS: PULMONARY ARTERIES: Pulmonary arteries are adequately opacified for evaluation. No acute pulmonary embolus. Main  pulmonary artery is normal in caliber. Similar narrowing of the right middle and right lower lobe pulmonary arteries due to the infrahilar mass. MEDIASTINUM: Similar cardiomegaly. LAD atherosclerosis. There is no acute abnormality of the thoracic aorta. LYMPH NODES: Unchanged left paratracheal lymph node measuring approximately 1.3 cm in short axis dimension. No hilar or axillary lymphadenopathy. LUNGS AND PLEURA: Overall, similar appearance of the lobular right infrahilar mass, measuring 7.2 x 6.2 cm. The airspace consolidation in the right lower lobe has slightly decreased in size in the interim. The pleural effusion is similar in size to the prior exam. Right middle lobe bronchus is occluded with similar consolidation in the right middle lobe. Posterior dependent atelectasis in the left lower lobe. No pneumothorax. UPPER ABDOMEN: No acute abnormality within the partially visualized upper abdomen. SOFT TISSUES AND BONES: Multilevel degenerative disc disease of the spine. No acute soft tissue abnormality. IMPRESSION: 1. No evidence of pulmonary embolism. 2. Lobular right infrahilar mass measuring 7.2 x 6.2 cm, similar to prior study, causing narrowing of the right middle and right lower lobe pulmonary arteries. 3. Slightly decreased airspace consolidation in the right lower lobe compared to prior study with otherwise similar size of the right pleural effusion. Similar appearance of the right middle lobe airspace consolidation. Electronically signed by: Rogelia Myers MD 09/06/2024 02:46 PM EST RP Workstation: [id number]   DG Chest Portable 1 View Result Date: 09/06/2024 CLINICAL DATA:  Chest pain EXAM: PORTABLE CHEST 1 VIEW COMPARISON:  August 14, 2024 FINDINGS: Stable cardiomegaly. Stable right basilar opacity concerning for pneumonia or atelectasis with possible small right pleural effusion. Bony thorax is unremarkable. IMPRESSION: Stable right basilar opacity as described above. Electronically Signed   By:  Lynwood Landy Raddle M.D.   On: 09/06/2024 13:58    PERFORMANCE STATUS (ECOG) : 1 - Symptomatic but completely ambulatory  Review of Systems  Constitutional:  Positive for activity change, appetite change, fatigue and unexpected weight change.  Musculoskeletal:  Positive for arthralgias and back pain.  Unless otherwise noted, a complete review of systems is negative.  Physical Exam General: NAD, hard of hearing Cardiovascular: regular rate and rhythm Pulmonary: clear ant fields Abdomen: soft, nontender, + bowel sounds Extremities: no edema, no joint deformities Skin: no rashes Neurological: Alert and oriented x3  Discussed the use of AI scribe software for clinical note transcription with the patient, who gave verbal consent to proceed.  History of Present Illness Edward [name] Marcellus is an 84 year old male with stage IIIc well-differentiated neuroendocrine tumor who presents to clinic for his initial palliative visit.  He is accompanied by his wife, Edward [name].  I introduced myself, Maygan RN, and Palliative's role in collaboration with the oncology team. Concept of Palliative Care was introduced as specialized medical care for people and their families living with serious illness.  It focuses on providing relief from the symptoms and stress of a serious illness.  The goal is to improve quality of life for both the patient and the family. Values and goals of care important to patient and family were attempted to be elicited.  Patient lives in the home with his wife of more than 55 years.  He has 2 children.  Retired from research scientist (medical).  No issues with constipation or diarrhea. No pain outside of his throat discomfort. He sleeps well at night but has low energy levels, spending most of the day in his room attributing his fatigue to current chemoradiation treatments.  He has persistent difficulty swallowing. He also experiences changes in taste, with an inability to taste food. No  sores or white patches are present in his mouth, and he has not noticed any signs of thrush or mucositis.  He has lost 10 pounds in less than two weeks, with his current weight at 154 pounds, down from 164 pounds on January 13th.  Although there could be some scale discrepancy and previous weight averaging 160-164.  All his appetite is poor, and he struggles to consume certain foods. For breakfast, he managed to eat toast and an egg, but sausages were difficult to swallow. For lunch, he had difficulty with a chicken breast sandwich.  We discussed focusing on small frequent meals to include soft foods with gravies or sauces for support.  Patient has been followed by dietitian.  Follow-up scheduled for February 4th.   He has a prescription for Carafate  tablets, one tablet four times a day, but has not picked up most recent one.  He states he has not noticed much of a difference with use.  Occurs patient to continue using to allow time for outcomes.   Wife shares she is looking for patient to have a follow-up with his dentist due to the loss of On his tooth.  Discussed follow-up with dentist and medical team awareness given active treatment.  They verbalized understanding.  Patient is also scheduling visit with a ear specialist reporting his hearing loss is worsening.  Goals of care We discussed Mr. Poteete's current illness and what it means in the larger context of his on-going co-morbidities. Natural disease trajectory and expectations were discussed.  All  Patient and wife are realistic and their understanding of his cancer diagnosis.  He is clear and expressed wishes to continue to treat the treatable allow him every opportunity to continue to live while managing symptoms as needed.  I empathetically approach discussions regarding goals of care and advance directives.  Patient has completed a MOST form recently.  He also confirms he has a documented advanced directive naming his wife as his dealer.  He plans to bring in a copy at future appointments.  The patient and family outlined their wishes for the following treatment decisions:  Cardiopulmonary Resuscitation: Do Not Attempt Resuscitation (DNR/No CPR)  Medical Interventions: Limited Additional Interventions: Use medical treatment, IV fluids and cardiac monitoring as indicated, DO NOT USE intubation or mechanical ventilation. May consider use of less invasive airway support such as BiPAP or CPAP. Also provide comfort measures. Transfer to the hospital if indicated. Avoid intensive care.   Antibiotics: Antibiotics if indicated  IV Fluids: IV fluids if indicated  Feeding Tube: No feeding tube     I discussed the importance of continued conversation with family and their medical providers regarding overall plan of care and treatment options, ensuring decisions are within the context of the patients values and GOCs. Assessment & Plan Established therapeutic relationship. Education provided on palliative's role in collaboration with their Oncology/Radiation team.  Dysphagia and cancer-related weight loss Dysphagia with difficulty swallowing liquids and solids, leading to significant weight loss of 10 pounds in less than two weeks. No sores or white patches in the mouth or throat. Taste changes  with inability to taste. Appetite is poor, and he is not tolerating protein shakes well. Current weight is 154 pounds, down from 164 pounds on January 13th. Carafate  was previously prescribed but not picked up from the pharmacy. Potassium levels are normal, and he took the last dose this morning. - Will contact dietitian for recommendations on swallowing and appropriate foods to help stabilize weight. - Ensure Carafate  is picked up from the pharmacy to help with swallowing and symptoms.  - Monitor weight and nutritional intake closely. - Provided contact information for follow-up on symptoms such as appetite, nutrition, weight, new  pain, nausea, vomiting, constipation, or diarrhea.  Supportive care during chemotherapy and radiotherapy Undergoing chemotherapy and radiotherapy. No new pain reported outside of throat discomfort. Energy levels are low, and he spends most of the day in his room. No issues with constipation or diarrhea. Sleep is adequate. No new symptoms reported that require immediate intervention. - Monitor for any new symptoms or changes in condition. - Provided oncology card for emergency room visits. - Instructed to inform radiation department about lack of check-in understanding.   Goals of Care  Patient and wife are clear and expressed wishes to continue to treat the treatable allow patient every opportunity to continue to thrive.  They are remaining hopeful for stability. - MOST form on file see above. - Patient states he has a documented advanced directive.  Will bring copy at follow-up visit.  His wife and children are his medical decision makers in the event he is unable to speak for himself.  I will plan to see patient back in 3 to 4 weeks.  Sooner if needed.  Patient expressed understanding and was in agreement with this plan. He also understands that He can call the clinic at any time with any questions, concerns, or complaints.   Thank you for your referral and allowing Palliative to assist in Mr. Eason Housman Landry's care.   Number and complexity of problems addressed: HIGH - 1 or more chronic illnesses with SEVERE exacerbation, progression, or side effects of treatment - advanced cancer, pain. Any controlled substances utilized were prescribed in the context of palliative care.  I personally spent a total of 65 minutes in the care of the patient today including preparing to see the patient, getting/reviewing separately obtained history, performing a medically appropriate exam/evaluation, counseling and educating, referring and communicating with other health care professionals, documenting  clinical information in the EHR, independently interpreting results, and coordinating care. Visit consisted of counseling and education dealing with the complex and emotionally intense issues of symptom management and palliative care in the setting of serious and potentially life-threatening illness.  Signed by: Levon Borer, AGPCNP-BC Palliative Medicine Team/[facility]    "

## 2024-10-01 ENCOUNTER — Other Ambulatory Visit: Payer: Self-pay | Admitting: Family Medicine

## 2024-10-01 ENCOUNTER — Ambulatory Visit

## 2024-10-01 ENCOUNTER — Other Ambulatory Visit (HOSPITAL_COMMUNITY): Payer: Self-pay

## 2024-10-01 ENCOUNTER — Other Ambulatory Visit: Payer: Self-pay

## 2024-10-01 ENCOUNTER — Ambulatory Visit
Admission: RE | Admit: 2024-10-01 | Discharge: 2024-10-01 | Disposition: A | Source: Ambulatory Visit | Attending: Radiation Oncology | Admitting: Radiation Oncology

## 2024-10-01 ENCOUNTER — Inpatient Hospital Stay: Admitting: Nutrition

## 2024-10-01 LAB — RAD ONC ARIA SESSION SUMMARY
Course Elapsed Days: 46
Plan Fractions Treated to Date: 28
Plan Prescribed Dose Per Fraction: 2 Gy
Plan Total Fractions Prescribed: 30
Plan Total Prescribed Dose: 60 Gy
Reference Point Dosage Given to Date: 56 Gy
Reference Point Session Dosage Given: 2 Gy
Session Number: 28

## 2024-10-01 MED FILL — Atorvastatin Calcium Tab 40 MG (Base Equivalent): ORAL | 90 days supply | Qty: 90 | Fill #0 | Status: AC

## 2024-10-02 ENCOUNTER — Ambulatory Visit
Admission: RE | Admit: 2024-10-02 | Discharge: 2024-10-02 | Disposition: A | Source: Ambulatory Visit | Attending: Radiation Oncology | Admitting: Radiation Oncology

## 2024-10-02 ENCOUNTER — Encounter: Payer: Self-pay | Admitting: *Deleted

## 2024-10-02 ENCOUNTER — Ambulatory Visit

## 2024-10-02 ENCOUNTER — Telehealth: Payer: Self-pay

## 2024-10-02 ENCOUNTER — Other Ambulatory Visit: Payer: Self-pay | Admitting: Radiation Oncology

## 2024-10-02 ENCOUNTER — Other Ambulatory Visit: Payer: Self-pay

## 2024-10-02 ENCOUNTER — Other Ambulatory Visit (HOSPITAL_COMMUNITY): Payer: Self-pay

## 2024-10-02 DIAGNOSIS — C7A8 Other malignant neuroendocrine tumors: Secondary | ICD-10-CM

## 2024-10-02 LAB — RAD ONC ARIA SESSION SUMMARY
Course Elapsed Days: 47
Plan Fractions Treated to Date: 29
Plan Prescribed Dose Per Fraction: 2 Gy
Plan Total Fractions Prescribed: 30
Plan Total Prescribed Dose: 60 Gy
Reference Point Dosage Given to Date: 58 Gy
Reference Point Session Dosage Given: 2 Gy
Session Number: 29

## 2024-10-02 MED ORDER — LIDOCAINE VISCOUS HCL 2 % MT SOLN
15.0000 mL | OROMUCOSAL | 1 refills | Status: AC | PRN
  Filled 2024-10-02: qty 250, 15d supply, fill #0
  Filled 2024-10-12: qty 250, 15d supply, fill #1

## 2024-10-02 NOTE — Telephone Encounter (Signed)
 Call placed to patient and spouse to make aware of appointment for patient to receive IVF on 10/03/24 @ 830am at the cancer center.  Left voicemail.

## 2024-10-03 ENCOUNTER — Ambulatory Visit

## 2024-10-03 ENCOUNTER — Ambulatory Visit: Attending: Cardiology | Admitting: Internal Medicine

## 2024-10-03 ENCOUNTER — Ambulatory Visit
Admission: RE | Admit: 2024-10-03 | Discharge: 2024-10-03 | Disposition: A | Discharge status: Home / Self Care | Source: Ambulatory Visit | Attending: Radiation Oncology | Admitting: Radiation Oncology

## 2024-10-03 ENCOUNTER — Encounter: Payer: Self-pay | Admitting: Physician Assistant

## 2024-10-03 ENCOUNTER — Ambulatory Visit: Admitting: Cardiology

## 2024-10-03 ENCOUNTER — Encounter: Payer: Self-pay | Admitting: Internal Medicine

## 2024-10-03 ENCOUNTER — Telehealth: Payer: Self-pay

## 2024-10-03 ENCOUNTER — Inpatient Hospital Stay

## 2024-10-03 ENCOUNTER — Other Ambulatory Visit: Payer: Self-pay

## 2024-10-03 VITALS — BP 95/57 | HR 86 | Ht 69.0 in | Wt 145.0 lb

## 2024-10-03 VITALS — BP 158/86 | HR 106 | Temp 97.9°F | Resp 20

## 2024-10-03 DIAGNOSIS — I959 Hypotension, unspecified: Secondary | ICD-10-CM | POA: Diagnosis not present

## 2024-10-03 DIAGNOSIS — C7A8 Other malignant neuroendocrine tumors: Secondary | ICD-10-CM

## 2024-10-03 DIAGNOSIS — R110 Nausea: Secondary | ICD-10-CM | POA: Insufficient documentation

## 2024-10-03 DIAGNOSIS — I493 Ventricular premature depolarization: Secondary | ICD-10-CM

## 2024-10-03 DIAGNOSIS — Z79899 Other long term (current) drug therapy: Secondary | ICD-10-CM | POA: Insufficient documentation

## 2024-10-03 DIAGNOSIS — D3A8 Other benign neuroendocrine tumors: Secondary | ICD-10-CM | POA: Diagnosis not present

## 2024-10-03 DIAGNOSIS — C3431 Malignant neoplasm of lower lobe, right bronchus or lung: Secondary | ICD-10-CM | POA: Insufficient documentation

## 2024-10-03 DIAGNOSIS — I3139 Other pericardial effusion (noninflammatory): Secondary | ICD-10-CM | POA: Diagnosis not present

## 2024-10-03 DIAGNOSIS — R112 Nausea with vomiting, unspecified: Secondary | ICD-10-CM | POA: Insufficient documentation

## 2024-10-03 DIAGNOSIS — R0600 Dyspnea, unspecified: Secondary | ICD-10-CM

## 2024-10-03 LAB — CMP (CANCER CENTER ONLY)
ALT: 8 U/L (ref 0–44)
AST: 14 U/L — ABNORMAL LOW (ref 15–41)
Albumin: 3.4 g/dL — ABNORMAL LOW (ref 3.5–5.0)
Alkaline Phosphatase: 64 U/L (ref 38–126)
Anion gap: 12 (ref 5–15)
BUN: 16 mg/dL (ref 8–23)
CO2: 24 mmol/L (ref 22–32)
Calcium: 8.2 mg/dL — ABNORMAL LOW (ref 8.9–10.3)
Chloride: 101 mmol/L (ref 98–111)
Creatinine: 1.13 mg/dL (ref 0.61–1.24)
GFR, Estimated: >60 mL/min
Glucose, Bld: 117 mg/dL — ABNORMAL HIGH (ref 70–99)
Potassium: 3.7 mmol/L (ref 3.5–5.1)
Sodium: 136 mmol/L (ref 135–145)
Total Bilirubin: 0.5 mg/dL (ref 0.0–1.2)
Total Protein: 6 g/dL — ABNORMAL LOW (ref 6.5–8.1)

## 2024-10-03 LAB — CBC WITH DIFFERENTIAL (CANCER CENTER ONLY)
Abs Immature Granulocytes: 0 K/uL (ref 0.00–0.07)
Basophils Absolute: 0 K/uL (ref 0.0–0.1)
Basophils Relative: 0 %
Eosinophils Absolute: 0 K/uL (ref 0.0–0.5)
Eosinophils Relative: 0 %
HCT: 26.5 % — ABNORMAL LOW (ref 39.0–52.0)
Hemoglobin: 8.7 g/dL — ABNORMAL LOW (ref 13.0–17.0)
Immature Granulocytes: 0 %
Lymphocytes Relative: 11 %
Lymphs Abs: 0.1 K/uL — ABNORMAL LOW (ref 0.7–4.0)
MCH: 28.2 pg (ref 26.0–34.0)
MCHC: 32.8 g/dL (ref 30.0–36.0)
MCV: 85.8 fL (ref 80.0–100.0)
Monocytes Absolute: 0.1 K/uL (ref 0.1–1.0)
Monocytes Relative: 10 %
Neutro Abs: 0.5 K/uL — ABNORMAL LOW (ref 1.7–7.7)
Neutrophils Relative %: 79 %
Platelet Count: 73 K/uL — ABNORMAL LOW (ref 150–400)
RBC: 3.09 MIL/uL — ABNORMAL LOW (ref 4.22–5.81)
RDW: 14.6 % (ref 11.5–15.5)
Smear Review: NORMAL
WBC Count: 0.6 K/uL — CL (ref 4.0–10.5)
nRBC: 0 % (ref 0.0–0.2)

## 2024-10-03 LAB — RAD ONC ARIA SESSION SUMMARY
Course Elapsed Days: 48
Plan Fractions Treated to Date: 30
Plan Prescribed Dose Per Fraction: 2 Gy
Plan Total Fractions Prescribed: 30
Plan Total Prescribed Dose: 60 Gy
Reference Point Dosage Given to Date: 60 Gy
Reference Point Session Dosage Given: 2 Gy
Session Number: 30

## 2024-10-03 MED ORDER — SODIUM CHLORIDE 0.9 % IV SOLN: INTRAVENOUS | Status: AC

## 2024-10-03 MED ORDER — ONDANSETRON HCL 4 MG/2ML IJ SOLN
INTRAMUSCULAR | Status: AC
  Filled 2024-10-03: qty 2

## 2024-10-03 MED ORDER — ONDANSETRON HCL 4 MG/2ML IJ SOLN
4.0000 mg | Freq: Once | INTRAMUSCULAR | Status: AC
  Administered 2024-10-03: 4 mg via INTRAVENOUS

## 2024-10-03 NOTE — Telephone Encounter (Signed)
 Received call from patients wife. Wife states patient is currently being seen by cardiology. Cardiologist wanted to place heart monitoring device on patient. Asked if potential device damage form could be signed and sent back to radiation oncology so that patient could continue with treatment. Per MD heart monitor will be placed after completion of radiation treatment.  Patient is currently on 29 of 33 treatments to the right lung. Patient is scheduled to finish treatments on 10/08/24. Cardiology office made aware of patients last treatment.

## 2024-10-03 NOTE — Telephone Encounter (Signed)
"   Received critical WBC result of 0.6 by Hadassah Rattler on 10/03/2024 16:07:07, and has been read back. Dr. Shannon was notified. "

## 2024-10-03 NOTE — Progress Notes (Signed)
 At 1545 Pt transported to radonc for tx via w/c by this RN and Kaitlyn NT. IVFs infusing, radonc aware.

## 2024-10-03 NOTE — Progress Notes (Signed)
 " Cardiology Office Note:  .   Date:  10/03/2024  ID:  Edward [name], DOB December 04, 1940, MRN [id number] PCP: [name], NP-C  [facility] Providers Cardiologist:  None    History of Present Illness: .     Discussed the use of AI scribe software for clinical note transcription with the patient, who gave verbal consent to proceed.  History of Present Illness Edward [name] is an 84 year old male with a history of heart failure who presents with concerns about heart function and symptoms of shortness of breath and dizziness. He is accompanied by [name], his husband. He was referred by other doctors for evaluation of his heart due to concerns about skipped beats and shortness of breath.  Dyspnea and exercise intolerance - Shortness of breath on exertion, including walking into the clinic - Swelling in the legs, previously evaluated for deep vein thrombosis with negative results - Acute hypoxic respiratory failure during recent hospitalization, treated with one dose of Lasix  - No chest pain, shortness of breath, or palpitations currently  Cardiac dysfunction and arrhythmia symptoms - History of heart failure with reduced ejection fraction (40-45% on echocardiogram August 17, 2024) - Mild left ventricular hypertrophy, grade one diastolic dysfunction, moderately dilated left atrium, and pericardial effusion anterior to the right ventricle - Concerns about skipped heart beats - Blood pressure running low recently, today measured at 95/57 - No syncope, but intermittent dizziness and feeling dazed, especially this morning - Not currently taking a diuretic such as Lasix ; withheld losartan  today  Oncologic history and treatment effects - 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 - Undergoing systemic chemotherapy with carboplatin  and etoposide , and radiotherapy - Follows with palliative care for goals of care management - Symptoms of  feeling cold, inability to eat, and loss of taste, beginning midway through chemotherapy and radiation - Weight loss from previous weight of 179 pounds  Recent hospitalization and complications - Hospitalized September 06, 2024 to September 16, 2024 for fatigue, mid-sternal chest pain, indigestion, abdominal pain, vomiting, diarrhea, and chills - Symptoms began several weeks prior to first chemotherapy dose on August 27, 2024 - Diagnosed with pancytopenia, right infrahilar mass, unchanged right pleural effusion, and right middle lobe consolidation - Treated with IV antibiotics for C. difficile colitis and underlying neutropenia - Epigastric pain attributed to possible esophageal dysphagia, likely radiation-induced  Gastrointestinal symptoms - Inability to eat and lack of taste, ongoing since midway through chemotherapy and radiation - History of vomiting, diarrhea, and abdominal pain during recent hospitalization - Epigastric pain possibly related to esophageal dysphagia secondary to radiation  Metabolic and hemodynamic status - Recent blood pressure readings low, today 95/57 - Recent blood sugar reading of 166 - No episodes of syncope, but intermittent dizziness and feeling dazed     ROS: Remaining review of systems negative  Studies Reviewed: .        Results Labs Troponin: Mildly elevated  Diagnostic Echocardiogram (08/17/2024): Ejection fraction 40-45%, mild left ventricular hypertrophy, grade 1 diastolic dysfunction, moderately dilated left atrium, pericardial effusion anterior to right ventricle EKG: Premature ventricular contractions Risk Assessment/Calculations:             Physical Exam:   VS:  BP (!) 95/57   Pulse 86   Ht 5' 9 (1.753 m)   Wt 145 lb (65.8 kg)   SpO2 95%   BMI 21.41 kg/m    Wt Readings from Last 3 Encounters:  10/03/24 145 lb (65.8 kg)  09/30/24  154 lb (69.9 kg)  09/25/24 164 lb (74.4 kg)    GEN: Well nourished, well developed in no acute  distress NECK: Not well assessed CARDIAC: RRR, no murmurs, no rubs, no gallops RESPIRATORY:  Clear to auscultation without rales, wheezing or rhonchi  ABDOMEN: Soft, non-tender, non-distended EXTREMITIES: 2+ lower extremity pitting edema; No deformity   ASSESSMENT AND PLAN: .    Assessment and Plan Assessment & Plan Small pericardial effusion Identified on most recent echocardiogram.  Hypotensive today with lower extremity edema and symptoms of dizziness concerning for progression of pericardial effusion. - Ordered urgent limited echocardiogram to assess pericardial effusion. - Consider pericardiocentesis if tamponadetamponade  Acute on chronic heart failure with mildly reduced ejection fraction EF 40-45% and mild left ventricular hypertrophy.  Home GDMT: Losartan  50 mg - Hold losartan  due to hypotension - Hold metoprolol  tartrate for hypotension  - Will not diurese for now until echocardiogram is obtained to avoid volume depletion in the setting of pericardial effusion with unknown status -He is supposed to get IV fluids in the coming days related to his chemotherapy.  Recommend very cautious IV hydration.  Patient was advised to go to the ED if he becomes short of breath or with significant weight gain and worsening lower extremity edema - Advised to seek medical attention if symptoms worsen.  Premature ventricular contractions Presence of PVCs noted on EKG.  Currently on metoprolol  tartrate but possibly an acute systolic heart failure with an effusion and currently hypotension - Was planning on placing a 1 week Zio patch however will hold off for now as patient is undergoing radiation therapy over the next several days.  Would consider placing patch after radiation therapy or at least when there is a 1 week break to monitor PVC burden  Hypotension Blood pressure at 95/57 mmHg, possibly related to weight loss and chemotherapy and/or pericardial effusion progression. - Hold losartan   and metoprolol  until blood pressure improves. - Monitor blood pressure and symptoms closely.            Follow up: 2 to 4 weeks  Signed, Emeline FORBES Calender, DO  10/03/2024 11:35 AM    [facility] "

## 2024-10-03 NOTE — Progress Notes (Signed)
 Patient down in rad onc waiting  room and began to complain of chest pain. Patient states pain is in mid chest.  Denies pain radiating down arms. Patient also began to vomit. Patient reports relief of chest pain after vomiting. Patient given Zofran  4mg  IV. IVF's stopped. Stat CBC diff and CMP obtained. Patient was able to complete radiation treatment.   BP (!) 158/86   Pulse (!) 106   Temp 97.9 F (36.6 C)   Resp 20   SpO2 96%

## 2024-10-03 NOTE — Telephone Encounter (Signed)
 Per Dr Sherrod canceled pts port placement for tomorrow.

## 2024-10-03 NOTE — Patient Instructions (Addendum)
 Medication Instructions:    HOLD LOSARTAN  AND METOPROLOL  :   FOR NOW UNTIL TOLD OTHER WISE   *If you need a refill on your cardiac medications before your next appointment, please call your pharmacy*    Lab Work: NONE ORDERED  TODAY    If you have labs (blood work) drawn today and your tests are completely normal, you will receive your results only by: MyChart Message (if you have MyChart) OR A paper copy in the mail If you have any lab test that is abnormal or we need to change your treatment, we will call you to review the results.   Testing/Procedures:  Your physician has requested that you have an echocardiogram. Echocardiography is a painless test that uses sound waves to create images of your heart. It provides your doctor with information about the size and shape of your heart and how well your hearts chambers and valves are working. This procedure takes approximately one hour. There are no restrictions for this procedure. Please do NOT wear cologne, perfume, aftershave, or lotions (deodorant is allowed). Please arrive 15 minutes prior to your appointment time.  Please note: We ask at that you not bring children with you during ultrasound (echo/ vascular) testing. Due to room size and safety concerns, children are not allowed in the ultrasound rooms during exams. Our front office staff cannot provide observation of children in our lobby area while testing is being conducted. An adult accompanying a patient to their appointment will only be allowed in the ultrasound room at the discretion of the ultrasound technician under special circumstances. We apologize for any inconvenience.    Follow-Up: At [facility], you and your health needs are our priority.  As part of our continuing mission to provide you with exceptional heart care, our providers are all part of one team.  This team includes your primary Cardiologist (physician) and Advanced Practice Providers or APPs  (Physician Assistants and Nurse Practitioners) who all work together to provide you with the care you need, when you need it.  Your next appointment:  2 -4 week(s)  Provider:  DR KRISTE       We recommend signing up for the patient portal called MyChart.  Sign up information is provided on this After Visit Summary.  MyChart is used to connect with patients for Virtual Visits (Telemedicine).  Patients are able to view lab/test results, encounter notes, upcoming appointments, etc.  Non-urgent messages can be sent to your provider as well.   To learn more about what you can do with MyChart, go to [email or web address].    Other Instructions

## 2024-10-03 NOTE — Patient Instructions (Addendum)
 MyChart Information:  Username: [email or web address] Password: [id number]  Username: [email or web address] Password: [email or web address]   Rehydration, Older Adult  Rehydration is the replacement of fluids, salts, and minerals in the body (electrolytes) that are lost during dehydration. Dehydration is when there is not enough water or other fluids in the body. This happens when you lose more fluids than you take in. People who are age 83 or older have a higher risk of dehydration than younger adults. This is because in older age, the body: Is less able to maintain the right amount of water. Does not respond to temperature changes as well. Does not get a sense of thirst as easily or quickly. Other causes include: Not drinking enough fluids. This can occur when you are ill, when you forget to drink, or when you are doing activities that require a lot of energy, especially in hot weather. Conditions that cause loss of water or other fluids. These include diarrhea, vomiting, sweating, or urinating a lot. Other illnesses, such as fever or infection. Certain medicines, such as those that remove excess fluid from the body (diuretics). Symptoms of mild or moderate dehydration may include thirst, dry lips and mouth, and dizziness. Symptoms of severe dehydration may include increased heart rate, confusion, fainting, and not urinating. In severe cases, you may need to get fluids through an IV at the hospital. For mild or moderate cases, you can usually rehydrate at home by drinking certain fluids as told by your health care provider. What are the risks? Rehydration is usually safe. Taking in too much fluid (overhydration) can be a problem but is rare. Overhydration can cause an imbalance of electrolytes in the body, kidney failure, fluid in the lungs, or a decrease in salt (sodium) levels in the body. Supplies needed: You will need an oral rehydration solution (ORS) if your health care provider tells you to use one. This is a drink to treat  dehydration. It can be found in pharmacies and retail stores. How to rehydrate Fluids Follow instructions from your health care provider about what to drink. The kind of fluid and the amount you should drink depend on your condition. In general, you should choose drinks that you prefer. If told by your health care provider, drink an ORS. Make an ORS by following instructions on the package. Start by drinking small amounts, about  cup (120 mL) every 5-10 minutes. Slowly increase how much you drink until you have taken in the amount recommended by your health care provider. Drink enough clear fluids to keep your urine pale yellow. If you were told to drink an ORS, finish it first, then start slowly drinking other clear fluids. Drink fluids such as: Water. This includes sparkling and flavored water. Drinking only water can lead to having too little sodium in your body (hyponatremia). Follow the advice of your health care provider. Water from ice chips you suck on. Fruit juice with water added to it(diluted). Sports drinks. Hot or cold herbal teas. Broth-based soups. Coffee. Milk or milk products. Food Follow instructions from your health care provider about what to eat while you rehydrate. Your health care provider may recommend that you slowly begin eating regular foods in small amounts. Eat foods that contain a healthy balance of electrolytes, such as bananas, oranges, potatoes, tomatoes, and spinach. Avoid foods that are greasy or contain a lot of sugar. In some cases, you may get nutrition through a feeding tube that is passed through your nose and into your stomach (nasogastric tube, or NG tube).  This may be done if you have uncontrolled vomiting or diarrhea. Drinks to avoid  Certain drinks may make dehydration worse. While you rehydrate, avoid drinking alcohol . How to tell if you are recovering from dehydration You may be getting better if: You are urinating more often than before you  started rehydrating. Your urine is pale yellow. Your energy level improves. You vomit less often. You have diarrhea less often. Your appetite improves or returns to normal. You feel less dizzy or light-headed. Your skin tone and color start to look more normal. Follow these instructions at home: Take over-the-counter and prescription medicines only as told by your health care provider. Do not take sodium tablets. Doing this can lead to having too much sodium in your body (hypernatremia). Contact a health care provider if: You continue to have symptoms of mild or moderate dehydration, such as: Thirst. Dry lips. Slightly dry mouth. Dizziness. Dark urine or less urine than usual. Muscle cramps. You continue to vomit or have diarrhea. Get help right away if: You have symptoms of dehydration that get worse. You have a fever. You have a severe headache. You have been vomiting and have problems, such as: Your vomiting gets worse. Your vomit includes blood or green matter (bile). You cannot eat or drink without vomiting. You have problems with urination or bowel movements, such as: Diarrhea that gets worse. Blood in your stool (feces). This may cause stool to look black and tarry. Not urinating, or urinating only a small amount of very dark urine, within 6-8 hours. You have trouble breathing. You have symptoms that get worse with treatment. These symptoms may be an emergency. Get help right away. Call 911. Do not wait to see if the symptoms will go away. Do not drive yourself to the hospital. This information is not intended to replace advice given to you by your health care provider. Make sure you discuss any questions you have with your health care provider. Document Revised: 01/12/2022 Document Reviewed: 01/10/2022 Elsevier Patient Education  2024 [employer].

## 2024-10-04 ENCOUNTER — Ambulatory Visit

## 2024-10-04 ENCOUNTER — Other Ambulatory Visit: Payer: Self-pay

## 2024-10-04 ENCOUNTER — Telehealth: Payer: Self-pay | Admitting: Internal Medicine

## 2024-10-04 ENCOUNTER — Ambulatory Visit
Admission: RE | Admit: 2024-10-04 | Discharge: 2024-10-04 | Disposition: A | Source: Ambulatory Visit | Attending: Radiation Oncology | Admitting: Radiation Oncology

## 2024-10-04 ENCOUNTER — Ambulatory Visit (HOSPITAL_COMMUNITY)

## 2024-10-04 ENCOUNTER — Other Ambulatory Visit (HOSPITAL_COMMUNITY)

## 2024-10-04 ENCOUNTER — Encounter: Payer: Self-pay | Admitting: Physician Assistant

## 2024-10-04 ENCOUNTER — Inpatient Hospital Stay

## 2024-10-04 ENCOUNTER — Encounter: Payer: Self-pay | Admitting: Internal Medicine

## 2024-10-04 ENCOUNTER — Other Ambulatory Visit (HOSPITAL_COMMUNITY): Payer: Self-pay

## 2024-10-04 VITALS — BP 141/95 | HR 45 | Resp 17

## 2024-10-04 DIAGNOSIS — D701 Agranulocytosis secondary to cancer chemotherapy: Secondary | ICD-10-CM

## 2024-10-04 LAB — RAD ONC ARIA SESSION SUMMARY
Course Elapsed Days: 49
Plan Fractions Treated to Date: 1
Plan Prescribed Dose Per Fraction: 2 Gy
Plan Total Fractions Prescribed: 3
Plan Total Prescribed Dose: 6 Gy
Reference Point Dosage Given to Date: 2 Gy
Reference Point Session Dosage Given: 2 Gy
Session Number: 31

## 2024-10-04 MED ORDER — FILGRASTIM-SNDZ 300 MCG/0.5ML IJ SOSY
300.0000 ug | PREFILLED_SYRINGE | Freq: Once | INTRAMUSCULAR | Status: AC
  Administered 2024-10-04: 300 ug via SUBCUTANEOUS
  Filled 2024-10-04: qty 0.5

## 2024-10-04 NOTE — Telephone Encounter (Signed)
 Called  pt wife answered and we sched appt for injection for today at 2:15 and tomorrow at sat at 11:30

## 2024-10-05 ENCOUNTER — Inpatient Hospital Stay

## 2024-10-05 ENCOUNTER — Other Ambulatory Visit (HOSPITAL_COMMUNITY): Payer: Self-pay

## 2024-10-05 DIAGNOSIS — T451X5A Adverse effect of antineoplastic and immunosuppressive drugs, initial encounter: Secondary | ICD-10-CM

## 2024-10-05 MED ORDER — FILGRASTIM-SNDZ 300 MCG/0.5ML IJ SOSY
300.0000 ug | PREFILLED_SYRINGE | Freq: Once | INTRAMUSCULAR | Status: AC
  Administered 2024-10-05: 300 ug via SUBCUTANEOUS
  Filled 2024-10-05: qty 0.5

## 2024-10-07 ENCOUNTER — Inpatient Hospital Stay

## 2024-10-07 ENCOUNTER — Ambulatory Visit

## 2024-10-08 ENCOUNTER — Encounter: Payer: Self-pay | Admitting: Physician Assistant

## 2024-10-08 ENCOUNTER — Ambulatory Visit
Admission: RE | Admit: 2024-10-08 | Discharge: 2024-10-08 | Disposition: A | Source: Ambulatory Visit | Attending: Radiation Oncology | Admitting: Radiation Oncology

## 2024-10-08 ENCOUNTER — Encounter: Payer: Self-pay | Admitting: Internal Medicine

## 2024-10-08 ENCOUNTER — Other Ambulatory Visit: Payer: Self-pay

## 2024-10-08 ENCOUNTER — Ambulatory Visit
Admission: RE | Admit: 2024-10-08 | Discharge: 2024-10-08 | Attending: Radiation Oncology | Admitting: Radiation Oncology

## 2024-10-08 ENCOUNTER — Ambulatory Visit

## 2024-10-08 DIAGNOSIS — R911 Solitary pulmonary nodule: Secondary | ICD-10-CM

## 2024-10-08 LAB — RAD ONC ARIA SESSION SUMMARY
Course Elapsed Days: 53
Plan Fractions Treated to Date: 2
Plan Prescribed Dose Per Fraction: 2 Gy
Plan Total Fractions Prescribed: 3
Plan Total Prescribed Dose: 6 Gy
Reference Point Dosage Given to Date: 4 Gy
Reference Point Session Dosage Given: 2 Gy
Session Number: 32

## 2024-10-08 LAB — CBC WITH DIFFERENTIAL (CANCER CENTER ONLY)
Abs Immature Granulocytes: 0.05 10*3/uL (ref 0.00–0.07)
Basophils Absolute: 0 10*3/uL (ref 0.0–0.1)
Basophils Relative: 0 %
Eosinophils Absolute: 0 10*3/uL (ref 0.0–0.5)
Eosinophils Relative: 0 %
HCT: 27.7 % — ABNORMAL LOW (ref 39.0–52.0)
Hemoglobin: 9.2 g/dL — ABNORMAL LOW (ref 13.0–17.0)
Immature Granulocytes: 2 %
Lymphocytes Relative: 7 %
Lymphs Abs: 0.2 10*3/uL — ABNORMAL LOW (ref 0.7–4.0)
MCH: 28.4 pg (ref 26.0–34.0)
MCHC: 33.2 g/dL (ref 30.0–36.0)
MCV: 85.5 fL (ref 80.0–100.0)
Monocytes Absolute: 0.3 10*3/uL (ref 0.1–1.0)
Monocytes Relative: 9 %
Neutro Abs: 2.8 10*3/uL (ref 1.7–7.7)
Neutrophils Relative %: 82 %
Platelet Count: 77 10*3/uL — ABNORMAL LOW (ref 150–400)
RBC: 3.24 MIL/uL — ABNORMAL LOW (ref 4.22–5.81)
RDW: 15.7 % — ABNORMAL HIGH (ref 11.5–15.5)
WBC Count: 3.4 10*3/uL — ABNORMAL LOW (ref 4.0–10.5)
nRBC: 0 % (ref 0.0–0.2)

## 2024-10-09 ENCOUNTER — Ambulatory Visit
Admission: RE | Admit: 2024-10-09 | Discharge: 2024-10-09 | Disposition: A | Source: Ambulatory Visit | Attending: Radiation Oncology | Admitting: Radiation Oncology

## 2024-10-09 ENCOUNTER — Other Ambulatory Visit: Payer: Self-pay

## 2024-10-09 ENCOUNTER — Inpatient Hospital Stay: Admitting: Internal Medicine

## 2024-10-09 ENCOUNTER — Ambulatory Visit
Admission: RE | Admit: 2024-10-09 | Discharge: 2024-10-09 | Attending: Radiation Oncology | Admitting: Radiation Oncology

## 2024-10-09 ENCOUNTER — Other Ambulatory Visit: Payer: Self-pay | Admitting: Radiation Oncology

## 2024-10-09 ENCOUNTER — Inpatient Hospital Stay

## 2024-10-09 DIAGNOSIS — C7A8 Other malignant neuroendocrine tumors: Secondary | ICD-10-CM

## 2024-10-09 DIAGNOSIS — R197 Diarrhea, unspecified: Secondary | ICD-10-CM

## 2024-10-09 LAB — RAD ONC ARIA SESSION SUMMARY
Course Elapsed Days: 54
Plan Fractions Treated to Date: 3
Plan Prescribed Dose Per Fraction: 2 Gy
Plan Total Fractions Prescribed: 3
Plan Total Prescribed Dose: 6 Gy
Reference Point Dosage Given to Date: 6 Gy
Reference Point Session Dosage Given: 2 Gy
Session Number: 33

## 2024-10-09 LAB — C DIFFICILE QUICK SCREEN W PCR REFLEX
C Diff antigen: POSITIVE — AB
C Diff interpretation: DETECTED
C Diff toxin: POSITIVE — AB

## 2024-10-10 ENCOUNTER — Other Ambulatory Visit: Payer: Self-pay

## 2024-10-10 ENCOUNTER — Inpatient Hospital Stay

## 2024-10-10 ENCOUNTER — Telehealth: Payer: Self-pay

## 2024-10-10 ENCOUNTER — Other Ambulatory Visit (HOSPITAL_COMMUNITY): Payer: Self-pay

## 2024-10-10 DIAGNOSIS — A0472 Enterocolitis due to Clostridium difficile, not specified as recurrent: Secondary | ICD-10-CM

## 2024-10-10 LAB — GASTROINTESTINAL PANEL BY PCR, STOOL (REPLACES STOOL CULTURE)

## 2024-10-10 NOTE — Telephone Encounter (Signed)
 Called to make  patient and  spouse aware of tool sample results. Patient to follow up at infectious disease 10/11/24 @ 10 am. Wife voiced understanding.

## 2024-10-10 NOTE — Radiation Completion Notes (Signed)
 Patient Name: Edward [name], Edward [name] MRN: [id number] Date of Birth: 1940/12/10 Referring Physician: SHERROD SHERROD, M.D. Date of Service: 2024-10-10 Radiation Oncologist: Signe Nasuti, M.D. 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, M=cM0 Intent: Curative     ==========DELIVERED PLANS==========  First Treatment Date: 2024-08-16 Last Treatment Date: 2024-10-09   Plan Name: Lung_R Site: Lung, Right Technique: 3D Mode: Photon Dose Per Fraction: 2 Gy Prescribed Dose (Delivered / Prescribed): 60 Gy / 60 Gy Prescribed Fxs (Delivered / Prescribed): 30 / 30   Plan Name: Lung_R_Bst Site: Lung, Right Technique: 3D Mode: Photon Dose Per Fraction: 2 Gy Prescribed Dose (Delivered / Prescribed): 6 Gy / 6 Gy Prescribed Fxs (Delivered / Prescribed): 3 / 3     ==========ON TREATMENT VISIT DATES========== 2024-08-20, 2024-08-27, 2024-09-03, 2024-09-16, 2024-09-24, 2024-10-02, 2024-10-08     ==========UPCOMING VISITS========== 11/26/2024 [facility] OFFICE VISIT - LINZIE Catherine Cools, MD  11/20/2024 [facility] [place] OFFICE VISIT [name], NP-C  11/13/2024 [facility] FOLLOW UP Roark Rush, MD  11/11/2024 [facility] PORT FLUSH W/LAB [facility] FLUSH  11/08/2024 [facility]G [facility] ECHO ECHOCARDIOGRAM [facility] 1  11/08/2024 [facility] INFUSION 2HR30MIN (150) [facility] INFUSION  11/07/2024 [facility] INFUSION 2HR30MIN (150) [facility] INFUSION  11/06/2024 [facility] INFUSION 3HR (180) [facility] INFUSION  11/06/2024 [facility] EST PT 30 Heilingoetter, Cassandra L, PA-C  11/05/2024 [facility] LAB [facility] LAB  11/05/2024 [facility]IATION ONC FOLLOW UP  15 Wyatt Leeroy HERO, [place]  11/04/2024 [facility] LAB [facility] LAB  10/28/2024 [facility] LAB [facility] LAB  10/21/2024 [facility] LAB [facility] LAB  10/18/2024 [facility] OFFICE VISIT Kriste Emeline BRAVO, DO  10/18/2024 [facility] INFUSION 2HR30MIN (150) [facility] INFUSION  10/17/2024 [facility] INFUSION 2HR30MIN (150) [facility] INFUSION  10/16/2024 [facility] LAB [facility] LAB  10/16/2024 [facility] PALLIATIVE CARE    [facility] PALLIATIVE CARE  10/16/2024 [facility]COLOG[facility] NUT 45 Ivonne Harlene RAMAN, RD  10/16/2024 [facility] INFUSION 3HR (180) [facility] INFUSION  10/16/2024 [facility] EST PT 15 Sherrod Sherrod, MD  10/15/2024 [facility]CARDIOTHORACIC SURGERY AT [facility] ST NEW THORACIC Kerrin Elspeth BROCKS, MD  10/11/2024 [facility]R FOR INF DIS NEW PATIENT 30 Vu, Constance DASEN, MD        ==========APPENDIX - ON TREATMENT VISIT NOTES==========   See weekly On Treatment Notes in Epic for details in the Media tab (listed as Progress notes on the On Treatment Visit Dates listed above).

## 2024-10-11 ENCOUNTER — Inpatient Hospital Stay

## 2024-10-11 ENCOUNTER — Telehealth: Payer: Self-pay | Admitting: Internal Medicine

## 2024-10-11 ENCOUNTER — Other Ambulatory Visit: Payer: Self-pay

## 2024-10-11 ENCOUNTER — Encounter: Payer: Self-pay | Admitting: Internal Medicine

## 2024-10-11 ENCOUNTER — Ambulatory Visit: Payer: Self-pay | Admitting: Internal Medicine

## 2024-10-11 VITALS — BP 81/48 | HR 71 | Temp 98.2°F

## 2024-10-11 DIAGNOSIS — A0472 Enterocolitis due to Clostridium difficile, not specified as recurrent: Secondary | ICD-10-CM

## 2024-10-11 DIAGNOSIS — R1310 Dysphagia, unspecified: Secondary | ICD-10-CM

## 2024-10-11 LAB — COMPLETE METABOLIC PANEL WITHOUT GFR
AG Ratio: 1.3 (calc) (ref 1.0–2.5)
ALT: 8 U/L — ABNORMAL LOW (ref 9–46)
AST: 11 U/L (ref 10–35)
Albumin: 2.9 g/dL — ABNORMAL LOW (ref 3.6–5.1)
Alkaline phosphatase (APISO): 54 U/L (ref 35–144)
BUN/Creatinine Ratio: 21 (calc) (ref 6–22)
BUN: 26 mg/dL — ABNORMAL HIGH (ref 7–25)
CO2: 26 mmol/L (ref 20–32)
Calcium: 8.3 mg/dL — ABNORMAL LOW (ref 8.6–10.3)
Chloride: 101 mmol/L (ref 98–110)
Creat: 1.25 mg/dL — ABNORMAL HIGH (ref 0.70–1.22)
Globulin: 2.2 g/dL (ref 1.9–3.7)
Glucose, Bld: 160 mg/dL — ABNORMAL HIGH (ref 65–99)
Potassium: 2.8 mmol/L — ABNORMAL LOW (ref 3.5–5.3)
Sodium: 139 mmol/L (ref 135–146)
Total Bilirubin: 0.6 mg/dL (ref 0.2–1.2)
Total Protein: 5.1 g/dL — ABNORMAL LOW (ref 6.1–8.1)

## 2024-10-11 LAB — CBC WITH DIFFERENTIAL/PLATELET
Absolute Lymphocytes: 751 {cells}/uL — ABNORMAL LOW (ref 850–3900)
Absolute Monocytes: 189 {cells}/uL — ABNORMAL LOW (ref 200–950)
Basophils Absolute: 11 {cells}/uL (ref 0–200)
Basophils Relative: 0.2 %
Eosinophils Absolute: 0 {cells}/uL — ABNORMAL LOW (ref 15–500)
Eosinophils Relative: 0 %
HCT: 28.3 % — ABNORMAL LOW (ref 39.4–51.1)
Hemoglobin: 9 g/dL — ABNORMAL LOW (ref 13.2–17.1)
MCH: 28 pg (ref 27.0–33.0)
MCHC: 31.8 g/dL (ref 31.6–35.4)
MCV: 88.2 fL (ref 81.4–101.7)
MPV: 13.3 fL — ABNORMAL HIGH (ref 7.5–12.5)
Monocytes Relative: 3.5 %
Neutro Abs: 4450 {cells}/uL (ref 1500–7800)
Neutrophils Relative %: 82.4 %
Platelets: 100 10*3/uL — ABNORMAL LOW (ref 140–400)
RBC: 3.21 Million/uL — ABNORMAL LOW (ref 4.20–5.80)
RDW: 15.6 % — ABNORMAL HIGH (ref 11.0–15.0)
Total Lymphocyte: 13.9 %
WBC: 5.4 10*3/uL (ref 3.8–10.8)

## 2024-10-11 MED ORDER — FLUCONAZOLE 40 MG/ML PO SUSR: 200.0000 mg | Freq: Every day | ORAL | 0 refills | Status: AC

## 2024-10-11 NOTE — Patient Instructions (Signed)
 I believe your cdiff was treated. Your frequent stool will take time to improve   Continue small low carbohydrate meals throughout the day, avoid high fat/high sugar content and high dairy content   For immodium use: Take it at minimal every 8 hours; at most every 6 hours If in an 8 hour period you have less than 2 stools a day then do every 8 hours If in a 6-8 hour period you have more than 2 stools a day, then use every 6 hours   Labs today   I have referred you to GI doctor to evaluate your swallowing  I have also prescribed fluconazole  liquid to see if the pain when you swallow could be related to yeast infection of your throta -- take 5 mL a day, for 2 weeks

## 2024-10-11 NOTE — Progress Notes (Signed)
 "       [facility]  Patient Active Problem List   Diagnosis Date Noted   Nausea with vomiting 10/03/2024   Sepsis (HCC) 09/07/2024   C. difficile colitis 09/07/2024   Malignant neoplasm of lung (HCC) 09/07/2024   Neutropenic 09/06/2024   Chemotherapy induced neutropenia 09/04/2024   Obstructive pneumonia 08/15/2024   Acute hypoxic respiratory failure (HCC) 08/15/2024   Primary malignant neuroendocrine neoplasm of lung (HCC) 08/05/2024   Lung nodule 07/25/2024   Rib pain on right side 06/27/2024   Muscle spasm 06/27/2024   Right lower lobe lung mass 06/27/2024   Allergic rhinoconjunctivitis 06/27/2024   Type 2 diabetes with circulatory disorder causing erectile dysfunction (HCC) 06/27/2024   Encounter for general adult medical examination with abnormal findings 03/12/2024   Erectile dysfunction due to type 2 diabetes mellitus (HCC) 04/09/2021   Former smoker 09/02/2018   FHx: heart disease 09/02/2018   CKD stage 3 due to type 2 diabetes mellitus (HCC) 11/02/2017   Type 2 diabetes mellitus with stage 3 chronic kidney disease, without long-term current use of insulin  (HCC) 11/01/2014   Overweight (BMI 25.0-29.9) 07/25/2014   Hyperlipidemia associated with type 2 diabetes mellitus (HCC)    Gastroesophageal reflux disease    Essential hypertension    Vitamin D  deficiency    BPH with obstruction/lower urinary tract symptoms       Subjective:    Patient ID: Edward [name], male    DOB: Mar 06, 1941, 84 y.o.   MRN: [id number]  Chief Complaint  Patient presents with   New Patient (Initial Visit)    C-diff    HPI:  Edward [name] is a 84 y.o. male here for f/u cdiff hospital admission  Finished dificid  09/18/23 No pure diarrhea watery anymore but mixed formed/watery Seems very frequent still small bowel  Pain with swallowing food solid/liquid and food stuck in throat Hx recent chest radiation for NET  Chemo curently on hold  No  f/c  Poor appetite due to dysphagia Some nausea Has antiemetics and immodium  Uses immodium only once a day      Allergies[1]    Outpatient Medications Prior to Visit  Medication Sig Dispense Refill   acetaminophen  (TYLENOL ) 325 MG tablet Take 2 tablets (650 mg total) by mouth every 6 (six) hours as needed for mild pain (pain score 1-3) or fever (or Fever >/= 101).     albuterol  (ACCUNEB ) 1.25 MG/3ML nebulizer solution Inhale 3 mL (1.25 mg total) by nebulization every 6 (six) hours as needed for wheezing. (Patient taking differently: Take 1 ampule by nebulization See admin instructions. Nebulize 1.25 mg (1 ampule) and inhale into he lungs two times a day and an additional 2 times a day as needed for shortness of breath or wheezing) 75 mL 12   albuterol  (VENTOLIN  HFA) 108 (90 Base) MCG/ACT inhaler Inhale 2 puffs into the lungs every 4 (four) hours as needed for wheezing or shortness of breath. 6.7 g 2   alum & mag hydroxide-simeth (MAALOX/MYLANTA) 200-200-20 MG/5ML suspension Take 15 mLs by mouth every 4 (four) hours as needed for indigestion or heartburn. 355 mL 0   atorvastatin  (LIPITOR) 40 MG tablet Take 1 tablet (40 mg total) by mouth every evening for cholesterol 90 tablet 3   Cholecalciferol (VITAMIN D3) 125 MCG (5000 UT) CAPS Take 5,000 Units by mouth daily.     CINNAMON PO Take 1-2 capsules by mouth daily.     CLARITIN 10 MG tablet Take 10  mg by mouth daily.     Cyanocobalamin (VITAMIN B 12 PO) Take 1 tablet by mouth daily.     famotidine  (PEPCID ) 40 MG tablet Take 1 tablet (40 mg total) by mouth every evening to prevent heartburn and indigestion. (Patient taking differently: Take 40 mg by mouth See admin instructions. Take 40 mg by mouth in the evening as needed for reflux or indigestion) 90 tablet 0   glucose blood (ONETOUCH VERIO) test strip USE TO TEST BLOOD SUGAR ONCE DAILY 100 strip 12   glucose blood test strip Use 1 strip to check glucose once daily. 100 each 12   Lancets  (ONETOUCH ULTRASOFT) lancets Use as instructed 100 each 12   lidocaine  (XYLOCAINE ) 2 % solution Use as directed 15 mLs in the mouth or throat as needed for mouth pain. Swallow 20-30 minutes prior to meals and at bedtime 250 mL 1   losartan  (COZAAR ) 50 MG tablet Take 1 tablet (50 mg total) by mouth daily for blood pressure. 90 tablet 0   metoprolol  tartrate (LOPRESSOR ) 25 MG tablet Take 0.5 tablets (12.5 mg total) by mouth daily at 2 am. 90 tablet 0   OMEGA-3 FATTY ACIDS PO Take 1 capsule by mouth 3 (three) times a week.     ondansetron  (ZOFRAN ) 8 MG tablet Take 1 tablet (8 mg total) by mouth every 8 (eight) hours as needed for nausea or vomiting. Start on third day after chemotherapy. 30 tablet 1   potassium chloride  SA (KLOR-CON  M) 20 MEQ tablet Take 1 tablet (20 mEq total) by mouth 2 (two) times daily. 7 tablet 0   prochlorperazine  (COMPAZINE ) 10 MG tablet Take 1 tablet (10 mg total) by mouth every 6 (six) hours as needed for nausea or vomiting (Nausea or vomiting). 30 tablet 1   sucralfate  (CARAFATE ) 1 g tablet Take 1 tablet (1 g total) by mouth 4 (four) times daily -  with meals and at bedtime. 120 tablet 0   SYSTANE COMPLETE PF 0.6 % SOLN Place 1 drop into both eyes 3 (three) times daily as needed (for dryness or irritation).     tamsulosin  (FLOMAX ) 0.4 MG CAPS capsule Take 1 capsule (0.4 mg total) by mouth every evening for prostate. 90 capsule 0   zinc gluconate 50 MG tablet Take 50 mg by mouth daily.     No facility-administered medications prior to visit.     Social History   Socioeconomic History   Marital status: Married    Spouse name: Not on file   Number of children: Not on file   Years of education: Not on file   Highest education level: Not on file  Occupational History   Not on file  Tobacco Use   Smoking status: Former    Current packs/day: 0.00    Average packs/day: 0.5 packs/day for 10.0 years (5.0 ttl pk-yrs)    Types: Cigarettes    Start date: 09/12/1957    Quit  date: 09/13/1967    Years since quitting: 57.1    Passive exposure: Past   Smokeless tobacco: Never  Vaping Use   Vaping status: Never Used  Substance and Sexual Activity   Alcohol  use: No   Drug use: No   Sexual activity: Not Currently  Other Topics Concern   Not on file  Social History Narrative   Married   Social Drivers of Health   Tobacco Use: Medium Risk (10/11/2024)   Patient History    Smoking Tobacco Use: Former    Smokeless Tobacco  Use: Never    Passive Exposure: Past  Financial Resource Strain: Low Risk (01/09/2024)   Overall Financial Resource Strain (CARDIA)    Difficulty of Paying Living Expenses: Not hard at all  Food Insecurity: No Food Insecurity (09/06/2024)   Epic    Worried About Programme Researcher, Broadcasting/film/video in the Last Year: Never true    Ran Out of Food in the Last Year: Never true  Transportation Needs: No Transportation Needs (09/06/2024)   Epic    Lack of Transportation (Medical): No    Lack of Transportation (Non-Medical): No  Physical Activity: Sufficiently Active (01/09/2024)   Exercise Vital Sign    Days of Exercise per Week: 4 days    Minutes of Exercise per Session: 60 min  Stress: No Stress Concern Present (01/09/2024)   [employer] of Occupational Health - Occupational Stress Questionnaire    Feeling of Stress : Not at all  Social Connections: Socially Integrated (09/06/2024)   Social Connection and Isolation Panel    Frequency of Communication with Friends and Family: Three times a week    Frequency of Social Gatherings with Friends and Family: Three times a week    Attends Religious Services: More than 4 times per year    Active Member of Clubs or Organizations: Yes    Attends Banker Meetings: 1 to 4 times per year    Marital Status: Married  Catering Manager Violence: Not At Risk (09/06/2024)   Epic    Fear of Current or Ex-Partner: No    Emotionally Abused: No    Physically Abused: No    Sexually Abused: No  Depression  (PHQ2-9): High Risk (09/25/2024)   Depression (PHQ2-9)    PHQ-2 Score: 17  Alcohol  Screen: Low Risk (01/09/2024)   Alcohol  Screen    Last Alcohol  Screening Score (AUDIT): 0  Housing: Low Risk (09/06/2024)   Epic    Unable to Pay for Housing in the Last Year: No    Number of Times Moved in the Last Year: 0    Homeless in the Last Year: No  Utilities: Not At Risk (09/06/2024)   Epic    Threatened with loss of utilities: No  Health Literacy: Adequate Health Literacy (01/09/2024)   B1300 Health Literacy    Frequency of need for help with medical instructions: Never      Review of Systems    All other ros negative  Objective:    BP (!) 75/51   Pulse 71   Temp 98.2 F (36.8 C) (Oral)   SpO2 94%  Nursing note and vital signs reviewed.  Physical Exam      General/constitutional: no distress, pleasant HEENT: Normocephalic, PER, Conj Clear, EOMI, no oral thrush CV: rrr no mrg Lungs: clear to auscultation, normal respiratory effort Abd: Soft, Nontender Ext: no edema Skin: No Rash Neuro: nonfocal MSK: no peripheral joint swelling/tenderness/warmth; back spines nontender   Labs: Lab Results  Component Value Date   WBC 3.4 (L) 10/08/2024   HGB 9.2 (L) 10/08/2024   HCT 27.7 (L) 10/08/2024   MCV 85.5 10/08/2024   PLT 77 (L) 10/08/2024   Last metabolic panel Lab Results  Component Value Date   GLUCOSE 117 (H) 10/03/2024   NA 136 10/03/2024   K 3.7 10/03/2024   CL 101 10/03/2024   CO2 24 10/03/2024   BUN 16 10/03/2024   CREATININE 1.13 10/03/2024   GFRNONAA >60 10/03/2024   CALCIUM  8.2 (L) 10/03/2024   PHOS 2.9 08/18/2024   PROT  6.0 (L) 10/03/2024   ALBUMIN  3.4 (L) 10/03/2024   BILITOT 0.5 10/03/2024   ALKPHOS 64 10/03/2024   AST 14 (L) 10/03/2024   ALT 8 10/03/2024   ANIONGAP 12 10/03/2024    Micro:  Serology:  Imaging:  Assessment & Plan:   Problem List Items Addressed This Visit   None     Abx: 12/27-c difficid   12/26-c vanc 12/26-c  cefepime  12/26-c metronidazole                                                                   Assessment: 84 y.o. male former smoker, ckd3, dm2, bph, lbbb, hx kidney stone, recent neoadjuvant chemo for neuroendocrine tumor right lung mass admitted with lethargy and diarrhea    Patient s/p 3 weeks augmentin  and taking up to day of admission. Now with worsening diarrhea and testing confirmed cdiff   Chest ct showed improved right lung opacity but also in setting recent chemo and xrt.    Potential confounder for immediate future diagnostics include possibility of acute radiation pneumonitis. Something to considered with new respiratory sx   Patient without frank fever this admission. And so far hx sx seems to be related to cdiff and chemo/xrt side effect     I am suprprised given the soft/jello appearing stool that it was allowed to be tested for cdiff. He does have epigastric discomfort/nausea not entirely clear if cdiff or some other process that is noninfectious     Reasonable though given immunosuppressant to cover empirically for sepsis until repeat bcx returns. Given cdiff confirmation and lack of respiratory sx or other localizing infectious disease syndrome, I wouldn't prolonged post-obstructive pna tx or empiric abx beyond blood cx turn around   Solid tumor/chemo associated infection is usually bacterial process with pna/bacteremia. Pseudomonas a high risk player and so can cover for such   Hopefully in 2-3 days if gi sx all from cdiff he should start feeling better. It should be turn around time from chemo side effect as well     --------- 09/09/24 id assessmetn Bcx negative Mrsa nares pcr negative   Diarrhea worsened to > 6times a day yesterday but seems to slow down today; appetite decent and eating a good meal today lunch    -------- 10/11/24 id clinic assessment Patient reporting more formed but mixed with watery stool Yesterday he feels he has stool every  hour Today so far only once Appetite is not baseline Some nausea   If he could eat he thinks he would feel better. He is questioning if the chest radiation is causing the nausea. He does describe solid and liquid stuck and pain with swallowing   He is taking immodium only once a day He also has nausea medication from oncology   Plan: Stop systemic abx Continue difficid If diarrhea becomes more formed and < 3 times a day and eating well reasonable to discharge Plan 10 days difficid to finish 09/17/2024 Maintain cdiff enteric precaution Id will sign off Discussed with primary team   Follow-up: Return in about 4 weeks (around 11/08/2024).      Constance ONEIDA Passer, MD [facility] 10/11/2024, 10:38 AM     [1]  Allergies Allergen Reactions   Bystolic [  Nebivolol Hcl] Other (See Comments)    Reaction not recalled   "

## 2024-10-12 ENCOUNTER — Other Ambulatory Visit (HOSPITAL_COMMUNITY): Payer: Self-pay

## 2024-10-14 ENCOUNTER — Inpatient Hospital Stay

## 2024-10-15 ENCOUNTER — Ambulatory Visit: Payer: Self-pay

## 2024-10-15 ENCOUNTER — Inpatient Hospital Stay

## 2024-10-15 ENCOUNTER — Ambulatory Visit: Admitting: Thoracic Surgery (Cardiothoracic Vascular Surgery)

## 2024-10-15 MED FILL — Fosaprepitant Dimeglumine For IV Infusion 150 MG (Base Eq): INTRAVENOUS | Qty: 5 | Status: AC

## 2024-10-15 NOTE — Progress Notes (Signed)
 Spoke with patients spouse regarding imodium. Understands to take this three times a day. Will call back if symptoms do not improve or worsen.  Lorenda CHRISTELLA Code, RMA

## 2024-10-15 NOTE — Telephone Encounter (Signed)
 Spoke patient's spouse regarding results. Understands to increase fluid intake.  During call mentioned that Edward [name] is still c/o of diarrhea- has an episode of watery diarrhea every hour. Would like to know if something can be called into pharmacy.  Lorenda CHRISTELLA Code, RMA

## 2024-10-16 ENCOUNTER — Inpatient Hospital Stay: Attending: Internal Medicine

## 2024-10-16 ENCOUNTER — Inpatient Hospital Stay: Attending: Internal Medicine | Admitting: Internal Medicine

## 2024-10-16 ENCOUNTER — Encounter: Payer: Self-pay | Admitting: Nurse Practitioner

## 2024-10-16 ENCOUNTER — Inpatient Hospital Stay

## 2024-10-16 ENCOUNTER — Other Ambulatory Visit (HOSPITAL_COMMUNITY): Payer: Self-pay

## 2024-10-16 ENCOUNTER — Inpatient Hospital Stay: Admitting: Dietician

## 2024-10-16 ENCOUNTER — Inpatient Hospital Stay: Admitting: Nurse Practitioner

## 2024-10-16 DIAGNOSIS — C3490 Malignant neoplasm of unspecified part of unspecified bronchus or lung: Secondary | ICD-10-CM | POA: Diagnosis not present

## 2024-10-16 DIAGNOSIS — C7A8 Other malignant neuroendocrine tumors: Secondary | ICD-10-CM

## 2024-10-16 DIAGNOSIS — R530 Neoplastic (malignant) related fatigue: Secondary | ICD-10-CM

## 2024-10-16 DIAGNOSIS — R634 Abnormal weight loss: Secondary | ICD-10-CM

## 2024-10-16 DIAGNOSIS — Z7189 Other specified counseling: Secondary | ICD-10-CM

## 2024-10-16 DIAGNOSIS — Z515 Encounter for palliative care: Secondary | ICD-10-CM

## 2024-10-16 DIAGNOSIS — R630 Anorexia: Secondary | ICD-10-CM

## 2024-10-16 LAB — CMP (CANCER CENTER ONLY)
ALT: 8 U/L (ref 0–44)
AST: 18 U/L (ref 15–41)
Albumin: 2.9 g/dL — ABNORMAL LOW (ref 3.5–5.0)
Alkaline Phosphatase: 57 U/L (ref 38–126)
Anion gap: 11 (ref 5–15)
BUN: 27 mg/dL — ABNORMAL HIGH (ref 8–23)
CO2: 27 mmol/L (ref 22–32)
Calcium: 8.1 mg/dL — ABNORMAL LOW (ref 8.9–10.3)
Chloride: 101 mmol/L (ref 98–111)
Creatinine: 1.38 mg/dL — ABNORMAL HIGH (ref 0.61–1.24)
GFR, Estimated: 51 mL/min — ABNORMAL LOW
Glucose, Bld: 196 mg/dL — ABNORMAL HIGH (ref 70–99)
Potassium: 3.2 mmol/L — ABNORMAL LOW (ref 3.5–5.1)
Sodium: 138 mmol/L (ref 135–145)
Total Bilirubin: 0.5 mg/dL (ref 0.0–1.2)
Total Protein: 5.1 g/dL — ABNORMAL LOW (ref 6.5–8.1)

## 2024-10-16 LAB — CBC WITH DIFFERENTIAL (CANCER CENTER ONLY)
Abs Immature Granulocytes: 0.01 10*3/uL (ref 0.00–0.07)
Basophils Absolute: 0 10*3/uL (ref 0.0–0.1)
Basophils Relative: 0 %
Eosinophils Absolute: 0 10*3/uL (ref 0.0–0.5)
Eosinophils Relative: 0 %
HCT: 27.2 % — ABNORMAL LOW (ref 39.0–52.0)
Hemoglobin: 8.6 g/dL — ABNORMAL LOW (ref 13.0–17.0)
Immature Granulocytes: 0 %
Lymphocytes Relative: 21 %
Lymphs Abs: 0.8 10*3/uL (ref 0.7–4.0)
MCH: 28.1 pg (ref 26.0–34.0)
MCHC: 31.6 g/dL (ref 30.0–36.0)
MCV: 88.9 fL (ref 80.0–100.0)
Monocytes Absolute: 0.2 10*3/uL (ref 0.1–1.0)
Monocytes Relative: 4 %
Neutro Abs: 2.9 10*3/uL (ref 1.7–7.7)
Neutrophils Relative %: 75 %
Platelet Count: 133 10*3/uL — ABNORMAL LOW (ref 150–400)
RBC: 3.06 MIL/uL — ABNORMAL LOW (ref 4.22–5.81)
RDW: 17.2 % — ABNORMAL HIGH (ref 11.5–15.5)
WBC Count: 3.9 10*3/uL — ABNORMAL LOW (ref 4.0–10.5)
nRBC: 0 % (ref 0.0–0.2)

## 2024-10-16 LAB — SAMPLE TO BLOOD BANK

## 2024-10-16 MED ORDER — SODIUM CHLORIDE 0.9 % IV SOLN: INTRAVENOUS | Status: AC

## 2024-10-16 NOTE — Progress Notes (Signed)
 Nutrition Follow-up:  Pt with stage III NSCLC. He is receiving concurrent chemoradiation with carboplatin  + etoposide  q21d. Patient completed radiation 1/28. Patient under the care of Dr. Sherrod   12/26-1/5 admission with neutropenia + Cdiff  Precautions noted due to +Cdiff - receiving IVF today  Met with patient in infusion. Patient reports feeling tired all the time. Says he just wants to stay in the bed. Patient reports appetite is starting to improve. Has eaten 2 peanut butter crackers prior to visit. Taste is getting better. He is able to taste soda today. This is delicious, but is not going to drink too much of this. Diarrhea has slowed. No movement overnight and none so far today.   Medications: reviewed   Labs: K 3.2, glucose 196, BUN 27, Cr 1.38, albumin  2.9  Anthropometrics: Wt 136 lb 11 oz today decreased 14.5% in the last 7 weeks - this is severe for time frame  1/22 - 145 lb  12/17 - 159 lb 8 oz   NUTRITION DIAGNOSIS: Inadequate oral intake continues    MALNUTRITION DIAGNOSIS: Patient meets criteria for severe malnutrition in the context of chronic disease as evidenced by intake <75% of needs >1 month secondary to diarrhea, 14.5% wt loss in 7 weeks which is severe   INTERVENTION:  Educated on strategies for diarrhea, foods best tolerated and foods to avoid - handout provided + samples of Banatrol  Antidiarrheals per MD Encourage small meals/snacks q3h - recommend protein source at every meal Samples of Liquid IV provided - encourage daily electrolyte drink to support GI losses     MONITORING, EVALUATION, GOAL: wt trends, intake   NEXT VISIT: Friday February 27 during infusion with Joli

## 2024-10-16 NOTE — Progress Notes (Unsigned)
 "     [facility] [phone]) [phone]   Fax:([phone]  OFFICE PROGRESS NOTE  [name], NP-C [address] [place] [address]  DIAGNOSIS:  Stage IIIC (T3, N3, M0) non-small cell lung cancer, well-differentiated neuroendocrine tumor, suspicious for atypical carcinoid presented with right suprahilar mass with occlusion of the right middle lobe and constriction of the right lower lobe bronchus in addition to hypermetabolic lymph node extending to the contralateral mediastinum and possibly left internal jugular station.  Diagnosed in November 2025  PRIOR THERAPY: None  CURRENT THERAPY: Systemic chemotherapy with carboplatin  for AUC of 5 on day 1 and etoposide  100 mg/M2 on days 1, 2 and 3 every 3 weeks concurrent with radiotherapy.  First dose August 27, 2024.  Status post 2 cycles.  INTERVAL HISTORY: Edward [name] 84 y.o. male returns to the clinic today for follow-up visit accompanied by his wife.Discussed the use of AI scribe software for clinical note transcription with the patient, who gave verbal consent to proceed.  History of Present Illness Edward [name] is an 84 year old male with stage IIIC non-small cell lung cancer and well-differentiated neuroendocrine tumor who presents for evaluation prior to cycle three of chemotherapy.  He was diagnosed in November 2025 with stage IIIC non-small cell lung cancer, suspicious for typical carcinoid, characterized by a right suprahilar mass, occlusion of the right middle lobe, constriction of the right lower lobe bronchus, and hypermetabolic lymphadenopathy extending to the contralateral mediastinum and left internal jugular station. He has completed two cycles of carboplatin  and etoposide  with concurrent radiation therapy and is currently being evaluated prior to initiation of cycle three.  He continues to experience significant weakness and fatigue, with ongoing poor oral intake, difficulty  ambulating, and persistent weight loss. His wife notes his reduced mobility, and he expresses a desire to rest.  His clinical course has been complicated by recurrent Clostridioides difficile infection, initially treated in the hospital last week. He subsequently developed a recurrence and was evaluated by infectious disease, who prescribed a syringe for oral thrush, though thrush was not confirmed. No additional medication for C. difficile was provided at that time.       MEDICAL HISTORY: Past Medical History:  Diagnosis Date   BPH (benign prostatic hyperplasia)    Cancer (HCC)    Cataract    surgical removal bilateral   CKD (chronic kidney disease) stage 3, GFR 30-59 ml/min (HCC)    DM (diabetes mellitus), type 2 (HCC)    ED (erectile dysfunction)    GERD (gastroesophageal reflux disease)    History of adenomatous polyp of colon 11/02/2017   Tubular adenoma 2013; recommended 5 year follow up   History of kidney stones    Hyperlipidemia    Hypertension    IBS (irritable bowel syndrome)    Kidney stones 02/11/2020   LBBB (left bundle branch block)    Lung nodule    Personal history of kidney stones    Vitamin D  deficiency     ALLERGIES:  is allergic to bystolic [nebivolol hcl].  MEDICATIONS:  Current Outpatient Medications  Medication Sig Dispense Refill   acetaminophen  (TYLENOL ) 325 MG tablet Take 2 tablets (650 mg total) by mouth every 6 (six) hours as needed for mild pain (pain score 1-3) or fever (or Fever >/= 101).     albuterol  (ACCUNEB ) 1.25 MG/3ML nebulizer solution Inhale 3 mL (1.25 mg total) by nebulization every 6 (six) hours as needed for wheezing. (Patient taking differently: Take  1 ampule by nebulization See admin instructions. Nebulize 1.25 mg (1 ampule) and inhale into he lungs two times a day and an additional 2 times a day as needed for shortness of breath or wheezing) 75 mL 12   albuterol  (VENTOLIN  HFA) 108 (90 Base) MCG/ACT inhaler Inhale 2 puffs into the  lungs every 4 (four) hours as needed for wheezing or shortness of breath. 6.7 g 2   alum & mag hydroxide-simeth (MAALOX/MYLANTA) 200-200-20 MG/5ML suspension Take 15 mLs by mouth every 4 (four) hours as needed for indigestion or heartburn. 355 mL 0   atorvastatin  (LIPITOR) 40 MG tablet Take 1 tablet (40 mg total) by mouth every evening for cholesterol 90 tablet 3   Cholecalciferol (VITAMIN D3) 125 MCG (5000 UT) CAPS Take 5,000 Units by mouth daily.     CINNAMON PO Take 1-2 capsules by mouth daily.     CLARITIN 10 MG tablet Take 10 mg by mouth daily.     Cyanocobalamin (VITAMIN B 12 PO) Take 1 tablet by mouth daily.     famotidine  (PEPCID ) 40 MG tablet Take 1 tablet (40 mg total) by mouth every evening to prevent heartburn and indigestion. (Patient taking differently: Take 40 mg by mouth See admin instructions. Take 40 mg by mouth in the evening as needed for reflux or indigestion) 90 tablet 0   fluconazole  (DIFLUCAN ) 40 MG/ML suspension Take 5 mLs (200 mg total) by mouth daily for 14 days. 70 mL 0   glucose blood (ONETOUCH VERIO) test strip USE TO TEST BLOOD SUGAR ONCE DAILY 100 strip 12   glucose blood test strip Use 1 strip to check glucose once daily. 100 each 12   Lancets (ONETOUCH ULTRASOFT) lancets Use as instructed 100 each 12   lidocaine  (XYLOCAINE ) 2 % solution Use as directed 15 mLs in the mouth or throat as needed for mouth pain. Swallow 20-30 minutes prior to meals and at bedtime 250 mL 1   losartan  (COZAAR ) 50 MG tablet Take 1 tablet (50 mg total) by mouth daily for blood pressure. 90 tablet 0   metoprolol  tartrate (LOPRESSOR ) 25 MG tablet Take 0.5 tablets (12.5 mg total) by mouth daily at 2 am. 90 tablet 0   OMEGA-3 FATTY ACIDS PO Take 1 capsule by mouth 3 (three) times a week.     ondansetron  (ZOFRAN ) 8 MG tablet Take 1 tablet (8 mg total) by mouth every 8 (eight) hours as needed for nausea or vomiting. Start on third day after chemotherapy. 30 tablet 1   potassium chloride  SA  (KLOR-CON  M) 20 MEQ tablet Take 1 tablet (20 mEq total) by mouth 2 (two) times daily. 7 tablet 0   prochlorperazine  (COMPAZINE ) 10 MG tablet Take 1 tablet (10 mg total) by mouth every 6 (six) hours as needed for nausea or vomiting (Nausea or vomiting). 30 tablet 1   sucralfate  (CARAFATE ) 1 g tablet Take 1 tablet (1 g total) by mouth 4 (four) times daily -  with meals and at bedtime. 120 tablet 0   SYSTANE COMPLETE PF 0.6 % SOLN Place 1 drop into both eyes 3 (three) times daily as needed (for dryness or irritation).     tamsulosin  (FLOMAX ) 0.4 MG CAPS capsule Take 1 capsule (0.4 mg total) by mouth every evening for prostate. 90 capsule 0   zinc gluconate 50 MG tablet Take 50 mg by mouth daily.     No current facility-administered medications for this visit.    SURGICAL HISTORY:  Past Surgical History:  Procedure  Laterality Date   CATARACT EXTRACTION Right 2015   CATARACT EXTRACTION W/ INTRAOCULAR LENS IMPLANT  2013   left   COLONOSCOPY  2013   TA   ENDOBRONCHIAL ULTRASOUND Bilateral 07/30/2024   Procedure: ENDOBRONCHIAL ULTRASOUND (EBUS);  Surgeon: Isadora Hose, MD;  Location: [facility] ORS;  Service: Pulmonary;  Laterality: Bilateral;   EXTRACORPOREAL SHOCK WAVE LITHOTRIPSY Left 12/11/2017   Procedure: LEFT EXTRACORPOREAL SHOCK WAVE LITHOTRIPSY (ESWL);  Surgeon: Carolee Sherwood JONETTA DOUGLAS, MD;  Location: [facility];  Service: Urology;  Laterality: Left;   KIDNEY STONE SURGERY     POLYPECTOMY     VIDEO BRONCHOSCOPY WITH ENDOBRONCHIAL ULTRASOUND Bilateral 07/18/2024   Procedure: BRONCHOSCOPY, WITH EBUS;  Surgeon: Catherine Cools, MD;  Location: [facility];  Service: Pulmonary;  Laterality: Bilateral;    REVIEW OF SYSTEMS:  Constitutional: positive for fatigue Eyes: negative Ears, nose, mouth, throat, and face: negative Respiratory: negative Cardiovascular: negative Gastrointestinal: positive for diarrhea Genitourinary:negative Integument/breast: negative Hematologic/lymphatic:  negative Musculoskeletal:negative Neurological: negative Behavioral/Psych: negative Endocrine: negative Allergic/Immunologic: negative   PHYSICAL EXAMINATION: General appearance: alert, cooperative, fatigued, and no distress Head: Normocephalic, without obvious abnormality, atraumatic Neck: no adenopathy, no JVD, supple, symmetrical, trachea midline, and thyroid  not enlarged, symmetric, no tenderness/mass/nodules Lymph nodes: Cervical, supraclavicular, and axillary nodes normal. Resp: clear to auscultation bilaterally Back: symmetric, no curvature. ROM normal. No CVA tenderness. Cardio: regular rate and rhythm, S1, S2 normal, no murmur, click, rub or gallop GI: soft, non-tender; bowel sounds normal; no masses,  no organomegaly Extremities: extremities normal, atraumatic, no cyanosis or edema Neurologic: Alert and oriented X 3, normal strength and tone. Normal symmetric reflexes. Normal coordination and gait  ECOG PERFORMANCE STATUS: 1 - Symptomatic but completely ambulatory  There were no vitals taken for this visit.  LABORATORY DATA: Lab Results  Component Value Date   WBC 5.4 10/11/2024   HGB 9.0 (L) 10/11/2024   HCT 28.3 (L) 10/11/2024   MCV 88.2 10/11/2024   PLT 100 (L) 10/11/2024      Chemistry      Component Value Date/Time   NA 139 10/11/2024 1102   K 2.8 (L) 10/11/2024 1102   CL 101 10/11/2024 1102   CO2 26 10/11/2024 1102   BUN 26 (H) 10/11/2024 1102   CREATININE 1.25 (H) 10/11/2024 1102      Component Value Date/Time   CALCIUM  8.3 (L) 10/11/2024 1102   ALKPHOS 64 10/03/2024 1537   AST 11 10/11/2024 1102   AST 14 (L) 10/03/2024 1537   ALT 8 (L) 10/11/2024 1102   ALT 8 10/03/2024 1537   BILITOT 0.6 10/11/2024 1102   BILITOT 0.5 10/03/2024 1537       RADIOGRAPHIC STUDIES: VAS US  LOWER EXTREMITY VENOUS (DVT) Result Date: 09/18/2024  Lower Venous DVT Study Patient Name:  Edward [name]  Date of Exam:   09/18/2024 Medical Rec #: [id number]           Accession #:    [id number] Date of Birth: August 26, 1941          Patient Gender: M Patient Age:   55 years Exam Location:  [address] Procedure:      VAS US  LOWER EXTREMITY VENOUS (DVT) Referring Phys: LYNWOOD NASUTI --------------------------------------------------------------------------------  Indications: Edema.  Risk Factors: Cancer lung. Performing Technologist: Stoney Ross RVT  Examination Guidelines: A complete evaluation includes B-mode imaging, spectral Doppler, color Doppler, and power Doppler as needed of all accessible portions of each vessel. Bilateral testing is considered an integral part of a complete examination. Limited examinations for  reoccurring indications may be performed as noted. The reflux portion of the exam is performed with the patient in reverse Trendelenburg.  +---------+---------------+---------+-----------+----------+--------------+ RIGHT    CompressibilityPhasicitySpontaneityPropertiesThrombus Aging +---------+---------------+---------+-----------+----------+--------------+ CFV      Full           Yes      Yes                                 +---------+---------------+---------+-----------+----------+--------------+ SFJ      Full                    Yes                                 +---------+---------------+---------+-----------+----------+--------------+ FV Prox  Full                    Yes                                 +---------+---------------+---------+-----------+----------+--------------+ FV Mid   Full           Yes      Yes                                 +---------+---------------+---------+-----------+----------+--------------+ FV DistalFull                    Yes                                 +---------+---------------+---------+-----------+----------+--------------+ PFV      Full                    Yes                                 +---------+---------------+---------+-----------+----------+--------------+ POP       Full           Yes      Yes                                 +---------+---------------+---------+-----------+----------+--------------+ PTV      Full                    Yes                                 +---------+---------------+---------+-----------+----------+--------------+ PERO     Full                    Yes                                 +---------+---------------+---------+-----------+----------+--------------+ Gastroc  Full                                                        +---------+---------------+---------+-----------+----------+--------------+  GSV      Full                    Yes                                 +---------+---------------+---------+-----------+----------+--------------+  +---------+---------------+---------+-----------+----------+--------------+ LEFT     CompressibilityPhasicitySpontaneityPropertiesThrombus Aging +---------+---------------+---------+-----------+----------+--------------+ CFV      Full           Yes      Yes                                 +---------+---------------+---------+-----------+----------+--------------+ SFJ      Full                    Yes                                 +---------+---------------+---------+-----------+----------+--------------+ FV Prox  Full                    Yes                                 +---------+---------------+---------+-----------+----------+--------------+ FV Mid   Full           Yes      Yes                                 +---------+---------------+---------+-----------+----------+--------------+ FV DistalFull                    Yes                                 +---------+---------------+---------+-----------+----------+--------------+ PFV      Full                    Yes                                 +---------+---------------+---------+-----------+----------+--------------+ POP      Full           Yes      Yes                                  +---------+---------------+---------+-----------+----------+--------------+ PTV      Full                    Yes                                 +---------+---------------+---------+-----------+----------+--------------+ PERO     Full                    Yes                                 +---------+---------------+---------+-----------+----------+--------------+ Gastroc  Full                                                        +---------+---------------+---------+-----------+----------+--------------+  GSV      Full                    Yes                                 +---------+---------------+---------+-----------+----------+--------------+  Findings reported to Preliminary report routed to Dr. Shannon at 445.  Summary: RIGHT: - There is no evidence of deep vein thrombosis in the lower extremity. - There is no evidence of superficial venous thrombosis.  - No cystic structure found in the popliteal fossa.  LEFT: - There is no evidence of deep vein thrombosis in the lower extremity. - There is no evidence of superficial venous thrombosis.  - No cystic structure found in the popliteal fossa.  *Interstitial fluid noted in the bilateral calves. *See table(s) above for measurements and observations. Electronically signed by Gaile New MD on 09/18/2024 at 7:09:57 PM.    Final     ASSESSMENT AND PLAN: This is a very pleasant 84 years old white male with stage IIIC (T3, N3, M0) small cell lung cancer, well-differentiated neuroendocrine tumor, suspicious for atypical carcinoid presented with right suprahilar mass with occlusion of the right middle lobe and constriction of the right lower lobe bronchus in addition to hypermetabolic lymph node extending to the contralateral mediastinum and possibly left internal jugular station.  Diagnosed in November 2025. He started a course of systemic chemotherapy with carboplatin  for AUC of 5 on day 1 and Doutova side 100 mg/M2 on  days 1, 2 and 3 concurrent with radiation.  Status post 2 cycles. Assessment and Plan Assessment & Plan Stage IIIc non-small cell lung cancer, well-differentiated neuroendocrine tumor Completed two cycles of carboplatin  and etoposide  with concurrent radiation. Experiencing significant clinical decline with persistent weakness, poor oral intake, weight loss, and recent Clostridioides difficile infection. High risk for further deterioration; chemotherapy withheld to prevent exacerbation of symptoms and allow for recovery. Disease status to be reassessed prior to further treatment. - Withheld chemotherapy due to clinical decline and risk of worsening symptoms. - Ordered chest CT for next week to reassess tumor status and response to initial treatment. - Scheduled oncology follow-up in two weeks to review imaging and reassess treatment plan.  Dehydration and weakness Ongoing weakness and dehydration likely multifactorial, related to malignancy, recent chemotherapy, poor oral intake, and recent C. difficile infection. Continued risk due to weight loss and poor nutritional status. - Administered intravenous fluids to address dehydration and support clinical status. - Instructed nursing staff to provide fluids only and withhold chemotherapy during this visit.   The patient voices understanding of current disease status and treatment options and is in agreement with the current care plan.  All questions were answered. The patient knows to call the clinic with any problems, questions or concerns. We can certainly see the patient much sooner if necessary.  The total time spent in the appointment was 35 minutes including review of chart and various tests results, discussions about plan of care and coordination of care plan .   Disclaimer: This note was dictated with voice recognition software. Similar sounding words can inadvertently be transcribed and may not be corrected upon review.        "

## 2024-10-16 NOTE — Progress Notes (Signed)
 "    [facility]alliative [name]  [phone] [phone]   Name: Edward [name] Date: 10/16/2024 MRN: [id number]  DOB: 25-Dec-1940  Patient Care Team: [name], NP-C as PCP - General (Family [name]) Leslee Reusing, MD as Consulting Physician (Ophthalmology) Debrah Lamar BIRCH, MD (Inactive) as Consulting Physician (Gastroenterology) Carolee Sherwood BIRCH DOUGLAS, MD as Consulting Physician (Urology) Nathanael Davies, [facility] (Inactive) as Pharmacist (Pharmacist) Prentis Duwaine BROCKS, RN as Oncology Nurse Navigator Pickenpack-Cousar, Fannie SAILOR, NP as Nurse Practitioner Edward [name])    INTERVAL HISTORY: Edward [name] is a 84 y.o. male with oncologic medical history including stage III well-differentiated neuroendocrine tumor (November 2025) currently undergoing systemic chemotherapy with carboplatin  and etoposide  concurrent with radiotherapy.  Palliative is seeing patient for symptom management and goals of care.   SOCIAL HISTORY:     reports that he quit smoking about 57 years ago. His smoking use included cigarettes. He started smoking about 67 years ago. He has a 5 pack-year smoking history. He has been exposed to tobacco smoke. He has never used smokeless tobacco. He reports that he does not drink alcohol  and does not use drugs.  ADVANCE DIRECTIVES:  MOST form on file. Patient has a documented advanced directive with plans to bring in at follow-up visit. Edward [name] is his medical decision-maker in the event he cannot speak for himself.   CODE STATUS: DNR  PAST MEDICAL HISTORY: Past Medical History:  Diagnosis Date   BPH (benign prostatic hyperplasia)    Cancer (HCC)    Cataract    surgical removal bilateral   CKD (chronic kidney disease) stage 3, GFR 30-59 ml/min (HCC)    DM (diabetes mellitus), type 2 (HCC)    ED (erectile dysfunction)    GERD (gastroesophageal reflux disease)    History of adenomatous polyp of colon  11/02/2017   Tubular adenoma 2013; recommended 5 year follow up   History of kidney stones    Hyperlipidemia    Hypertension    IBS (irritable bowel syndrome)    Kidney stones 02/11/2020   LBBB (left bundle branch block)    Lung nodule    Personal history of kidney stones    Vitamin D  deficiency     ALLERGIES:  is allergic to bystolic [nebivolol hcl].  MEDICATIONS:  Current Outpatient Medications  Medication Sig Dispense Refill   acetaminophen  (TYLENOL ) 325 MG tablet Take 2 tablets (650 mg total) by mouth every 6 (six) hours as needed for mild pain (pain score 1-3) or fever (or Fever >/= 101).     albuterol  (ACCUNEB ) 1.25 MG/3ML nebulizer solution Inhale 3 mL (1.25 mg total) by nebulization every 6 (six) hours as needed for wheezing. (Patient taking differently: Take 1 ampule by nebulization See admin instructions. Nebulize 1.25 mg (1 ampule) and inhale into he lungs two times a day and an additional 2 times a day as needed for shortness of breath or wheezing) 75 mL 12   albuterol  (VENTOLIN  HFA) 108 (90 Base) MCG/ACT inhaler Inhale 2 puffs into the lungs every 4 (four) hours as needed for wheezing or shortness of breath. 6.7 g 2   alum & mag hydroxide-simeth (MAALOX/MYLANTA) 200-200-20 MG/5ML suspension Take 15 mLs by mouth every 4 (four) hours as needed for indigestion or heartburn. 355 mL 0   atorvastatin  (LIPITOR) 40 MG tablet Take 1 tablet (40 mg total) by mouth every evening for cholesterol 90 tablet 3   Cholecalciferol (VITAMIN D3) 125 MCG (5000 UT) CAPS Take  5,000 Units by mouth daily.     CINNAMON PO Take 1-2 capsules by mouth daily.     CLARITIN 10 MG tablet Take 10 mg by mouth daily.     Cyanocobalamin (VITAMIN B 12 PO) Take 1 tablet by mouth daily.     famotidine  (PEPCID ) 40 MG tablet Take 1 tablet (40 mg total) by mouth every evening to prevent heartburn and indigestion. (Patient taking differently: Take 40 mg by mouth See admin instructions. Take 40 mg by mouth in the evening  as needed for reflux or indigestion) 90 tablet 0   fluconazole  (DIFLUCAN ) 40 MG/ML suspension Take 5 mLs (200 mg total) by mouth daily for 14 days. 70 mL 0   glucose blood (ONETOUCH VERIO) test strip USE TO TEST BLOOD SUGAR ONCE DAILY 100 strip 12   glucose blood test strip Use 1 strip to check glucose once daily. 100 each 12   Lancets (ONETOUCH ULTRASOFT) lancets Use as instructed 100 each 12   lidocaine  (XYLOCAINE ) 2 % solution Use as directed 15 mLs in the mouth or throat as needed for mouth pain. Swallow 20-30 minutes prior to meals and at bedtime 250 mL 1   losartan  (COZAAR ) 50 MG tablet Take 1 tablet (50 mg total) by mouth daily for blood pressure. 90 tablet 0   metoprolol  tartrate (LOPRESSOR ) 25 MG tablet Take 0.5 tablets (12.5 mg total) by mouth daily at 2 am. 90 tablet 0   OMEGA-3 FATTY ACIDS PO Take 1 capsule by mouth 3 (three) times a week.     potassium chloride  SA (KLOR-CON  M) 20 MEQ tablet Take 1 tablet (20 mEq total) by mouth 2 (two) times daily. 7 tablet 0   sucralfate  (CARAFATE ) 1 g tablet Take 1 tablet (1 g total) by mouth 4 (four) times daily -  with meals and at bedtime. 120 tablet 0   SYSTANE COMPLETE PF 0.6 % SOLN Place 1 drop into both eyes 3 (three) times daily as needed (for dryness or irritation).     tamsulosin  (FLOMAX ) 0.4 MG CAPS capsule Take 1 capsule (0.4 mg total) by mouth every evening for prostate. 90 capsule 0   zinc gluconate 50 MG tablet Take 50 mg by mouth daily.     No current facility-administered medications for this visit.    VITAL SIGNS: There were no vitals taken for this visit. There were no vitals filed for this visit.  Estimated body mass index is 20.18 kg/m as calculated from the following:   Height as of an earlier encounter on 10/16/24: 5' 9 (1.753 m).   Weight as of an earlier encounter on 10/16/24: 136 lb 11 oz (62 kg).   PERFORMANCE STATUS (ECOG) : 1 - Symptomatic but completely ambulatory   Physical Exam Vitals reviewed.   Constitutional:      Comments: Fatigue, thin   Cardiovascular:     Rate and Rhythm: Normal rate and regular rhythm.  Neurological:     Mental Status: He is oriented to person, place, and time. Mental status is at baseline.    IMPRESSION: Discussed the use of AI scribe software for clinical note transcription with the patient, who gave verbal consent to proceed.  History of Present Illness Edward [name] is an 84 year old male with stage III well-differentiated neuroendocrine tumor who was seen during infusion for symptom management follow-up and support.  Wife is present.  Patient fatigue and weakness.    He is currently dealing with a C. difficile infection, which has  contributed to diarrhea. His bowel movements have improved today. He has been prescribed a medication in a syringe form for oral thrush, which has helped alleviate symptoms.  No oral thrush noted on exam.  He reports a persistent dry mouth and occasional sore throat, particularly when swallowing. No nausea, vomiting, or constipation. His voice is weak.  We discussed ways to manage dry mouth and sore throat attributed to oral dryness.  He has experienced significant weight loss, losing approximately 40 pounds recently. His weight today is 136 pounds, down from 145 pounds on January 26 and 154 pounds earlier in January. He has a lack of appetite and difficulty eating, consuming minimal amounts of food such as two grapes and two crackers today. Although his taste is returning slightly, eating still causes discomfort and a sensation of food getting 'stuck'.  He has difficulty consuming nutritional supplements like Ensure or Boost, expressing after taste.  Wife reports they have refrigerator full of supplements.  A powder supplement has been suggested, but wife is concerned that he he struggles to eat enough food to mix it with.  We discussed focusing on small frequent meals versus large meals and consuming as much  protein intake as possible.  Patient's treatment has been delayed today per oncology.  We discussed continuing to take things 1 day at a time and closely monitoring having ongoing goals of care discussions as appropriate.  Assessment & Plan Stage IIIc well-differentiated neuroendocrine tumor Chemotherapy is on hold due to poor nutritional status and weight loss. Significant weight loss and weakness are present, with a current weight of 136 pounds, down from 154 pounds on January 26th. Poor nutritional intake and dehydration are contributing to weakness and fatigue. Taste is returning, but appetite remains low. Hoarseness and dry mouth are likely related to treatment. Risks of continuing chemotherapy in current condition include further weight loss and weakness. Benefits of delaying chemotherapy include allowing time for nutritional recovery. - Will discuss with oncology regarding chemotherapy plan and potential resumption. - Will monitor nutritional status and weight closely. - Encouraged soft foods that are easier to swallow and may improve appetite.  Clostridioides difficile infection Diarrhea is present, but no bowel movements today. Risk of dehydration due to diarrhea and poor fluid intake. Dehydration exacerbates weakness and fatigue. - Ensure adequate fluid intake to prevent dehydration.  Cancer-related cachexia and dehydration Significant weight loss and weakness due to poor nutritional intake and dehydration. Current weight is 136 pounds, with a loss of 5-8 pounds every 1-2 weeks. Poor nutritional intake is contributing to cachexia and dehydration. Taste is returning, but appetite remains low. Soft foods are preferred for easier swallowing and may improve appetite. - Encouraged soft foods that are easier to swallow and may improve appetite. - Will monitor nutritional intake and weight closely. - Ensured adequate fluid intake to prevent dehydration.  Dysphagia Difficulty swallowing, with  pain when swallowing. Taste is returning, but appetite remains low. No signs of thrush in the mouth. Dry mouth is likely related to treatment. Soft foods are preferred for easier swallowing. - Encouraged soft foods that are easier to swallow. - Monitored for any signs of thrush or other oral infections.  I will plan to see patient back in 2-3 weeks. Sooner if needed.   Patient expressed understanding and was in agreement with this plan. He also understands that He can call the clinic at any time with any questions, concerns, or complaints.   Any controlled substances utilized were prescribed in the context of palliative  care. PDMP has been reviewed.   I personally spent a total of 30 minutes in the care of the patient today including preparing to see the patient, getting/reviewing separately obtained history, performing a medically appropriate exam/evaluation, counseling and educating, and documenting clinical information in the EHR. Visit consisted of counseling and education dealing with the complex and emotionally intense issues of symptom management and palliative care in the setting of serious and potentially life-threatening illness.  Levon Borer, AGPCNP-BC  Palliative [name]    "

## 2024-10-16 NOTE — Patient Instructions (Signed)
 Not Enough Water in the Body (Dehydration) in Adults: What to Know Dehydration is a condition in which there is not enough water or other fluids in the body. This happens when a person loses more fluids than they take in. Important organs cannot work right without the right amount of fluids. Any loss of fluids from the body can cause dehydration. Dehydration can be mild, worse, or very bad. It should be treated right away to keep it from getting very bad. What are the causes? Conditions that cause loss of water in the body. They include: Watery poop (diarrhea). Vomiting. Sweating a lot. Fever. Infection. Peeing (urinating) a lot. Not drinking enough fluids. Certain medicines, such as medicines that take extra fluid out of the body (diuretics). Lack of safe drinking water. Not being able to get enough water and food. What increases the risk? Having a long-term (chronic) illness that has not been treated the right way, such as: Diabetes. Heart disease. Kidney disease. Being 17 years of age or older. Having a disability. Living in a place that is high above the ground or sea (high in altitude). The thinner, drier air causes more fluid loss. Doing exercises that put stress on your body for a long time. Being active when in hot places. What are the signs or symptoms? Symptoms of dehydration depend on how bad it is. Mild or worse dehydration Thirst. Dry lips or dry mouth. Feeling dizzy or light-headed. Muscle cramps. Passing little pee or dark pee. Pee may be the color of tea. Headache. Very bad dehydration Changes in skin. Skin may: Be cold to the touch (clammy). Be blotchy or pale. Not go back to normal right after you pinch it and let it go. Little or no tears, pee, or sweat. Fast breathing. Low blood pressure. Weak pulse. Pulse that is more than 100 beats a minute when you are sitting still. Other changes, such as: Feeling very thirsty. Eyes that look hollow  (sunken). Cold hands and feet. Being confused. Being very tired (lethargic) or having trouble waking from sleep. Losing weight. Loss of consciousness. How is this treated? Treatment for this condition depends on how bad your dehydration is. Treatment should start right away. Do not wait until your condition gets very bad. Very bad dehydration is an emergency. You will need to go to a hospital. Mild or worse dehydration can be treated at home. You may be asked to: Drink more fluids. Drink an oral rehydration solution (ORS). This drink gives you the right amount of fluids, salts, and minerals (electrolytes). Very bad dehydration can be treated: With fluids through an IV tube. By correcting low levels of electrolytes in the body. By treating the problem that caused your dehydration. Follow these instructions at home: Oral rehydration solution If told by your doctor, drink an ORS: Make an ORS. Use instructions on the package. Start by drinking small amounts, about  cup (120 mL) every 5-10 minutes. Slowly drink more until you have had the amount that your doctor said to have.  Eating and drinking  Drink enough clear fluid to keep your pee pale yellow. If you were told to drink an ORS, finish the ORS first. Then, start slowly drinking other clear fluids. Drink fluids such as: Water. Do not drink only water. Doing that can make the salt (sodium) level in your body get too low. Water from ice chips you suck on. Fruit juice that you have added water to (diluted). Low-calorie sports drinks. Eat foods that have the right  amounts of salts and minerals, such as bananas, oranges, potatoes, tomatoes, or spinach. Do not drink alcohol . Avoid drinks that have caffeine or sugar. These include:: High-calorie sports drinks. Fruit juice that you did not add water to. Soda. Coffee or energy drinks. Avoid foods that are greasy or have a lot of fat or sugar. General instructions Take over-the-counter  and prescription medicines only as told by your doctor. Do not take sodium tablets. Doing that can make the salt level in your body get too high. Return to your normal activities as told by your doctor. Ask your doctor what activities are safe for you. Keep all follow-up visits. Your doctor may check and change your treatment. Contact a doctor if: You have pain in your belly (abdomen) and the pain: Gets worse. Stays in one place. You have a rash. You have a stiff neck. You get angry or annoyed more easily than normal. You are more tired or have a harder time waking than normal. You feel weak or dizzy. You feel very thirsty. Get help right away if: You have any symptoms of very bad dehydration. You vomit every time you eat or drink. Your vomiting gets worse, does not go away, or you vomit blood or green stuff. You are getting treatment, but symptoms are getting worse. You have a fever. You have a very bad headache. You have: Diarrhea that gets worse or does not go away. Blood in your poop (stool). This may cause poop to look black and tarry. No pee in 6-8 hours. Only a small amount of pee in 6-8 hours, and the pee is very dark. You have trouble breathing. These symptoms may be an emergency. Get help right away. Call 911. Do not wait to see if the symptoms will go away. Do not drive yourself to the hospital. This information is not intended to replace advice given to you by your health care provider. Make sure you discuss any questions you have with your health care provider. Document Revised: 07/05/2024 Document Reviewed: 03/28/2022 Elsevier Patient Education  2025 [employer].

## 2024-10-17 ENCOUNTER — Inpatient Hospital Stay

## 2024-10-17 ENCOUNTER — Encounter: Payer: Self-pay | Admitting: Physician Assistant

## 2024-10-17 ENCOUNTER — Other Ambulatory Visit (HOSPITAL_COMMUNITY): Payer: Self-pay

## 2024-10-18 ENCOUNTER — Ambulatory Visit: Admitting: Internal Medicine

## 2024-10-18 ENCOUNTER — Inpatient Hospital Stay

## 2024-10-18 ENCOUNTER — Emergency Department (HOSPITAL_COMMUNITY)

## 2024-10-18 ENCOUNTER — Encounter (HOSPITAL_COMMUNITY): Payer: Self-pay | Admitting: Internal Medicine

## 2024-10-18 ENCOUNTER — Other Ambulatory Visit: Payer: Self-pay

## 2024-10-18 ENCOUNTER — Other Ambulatory Visit (HOSPITAL_COMMUNITY): Payer: Self-pay

## 2024-10-18 ENCOUNTER — Telehealth: Payer: Self-pay | Admitting: Internal Medicine

## 2024-10-18 ENCOUNTER — Inpatient Hospital Stay (HOSPITAL_COMMUNITY): Admission: EM | Admit: 2024-10-18 | Source: Home / Self Care | Admitting: Internal Medicine

## 2024-10-18 DIAGNOSIS — R55 Syncope and collapse: Principal | ICD-10-CM | POA: Diagnosis present

## 2024-10-18 DIAGNOSIS — R634 Abnormal weight loss: Secondary | ICD-10-CM | POA: Diagnosis present

## 2024-10-18 DIAGNOSIS — C7A8 Other malignant neuroendocrine tumors: Secondary | ICD-10-CM | POA: Diagnosis present

## 2024-10-18 DIAGNOSIS — I4891 Unspecified atrial fibrillation: Secondary | ICD-10-CM | POA: Diagnosis present

## 2024-10-18 DIAGNOSIS — I493 Ventricular premature depolarization: Secondary | ICD-10-CM

## 2024-10-18 DIAGNOSIS — A0472 Enterocolitis due to Clostridium difficile, not specified as recurrent: Secondary | ICD-10-CM | POA: Diagnosis present

## 2024-10-18 DIAGNOSIS — I959 Hypotension, unspecified: Secondary | ICD-10-CM | POA: Diagnosis present

## 2024-10-18 DIAGNOSIS — E860 Dehydration: Secondary | ICD-10-CM

## 2024-10-18 LAB — CBC
HCT: 29.1 % — ABNORMAL LOW (ref 39.0–52.0)
Hemoglobin: 9.2 g/dL — ABNORMAL LOW (ref 13.0–17.0)
MCH: 28.8 pg (ref 26.0–34.0)
MCHC: 31.6 g/dL (ref 30.0–36.0)
MCV: 90.9 fL (ref 80.0–100.0)
Platelets: 166 10*3/uL (ref 150–400)
RBC: 3.2 MIL/uL — ABNORMAL LOW (ref 4.22–5.81)
RDW: 17.7 % — ABNORMAL HIGH (ref 11.5–15.5)
WBC: 5.3 10*3/uL (ref 4.0–10.5)
nRBC: 0 % (ref 0.0–0.2)

## 2024-10-18 LAB — I-STAT CG4 LACTIC ACID, ED
Lactic Acid, Venous: 2.8 mmol/L (ref 0.5–1.9)
Lactic Acid, Venous: 1.7 mmol/L (ref 0.5–1.9)

## 2024-10-18 LAB — COMPREHENSIVE METABOLIC PANEL WITH GFR
ALT: 9 U/L (ref 0–44)
AST: 23 U/L (ref 15–41)
Albumin: 2.8 g/dL — ABNORMAL LOW (ref 3.5–5.0)
Alkaline Phosphatase: 62 U/L (ref 38–126)
Anion gap: 12 (ref 5–15)
BUN: 27 mg/dL — ABNORMAL HIGH (ref 8–23)
CO2: 25 mmol/L (ref 22–32)
Calcium: 8.2 mg/dL — ABNORMAL LOW (ref 8.9–10.3)
Chloride: 100 mmol/L (ref 98–111)
Creatinine, Ser: 1.58 mg/dL — ABNORMAL HIGH (ref 0.61–1.24)
GFR, Estimated: 43 mL/min — ABNORMAL LOW
Glucose, Bld: 194 mg/dL — ABNORMAL HIGH (ref 70–99)
Potassium: 3.1 mmol/L — ABNORMAL LOW (ref 3.5–5.1)
Sodium: 137 mmol/L (ref 135–145)
Total Bilirubin: 0.7 mg/dL (ref 0.0–1.2)
Total Protein: 5.6 g/dL — ABNORMAL LOW (ref 6.5–8.1)

## 2024-10-18 LAB — URINALYSIS, W/ REFLEX TO CULTURE (INFECTION SUSPECTED)
Bilirubin Urine: NEGATIVE
Glucose, UA: NEGATIVE mg/dL
Hgb urine dipstick: NEGATIVE
Ketones, ur: NEGATIVE mg/dL
Leukocytes,Ua: NEGATIVE
Nitrite: NEGATIVE
Protein, ur: 30 mg/dL — AB
Specific Gravity, Urine: >1.046 — ABNORMAL HIGH (ref 1.005–1.030)
pH: 5 (ref 5.0–8.0)

## 2024-10-18 LAB — MAGNESIUM: Magnesium: 2.2 mg/dL (ref 1.7–2.4)

## 2024-10-18 LAB — PROTIME-INR
INR: 1.2 (ref 0.8–1.2)
Prothrombin Time: 15.9 s — ABNORMAL HIGH (ref 11.4–15.2)

## 2024-10-18 LAB — CBG MONITORING, ED: Glucose-Capillary: 153 mg/dL — ABNORMAL HIGH (ref 70–99)

## 2024-10-18 MED ORDER — POTASSIUM CHLORIDE 10 MEQ/100ML IV SOLN
10.0000 meq | Freq: Once | INTRAVENOUS | Status: AC
  Administered 2024-10-18: 10 meq via INTRAVENOUS
  Filled 2024-10-18: qty 100

## 2024-10-18 MED ORDER — LACTATED RINGERS IV SOLN: INTRAVENOUS | Status: DC

## 2024-10-18 MED ORDER — ALBUMIN HUMAN 25 % IV SOLN
12.5000 g | Freq: Once | INTRAVENOUS | Status: AC
  Administered 2024-10-18: 12.5 g via INTRAVENOUS
  Filled 2024-10-18: qty 50

## 2024-10-18 MED ORDER — ACETAMINOPHEN 650 MG RE SUPP: 650.0000 mg | Freq: Four times a day (QID) | RECTAL | Status: AC | PRN

## 2024-10-18 MED ORDER — MIDODRINE HCL 5 MG PO TABS: 5.0000 mg | ORAL_TABLET | Freq: Three times a day (TID) | ORAL | Status: AC

## 2024-10-18 MED ORDER — ACETAMINOPHEN 325 MG PO TABS
650.0000 mg | ORAL_TABLET | Freq: Four times a day (QID) | ORAL | Status: AC | PRN
  Administered 2024-10-18: 650 mg via ORAL
  Filled 2024-10-18: qty 2

## 2024-10-18 MED ORDER — SODIUM CHLORIDE 0.9 % IV BOLUS
1000.0000 mL | Freq: Once | INTRAVENOUS | Status: AC
  Administered 2024-10-18: 1000 mL via INTRAVENOUS

## 2024-10-18 MED ORDER — ENSURE PLUS HIGH PROTEIN PO LIQD: 237.0000 mL | Freq: Two times a day (BID) | ORAL | Status: AC

## 2024-10-18 MED ORDER — MAGNESIUM SULFATE 2 GM/50ML IV SOLN
2.0000 g | Freq: Once | INTRAVENOUS | Status: AC
  Administered 2024-10-18: 2 g via INTRAVENOUS
  Filled 2024-10-18: qty 50

## 2024-10-18 MED ORDER — LACTATED RINGERS IV BOLUS
1000.0000 mL | Freq: Once | INTRAVENOUS | Status: AC
  Administered 2024-10-18: 1000 mL via INTRAVENOUS

## 2024-10-18 MED ORDER — ONDANSETRON HCL 4 MG/2ML IJ SOLN: 4.0000 mg | Freq: Four times a day (QID) | INTRAMUSCULAR | Status: AC | PRN

## 2024-10-18 MED ORDER — SODIUM CHLORIDE 0.9 % IV SOLN: INTRAVENOUS | Status: AC

## 2024-10-18 MED ORDER — IOHEXOL 300 MG/ML  SOLN
75.0000 mL | Freq: Once | INTRAMUSCULAR | Status: AC | PRN
  Administered 2024-10-18: 75 mL via INTRAVENOUS

## 2024-10-18 MED ORDER — POTASSIUM CHLORIDE 10 MEQ/100ML IV SOLN
10.0000 meq | INTRAVENOUS | Status: AC
  Administered 2024-10-18: 10 meq via INTRAVENOUS
  Filled 2024-10-18: qty 100

## 2024-10-18 MED ORDER — METOPROLOL TARTRATE 5 MG/5ML IV SOLN
2.5000 mg | Freq: Once | INTRAVENOUS | Status: AC
  Administered 2024-10-18: 2.5 mg via INTRAVENOUS
  Filled 2024-10-18: qty 5

## 2024-10-18 MED ORDER — MIDODRINE HCL 5 MG PO TABS
2.5000 mg | ORAL_TABLET | Freq: Once | ORAL | Status: AC
  Administered 2024-10-18: 2.5 mg via ORAL
  Filled 2024-10-18: qty 1

## 2024-10-18 MED ORDER — SODIUM CHLORIDE 0.9% FLUSH: 3.0000 mL | Freq: Two times a day (BID) | INTRAVENOUS | Status: AC

## 2024-10-18 MED ORDER — ONDANSETRON HCL 4 MG PO TABS: 4.0000 mg | ORAL_TABLET | Freq: Four times a day (QID) | ORAL | Status: AC | PRN

## 2024-10-18 NOTE — ED Notes (Signed)
 Patient transported to CT

## 2024-10-18 NOTE — Telephone Encounter (Signed)
 Returned call to patient's spouse Edward [name] who is with patient at home. Patient gives name and DOB as well as permission to speak to Edward [name] and friend Devaughn who is an EMT.  Edward [name] reports that patient fell today and has a bump on his head. Devaughn reports that they have tried to help him stand but he gets flushed and doesn't feel well. Losartan  and metoprolol  tartrate were stopped due to hypotension at last visit with Dr. Kriste on 10/03/24. Edward [name] reports patient did received IV fluids at his recent oncology appointment but his hypotension and symptoms have not improved.  Edward [name] also reports that patient has had a re-currence of c.diff that has made him very weak. She says his BP is 98/54 today but he is unable to stand after several attempts with assistance. Patient has appointment with Dr. Kriste today and Edward [name] states she would rather come here if she can get patient up than go to ED.   Reviewed this information with Dr. Kriste who advised that patient call EMS for transport to ED so patient can be evaluated for fall, c.diff, and fluid status. Edward [name] and Devaughn verbalize understanding and agree to plan.

## 2024-10-18 NOTE — Telephone Encounter (Signed)
 Pt c/o BP issue: STAT if pt c/o blurred vision, one-sided weakness or slurred speech.  STAT if BP is GREATER than 180/120 TODAY.  STAT if BP is LESS than 90/60 and SYMPTOMATIC TODAY  1. What is your BP concern? Low BP  2. Have you taken any BP medication today?Yes  3. What are your last 5 BP readings?98/54  4. Are you having any other symptoms (ex. Dizziness, headache, blurred vision, passed out)? No   Patient spouse called to notify Dr Kriste about low BP before appt today

## 2024-10-18 NOTE — ED Provider Notes (Signed)
 " [place] EMERGENCY DEPARTMENT AT [facility] Provider Note  CSN: [id number] Arrival date & time: 10/18/24 1245  Chief Complaint(s) Fall, Hypotension, and Near Syncope  HPI Edward [name] is a 84 y.o. male who is here today after a near syncopal episode.  Patient has a past medical history significant for lung cancer, currently receiving chemotherapy.  Patient has a history of recurrent C. difficile.  Patient reports that he started have some diarrhea yesterday, getting worsening and that began to feel lightheaded and fell to the ground.  He did not have any chest pain, he did strike the right side of his head.  He is not on any blood thinners.   Past Medical History Past Medical History:  Diagnosis Date   BPH (benign prostatic hyperplasia)    Cancer (HCC)    Cataract    surgical removal bilateral   CKD (chronic kidney disease) stage 3, GFR 30-59 ml/min (HCC)    DM (diabetes mellitus), type 2 (HCC)    ED (erectile dysfunction)    GERD (gastroesophageal reflux disease)    History of adenomatous polyp of colon 11/02/2017   Tubular adenoma 2013; recommended 5 year follow up   History of kidney stones    Hyperlipidemia    Hypertension    IBS (irritable bowel syndrome)    Kidney stones 02/11/2020   LBBB (left bundle branch block)    Lung nodule    Personal history of kidney stones    Vitamin D  deficiency    Patient Active Problem List   Diagnosis Date Noted   Nausea with vomiting 10/03/2024   Sepsis (HCC) 09/07/2024   C. difficile colitis 09/07/2024   Malignant neoplasm of lung (HCC) 09/07/2024   Neutropenic 09/06/2024   Chemotherapy induced neutropenia 09/04/2024   Obstructive pneumonia 08/15/2024   Acute hypoxic respiratory failure (HCC) 08/15/2024   Primary malignant neuroendocrine neoplasm of lung (HCC) 08/05/2024   Lung nodule 07/25/2024   Rib pain on right side 06/27/2024   Muscle spasm 06/27/2024   Right lower lobe lung mass 06/27/2024   Allergic  rhinoconjunctivitis 06/27/2024   Type 2 diabetes with circulatory disorder causing erectile dysfunction (HCC) 06/27/2024   Encounter for general adult medical examination with abnormal findings 03/12/2024   Erectile dysfunction due to type 2 diabetes mellitus (HCC) 04/09/2021   Former smoker 09/02/2018   FHx: heart disease 09/02/2018   CKD stage 3 due to type 2 diabetes mellitus (HCC) 11/02/2017   Type 2 diabetes mellitus with stage 3 chronic kidney disease, without long-term current use of insulin  (HCC) 11/01/2014   Overweight (BMI 25.0-29.9) 07/25/2014   Hyperlipidemia associated with type 2 diabetes mellitus (HCC)    Gastroesophageal reflux disease    Essential hypertension    Vitamin D  deficiency    BPH with obstruction/lower urinary tract symptoms    Home Medication(s) Prior to Admission medications  Medication Sig Start Date End Date Taking? Authorizing Provider  acetaminophen  (TYLENOL ) 325 MG tablet Take 2 tablets (650 mg total) by mouth every 6 (six) hours as needed for mild pain (pain score 1-3) or fever (or Fever >/= 101). 09/16/24   Will Almarie MATSU, MD  albuterol  (ACCUNEB ) 1.25 MG/3ML nebulizer solution Inhale 3 mL (1.25 mg total) by nebulization every 6 (six) hours as needed for wheezing. Patient taking differently: Take 1 ampule by nebulization See admin instructions. Nebulize 1.25 mg (1 ampule) and inhale into he lungs two times a day and an additional 2 times a day as needed for shortness of  breath or wheezing 08/14/24   Alghanim, Paula, MD  albuterol  (VENTOLIN  HFA) 108 (90 Base) MCG/ACT inhaler Inhale 2 puffs into the lungs every 4 (four) hours as needed for wheezing or shortness of breath. 08/19/24   Jadine Toribio SQUIBB, MD  alum & mag hydroxide-simeth (MAALOX/MYLANTA) 200-200-20 MG/5ML suspension Take 15 mLs by mouth every 4 (four) hours as needed for indigestion or heartburn. 09/16/24   Will Almarie MATSU, MD  atorvastatin  (LIPITOR) 40 MG tablet Take 1 tablet (40 mg total)  by mouth every evening for cholesterol 10/01/24   Henson, Vickie L, NP-C  Cholecalciferol (VITAMIN D3) 125 MCG (5000 UT) CAPS Take 5,000 Units by mouth daily.    [provider]  CINNAMON PO Take 1-2 capsules by mouth daily.    [provider]  CLARITIN 10 MG tablet Take 10 mg by mouth daily.    [provider]  Cyanocobalamin (VITAMIN B 12 PO) Take 1 tablet by mouth daily.    [provider]  famotidine  (PEPCID ) 40 MG tablet Take 1 tablet (40 mg total) by mouth every evening to prevent heartburn and indigestion. Patient taking differently: Take 40 mg by mouth See admin instructions. Take 40 mg by mouth in the evening as needed for reflux or indigestion 06/27/24   Alvia Corean CROME, FNP  fluconazole  (DIFLUCAN ) 40 MG/ML suspension Take 5 mLs (200 mg total) by mouth daily for 14 days. 10/11/24 10/25/24  Vu, Constance T, MD  glucose blood (ONETOUCH VERIO) test strip USE TO TEST BLOOD SUGAR ONCE DAILY 09/25/24   Henson, Vickie L, NP-C  glucose blood test strip Use 1 strip to check glucose once daily. 06/27/24   Alvia Corean CROME, FNP  Lancets [facility] ULTRASOFT) lancets Use as instructed 09/25/24   Lendia Nordmann L, NP-C  lidocaine  (XYLOCAINE ) 2 % solution Use as directed 15 mLs in the mouth or throat as needed for mouth pain. Swallow 20-30 minutes prior to meals and at bedtime 10/02/24   Shannon Agent, MD  losartan  (COZAAR ) 50 MG tablet Take 1 tablet (50 mg total) by mouth daily for blood pressure. 08/07/24   Henson, Vickie L, NP-C  metoprolol  tartrate (LOPRESSOR ) 25 MG tablet Take 0.5 tablets (12.5 mg total) by mouth daily at 2 am. 09/16/24   Will Almarie MATSU, MD  OMEGA-3 FATTY ACIDS PO Take 1 capsule by mouth 3 (three) times a week.    [provider]  potassium chloride  SA (KLOR-CON  M) 20 MEQ tablet Take 1 tablet (20 mEq total) by mouth 2 (two) times daily. 09/23/24   Heilingoetter, Cassandra L, PA-C  sucralfate  (CARAFATE ) 1 g tablet Take 1 tablet (1 g  total) by mouth 4 (four) times daily -  with meals and at bedtime. 09/23/24   Heilingoetter, Cassandra L, PA-C  SYSTANE COMPLETE PF 0.6 % SOLN Place 1 drop into both eyes 3 (three) times daily as needed (for dryness or irritation).    [provider]  tamsulosin  (FLOMAX ) 0.4 MG CAPS capsule Take 1 capsule (0.4 mg total) by mouth every evening for prostate. 06/27/24   Alvia Corean CROME, FNP  zinc gluconate 50 MG tablet Take 50 mg by mouth daily.    [provider]  Past Surgical History Past Surgical History:  Procedure Laterality Date   CATARACT EXTRACTION Right 2015   CATARACT EXTRACTION W/ INTRAOCULAR LENS IMPLANT  2013   left   COLONOSCOPY  2013   TA   ENDOBRONCHIAL ULTRASOUND Bilateral 07/30/2024   Procedure: ENDOBRONCHIAL ULTRASOUND (EBUS);  Surgeon: Isadora Hose, MD;  Location: [facility] ORS;  Service: Pulmonary;  Laterality: Bilateral;   EXTRACORPOREAL SHOCK WAVE LITHOTRIPSY Left 12/11/2017   Procedure: LEFT EXTRACORPOREAL SHOCK WAVE LITHOTRIPSY (ESWL);  Surgeon: Carolee Sherwood JONETTA DOUGLAS, MD;  Location: [facility] ORS;  Service: Urology;  Laterality: Left;   KIDNEY STONE SURGERY     POLYPECTOMY     VIDEO BRONCHOSCOPY WITH ENDOBRONCHIAL ULTRASOUND Bilateral 07/18/2024   Procedure: BRONCHOSCOPY, WITH EBUS;  Surgeon: Catherine Cools, MD;  Location: [facility] END[facility];  Service: Pulmonary;  Laterality: Bilateral;   Family History Family History  Problem Relation Age of Onset   Diabetes Sister    Hypertension Sister    Heart disease Mother    Diabetes Sister    Hypertension Sister    Colon cancer Neg Hx    Colon polyps Neg Hx    Esophageal cancer Neg Hx    Rectal cancer Neg Hx    Stomach cancer Neg Hx     Social History Social History[1] Allergies Bystolic [nebivolol hcl]  Review of Systems Review of Systems  Physical Exam Vital Signs  I have  reviewed the triage vital signs BP 97/74 (BP Location: Right Arm)   Pulse 68   Temp 98.4 F (36.9 C) (Oral)   Resp 20   Ht 5' 9 (1.753 m)   Wt 60.3 kg   SpO2 97%   BMI 19.64 kg/m   Physical Exam Vitals and nursing note reviewed.  Constitutional:      Appearance: He is not toxic-appearing.     Comments: Cachectic  HENT:     Head: Normocephalic.     Comments: Hematoma to right temple    Nose: Nose normal.  Eyes:     Pupils: Pupils are equal, round, and reactive to light.  Cardiovascular:     Rate and Rhythm: Rhythm irregular.  Pulmonary:     Effort: Pulmonary effort is normal.  Abdominal:     General: Abdomen is flat. There is no distension.     Palpations: Abdomen is soft.     Tenderness: There is no abdominal tenderness. There is no guarding.  Musculoskeletal:        General: Normal range of motion.     Cervical back: Normal range of motion.  Skin:    General: Skin is warm.  Neurological:     General: No focal deficit present.     Mental Status: He is alert.     ED Results and Treatments Labs (all labs ordered are listed, but only abnormal results are displayed) Labs Reviewed  COMPREHENSIVE METABOLIC PANEL WITH GFR - Abnormal; Notable for the following components:      Result Value   Potassium 3.1 (*)    Glucose, Bld 194 (*)    BUN 27 (*)    Creatinine, Ser 1.58 (*)    Calcium  8.2 (*)    Total Protein 5.6 (*)    Albumin  2.8 (*)    GFR, Estimated 43 (*)    All other components within normal limits  CBC - Abnormal; Notable for the following components:   RBC 3.20 (*)    Hemoglobin 9.2 (*)    HCT 29.1 (*)    RDW 17.7 (*)  All other components within normal limits  PROTIME-INR - Abnormal; Notable for the following components:   Prothrombin Time 15.9 (*)    All other components within normal limits  CBG MONITORING, ED - Abnormal; Notable for the following components:   Glucose-Capillary 153 (*)    All other components within normal limits  I-STAT CG4  LACTIC ACID, ED - Abnormal; Notable for the following components:   Lactic Acid, Venous 2.8 (*)    All other components within normal limits  CULTURE, BLOOD (ROUTINE X 2)  CULTURE, BLOOD (ROUTINE X 2)  URINALYSIS, W/ REFLEX TO CULTURE (INFECTION SUSPECTED)  MAGNESIUM   I-STAT CG4 LACTIC ACID, ED                                                                                                                          Radiology CT CHEST ABDOMEN PELVIS W CONTRAST Result Date: 10/18/2024 CLINICAL DATA:  Polytrauma, blunt.  Fall. * Tracking Code: BO * EXAM: CT CHEST, ABDOMEN, AND PELVIS WITH CONTRAST TECHNIQUE: Multidetector CT imaging of the chest, abdomen and pelvis was performed following the standard protocol during bolus administration of intravenous contrast. RADIATION DOSE REDUCTION: This exam was performed according to the departmental dose-optimization program which includes automated exposure control, adjustment of the mA and/or kV according to patient size and/or use of iterative reconstruction technique. CONTRAST:  75mL OMNIPAQUE  IOHEXOL  300 MG/ML  SOLN COMPARISON:  CT angiography chest from 09/06/2024 and CT scan chest, abdomen and pelvis from 07/02/2024. FINDINGS: CT CHEST FINDINGS Cardiovascular: Normal cardiac size. Physiological pericardial effusion. No aortic aneurysm. There are minimal peripheral atherosclerotic vascular calcifications of thoracic aorta and its major branches. Mediastinum/Nodes: Visualized thyroid  gland appears grossly unremarkable. No solid / cystic mediastinal masses. The esophagus is nondistended precluding optimal assessment. There is subtle ill-defined soft tissue at the left lower paratracheal location (series 3, image 27), essentially similar to the prior study. No new axillary, mediastinal or hilar lymphadenopathy by size criteria. Lungs/Pleura: The trachea and left central bronchial tree is patent. There is complete occlusion of the middle lobe bronchus, from right  inferior hilar mass described below. Redemonstration of a lobulated heterogeneous hypoattenuating approximately 4.9 x 5.3 cm right inferior hilar mass, essentially similar to the prior study. There is small-to-moderate right pleural effusion, grossly similar to the prior study from 09/06/2024. No left pleural effusion. There are multi segmental atelectatic changes in the right lower lobe, also grossly unchanged. There are dependent changes in left lung. No left lung mass, consolidation, pleural effusion or pneumothorax. No suspicious lung nodules on either side. Musculoskeletal: The visualized soft tissues of the chest wall are grossly unremarkable. Redemonstration of well-circumscribed lytic lesion in the right scapula measuring up to 1.0 x 1.3 cm, of indeterminate origin but unchanged since the prior study. No suspicious osseous lesions. There are mild to moderate multilevel degenerative changes in the visualized spine. CT ABDOMEN PELVIS FINDINGS Hepatobiliary: The liver is normal in size. Non-cirrhotic configuration. There are at least 2, subcentimeter  sized hypoattenuating structures in the liver, which are too small to adequately characterize but appears unchanged since the prior studies and favored benign in etiology. No new suspicious liver lesion. No intrahepatic or extrahepatic bile duct dilation. No calcified gallstones. Normal gallbladder wall thickness. No pericholecystic inflammatory changes. Pancreas: Unremarkable. No pancreatic ductal dilatation or surrounding inflammatory changes. Spleen: Within normal limits. No focal lesion. Adrenals/Urinary Tract: Adrenal glands are unremarkable. Persistent bilateral fetal lobulations noted. There are multiple sinus cysts in bilateral kidneys, right more than left. There is a nearly completely exophytic simple cortical cyst arising from the right kidney lower pole, posterolaterally measuring up to 2.4 x 3.7 cm. No suspicious renal mass. Focal areas of scarring  noted in bilateral kidneys. No nephroureterolithiasis or obstructive uropathy. Urinary bladder is partially distended and appears within normal limits. There are 2, dependent, calculi with largest measuring up to 6 x 8 mm. No focal bladder mass or perivesical fat stranding. Stomach/Bowel: No disproportionate dilation of the small or large bowel loops. Unremarkable appendix. There is mild-to-moderate circumferential thickening of the entire colon with associated at least mild pericolonic fat stranding, compatible with pancolitis. There is tiny hiatal hernia. Vascular/Lymphatic: There is small amount of ascites in the dependent pelvis, likely reactive. No walled-off abscess. No pneumoperitoneum. No abdominal or pelvic lymphadenopathy, by size criteria. No aneurysmal dilation of the major abdominal arteries. There are mild peripheral atherosclerotic vascular calcifications of the aorta and its major branches. Reproductive: Enlarged prostate. Symmetric seminal vesicles. Other: The visualized soft tissues and abdominal wall are unremarkable. Musculoskeletal: No suspicious osseous lesions. There are mild - moderate multilevel degenerative changes in the visualized spine. Bilateral L5 spondylolysis noted with resultant grade 1 anterolisthesis of L5 over S1. IMPRESSION: 1. No acute traumatic injury to the chest, abdomen or pelvis. 2. Redemonstration of right inferior hilar mass with complete occlusion of the middle lobe bronchus and atelectatic changes in the right lower lobe. There is small-to-moderate right pleural effusion, grossly similar to the prior study. 3. There is mild-to-moderate circumferential thickening of the entire colon with associated at least mild pericolonic fat stranding, compatible with pancolitis. Findings may be infective/inflammatory in etiology. Ischemic etiology is less likely considering the distribution pattern. 4. Multiple other nonacute observations (such as stable left paratracheal soft  tissue/lymph node, at least 2 subcentimeter sized hypoattenuating structures in the liver, bilateral renal sinus cysts, right renal exophytic cortical cyst, 2 dependent urinary bladder calculi measuring up to 8 mm, small ascites-likely reactive, enlarged prostate gland, bilateral L5 spondylolysis, etc.), As described above. Aortic Atherosclerosis (ICD10-I70.0). Electronically Signed   By: Ree Molt M.D.   On: 10/18/2024 15:30   CT Cervical Spine Wo Contrast Result Date: 10/18/2024 EXAM: CT CERVICAL SPINE WITHOUT CONTRAST 10/18/2024 02:48:45 PM TECHNIQUE: CT of the cervical spine was performed without the administration of intravenous contrast. Multiplanar reformatted images are provided for review. Automated exposure control, iterative reconstruction, and/or weight based adjustment of the mA/kV was utilized to reduce the radiation dose to as low as reasonably achievable. COMPARISON: None available. CLINICAL HISTORY: Ataxia, cervical trauma. FINDINGS: BONES AND ALIGNMENT: No acute fracture or traumatic malalignment of the cervical spine. DEGENERATIVE CHANGES: There are multilevel degenerative changes. There is disc osteophyte complex at C3-C4 with spinal canal stenosis. SOFT TISSUES: No prevertebral soft tissue swelling. There is a 1.9 x 1.2 cm hypoattenuating/ cystic focus within the right aspect of the vallecula, recommend direct inspection. LUNGS AND PLEURA: Partially visualized right pleural effusion with subjacent focal atelectasis. IMPRESSION: 1. No acute fracture or  traumatic malalignment of the cervical spine. 2. Partially visualized right pleural effusion with subjacent atelectasis. 3. Hypoattenuating/cystic focus in the right vallecula measuring 1.9 x 1.2 cm, recommend direct inspection. Electronically signed by: Prentice Spade MD 10/18/2024 03:09 PM EST RP Workstation: [id number]   CT Head Wo Contrast Result Date: 10/18/2024 EXAM: CT HEAD WITHOUT CONTRAST 10/18/2024 02:48:45 PM TECHNIQUE: CT of  the head was performed without the administration of intravenous contrast. Automated exposure control, iterative reconstruction, and/or weight based adjustment of the mA/kV was utilized to reduce the radiation dose to as low as reasonably achievable. COMPARISON: MRI brain 07/22/2024. CLINICAL HISTORY: Ataxia, head trauma. FINDINGS: BRAIN AND VENTRICLES: No acute hemorrhage. No evidence of acute infarct. No hydrocephalus. No extra-axial collection. No mass effect or midline shift. There is generalized parenchymal volume loss. There are moderate scattered white matter hypodensities which are nonspecific but most commonly represent chronic microvascular ischemic changes. SINUSES: No acute abnormality. SOFT TISSUES AND SKULL: No acute soft tissue abnormality. No skull fracture. IMPRESSION: 1. No acute intracranial abnormality. Electronically signed by: Prentice Spade MD 10/18/2024 02:59 PM EST RP Workstation: [id number]   DG Chest Port 1 View Result Date: 10/18/2024 EXAM: 1 VIEW(S) XRAY OF THE CHEST 10/18/2024 01:33:00 PM COMPARISON: 09/10/2024 CLINICAL HISTORY: Questionable sepsis; evaluate for abnormality. FINDINGS: LUNGS AND PLEURA: Right pleural effusion. Associated ill-defined opacity at right lung base per stable. Mild improvement in previous pulmonary vascular indistinctness and previous interstitial accentuation. No pneumothorax. HEART AND MEDIASTINUM: Stable cardiomegaly. Atheromatous vascular calcification of the aortic arch. BONES AND SOFT TISSUES: Thoracic spondylosis. No acute osseous abnormality. IMPRESSION: 1. Stable right pleural effusion with stable ill-defined right basilar opacity. 2. Mild improvement in pulmonary vascular indistinctness and interstitial accentuation. 3. Stable cardiomegaly. 4. Thoracic spondylosis. Electronically signed by: Ryan Salvage MD 10/18/2024 02:40 PM EST RP Workstation: [id number]    Pertinent labs & imaging results that were available during my care of the patient  were reviewed by me and considered in my medical decision making (see MDM for details).  Medications Ordered in ED Medications  metoprolol  tartrate (LOPRESSOR ) injection 2.5 mg (has no administration in time range)  potassium chloride  10 mEq in 100 mL IVPB (has no administration in time range)  magnesium  sulfate IVPB 2 g 50 mL (has no administration in time range)  lactated ringers  bolus 1,000 mL (has no administration in time range)  sodium chloride  0.9 % bolus 1,000 mL (1,000 mLs Intravenous New Bag/Given 10/18/24 1339)  albumin  human 25 % solution 12.5 g (0 g Intravenous Stopped 10/18/24 1520)  iohexol  (OMNIPAQUE ) 300 MG/ML solution 75 mL (75 mLs Intravenous Contrast Given 10/18/24 1429)                                                                                                                                     Procedures Procedures  (including critical care time)  Medical Decision Making / ED Course   This patient presents to  the ED for concern of near syncope, this involves an extensive number of treatment options, and is a complaint that carries with it a high risk of complications and morbidity.  The differential diagnosis includes dehydration, atrial fibrillation, ICH, intrathoracic injury, intra-abdominal injury,, C. difficile.  MDM: Patient with irregular heart rate, has atrial fibrillation on his EKG as well as multiple PVCs.  Blood pressure irregular, likely corresponding to variations in heart rate.  Ordered patient's IV fluids.  With his blood pressure, do have some concern about adding rate control agents at this time.  Will significant improvement in his pressure with fluid bolus, consider metoprolol .  Will obtain imaging of the patient's head, chest abdomen pelvis.  I think sepsis is less likely in this patient, but will draw blood cultures, lactic acid.  He is afebrile, however with his cancer history, current chemotherapy use, he is immunocompromised.  Reassessment 4:10  PM-patient with some electrolyte abnormalities, have began to replete his potassium, magnesium .  Frequent PVCs may be secondary to stopping his metoprolol  recently versus electrolyte abnormalities.  Give the patient a small dose of IV metoprolol  with some improvement in his rate.  Patient also responded well to fluids and some IV albumin .  Urine appears quite concentrated.  Believe the patient's soft blood pressures are likely secondary to decreased p.o. intake and poor nutrition.  Will admit patient to hospitalist for dehydration, near syncope, hypokalemia, new onset atrial fibrillation, frequent PVCs.    Additional history obtained: -Additional history obtained from family at bedside -External records from outside source obtained and reviewed including: Chart review including previous notes, labs, imaging, consultation notes   Lab Tests: -I ordered, reviewed, and interpreted labs.   The pertinent results include:   Labs Reviewed  COMPREHENSIVE METABOLIC PANEL WITH GFR - Abnormal; Notable for the following components:      Result Value   Potassium 3.1 (*)    Glucose, Bld 194 (*)    BUN 27 (*)    Creatinine, Ser 1.58 (*)    Calcium  8.2 (*)    Total Protein 5.6 (*)    Albumin  2.8 (*)    GFR, Estimated 43 (*)    All other components within normal limits  CBC - Abnormal; Notable for the following components:   RBC 3.20 (*)    Hemoglobin 9.2 (*)    HCT 29.1 (*)    RDW 17.7 (*)    All other components within normal limits  PROTIME-INR - Abnormal; Notable for the following components:   Prothrombin Time 15.9 (*)    All other components within normal limits  CBG MONITORING, ED - Abnormal; Notable for the following components:   Glucose-Capillary 153 (*)    All other components within normal limits  I-STAT CG4 LACTIC ACID, ED - Abnormal; Notable for the following components:   Lactic Acid, Venous 2.8 (*)    All other components within normal limits  CULTURE, BLOOD (ROUTINE X 2)   CULTURE, BLOOD (ROUTINE X 2)  URINALYSIS, W/ REFLEX TO CULTURE (INFECTION SUSPECTED)  MAGNESIUM   I-STAT CG4 LACTIC ACID, ED      EKG atrial fibrillation, frequent PVCs.  EKG Interpretation Date/Time:    Ventricular Rate:    PR Interval:    QRS Duration:    QT Interval:    QTC Calculation:   R Axis:      Text Interpretation:           Imaging Studies ordered: I ordered imaging studies including CT imaging of the head, abdomen  pelvis I independently visualized and interpreted imaging. I agree with the radiologist interpretation   Medicines ordered and prescription drug management: Meds ordered this encounter  Medications   sodium chloride  0.9 % bolus 1,000 mL   albumin  human 25 % solution 12.5 g   iohexol  (OMNIPAQUE ) 300 MG/ML solution 75 mL   metoprolol  tartrate (LOPRESSOR ) injection 2.5 mg   potassium chloride  10 mEq in 100 mL IVPB   magnesium  sulfate IVPB 2 g 50 mL   lactated ringers  bolus 1,000 mL    -I have reviewed the patients home medicines and have made adjustments as needed   Cardiac Monitoring: The patient was maintained on a cardiac monitor.  I personally viewed and interpreted the cardiac monitored which showed an underlying rhythm of: Atrial fibrillation with frequent PVCs  Social Determinants of Health:  Factors impacting patients care include: Medical comorbidities including cancer, currently undergoing chemotherapy   Reevaluation: After the interventions noted above, I reevaluated the patient and found that they have :improved  Co morbidities that complicate the patient evaluation  Past Medical History:  Diagnosis Date   BPH (benign prostatic hyperplasia)    Cancer (HCC)    Cataract    surgical removal bilateral   CKD (chronic kidney disease) stage 3, GFR 30-59 ml/min (HCC)    DM (diabetes mellitus), type 2 (HCC)    ED (erectile dysfunction)    GERD (gastroesophageal reflux disease)    History of adenomatous polyp of colon 11/02/2017    Tubular adenoma 2013; recommended 5 year follow up   History of kidney stones    Hyperlipidemia    Hypertension    IBS (irritable bowel syndrome)    Kidney stones 02/11/2020   LBBB (left bundle branch block)    Lung nodule    Personal history of kidney stones    Vitamin D  deficiency       Final Clinical Impression(s) / ED Diagnoses Final diagnoses:  Syncope and collapse  Dehydration  Symptomatic PVCs     @PCDICTATION @     [1]  Social History Tobacco Use   Smoking status: Former    Current packs/day: 0.00    Average packs/day: 0.5 packs/day for 10.0 years (5.0 ttl pk-yrs)    Types: Cigarettes    Start date: 09/12/1957    Quit date: 09/13/1967    Years since quitting: 57.1    Passive exposure: Past   Smokeless tobacco: Never  Vaping Use   Vaping status: Never Used  Substance Use Topics   Alcohol  use: No   Drug use: No     Mannie Pac T, DO 10/18/24 1615  "

## 2024-10-18 NOTE — H&P (Addendum)
 " History and Physical    Patient: Edward [name] FMW:[id number] DOB: [birth year] DOA: 10/18/2024 DOS: the patient was seen and examined on 10/18/2024 PCP: [name], NP-C  Patient coming from: Home  Chief Complaint:  Chief Complaint  Patient presents with   Fall   Hypotension   Near Syncope   HPI: Edward [name] is a 84 y.o. male with medical history significant for neuroendocrine lung cancer, CKD stage III with baseline creatinine of, type 2 diabetes mellitus which is no longer active given his malnutrition, nonsustained SVT and BPH. He is receiving chemotherapy for his cancer.  Patient says he is due for chemotherapy next week he thinks.  He was discharged from the hospital approximately 1 month ago after being treated for obstructive pneumonia, neutropenia, and C. difficile colitis.  The patient has been doing poorly at home.  He continues to have loose stool multiple times a day and through the night.  He is very weak.  He has a very poor appetite and is continuing to lose weight.  He denied pain or significant shortness of breath.  He tells me that this morning he was extremely dizzy.  He is often dizzy in the mornings but this morning it was worse than usual.  He was getting ready to go to a doctor's appointment but as he was leaving the house he his dizziness got much worse.  He turned to sit down in his chair and missed the chair and ended up on the floor.  He did hit his head and his shoulder. The patient was on his way to see his cardiologist.  At his last visit the patient's metoprolol  and losartan  were stopped because his blood pressure was low.  The patient has received IV fluids at his oncology appointments as well because of his low blood pressure.  On arrival in the emergency department the patient's blood pressure was 88/54.  The patient received IV fluids.  His heart rate was in the the 1 teens to 130s.  He received albumin  then 1 small dose of IV beta-blocker.  That did  work to control his heart rate.  Patient denies chest pain or palpitations or even shortness of breath. He is not dizzy while in the bed.  He did have a CT of his C-spine, head and neck, and chest abdomen and pelvis to rule out traumatic injury from the fall.  No traumatic injury was found. He will be admitted to the hospitalist service for further management.   Review of Systems: As mentioned in the history of present illness. All other systems reviewed and are negative. Past Medical History:  Diagnosis Date   BPH (benign prostatic hyperplasia)    Cancer (HCC)    Cataract    surgical removal bilateral   CKD (chronic kidney disease) stage 3, GFR 30-59 ml/min (HCC)    DM (diabetes mellitus), type 2 (HCC)    ED (erectile dysfunction)    GERD (gastroesophageal reflux disease)    History of adenomatous polyp of colon 11/02/2017   Tubular adenoma 2013; recommended 5 year follow up   History of kidney stones    Hyperlipidemia    Hypertension    IBS (irritable bowel syndrome)    Kidney stones 02/11/2020   LBBB (left bundle branch block)    Lung nodule    Personal history of kidney stones    Vitamin D  deficiency    Past Surgical History:  Procedure Laterality Date   CATARACT EXTRACTION Right 2015  CATARACT EXTRACTION W/ INTRAOCULAR LENS IMPLANT  2013   left   COLONOSCOPY  2013   TA   ENDOBRONCHIAL ULTRASOUND Bilateral 07/30/2024   Procedure: ENDOBRONCHIAL ULTRASOUND (EBUS);  Surgeon: Isadora Hose, MD;  Location: [facility] ORS;  Service: Pulmonary;  Laterality: Bilateral;   EXTRACORPOREAL SHOCK WAVE LITHOTRIPSY Left 12/11/2017   Procedure: LEFT EXTRACORPOREAL SHOCK WAVE LITHOTRIPSY (ESWL);  Surgeon: Carolee Sherwood JONETTA DOUGLAS, MD;  Location: [facility];  Service: Urology;  Laterality: Left;   KIDNEY STONE SURGERY     POLYPECTOMY     VIDEO BRONCHOSCOPY WITH ENDOBRONCHIAL ULTRASOUND Bilateral 07/18/2024   Procedure: BRONCHOSCOPY, WITH EBUS;  Surgeon: Catherine Cools, MD;  Location: [facility];   Service: Pulmonary;  Laterality: Bilateral;   Social History:  reports that he quit smoking about 57 years ago. His smoking use included cigarettes. He started smoking about 67 years ago. He has a 5 pack-year smoking history. He has been exposed to tobacco smoke. He has never used smokeless tobacco. He reports that he does not drink alcohol  and does not use drugs.  Allergies[1]  Family History  Problem Relation Age of Onset   Diabetes Sister    Hypertension Sister    Heart disease Mother    Diabetes Sister    Hypertension Sister    Colon cancer Neg Hx    Colon polyps Neg Hx    Esophageal cancer Neg Hx    Rectal cancer Neg Hx    Stomach cancer Neg Hx     Prior to Admission medications  Medication Sig Start Date End Date Taking? Authorizing Provider  acetaminophen  (TYLENOL ) 325 MG tablet Take 2 tablets (650 mg total) by mouth every 6 (six) hours as needed for mild pain (pain score 1-3) or fever (or Fever >/= 101). 09/16/24   Will Almarie MATSU, MD  albuterol  (ACCUNEB ) 1.25 MG/3ML nebulizer solution Inhale 3 mL (1.25 mg total) by nebulization every 6 (six) hours as needed for wheezing. Patient taking differently: Take 1 ampule by nebulization See admin instructions. Nebulize 1.25 mg (1 ampule) and inhale into he lungs two times a day and an additional 2 times a day as needed for shortness of breath or wheezing 08/14/24   Alghanim, Cools, MD  albuterol  (VENTOLIN  HFA) 108 (90 Base) MCG/ACT inhaler Inhale 2 puffs into the lungs every 4 (four) hours as needed for wheezing or shortness of breath. 08/19/24   Jadine Toribio SQUIBB, MD  alum & mag hydroxide-simeth (MAALOX/MYLANTA) 200-200-20 MG/5ML suspension Take 15 mLs by mouth every 4 (four) hours as needed for indigestion or heartburn. 09/16/24   Will Almarie MATSU, MD  atorvastatin  (LIPITOR) 40 MG tablet Take 1 tablet (40 mg total) by mouth every evening for cholesterol 10/01/24   Henson, Vickie L, NP-C  Cholecalciferol (VITAMIN D3) 125 MCG (5000 UT)  CAPS Take 5,000 Units by mouth daily.    [provider]  CINNAMON PO Take 1-2 capsules by mouth daily.    [provider]  CLARITIN 10 MG tablet Take 10 mg by mouth daily.    [provider]  Cyanocobalamin (VITAMIN B 12 PO) Take 1 tablet by mouth daily.    [provider]  famotidine  (PEPCID ) 40 MG tablet Take 1 tablet (40 mg total) by mouth every evening to prevent heartburn and indigestion. Patient taking differently: Take 40 mg by mouth See admin instructions. Take 40 mg by mouth in the evening as needed for reflux or indigestion 06/27/24   Alvia Corean CROME, FNP  fluconazole  (DIFLUCAN ) 40 MG/ML suspension  Take 5 mLs (200 mg total) by mouth daily for 14 days. 10/11/24 10/25/24  Vu, Constance T, MD  glucose blood (ONETOUCH VERIO) test strip USE TO TEST BLOOD SUGAR ONCE DAILY 09/25/24   Henson, Vickie L, NP-C  glucose blood test strip Use 1 strip to check glucose once daily. 06/27/24   Alvia Corean CROME, FNP  Lancets [facility] ULTRASOFT) lancets Use as instructed 09/25/24   Lendia Nordmann L, NP-C  lidocaine  (XYLOCAINE ) 2 % solution Use as directed 15 mLs in the mouth or throat as needed for mouth pain. Swallow 20-30 minutes prior to meals and at bedtime 10/02/24   Shannon Agent, MD  losartan  (COZAAR ) 50 MG tablet Take 1 tablet (50 mg total) by mouth daily for blood pressure. 08/07/24   Henson, Vickie L, NP-C  metoprolol  tartrate (LOPRESSOR ) 25 MG tablet Take 0.5 tablets (12.5 mg total) by mouth daily at 2 am. 09/16/24   Will Almarie MATSU, MD  OMEGA-3 FATTY ACIDS PO Take 1 capsule by mouth 3 (three) times a week.    [provider]  potassium chloride  SA (KLOR-CON  M) 20 MEQ tablet Take 1 tablet (20 mEq total) by mouth 2 (two) times daily. 09/23/24   Heilingoetter, Cassandra L, PA-C  sucralfate  (CARAFATE ) 1 g tablet Take 1 tablet (1 g total) by mouth 4 (four) times daily -  with meals and at bedtime. 09/23/24   Heilingoetter, Cassandra L, PA-C  SYSTANE  COMPLETE PF 0.6 % SOLN Place 1 drop into both eyes 3 (three) times daily as needed (for dryness or irritation).    [provider]  tamsulosin  (FLOMAX ) 0.4 MG CAPS capsule Take 1 capsule (0.4 mg total) by mouth every evening for prostate. 06/27/24   Alvia Corean CROME, FNP  zinc gluconate 50 MG tablet Take 50 mg by mouth daily.    [provider]    Physical Exam: Vitals:   10/18/24 1345 10/18/24 1400 10/18/24 1529 10/18/24 1600  BP: (!) 96/51 (!) 98/57 97/74 109/79  Pulse: (!) 35 (!) 52 68 79  Resp: 19 (!) 22 20 (!) 24  Temp:   98.4 F (36.9 C)   TempSrc:   Oral   SpO2: 94% 98% 97%   Weight:      Height:       Physical Exam:  General: No acute distress, cachectic, frail, chronically ill HEENT: Normocephalic, atraumatic, PERRL Cardiovascular: Normal rate and irregular rhythm. Distal pulses intact. Pulmonary: Normal pulmonary effort, right lung cta w good air movement.  Left lung w poor air movement,  Gastrointestinal: Nondistended abdomen, soft, non-tender, normoactive bowel sounds Musculoskeletal:Normal ROM,  lower ext edema and swelling of feet,  Skin: Skin is warm and dry. Neuro: No focal deficits noted, AAOx3. PSYCH: Attentive and cooperative  Data Reviewed:  Results for orders placed or performed during the hospital encounter of 10/18/24 (from the past 24 hours)  Comprehensive metabolic panel     Status: Abnormal   Collection Time: 10/18/24  1:23 PM  Result Value Ref Range   Sodium 137 135 - 145 mmol/L   Potassium 3.1 (L) 3.5 - 5.1 mmol/L   Chloride 100 98 - 111 mmol/L   CO2 25 22 - 32 mmol/L   Glucose, Bld 194 (H) 70 - 99 mg/dL   BUN 27 (H) 8 - 23 mg/dL   Creatinine, Ser 8.41 (H) 0.61 - 1.24 mg/dL   Calcium  8.2 (L) 8.9 - 10.3 mg/dL   Total Protein 5.6 (L) 6.5 - 8.1 g/dL   Albumin  2.8 (L)  3.5 - 5.0 g/dL   AST 23 15 - 41 U/L   ALT 9 0 - 44 U/L   Alkaline Phosphatase 62 38 - 126 U/L   Total Bilirubin 0.7 0.0 - 1.2 mg/dL   GFR, Estimated 43 (L)  >60 mL/min   Anion gap 12 5 - 15  CBC     Status: Abnormal   Collection Time: 10/18/24  1:23 PM  Result Value Ref Range   WBC 5.3 4.0 - 10.5 K/uL   RBC 3.20 (L) 4.22 - 5.81 MIL/uL   Hemoglobin 9.2 (L) 13.0 - 17.0 g/dL   HCT 70.8 (L) 60.9 - 47.9 %   MCV 90.9 80.0 - 100.0 fL   MCH 28.8 26.0 - 34.0 pg   MCHC 31.6 30.0 - 36.0 g/dL   RDW 82.2 (H) 88.4 - 84.4 %   Platelets 166 150 - 400 K/uL   nRBC 0.0 0.0 - 0.2 %  Protime-INR     Status: Abnormal   Collection Time: 10/18/24  1:23 PM  Result Value Ref Range   Prothrombin Time 15.9 (H) 11.4 - 15.2 seconds   INR 1.2 0.8 - 1.2  CBG monitoring, ED     Status: Abnormal   Collection Time: 10/18/24  1:34 PM  Result Value Ref Range   Glucose-Capillary 153 (H) 70 - 99 mg/dL  I-Stat Lactic Acid, ED     Status: Abnormal   Collection Time: 10/18/24  1:34 PM  Result Value Ref Range   Lactic Acid, Venous 2.8 (HH) 0.5 - 1.9 mmol/L   Comment NOTIFIED PHYSICIAN   I-Stat Lactic Acid, ED     Status: None   Collection Time: 10/18/24  3:30 PM  Result Value Ref Range   Lactic Acid, Venous 1.7 0.5 - 1.9 mmol/L   CT chest, abdomen, pelvis IMPRESSION: 1. No acute traumatic injury to the chest, abdomen or pelvis. 2. Redemonstration of right inferior hilar mass with complete occlusion of the middle lobe bronchus and atelectatic changes in the right lower lobe. There is small-to-moderate right pleural effusion, grossly similar to the prior study. 3. There is mild-to-moderate circumferential thickening of the entire colon with associated at least mild pericolonic fat stranding, compatible with pancolitis. Findings may be infective/inflammatory in etiology. Ischemic etiology is less likely considering the distribution pattern. 4. Multiple other nonacute observations (such as stable left paratracheal soft tissue/lymph node, at least 2 subcentimeter sized hypoattenuating structures in the liver, bilateral renal sinus cysts, right renal exophytic cortical  cyst, 2 dependent urinary bladder calculi measuring up to 8 mm, small ascites-likely reactive, enlarged prostate gland, bilateral L5 spondylolysis, etc.), As described above.  C spine CT IMPRESSION: 1. No acute fracture or traumatic malalignment of the cervical spine. 2. Partially visualized right pleural effusion with subjacent atelectasis. 3. Hypoattenuating/cystic focus in the right vallecula measuring 1.9 x 1.2 cm, recommend direct inspection.   Assessment and Plan:   New onset atrial fibrillation - Heart rate is controlled after 1 dose of IV Lopressor  2.5mg  in the ED. His bp does not allow ongoing beta blocker. - telemetry - Follows with [facility] cardiology as an outpatient. Consult them in am. - Given his frail status I am hesitant to put him on a blood thinner at this time. - Echo. Last Echo was 2 months ago  - replace electrolytes   2. Hypotension / syncope -The patient's hypotension is felt to be secondary to volume depletion from chronic diarrhea and poor appetite.   - Continue IV fluids.  He received  12.5gm  of albumin  in the emergency department also. - Consider adding midodrine  - Replace potassium - No fractures or significant injury found secondary to the syncopal episode  3. Chronic diarrhea - His last infectious disease note indicates that it is not felt that his diarrhea is from active C. difficile.  The diarrhea is huge detriment to the patient's quality of life.  Consider ID consult.  If this is not C. difficile is he a candidate for cholestyramine or other routine antidiarrheal agents?  4. Hypokalemia - Replete.  Check magnesium  levels  5. Neuroendocrine lung cancer - consult his oncologist in a.m. He is in active chemotherapy but his poor functional status may lead to change in plans.  6. FTT/unintentional weight loss -  He is seeing palliative care for symptom management as an outpatient.  7. 1.9 x 1.2cm mass near epiglottis - ENT consult recommended    Advance Care Planning:   Code Status: Limited: Do not attempt resuscitation (DNR) -DNR-LIMITED -Do Not Intubate/DNI   The patient has a MOST form log in the chart which states that he is DNR/DNI.  His wife is his surrogate decision maker as noted in his recent palliative care visit. Consults: None yet Family Communication: None  Severity of Illness: The appropriate patient status for this patient is INPATIENT. Inpatient status is judged to be reasonable and necessary in order to provide the required intensity of service to ensure the patient's safety. The patient's presenting symptoms, physical exam findings, and initial radiographic and laboratory data in the context of their chronic comorbidities is felt to place them at high risk for further clinical deterioration. Furthermore, it is not anticipated that the patient will be medically stable for discharge from the hospital within 2 midnights of admission.   * I certify that at the point of admission it is my clinical judgment that the patient will require inpatient hospital care spanning beyond 2 midnights from the point of admission due to high intensity of service, high risk for further deterioration and high frequency of surveillance required.*  Author: ARTHEA CHILD, MD 10/18/2024 5:07 PM  For on call review [email or web address].      [1]  Allergies Allergen Reactions   Bystolic [Nebivolol Hcl] Other (See Comments)    Reaction not recalled   "

## 2024-10-18 NOTE — ED Notes (Signed)
 Unable to obtain second set of blood culture, this RN notified attending Mannie, ok to just obtain one.

## 2024-10-18 NOTE — ED Triage Notes (Addendum)
 Pt home nurse reports that he possible has a re-infection of c-diff, pt reports that his diarrhea re-started this morning since a few days ago.  Nurse also reports hypotension and that BP were discontinued because of this. She is also concerned for edema and electrolyte imbalance.   Pt denies , endorses mild SOB, denies n/v. Pt hypotensive in triage at 88/54, Lung Ca pt

## 2024-10-18 NOTE — ED Notes (Signed)
 Pt to CT

## 2024-10-18 NOTE — ED Notes (Signed)
 EDP made aware of pt HR

## 2024-10-18 NOTE — ED Notes (Signed)
 Pt reports fell aprox 2 hours ago , reports felt dizzy resulting in fall , reports did hit head, small hematoma noted to right forehead. Reports not on blood thinner. Denies LOC

## 2024-10-21 ENCOUNTER — Inpatient Hospital Stay

## 2024-10-25 ENCOUNTER — Ambulatory Visit (HOSPITAL_COMMUNITY)

## 2024-10-28 ENCOUNTER — Inpatient Hospital Stay

## 2024-10-29 ENCOUNTER — Encounter: Admitting: Thoracic Surgery (Cardiothoracic Vascular Surgery)

## 2024-10-30 ENCOUNTER — Inpatient Hospital Stay: Admitting: Physician Assistant

## 2024-10-30 ENCOUNTER — Inpatient Hospital Stay

## 2024-10-31 ENCOUNTER — Inpatient Hospital Stay

## 2024-10-31 ENCOUNTER — Inpatient Hospital Stay: Admitting: Physician Assistant

## 2024-11-01 ENCOUNTER — Inpatient Hospital Stay

## 2024-11-04 ENCOUNTER — Inpatient Hospital Stay

## 2024-11-05 ENCOUNTER — Encounter: Admitting: Thoracic Surgery (Cardiothoracic Vascular Surgery)

## 2024-11-05 ENCOUNTER — Inpatient Hospital Stay

## 2024-11-05 ENCOUNTER — Ambulatory Visit: Admitting: Radiology

## 2024-11-06 ENCOUNTER — Inpatient Hospital Stay

## 2024-11-06 ENCOUNTER — Inpatient Hospital Stay: Admitting: Physician Assistant

## 2024-11-07 ENCOUNTER — Inpatient Hospital Stay

## 2024-11-08 ENCOUNTER — Inpatient Hospital Stay

## 2024-11-08 ENCOUNTER — Ambulatory Visit (HOSPITAL_COMMUNITY)

## 2024-11-11 ENCOUNTER — Inpatient Hospital Stay

## 2024-11-13 ENCOUNTER — Ambulatory Visit (INDEPENDENT_AMBULATORY_CARE_PROVIDER_SITE_OTHER): Admitting: Otolaryngology

## 2024-11-14 ENCOUNTER — Ambulatory Visit: Payer: Self-pay | Admitting: Internal Medicine

## 2024-11-20 ENCOUNTER — Ambulatory Visit: Admitting: Family Medicine

## 2024-11-26 ENCOUNTER — Ambulatory Visit (HOSPITAL_BASED_OUTPATIENT_CLINIC_OR_DEPARTMENT_OTHER): Admitting: Pulmonary Disease

## 2024-12-25 ENCOUNTER — Ambulatory Visit: Admitting: Family Medicine

## 2025-02-10 ENCOUNTER — Ambulatory Visit
